# Patient Record
Sex: Male | Born: 1950 | Race: Black or African American | Hispanic: No | Marital: Married | Smoking: Former smoker
Health system: Southern US, Community
[De-identification: ages and names within clinical notes are randomized; demographics above are authoritative.]

## PROBLEM LIST (undated history)

## (undated) DIAGNOSIS — L910 Hypertrophic scar: Secondary | ICD-10-CM

## (undated) DIAGNOSIS — I10 Essential (primary) hypertension: Secondary | ICD-10-CM

## (undated) DIAGNOSIS — I739 Peripheral vascular disease, unspecified: Secondary | ICD-10-CM

## (undated) DIAGNOSIS — D489 Neoplasm of uncertain behavior, unspecified: Secondary | ICD-10-CM

## (undated) DIAGNOSIS — I509 Heart failure, unspecified: Secondary | ICD-10-CM

## (undated) DIAGNOSIS — N189 Chronic kidney disease, unspecified: Secondary | ICD-10-CM

## (undated) DIAGNOSIS — Z992 Dependence on renal dialysis: Secondary | ICD-10-CM

## (undated) DIAGNOSIS — I219 Acute myocardial infarction, unspecified: Secondary | ICD-10-CM

## (undated) DIAGNOSIS — R809 Proteinuria, unspecified: Secondary | ICD-10-CM

## (undated) DIAGNOSIS — E119 Type 2 diabetes mellitus without complications: Secondary | ICD-10-CM

## (undated) DIAGNOSIS — I129 Hypertensive chronic kidney disease with stage 1 through stage 4 chronic kidney disease, or unspecified chronic kidney disease: Secondary | ICD-10-CM

## (undated) DIAGNOSIS — A0472 Enterocolitis due to Clostridium difficile, not specified as recurrent: Secondary | ICD-10-CM

## (undated) DIAGNOSIS — I639 Cerebral infarction, unspecified: Secondary | ICD-10-CM

## (undated) DIAGNOSIS — E559 Vitamin D deficiency, unspecified: Secondary | ICD-10-CM

## (undated) DIAGNOSIS — I2510 Atherosclerotic heart disease of native coronary artery without angina pectoris: Secondary | ICD-10-CM

## (undated) DIAGNOSIS — N529 Male erectile dysfunction, unspecified: Secondary | ICD-10-CM

## (undated) DIAGNOSIS — N289 Disorder of kidney and ureter, unspecified: Secondary | ICD-10-CM

## (undated) DIAGNOSIS — E785 Hyperlipidemia, unspecified: Secondary | ICD-10-CM

## (undated) HISTORY — PX: CORONARY ANGIOPLASTY: SHX604

## (undated) HISTORY — DX: Hypertrophic scar: L91.0

## (undated) HISTORY — DX: Atherosclerotic heart disease of native coronary artery without angina pectoris: I25.10

## (undated) HISTORY — DX: Chronic kidney disease, unspecified: N18.9

## (undated) HISTORY — DX: Neoplasm of uncertain behavior, unspecified: D48.9

## (undated) HISTORY — DX: Hyperlipidemia, unspecified: E78.5

## (undated) HISTORY — DX: Type 2 diabetes mellitus without complications: E11.9

## (undated) HISTORY — DX: Peripheral vascular disease, unspecified: I73.9

## (undated) HISTORY — DX: Male erectile dysfunction, unspecified: N52.9

## (undated) HISTORY — DX: Hypertensive chronic kidney disease with stage 1 through stage 4 chronic kidney disease, or unspecified chronic kidney disease: I12.9

## (undated) HISTORY — DX: Proteinuria, unspecified: R80.9

## (undated) HISTORY — DX: Vitamin D deficiency, unspecified: E55.9

## (undated) HISTORY — PX: OTHER SURGICAL HISTORY: SHX169

## (undated) HISTORY — DX: Essential (primary) hypertension: I10

---

## 2004-06-11 DIAGNOSIS — I219 Acute myocardial infarction, unspecified: Secondary | ICD-10-CM

## 2004-06-11 HISTORY — DX: Acute myocardial infarction, unspecified: I21.9

## 2004-06-11 HISTORY — PX: CORONARY ARTERY BYPASS GRAFT: SHX141

## 2004-12-09 HISTORY — PX: CARDIAC CATHETERIZATION: SHX172

## 2004-12-26 ENCOUNTER — Inpatient Hospital Stay: Payer: Self-pay | Admitting: Internal Medicine

## 2004-12-27 ENCOUNTER — Inpatient Hospital Stay (HOSPITAL_COMMUNITY)
Admission: AD | Admit: 2004-12-27 | Discharge: 2005-01-08 | Payer: Self-pay | Admitting: Thoracic Surgery (Cardiothoracic Vascular Surgery)

## 2005-01-15 ENCOUNTER — Encounter
Admission: RE | Admit: 2005-01-15 | Discharge: 2005-01-15 | Payer: Self-pay | Admitting: Thoracic Surgery (Cardiothoracic Vascular Surgery)

## 2005-01-30 ENCOUNTER — Ambulatory Visit: Payer: Self-pay

## 2005-01-30 ENCOUNTER — Encounter: Payer: Self-pay | Admitting: Internal Medicine

## 2005-02-09 ENCOUNTER — Encounter: Payer: Self-pay | Admitting: Internal Medicine

## 2005-02-09 ENCOUNTER — Ambulatory Visit: Payer: Self-pay

## 2005-03-11 ENCOUNTER — Ambulatory Visit: Payer: Self-pay

## 2005-03-11 ENCOUNTER — Encounter: Payer: Self-pay | Admitting: Internal Medicine

## 2005-04-11 ENCOUNTER — Encounter: Payer: Self-pay | Admitting: Internal Medicine

## 2005-05-11 ENCOUNTER — Encounter: Payer: Self-pay | Admitting: Internal Medicine

## 2005-10-30 ENCOUNTER — Ambulatory Visit: Payer: Self-pay

## 2008-09-09 HISTORY — PX: CARDIAC CATHETERIZATION: SHX172

## 2008-10-05 ENCOUNTER — Ambulatory Visit: Payer: Self-pay | Admitting: Internal Medicine

## 2008-10-07 ENCOUNTER — Ambulatory Visit: Payer: Self-pay | Admitting: Vascular Surgery

## 2009-07-13 ENCOUNTER — Ambulatory Visit: Payer: Self-pay | Admitting: Internal Medicine

## 2010-06-19 DIAGNOSIS — R809 Proteinuria, unspecified: Secondary | ICD-10-CM | POA: Insufficient documentation

## 2010-06-19 DIAGNOSIS — E119 Type 2 diabetes mellitus without complications: Secondary | ICD-10-CM | POA: Insufficient documentation

## 2010-07-02 ENCOUNTER — Encounter: Payer: Self-pay | Admitting: Thoracic Surgery (Cardiothoracic Vascular Surgery)

## 2012-04-03 ENCOUNTER — Encounter: Payer: Self-pay | Admitting: *Deleted

## 2012-04-10 ENCOUNTER — Ambulatory Visit (INDEPENDENT_AMBULATORY_CARE_PROVIDER_SITE_OTHER): Payer: Medicare Other | Admitting: Cardiovascular Disease

## 2012-04-10 ENCOUNTER — Encounter: Payer: Self-pay | Admitting: Cardiovascular Disease

## 2012-04-10 VITALS — BP 132/84 | HR 63 | Ht 72.0 in | Wt 264.8 lb

## 2012-04-10 DIAGNOSIS — E785 Hyperlipidemia, unspecified: Secondary | ICD-10-CM

## 2012-04-10 DIAGNOSIS — I10 Essential (primary) hypertension: Secondary | ICD-10-CM

## 2012-04-10 DIAGNOSIS — I2510 Atherosclerotic heart disease of native coronary artery without angina pectoris: Secondary | ICD-10-CM | POA: Insufficient documentation

## 2012-04-10 DIAGNOSIS — R079 Chest pain, unspecified: Secondary | ICD-10-CM

## 2012-04-10 DIAGNOSIS — R0602 Shortness of breath: Secondary | ICD-10-CM

## 2012-04-10 DIAGNOSIS — I739 Peripheral vascular disease, unspecified: Secondary | ICD-10-CM

## 2012-04-10 NOTE — Assessment & Plan Note (Signed)
I recommend a target LDL of less than 70. He is currently on Crestor 20 mg daily.

## 2012-04-10 NOTE — Progress Notes (Signed)
HPI  This is a 61 year old African American male who is here today to establish cardiovascular care. He use to followup with Dr. Gwen Pounds that he is requesting to transfer care. He has known history of coronary artery disease status post non-ST elevation myocardial infarction in 2006. Cardiac catheterization at that time showed significant three-vessel coronary artery disease. He underwent coronary artery bypass graft surgery. Most recent cardiac catheterization in 2010 showed patent grafts except SVG to diagonal which was occluded. Ejection fraction was normal. The patient has multiple other medical conditions that include hypertension, stage IV chronic kidney disease, diabetes, hyperlipidemia and obesity. His been having occasional chest pain described as sharp discomfort and occasional tightness mostly at rest and not with physical activities. The patient does not exercise on a regular basis. He takes his medications regularly. He has not had any ischemic cardiac evaluation since 2010.  No Known Allergies   Current Outpatient Prescriptions on File Prior to Visit  Medication Sig Dispense Refill  . amLODipine (NORVASC) 10 MG tablet Take 10 mg by mouth daily.      Marland Kitchen aspirin 81 MG tablet Take 81 mg by mouth daily.      . chlorthalidone (HYGROTON) 25 MG tablet Take 25 mg by mouth daily.      . Cholecalciferol (VITAMIN D3) 50000 UNITS CAPS Take by mouth every 30 (thirty) days.      . insulin glargine (LANTUS) 100 UNIT/ML injection Inject into the skin. Takes 80 units at bedtimed;Titrate 4 units every 4 days unitl FBG are in the 130s.      . Insulin Pen Needle (PRODIGY MINI PEN NEEDLES) 31G X 5 MM MISC by Does not apply route as directed.      Marland Kitchen lisinopril (PRINIVIL,ZESTRIL) 40 MG tablet Take 40 mg by mouth daily.      . metoprolol (LOPRESSOR) 100 MG tablet Take 100 mg by mouth 2 (two) times daily.      . rosuvastatin (CRESTOR) 20 MG tablet Take 20 mg by mouth daily.      . sildenafil (VIAGRA) 50  MG tablet Takes 1-2 tablets as needed.         Past Medical History  Diagnosis Date  . Diabetes mellitus without complication     type I  . Unspecified vitamin D deficiency   . Neoplasm of uncertain behavior, site unspecified   . Peripheral vascular disease, unspecified   . Impotence of organic origin   . Keloid scar   . Proteinuria   . Coronary atherosclerosis of unspecified type of vessel, native or graft     Myocardial infarction in 2006. Cardiac catheterization showed significant three-vessel coronary artery disease. He underwent CABG at [facility]. Most recent cardiac catheterization in 2010 showed normal LV systolic function, patent grafts to OM1, RCA and LAD. Occluded SVG to diagonal.  . Hyperlipidemia   . Hypertension   . Hypertension, renal disease   . CKD (chronic kidney disease)      Past Surgical History  Procedure Date  . Keloid removal     right neck  . Cardiac catheterization 12/2004    [facility]  . Cardiac catheterization 09/2008    [facility]  . Coronary artery bypass graft 2006    [facility]     Family History  Problem Relation Age of Onset  . Hypertension Mother   . Hyperlipidemia Mother   . Heart disease Mother   . Heart disease Father   . Hyperlipidemia Father   . Hypertension Father  History   Social History  . Marital Status: Married    Spouse Name: N/A    Number of Children: N/A  . Years of Education: N/A   Occupational History  . Not on file.   Social History Main Topics  . Smoking status: Former Smoker -- 0.5 packs/day for 0 years    Types: Cigarettes  . Smokeless tobacco: Not on file  . Alcohol Use: Yes     socially  . Drug Use: No  . Sexually Active:    Other Topics Concern  . Not on file   Social History Narrative  . No narrative on file     ROS Constitutional: Negative for fever, chills, diaphoresis, activity change, appetite change and fatigue.  HENT: Negative for hearing loss, nosebleeds, congestion, sore  throat, facial swelling, drooling, trouble swallowing, neck pain, voice change, sinus pressure and tinnitus.  Eyes: Negative for photophobia, pain, discharge and visual disturbance.  Respiratory: Negative for apnea, cough and wheezing.  Cardiovascular: Negative for palpitations and leg swelling.  Gastrointestinal: Negative for nausea, vomiting, abdominal pain, diarrhea, constipation, blood in stool and abdominal distention.  Genitourinary: Negative for dysuria, urgency, frequency, hematuria and decreased urine volume.  Musculoskeletal: Negative for myalgias, back pain, joint swelling, arthralgias and gait problem.  Skin: Negative for color change, pallor, rash and wound.  Neurological: Negative for dizziness, tremors, seizures, syncope, speech difficulty, weakness, light-headedness, numbness and headaches.  Psychiatric/Behavioral: Negative for suicidal ideas, hallucinations, behavioral problems and agitation. The patient is not nervous/anxious.     PHYSICAL EXAM   BP 132/84  Pulse 63  Ht 6' (1.829 m)  Wt 264 lb 12 oz (120.09 kg)  BMI 35.91 kg/m2 Constitutional: He is oriented to person, place, and time. He appears well-developed and well-nourished. No distress.  HENT: No nasal discharge.  Head: Normocephalic and atraumatic.  Eyes: Pupils are equal and round. Right eye exhibits no discharge. Left eye exhibits no discharge.  Neck: Normal range of motion. Neck supple. No JVD present. No thyromegaly present.  Cardiovascular: Normal rate, regular rhythm, normal heart sounds and. Exam reveals no gallop and no friction rub. No murmur heard. Significant keloid tissue at the surgical scar. Pulmonary/Chest: Effort normal and breath sounds normal. No stridor. No respiratory distress. He has no wheezes. He has no rales. He exhibits no tenderness.  Abdominal: Soft. Bowel sounds are normal. He exhibits no distension. There is no tenderness. There is no rebound and no guarding.  Musculoskeletal: Normal  range of motion. He exhibits +1 edema and no tenderness.  Neurological: He is alert and oriented to person, place, and time. Coordination normal.  Skin: Skin is warm and dry. No rash noted. He is not diaphoretic. No erythema. No pallor.  Psychiatric: He has a normal mood and affect. His behavior is normal. Judgment and thought content normal.       [id number]:[id number]  Rhythm  -Nonspecific QRS widening and anterior fascicular block.   -Nonspecific ST depression   +   Nonspecific T-abnormality  -Nondiagnostic.   ABNORMAL    ASSESSMENT AND PLAN

## 2012-04-10 NOTE — Patient Instructions (Addendum)
Your physician has requested that you have a lexiscan myoview. For further information please visit [email or web address] Please follow instruction sheet, as given.  Follow up after stress test

## 2012-04-10 NOTE — Assessment & Plan Note (Addendum)
The patient has no history of coronary artery disease status post CABG. Currently he complains of atypical chest pain. He also has multiple chronic medical conditions. I recommend further evaluation with a pharmacologic nuclear stress test. The patient is not able to exercise on a treadmill due to his peripheral arterial disease and leg discomfort. Continue aggressive medical therapy. Given his advanced chronic kidney disease, cardiac catheterization would be reserved only for highly abnormal stress test.

## 2012-04-10 NOTE — Assessment & Plan Note (Signed)
He reports previous stenting in the right lower extremity without improvement in symptoms. He is being followed by Dr. Wyn Quaker.

## 2012-04-10 NOTE — Assessment & Plan Note (Signed)
His blood pressure is well controlled. Continue current medications. 

## 2012-04-14 ENCOUNTER — Telehealth: Payer: Self-pay | Admitting: Cardiovascular Disease

## 2012-04-14 NOTE — Telephone Encounter (Signed)
Pt wants to cancel stress test for 11/7

## 2012-04-14 NOTE — Telephone Encounter (Signed)
Pt wishes to cancel lexi Says he is seeing Dr. Gwen Pounds as well and is going to continue seeing him versus Dr. Kirke Corin Will cx stress test

## 2012-04-21 ENCOUNTER — Ambulatory Visit: Payer: Medicare Other | Admitting: Cardiovascular Disease

## 2012-08-28 DIAGNOSIS — E669 Obesity, unspecified: Secondary | ICD-10-CM | POA: Insufficient documentation

## 2013-03-30 DIAGNOSIS — D649 Anemia, unspecified: Secondary | ICD-10-CM | POA: Insufficient documentation

## 2013-03-30 DIAGNOSIS — E559 Vitamin D deficiency, unspecified: Secondary | ICD-10-CM | POA: Insufficient documentation

## 2014-07-24 DIAGNOSIS — I340 Nonrheumatic mitral (valve) insufficiency: Secondary | ICD-10-CM | POA: Insufficient documentation

## 2014-07-24 DIAGNOSIS — I6529 Occlusion and stenosis of unspecified carotid artery: Secondary | ICD-10-CM | POA: Insufficient documentation

## 2014-07-24 DIAGNOSIS — I119 Hypertensive heart disease without heart failure: Secondary | ICD-10-CM | POA: Insufficient documentation

## 2014-07-24 DIAGNOSIS — I6523 Occlusion and stenosis of bilateral carotid arteries: Secondary | ICD-10-CM | POA: Insufficient documentation

## 2014-12-14 ENCOUNTER — Encounter: Payer: Self-pay | Admitting: Emergency Medicine

## 2014-12-14 ENCOUNTER — Emergency Department
Admission: EM | Admit: 2014-12-14 | Discharge: 2014-12-14 | Disposition: A | Discharge status: Home / Self Care | Payer: Medicare HMO | Attending: Emergency Medicine | Admitting: Emergency Medicine

## 2014-12-14 DIAGNOSIS — I129 Hypertensive chronic kidney disease with stage 1 through stage 4 chronic kidney disease, or unspecified chronic kidney disease: Secondary | ICD-10-CM | POA: Insufficient documentation

## 2014-12-14 DIAGNOSIS — Z79899 Other long term (current) drug therapy: Secondary | ICD-10-CM | POA: Diagnosis not present

## 2014-12-14 DIAGNOSIS — Z7982 Long term (current) use of aspirin: Secondary | ICD-10-CM | POA: Insufficient documentation

## 2014-12-14 DIAGNOSIS — Z794 Long term (current) use of insulin: Secondary | ICD-10-CM | POA: Diagnosis not present

## 2014-12-14 DIAGNOSIS — E119 Type 2 diabetes mellitus without complications: Secondary | ICD-10-CM | POA: Diagnosis not present

## 2014-12-14 DIAGNOSIS — N189 Chronic kidney disease, unspecified: Secondary | ICD-10-CM | POA: Diagnosis not present

## 2014-12-14 DIAGNOSIS — M10361 Gout due to renal impairment, right knee: Secondary | ICD-10-CM | POA: Diagnosis not present

## 2014-12-14 DIAGNOSIS — Z87891 Personal history of nicotine dependence: Secondary | ICD-10-CM | POA: Insufficient documentation

## 2014-12-14 DIAGNOSIS — M25561 Pain in right knee: Secondary | ICD-10-CM | POA: Diagnosis present

## 2014-12-14 LAB — BASIC METABOLIC PANEL
Anion gap: 10 (ref 5–15)
BUN: 41 mg/dL — AB (ref 6–20)
CALCIUM: 9.2 mg/dL (ref 8.9–10.3)
CO2: 23 mmol/L (ref 22–32)
Chloride: 106 mmol/L (ref 101–111)
Creatinine, Ser: 5.01 mg/dL — ABNORMAL HIGH (ref 0.61–1.24)
GFR, EST AFRICAN AMERICAN: 13 mL/min — AB (ref >60)
GFR, EST NON AFRICAN AMERICAN: 11 mL/min — AB (ref >60)
Glucose, Bld: 257 mg/dL — ABNORMAL HIGH (ref 65–99)
Potassium: 3.9 mmol/L (ref 3.5–5.1)
Sodium: 139 mmol/L (ref 135–145)

## 2014-12-14 LAB — CBC WITH DIFFERENTIAL/PLATELET
BASOS ABS: 0.1 10*3/uL (ref 0–0.1)
BASOS PCT: 1 %
EOS PCT: 2 %
Eosinophils Absolute: 0.2 10*3/uL (ref 0–0.7)
HEMATOCRIT: 36.2 % — AB (ref 40.0–52.0)
Hemoglobin: 11.8 g/dL — ABNORMAL LOW (ref 13.0–18.0)
Lymphocytes Relative: 15 %
Lymphs Abs: 1.3 10*3/uL (ref 1.0–3.6)
MCH: 27.1 pg (ref 26.0–34.0)
MCHC: 32.7 g/dL (ref 32.0–36.0)
MCV: 82.7 fL (ref 80.0–100.0)
MONO ABS: 0.5 10*3/uL (ref 0.2–1.0)
MONOS PCT: 6 %
Neutro Abs: 6.4 10*3/uL (ref 1.4–6.5)
Neutrophils Relative %: 76 %
Platelets: 198 10*3/uL (ref 150–440)
RBC: 4.37 MIL/uL — ABNORMAL LOW (ref 4.40–5.90)
RDW: 14.3 % (ref 11.5–14.5)
WBC: 8.4 10*3/uL (ref 3.8–10.6)

## 2014-12-14 LAB — URIC ACID: URIC ACID, SERUM: 7.1 mg/dL (ref 4.4–7.6)

## 2014-12-14 MED ORDER — HYDROCODONE-ACETAMINOPHEN 5-325 MG PO TABS: 1.0000 | ORAL_TABLET | Freq: Four times a day (QID) | ORAL | Status: DC | PRN

## 2014-12-14 NOTE — ED Provider Notes (Signed)
[facility] Emergency Department Provider Note ____________________________________________  Time seen: 1524  I have reviewed the triage vital signs and the nursing notes.  HISTORY  Chief Complaint  Knee Pain  HPI Terry Tyler is a 64 y.o. male visits to the ED for evaluation and management of sudden onset of right knee pain. He notes the onset was on Saturday, denies any trauma including fall, near fall, or previous history of knee pain. He rates his pain at a 10 out of 10 currently, and has not taken any medications for relief since onset. His past medical history significant for hypertension, MI, and chronic kidney disease. He denies a history of gout, rheumatoid, or OA.  Past Medical History  Diagnosis Date  . Diabetes mellitus without complication     type I  . Unspecified vitamin D deficiency   . Neoplasm of uncertain behavior, site unspecified   . Peripheral vascular disease, unspecified   . Impotence of organic origin   . Keloid scar   . Proteinuria   . Coronary atherosclerosis of unspecified type of vessel, native or graft     Myocardial infarction in 2006. Cardiac catheterization showed significant three-vessel coronary artery disease. He underwent CABG at [facility]. Most recent cardiac catheterization in 2010 showed normal LV systolic function, patent grafts to OM1, RCA and LAD. Occluded SVG to diagonal.  . Hyperlipidemia   . Hypertension   . Hypertension, renal disease   . CKD (chronic kidney disease)     Patient Active Problem List   Diagnosis Date Noted  . PAD (peripheral artery disease) 04/10/2012  . Coronary atherosclerosis of unspecified type of vessel, native or graft   . Hyperlipidemia   . Hypertension     Past Surgical History  Procedure Laterality Date  . Keloid removal      right neck  . Cardiac catheterization  12/2004    [facility]  . Cardiac catheterization  09/2008    [facility]  . Coronary artery bypass  graft  2006    [facility]    Current Outpatient Rx  Name  Route  Sig  Dispense  Refill  . amLODipine (NORVASC) 10 MG tablet   Oral   Take 10 mg by mouth daily.         Marland Kitchen aspirin 81 MG tablet   Oral   Take 81 mg by mouth daily.         . chlorthalidone (HYGROTON) 25 MG tablet   Oral   Take 25 mg by mouth daily.         . Cholecalciferol (VITAMIN D3) 50000 UNITS CAPS   Oral   Take by mouth every 30 (thirty) days.         Marland Kitchen HYDROcodone-acetaminophen (NORCO) 5-325 MG per tablet   Oral   Take 1-2 tablets by mouth every 6 (six) hours as needed for moderate pain.   15 tablet   0   . insulin glargine (LANTUS) 100 UNIT/ML injection   Subcutaneous   Inject into the skin. Takes 80 units at bedtimed;Titrate 4 units every 4 days unitl FBG are in the 130s.         . Insulin Pen Needle (PRODIGY MINI PEN NEEDLES) 31G X 5 MM MISC   Does not apply   by Does not apply route as directed.         Marland Kitchen lisinopril (PRINIVIL,ZESTRIL) 40 MG tablet   Oral   Take 40 mg by mouth daily.         Marland Kitchen  metoprolol (LOPRESSOR) 100 MG tablet   Oral   Take 100 mg by mouth 2 (two) times daily.         . rosuvastatin (CRESTOR) 20 MG tablet   Oral   Take 20 mg by mouth daily.         . sildenafil (VIAGRA) 50 MG tablet      Takes 1-2 tablets as needed.          Allergies Review of patient's allergies indicates no known allergies.  Family History  Problem Relation Age of Onset  . Hypertension Mother   . Hyperlipidemia Mother   . Heart disease Mother   . Heart disease Father   . Hyperlipidemia Father   . Hypertension Father     Social History History  Substance Use Topics  . Smoking status: Former Smoker -- 0.50 packs/day for 0 years    Types: Cigarettes  . Smokeless tobacco: Not on file  . Alcohol Use: No     Comment: socially   Review of Systems  Constitutional: Negative for fever. Eyes: Negative for visual changes. ENT: Negative for sore throat. Cardiovascular:  Negative for chest pain. Respiratory: Negative for shortness of breath. Gastrointestinal: Negative for abdominal pain, vomiting and diarrhea. Genitourinary: Negative for dysuria. Musculoskeletal: Negative for back pain. Right knee pain as above. Skin: Negative for rash. Neurological: Negative for headaches, focal weakness or numbness. ____________________________________________  PHYSICAL EXAM:  VITAL SIGNS: ED Triage Vitals  Enc Vitals Group     BP 12/14/14 1453 178/103 mmHg     Pulse Rate 12/14/14 1453 87     Resp 12/14/14 1453 18     Temp 12/14/14 1453 98.3 F (36.8 C)     Temp Source 12/14/14 1453 Oral     SpO2 12/14/14 1453 100 %     Weight 12/14/14 1453 260 lb (117.935 kg)     Height 12/14/14 1453 6' (1.829 m)     Head Cir --      Peak Flow --      Pain Score 12/14/14 1455 10     Pain Loc --      Pain Edu? --      Excl. in [place]? --    Constitutional: Alert and oriented. Well appearing and in no distress. HEENT: Normocephalic and atraumatic. Conjunctivae are normal. PERRL. Normal extraocular movements. Mucous membranes are moist. Cardiovascular: Normal rate, regular rhythm. Normal distal pulses. Respiratory: Normal respiratory effort. No wheezes/rales/rhonchi. Gastrointestinal: Soft and nontender. No distention. Musculoskeletal: Right knee without deformity, but noticeable erythema, warmth, and decreased AROM. No calf or achilles tenderness. No effusion or patella laxity. Neurologic:  Normal gait without ataxia. Normal speech and language. No gross focal neurologic deficits are appreciated. Skin:  Skin is warm, dry and intact. No rash noted. Psychiatric: Mood and affect are normal. Patient exhibits appropriate insight and judgment. ____________________________________________    LABS (pertinent positives/negatives) Labs Reviewed  CBC WITH DIFFERENTIAL/PLATELET - Abnormal; Notable for the following:    RBC 4.37 (*)    Hemoglobin 11.8 (*)    HCT 36.2 (*)    All other  components within normal limits  BASIC METABOLIC PANEL - Abnormal; Notable for the following:    Glucose, Bld 257 (*)    BUN 41 (*)    Creatinine, Ser 5.01 (*)    GFR calc non Af Amer 11 (*)    GFR calc Af Amer 13 (*)    All other components within normal limits  URIC ACID  ____________________________________________  INITIAL IMPRESSION /  ASSESSMENT AND PLAN / ED COURSE  Lab results to patient, high normal uric acid noted. Suggest treatment with Norco #15 and ice. Follow-up with primary provider for clinical suspicion of acute gout.  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

## 2014-12-14 NOTE — Discharge Instructions (Signed)

## 2014-12-14 NOTE — ED Notes (Addendum)
Patient reports pain to right knee since Saturday. Denies any injury. Patient has h/o HTN, just took medication PTA.

## 2015-05-31 DIAGNOSIS — N184 Chronic kidney disease, stage 4 (severe): Secondary | ICD-10-CM | POA: Insufficient documentation

## 2015-06-09 ENCOUNTER — Inpatient Hospital Stay: Admission: RE | Admit: 2015-06-09 | Payer: Medicare Other | Source: Ambulatory Visit

## 2015-06-10 ENCOUNTER — Other Ambulatory Visit: Payer: Medicare Other

## 2015-06-16 ENCOUNTER — Other Ambulatory Visit: Payer: Medicare Other

## 2015-06-17 ENCOUNTER — Ambulatory Visit: Admission: RE | Admit: 2015-06-17 | Payer: Medicare HMO | Source: Ambulatory Visit | Admitting: Vascular Surgery

## 2015-06-17 ENCOUNTER — Encounter: Admission: RE | Payer: Self-pay | Source: Ambulatory Visit

## 2015-06-17 SURGERY — LAPAROSCOPIC INSERTION CONTINUOUS AMBULATORY PERITONEAL DIALYSIS  (CAPD) CATHETER
Anesthesia: General

## 2015-08-09 DIAGNOSIS — N2581 Secondary hyperparathyroidism of renal origin: Secondary | ICD-10-CM | POA: Insufficient documentation

## 2016-02-14 ENCOUNTER — Ambulatory Visit: Admit: 2016-02-14 | Payer: Medicare Other | Admitting: Gastroenterology

## 2016-02-14 SURGERY — COLONOSCOPY WITH PROPOFOL
Anesthesia: General

## 2016-04-09 ENCOUNTER — Emergency Department: Payer: Medicare Other

## 2016-04-09 ENCOUNTER — Encounter: Payer: Self-pay | Admitting: Emergency Medicine

## 2016-04-09 ENCOUNTER — Inpatient Hospital Stay
Admission: EM | Admit: 2016-04-09 | Discharge: 2016-04-11 | DRG: 065 | Disposition: A | Discharge status: Home / Self Care | Payer: Medicare Other | Attending: Internal Medicine | Admitting: Internal Medicine

## 2016-04-09 DIAGNOSIS — I639 Cerebral infarction, unspecified: Secondary | ICD-10-CM | POA: Diagnosis not present

## 2016-04-09 DIAGNOSIS — R2681 Unsteadiness on feet: Secondary | ICD-10-CM

## 2016-04-09 DIAGNOSIS — Z794 Long term (current) use of insulin: Secondary | ICD-10-CM | POA: Diagnosis not present

## 2016-04-09 DIAGNOSIS — Z8249 Family history of ischemic heart disease and other diseases of the circulatory system: Secondary | ICD-10-CM | POA: Diagnosis not present

## 2016-04-09 DIAGNOSIS — Z951 Presence of aortocoronary bypass graft: Secondary | ICD-10-CM | POA: Diagnosis not present

## 2016-04-09 DIAGNOSIS — Z7982 Long term (current) use of aspirin: Secondary | ICD-10-CM | POA: Diagnosis not present

## 2016-04-09 DIAGNOSIS — E1151 Type 2 diabetes mellitus with diabetic peripheral angiopathy without gangrene: Secondary | ICD-10-CM | POA: Diagnosis not present

## 2016-04-09 DIAGNOSIS — G629 Polyneuropathy, unspecified: Secondary | ICD-10-CM | POA: Diagnosis present

## 2016-04-09 DIAGNOSIS — M25552 Pain in left hip: Secondary | ICD-10-CM

## 2016-04-09 DIAGNOSIS — R262 Difficulty in walking, not elsewhere classified: Secondary | ICD-10-CM | POA: Diagnosis present

## 2016-04-09 DIAGNOSIS — M6281 Muscle weakness (generalized): Secondary | ICD-10-CM

## 2016-04-09 DIAGNOSIS — R29700 NIHSS score 0: Secondary | ICD-10-CM | POA: Diagnosis not present

## 2016-04-09 DIAGNOSIS — I120 Hypertensive chronic kidney disease with stage 5 chronic kidney disease or end stage renal disease: Secondary | ICD-10-CM | POA: Diagnosis present

## 2016-04-09 DIAGNOSIS — Z9114 Patient's other noncompliance with medication regimen: Secondary | ICD-10-CM

## 2016-04-09 DIAGNOSIS — N185 Chronic kidney disease, stage 5: Secondary | ICD-10-CM | POA: Diagnosis not present

## 2016-04-09 DIAGNOSIS — E785 Hyperlipidemia, unspecified: Secondary | ICD-10-CM | POA: Diagnosis present

## 2016-04-09 DIAGNOSIS — Z87891 Personal history of nicotine dependence: Secondary | ICD-10-CM

## 2016-04-09 DIAGNOSIS — R29702 NIHSS score 2: Secondary | ICD-10-CM | POA: Diagnosis not present

## 2016-04-09 DIAGNOSIS — I2510 Atherosclerotic heart disease of native coronary artery without angina pectoris: Secondary | ICD-10-CM | POA: Diagnosis present

## 2016-04-09 DIAGNOSIS — E1122 Type 2 diabetes mellitus with diabetic chronic kidney disease: Secondary | ICD-10-CM | POA: Diagnosis not present

## 2016-04-09 DIAGNOSIS — N2581 Secondary hyperparathyroidism of renal origin: Secondary | ICD-10-CM | POA: Diagnosis not present

## 2016-04-09 DIAGNOSIS — I252 Old myocardial infarction: Secondary | ICD-10-CM | POA: Diagnosis not present

## 2016-04-09 DIAGNOSIS — R531 Weakness: Secondary | ICD-10-CM

## 2016-04-09 LAB — CBC
HEMATOCRIT: 30.6 % — AB (ref 40.0–52.0)
Hemoglobin: 10.1 g/dL — ABNORMAL LOW (ref 13.0–18.0)
MCH: 26.9 pg (ref 26.0–34.0)
MCHC: 32.9 g/dL (ref 32.0–36.0)
MCV: 81.8 fL (ref 80.0–100.0)
Platelets: 169 10*3/uL (ref 150–440)
RBC: 3.74 MIL/uL — ABNORMAL LOW (ref 4.40–5.90)
RDW: 16.1 % — ABNORMAL HIGH (ref 11.5–14.5)
WBC: 7.6 10*3/uL (ref 3.8–10.6)

## 2016-04-09 LAB — URINALYSIS COMPLETE WITH MICROSCOPIC (ARMC ONLY)
BACTERIA UA: NONE SEEN
Bilirubin Urine: NEGATIVE
Glucose, UA: >500 mg/dL — AB
Ketones, ur: NEGATIVE mg/dL
LEUKOCYTES UA: NEGATIVE
NITRITE: NEGATIVE
PH: 6 (ref 5.0–8.0)
Protein, ur: >500 mg/dL — AB
SPECIFIC GRAVITY, URINE: 1.007 (ref 1.005–1.030)
SQUAMOUS EPITHELIAL / LPF: NONE SEEN

## 2016-04-09 LAB — BASIC METABOLIC PANEL
Anion gap: 10 (ref 5–15)
BUN: 54 mg/dL — ABNORMAL HIGH (ref 6–20)
CO2: 20 mmol/L — ABNORMAL LOW (ref 22–32)
Calcium: 10 mg/dL (ref 8.9–10.3)
Chloride: 105 mmol/L (ref 101–111)
Creatinine, Ser: 7.93 mg/dL — ABNORMAL HIGH (ref 0.61–1.24)
GFR calc Af Amer: 7 mL/min — ABNORMAL LOW (ref >60)
GFR, EST NON AFRICAN AMERICAN: 6 mL/min — AB (ref >60)
Glucose, Bld: 208 mg/dL — ABNORMAL HIGH (ref 65–99)
Potassium: 3.8 mmol/L (ref 3.5–5.1)
SODIUM: 135 mmol/L (ref 135–145)

## 2016-04-09 LAB — PROTIME-INR
INR: 1.02
PROTHROMBIN TIME: 13.4 s (ref 11.4–15.2)

## 2016-04-09 LAB — GLUCOSE, CAPILLARY: Glucose-Capillary: 173 mg/dL — ABNORMAL HIGH (ref 65–99)

## 2016-04-09 MED ORDER — ASPIRIN 81 MG PO TABS: 325.0000 mg | ORAL_TABLET | Freq: Every day | ORAL | Status: DC

## 2016-04-09 MED ORDER — HYDRALAZINE HCL 50 MG PO TABS
50.0000 mg | ORAL_TABLET | Freq: Two times a day (BID) | ORAL | Status: DC
  Administered 2016-04-10: 50 mg via ORAL
  Filled 2016-04-09: qty 1

## 2016-04-09 MED ORDER — HYDRALAZINE HCL 20 MG/ML IJ SOLN
INTRAMUSCULAR | Status: AC
  Administered 2016-04-09: 10 mg via INTRAVENOUS
  Filled 2016-04-09: qty 1

## 2016-04-09 MED ORDER — ASPIRIN 325 MG PO TABS
325.0000 mg | ORAL_TABLET | Freq: Every day | ORAL | Status: DC
  Administered 2016-04-10: 09:00:00 325 mg via ORAL
  Filled 2016-04-09: qty 1

## 2016-04-09 MED ORDER — HYDRALAZINE HCL 20 MG/ML IJ SOLN
10.0000 mg | Freq: Once | INTRAMUSCULAR | Status: AC
  Administered 2016-04-09: 10 mg via INTRAVENOUS

## 2016-04-09 NOTE — ED Notes (Signed)
Pt had no difficulty with stroke swallow screen.

## 2016-04-09 NOTE — ED Notes (Signed)
Hospitalist paged in regard to elevated BP. Pt reports he has taken one dose of hydralazine and daily dose of Lisinopril.

## 2016-04-09 NOTE — ED Notes (Signed)
MD aware of pt's BP.  

## 2016-04-09 NOTE — ED Triage Notes (Signed)
Pt was sent by PCP. Pt fell last night, denies hitting head or LOC. Pt feeling weak since Saturday

## 2016-04-09 NOTE — ED Provider Notes (Signed)
[facility] Emergency Department Provider Note  ____________________________________________   I have reviewed the triage vital signs and the nursing notes.   HISTORY  Chief Complaint Fall    HPI Terry Tyler is a 65 y.o. male patient with history of early end-stage renal disease he still makes urine but has a creatinine between 6 and 7 usually according to his daughter. Patient states that he has been walking with a cane since Saturday. He woke up on Saturday and had right lower extremity weakness and as well as a difficulty of using his right hand. He is right-hand dominant. He could not write. He is having scribbling. His right hand feels like is nearly back to baseline but he still, when he walks, is having right lower extremity weakness which makes him EDU Z cane. He did fall today. He did not hit his head. He denies any difficulty speaking or headache. Denies any dysuria or urinary frequency. He has chronic left lower leg sciatic kind of pain but this is a painless weakness in his right leg.      Past Medical History:  Diagnosis Date  . CKD (chronic kidney disease)   . Coronary atherosclerosis of unspecified type of vessel, native or graft    Myocardial infarction in 2006. Cardiac catheterization showed significant three-vessel coronary artery disease. He underwent CABG at [facility]. Most recent cardiac catheterization in 2010 showed normal LV systolic function, patent grafts to OM1, RCA and LAD. Occluded SVG to diagonal.  . 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 Hyperlipidemia   . Hypertension   . Hypertension, renal disease   . Impotence of organic origin   . Keloid scar   . Neoplasm of uncertain behavior, site unspecified   . Peripheral vascular disease, unspecified   . Proteinuria   . Unspecified vitamin D deficiency     Patient Active Problem List   Diagnosis Date Noted  . PAD (peripheral artery disease) ([place]) 04/10/2012   . Coronary atherosclerosis of unspecified type of vessel, native or graft   . Hyperlipidemia   . Hypertension     Past Surgical History:  Procedure Laterality Date  . CARDIAC CATHETERIZATION  12/2004   [facility]  . CARDIAC CATHETERIZATION  09/2008   [facility]  . CORONARY ARTERY BYPASS GRAFT  2006   [facility]  . keloid removal     right neck    Prior to Admission medications   Medication Sig Start Date End Date Taking? Authorizing Provider  amLODipine (NORVASC) 10 MG tablet Take 10 mg by mouth daily.    Historical Provider, MD  aspirin 81 MG tablet Take 81 mg by mouth daily.    Historical Provider, MD  chlorthalidone (HYGROTON) 25 MG tablet Take 25 mg by mouth daily.    Historical Provider, MD  Cholecalciferol (VITAMIN D3) 50000 UNITS CAPS Take by mouth every 30 (thirty) days.    Historical Provider, MD  HYDROcodone-acetaminophen (NORCO) 5-325 MG per tablet Take 1-2 tablets by mouth every 6 (six) hours as needed for moderate pain. 12/14/14   Jenise V Bacon Menshew, PA-C  insulin glargine (LANTUS) 100 UNIT/ML injection Inject into the skin. Takes 80 units at bedtimed;Titrate 4 units every 4 days unitl FBG are in the 130s.    Historical Provider, MD  Insulin Pen Needle (PRODIGY MINI PEN NEEDLES) 31G X 5 MM MISC by Does not apply route as directed.    Historical Provider, MD  lisinopril (PRINIVIL,ZESTRIL) 40 MG tablet Take 40  mg by mouth daily.    Historical Provider, MD  metoprolol (LOPRESSOR) 100 MG tablet Take 100 mg by mouth 2 (two) times daily.    Historical Provider, MD  rosuvastatin (CRESTOR) 20 MG tablet Take 20 mg by mouth daily.    Historical Provider, MD  sildenafil (VIAGRA) 50 MG tablet Takes 1-2 tablets as needed.    Historical Provider, MD    Allergies Lipitor [atorvastatin]  Family History  Problem Relation Age of Onset  . Hypertension Mother   . Hyperlipidemia Mother   . Heart disease Mother   . Heart disease Father   . Hyperlipidemia Father   . Hypertension  Father     Social History Social History  Substance Use Topics  . Smoking status: Former Smoker    Packs/day: 0.50    Years: 0.00    Types: Cigarettes  . Smokeless tobacco: Never Used  . Alcohol use No     Comment: socially    Review of Systems Constitutional: No fever/chills Eyes: No visual changes. ENT: No sore throat. No stiff neck no neck pain Cardiovascular: Denies chest pain. Respiratory: Denies shortness of breath. Gastrointestinal:   no vomiting.  No diarrhea.  No constipation. Genitourinary: Negative for dysuria. Musculoskeletal: Negative lower extremity swelling Skin: Negative for rash. Neurological: Negative for severe headaches, focal weakness or numbness. 10-point ROS otherwise negative.  ____________________________________________   PHYSICAL EXAM:  VITAL SIGNS: ED Triage Vitals  Enc Vitals Group     BP 04/09/16 1546 (!) 195/79     Pulse Rate 04/09/16 1546 62     Resp 04/09/16 1546 16     Temp 04/09/16 1546 98.3 F (36.8 C)     Temp Source 04/09/16 1546 Oral     SpO2 04/09/16 1546 100 %     Weight 04/09/16 1543 253 lb (114.8 kg)     Height 04/09/16 1543 6' (1.829 m)     Head Circumference --      Peak Flow --      Pain Score 04/09/16 1543 0     Pain Loc --      Pain Edu? --      Excl. in [place]? --     Constitutional: Alert and oriented. Well appearing and in no acute distress. Eyes: Conjunctivae are normal. PERRL. EOMI. Head: Atraumatic. Nose: No congestion/rhinnorhea. Mouth/Throat: Mucous membranes are moist.  Oropharynx non-erythematous. Neck: No stridor.   Nontender with no meningismus Cardiovascular: Normal rate, regular rhythm. Grossly normal heart sounds.  Good peripheral circulation. Respiratory: Normal respiratory effort.  No retractions. Lungs CTAB. Abdominal: Soft and nontender. No distention. No guarding no rebound Back:  There is no focal tenderness or step off.  there is no midline tenderness there are no lesions noted. there is no  CVA tenderness Musculoskeletal: No lower extremity tenderness, no upper extremity tenderness. No joint effusions, no DVT signs strong distal pulses no edema Neurologic:  Patient with no obvious drift, cranial nerves II through XII are grossly intact, sensation seems to be equal in all extremities, he has very slight right lower showed a weakness versus left but he is still 4+ to 5 area he is somewhat limited in his ability to lift up his left leg because of his chronic left leg pain so to somewhat difficult to tell there is a significant difference. Speech is normal. Skin:  Skin is warm, dry and intact. No rash noted. Psychiatric: Mood and affect are normal. Speech and behavior are normal.  ____________________________________________   LABS (all labs  ordered are listed, but only abnormal results are displayed)  Labs Reviewed  BASIC METABOLIC PANEL - Abnormal; Notable for the following:       Result Value   CO2 20 (*)    Glucose, Bld 208 (*)    BUN 54 (*)    Creatinine, Ser 7.93 (*)    GFR calc non Af Amer 6 (*)    GFR calc Af Amer 7 (*)    All other components within normal limits  CBC - Abnormal; Notable for the following:    RBC 3.74 (*)    Hemoglobin 10.1 (*)    HCT 30.6 (*)    RDW 16.1 (*)    All other components within normal limits  URINALYSIS COMPLETEWITH MICROSCOPIC ([facility] ONLY)  CBG MONITORING, ED   ____________________________________________  EKG  I personally interpreted any EKGs ordered by me or triage Normal sinus rhythm rate 65 bpm no acute ST elevation or depression nonspecific ST changes, ____________________________________________  RADIOLOGY  I reviewed any imaging ordered by me or triage that were performed during my shift and, if possible, patient and/or family made aware of any abnormal findings. ____________________________________________   PROCEDURES  Procedure(s) performed: None  Procedures  Critical Care performed:  None  ____________________________________________   INITIAL IMPRESSION / ASSESSMENT AND PLAN / ED COURSE  Pertinent labs & imaging results that were available during my care of the patient were reviewed by me and considered in my medical decision making (see chart for details).  Patient with symptoms concerning for a CVA over the weekend. Not a candidate for TPA given that it has been 2 days. Patient had a non-syncopal fall in no obvious injury today. CT is negative. The patient likely will require admission for further evaluation  Clinical Course   ____________________________________________   FINAL CLINICAL IMPRESSION(S) / ED DIAGNOSES  Final diagnoses:  None      This chart was dictated using voice recognition software.  Despite best efforts to proofread,  errors can occur which can change meaning.      Schuyler Amor, MD 04/09/16 [phone]

## 2016-04-10 ENCOUNTER — Inpatient Hospital Stay
Admit: 2016-04-10 | Discharge: 2016-04-10 | Disposition: A | Discharge status: Home / Self Care | Payer: Medicare Other | Attending: Family Medicine | Admitting: Family Medicine

## 2016-04-10 ENCOUNTER — Inpatient Hospital Stay: Payer: Medicare Other

## 2016-04-10 DIAGNOSIS — R531 Weakness: Secondary | ICD-10-CM | POA: Diagnosis not present

## 2016-04-10 DIAGNOSIS — I639 Cerebral infarction, unspecified: Principal | ICD-10-CM

## 2016-04-10 LAB — GLUCOSE, CAPILLARY
GLUCOSE-CAPILLARY: 121 mg/dL — AB (ref 65–99)
GLUCOSE-CAPILLARY: 159 mg/dL — AB (ref 65–99)
GLUCOSE-CAPILLARY: 176 mg/dL — AB (ref 65–99)
GLUCOSE-CAPILLARY: 183 mg/dL — AB (ref 65–99)
GLUCOSE-CAPILLARY: 65 mg/dL (ref 65–99)
Glucose-Capillary: 93 mg/dL (ref 65–99)

## 2016-04-10 LAB — URINE DRUG SCREEN, QUALITATIVE (ARMC ONLY)
AMPHETAMINES, UR SCREEN: NOT DETECTED
BENZODIAZEPINE, UR SCRN: NOT DETECTED
Barbiturates, Ur Screen: NOT DETECTED
COCAINE METABOLITE, UR ~~LOC~~: NOT DETECTED
Cannabinoid 50 Ng, Ur ~~LOC~~: NOT DETECTED
MDMA (Ecstasy)Ur Screen: NOT DETECTED
METHADONE SCREEN, URINE: NOT DETECTED
OPIATE, UR SCREEN: NOT DETECTED
PHENCYCLIDINE (PCP) UR S: NOT DETECTED
Tricyclic, Ur Screen: NOT DETECTED

## 2016-04-10 LAB — LIPID PANEL
Cholesterol: 96 mg/dL (ref 0–200)
HDL: 37 mg/dL — ABNORMAL LOW (ref >40)
LDL CALC: 47 mg/dL (ref 0–99)
TRIGLYCERIDES: 62 mg/dL (ref <150)
Total CHOL/HDL Ratio: 2.6 RATIO
VLDL: 12 mg/dL (ref 0–40)

## 2016-04-10 LAB — BASIC METABOLIC PANEL
Anion gap: 6 (ref 5–15)
BUN: 55 mg/dL — AB (ref 6–20)
CALCIUM: 9.8 mg/dL (ref 8.9–10.3)
CHLORIDE: 113 mmol/L — AB (ref 101–111)
CO2: 21 mmol/L — AB (ref 22–32)
CREATININE: 7.65 mg/dL — AB (ref 0.61–1.24)
GFR calc non Af Amer: 7 mL/min — ABNORMAL LOW (ref >60)
GFR, EST AFRICAN AMERICAN: 8 mL/min — AB (ref >60)
GLUCOSE: 63 mg/dL — AB (ref 65–99)
Potassium: 3.6 mmol/L (ref 3.5–5.1)
Sodium: 140 mmol/L (ref 135–145)

## 2016-04-10 LAB — CBC
HCT: 28.9 % — ABNORMAL LOW (ref 40.0–52.0)
Hemoglobin: 9.4 g/dL — ABNORMAL LOW (ref 13.0–18.0)
MCH: 26.7 pg (ref 26.0–34.0)
MCHC: 32.7 g/dL (ref 32.0–36.0)
MCV: 81.7 fL (ref 80.0–100.0)
PLATELETS: 179 10*3/uL (ref 150–440)
RBC: 3.54 MIL/uL — AB (ref 4.40–5.90)
RDW: 16.2 % — ABNORMAL HIGH (ref 11.5–14.5)
WBC: 7.5 10*3/uL (ref 3.8–10.6)

## 2016-04-10 LAB — PHOSPHORUS: PHOSPHORUS: 4.7 mg/dL — AB (ref 2.5–4.6)

## 2016-04-10 LAB — MAGNESIUM: Magnesium: 1.9 mg/dL (ref 1.7–2.4)

## 2016-04-10 LAB — TROPONIN I
TROPONIN I: 0.05 ng/mL — AB (ref <0.03)
TROPONIN I: 0.06 ng/mL — AB (ref <0.03)
TROPONIN I: 0.05 ng/mL — AB (ref <0.03)
Troponin I: 0.05 ng/mL (ref <0.03)

## 2016-04-10 LAB — TSH: TSH: 1.497 u[IU]/mL (ref 0.350–4.500)

## 2016-04-10 MED ORDER — SODIUM CHLORIDE 0.9 % IV SOLN: INTRAVENOUS | Status: DC

## 2016-04-10 MED ORDER — MAGNESIUM CITRATE PO SOLN
1.0000 | Freq: Once | ORAL | Status: DC | PRN
  Filled 2016-04-10: qty 296

## 2016-04-10 MED ORDER — INSULIN ASPART 100 UNIT/ML ~~LOC~~ SOLN
0.0000 [IU] | SUBCUTANEOUS | Status: DC
  Administered 2016-04-10: 01:00:00 4 [IU] via SUBCUTANEOUS
  Administered 2016-04-10: 04:00:00 3 [IU] via SUBCUTANEOUS
  Administered 2016-04-10: 13:00:00 4 [IU] via SUBCUTANEOUS
  Filled 2016-04-10: qty 4
  Filled 2016-04-10: qty 3
  Filled 2016-04-10: qty 4

## 2016-04-10 MED ORDER — INSULIN ASPART 100 UNIT/ML ~~LOC~~ SOLN: 0.0000 [IU] | Freq: Every day | SUBCUTANEOUS | Status: DC

## 2016-04-10 MED ORDER — ROSUVASTATIN CALCIUM 5 MG PO TABS
20.0000 mg | ORAL_TABLET | Freq: Every day | ORAL | Status: DC
  Administered 2016-04-10: 20 mg via ORAL
  Filled 2016-04-10: qty 4

## 2016-04-10 MED ORDER — ACETAMINOPHEN 325 MG PO TABS: 650.0000 mg | ORAL_TABLET | Freq: Four times a day (QID) | ORAL | Status: DC | PRN

## 2016-04-10 MED ORDER — LISINOPRIL 20 MG PO TABS
40.0000 mg | ORAL_TABLET | Freq: Every day | ORAL | Status: DC
  Administered 2016-04-10 – 2016-04-11 (×2): 40 mg via ORAL
  Filled 2016-04-10 (×2): qty 2

## 2016-04-10 MED ORDER — ONDANSETRON HCL 4 MG PO TABS: 4.0000 mg | ORAL_TABLET | Freq: Four times a day (QID) | ORAL | Status: DC | PRN

## 2016-04-10 MED ORDER — ZOLPIDEM TARTRATE 5 MG PO TABS: 5.0000 mg | ORAL_TABLET | Freq: Every evening | ORAL | Status: DC | PRN

## 2016-04-10 MED ORDER — BISACODYL 5 MG PO TBEC: 5.0000 mg | DELAYED_RELEASE_TABLET | Freq: Every day | ORAL | Status: DC | PRN

## 2016-04-10 MED ORDER — HEPARIN SODIUM (PORCINE) 5000 UNIT/ML IJ SOLN
5000.0000 [IU] | Freq: Three times a day (TID) | INTRAMUSCULAR | Status: DC
  Administered 2016-04-10 – 2016-04-11 (×4): 5000 [IU] via SUBCUTANEOUS
  Filled 2016-04-10 (×4): qty 1

## 2016-04-10 MED ORDER — SENNOSIDES-DOCUSATE SODIUM 8.6-50 MG PO TABS
1.0000 | ORAL_TABLET | Freq: Every evening | ORAL | Status: DC | PRN
  Administered 2016-04-10: 09:00:00 1 via ORAL
  Filled 2016-04-10: qty 1

## 2016-04-10 MED ORDER — INSULIN ASPART 100 UNIT/ML ~~LOC~~ SOLN
0.0000 [IU] | Freq: Three times a day (TID) | SUBCUTANEOUS | Status: DC
  Administered 2016-04-10: 18:00:00 4 [IU] via SUBCUTANEOUS
  Administered 2016-04-11: 10:00:00 3 [IU] via SUBCUTANEOUS
  Filled 2016-04-10: qty 3
  Filled 2016-04-10: qty 4

## 2016-04-10 MED ORDER — CLOPIDOGREL BISULFATE 75 MG PO TABS
75.0000 mg | ORAL_TABLET | Freq: Every day | ORAL | Status: DC
  Administered 2016-04-10 – 2016-04-11 (×2): 75 mg via ORAL
  Filled 2016-04-10 (×2): qty 1

## 2016-04-10 MED ORDER — INSULIN GLARGINE 100 UNIT/ML ~~LOC~~ SOLN
50.0000 [IU] | Freq: Two times a day (BID) | SUBCUTANEOUS | Status: DC
  Filled 2016-04-10 (×4): qty 0.5

## 2016-04-10 MED ORDER — HYDROCODONE-ACETAMINOPHEN 5-325 MG PO TABS: 1.0000 | ORAL_TABLET | ORAL | Status: DC | PRN

## 2016-04-10 MED ORDER — ACETAMINOPHEN 650 MG RE SUPP
650.0000 mg | Freq: Four times a day (QID) | RECTAL | Status: DC | PRN
Start: 2016-04-10 — End: 2016-04-11

## 2016-04-10 MED ORDER — ONDANSETRON HCL 4 MG/2ML IJ SOLN: 4.0000 mg | Freq: Four times a day (QID) | INTRAMUSCULAR | Status: DC | PRN

## 2016-04-10 MED ORDER — VITAMIN D3 1.25 MG (50000 UT) PO CAPS: 1.0000 | ORAL_CAPSULE | ORAL | Status: DC

## 2016-04-10 MED ORDER — STROKE: EARLY STAGES OF RECOVERY BOOK
Freq: Once | Status: AC
Start: 2016-04-10 — End: 2016-04-10
  Administered 2016-04-10: 01:00:00

## 2016-04-10 MED ORDER — VITAMIN D (ERGOCALCIFEROL) 1.25 MG (50000 UNIT) PO CAPS
50000.0000 [IU] | ORAL_CAPSULE | ORAL | Status: DC
  Administered 2016-04-10: 50000 [IU] via ORAL
  Filled 2016-04-10: qty 1

## 2016-04-10 MED ORDER — HYDRALAZINE HCL 50 MG PO TABS
50.0000 mg | ORAL_TABLET | Freq: Four times a day (QID) | ORAL | Status: DC
  Administered 2016-04-10 – 2016-04-11 (×3): 50 mg via ORAL
  Filled 2016-04-10 (×3): qty 1

## 2016-04-10 MED ORDER — FUROSEMIDE 40 MG PO TABS
40.0000 mg | ORAL_TABLET | Freq: Two times a day (BID) | ORAL | Status: DC
  Administered 2016-04-10 – 2016-04-11 (×3): 40 mg via ORAL
  Filled 2016-04-10 (×3): qty 1

## 2016-04-10 MED ORDER — ASPIRIN EC 81 MG PO TBEC
81.0000 mg | DELAYED_RELEASE_TABLET | Freq: Every day | ORAL | Status: DC
  Administered 2016-04-11: 81 mg via ORAL
  Filled 2016-04-10: qty 1

## 2016-04-10 MED ORDER — METOPROLOL SUCCINATE ER 25 MG PO TB24
25.0000 mg | ORAL_TABLET | Freq: Every day | ORAL | Status: DC
  Administered 2016-04-10 – 2016-04-11 (×2): 25 mg via ORAL
  Filled 2016-04-10 (×2): qty 1

## 2016-04-10 MED ORDER — SODIUM CHLORIDE 0.9% FLUSH
3.0000 mL | Freq: Two times a day (BID) | INTRAVENOUS | Status: DC
  Administered 2016-04-10 (×3): 3 mL via INTRAVENOUS

## 2016-04-10 MED ORDER — GABAPENTIN 300 MG PO CAPS
300.0000 mg | ORAL_CAPSULE | Freq: Three times a day (TID) | ORAL | Status: DC
  Administered 2016-04-10 – 2016-04-11 (×4): 300 mg via ORAL
  Filled 2016-04-10 (×4): qty 1

## 2016-04-10 MED ORDER — CALCITRIOL 0.25 MCG PO CAPS
0.2500 ug | ORAL_CAPSULE | ORAL | Status: DC
  Administered 2016-04-10: 09:00:00 0.25 ug via ORAL
  Filled 2016-04-10: qty 1

## 2016-04-10 NOTE — Progress Notes (Signed)
Inpatient Diabetes Program Recommendations  AACE/ADA: New Consensus Statement on Inpatient Glycemic Control (2015)  Target Ranges:  Prepandial:   less than 140 mg/dL      Peak postprandial:   less than 180 mg/dL (1-2 hours)      Critically ill patients:  140 - 180 mg/dL   Lab Results  Component Value Date   GLUCAP 183 (H) 04/10/2016    Review of Glycemic Control  Results for Sliger, EMITT BURGET (MRN [id number]) as of 04/10/2016 14:43  Ref. Range 04/10/2016 00:46 04/10/2016 04:18 04/10/2016 07:51 04/10/2016 08:20 04/10/2016 12:27  Glucose-Capillary Latest Ref Range: 65 - 99 mg/dL 176 (H) 121 (H) 65 93 183 (H)    Diabetes history: Type 2 Outpatient Diabetes medications: confirmed with patient; No Lantus x 2 wks, Novolog 10-15 units tid with meals at 11am and 5pm   Current orders for Inpatient glycemic control: Lantus 50 units qhs, Novolog 0-20 units tid, Novolog 0-5 units qhs  Inpatient Diabetes Program Recommendations:  Please consider changing Lantus to 11 units qhs (0.1 unit/kg), Novolog 5 units tid and Novolog sensitive correction tid.  Continue Novolog 0-5 units qhs  Spoke to patient at the bedside- he has confirmed medications as above.  Patient of Dr. Lavera Guise.  Has been in the donut hole since July so he's trying to spread his medications out.  MD does not know he stopped Lantus- patient said he stopped taking Lantus because he kept going too low.  He has 2 Lantus pens and 1 Novolog pen left. I have asked him to call MD to get another insulin pen and to reach out to [facility] to see if they can assist.    Gentry Fitz, RN, BA, MHA, CDE Diabetes Coordinator Inpatient Diabetes Program  [phone] (Team Pager) [phone] ([place]) 04/10/2016 2:48 PM

## 2016-04-10 NOTE — Progress Notes (Signed)
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 MD acknowleged, no new orders given.

## 2016-04-10 NOTE — Evaluation (Signed)
Physical Therapy Evaluation Patient Details Name: Terry Tyler MRN: [id number] DOB: 1950/08/10 Today's Date: 04/10/2016   History of Present Illness  Pt is a 65 y.o. male with a history of Coronary artery disease status post MI and CABG in 2006, chronic kidney disease-V, hypertension, hyperlipidemia came in with right-sided weakness and found to have an acute/subacute L pons CVA.  Clinical Impression  Pt presents with deficits in strength, transfers, mobility, gait, balance, and activity tolerance.  Pt with noted RLE weakness and deficits with RAMs compared to LLE with no gross deficits to light touch or proprioception. Pt required SBA with extra time and effort as well as use of bed rail with all bed mobility tasks and was CGA with transfers.  Pt able to amb 50-75' with RW and CGA with min instability and decreased stance time on RLE but no LOB.  Pt will benefit from PT services to address above deficits for decreased caregiver assistance and fall risk upon discharge.      Follow Up Recommendations Home health PT    Equipment Recommendations  Rolling walker with 5" wheels    Recommendations for Other Services       Precautions / Restrictions Precautions Precautions: Fall Restrictions Weight Bearing Restrictions: No      Mobility  Bed Mobility Overal bed mobility: Needs Assistance Bed Mobility: Supine to Sit;Sit to Supine     Supine to sit: Supervision     General bed mobility comments: SBA with bed mobility with use of bed rails  Transfers Overall transfer level: Needs assistance Equipment used: Rolling walker (2 wheeled) Transfers: Sit to/from Stand Sit to Stand: Min guard         General transfer comment: Min-mod verbal cues for hand placement  Ambulation/Gait Ambulation/Gait assistance: Min guard   Assistive device: Rolling walker (2 wheeled) Gait Pattern/deviations: Decreased step length - left;Trunk flexed   Gait velocity interpretation: Below normal  speed for age/gender General Gait Details: Mod verbal cues to amb closer to [employer] Stairs:  (deferred)          Wheelchair Mobility    Modified Rankin (Stroke Patients Only)       Balance Overall balance assessment: Needs assistance   Sitting balance-Leahy Scale: Good     Standing balance support: Bilateral upper extremity supported Standing balance-Leahy Scale: Fair Standing balance comment: Posterior LOB during balance training without UE support                             Pertinent Vitals/Pain Pain Assessment: No/denies pain    Home Living Family/patient expects to be discharged to:: Private residence Living Arrangements: Spouse/significant other;Children Available Help at Discharge: Family Type of Home: Mobile home Home Access: Stairs to enter Entrance Stairs-Rails: Right;Left;Can reach both Entrance Stairs-Number of Steps: 4 Home Layout: One level Home Equipment: None      Prior Function Level of Independence: Independent               Hand Dominance   Dominant Hand: Right    Extremity/Trunk Assessment   Upper Extremity Assessment: Defer to OT evaluation           Lower Extremity Assessment: Generalized weakness (RLE general weakness)         Communication   Communication: No difficulties  Cognition Arousal/Alertness: Awake/alert Behavior During Therapy: WFL for tasks assessed/performed Overall Cognitive Status: Within Functional Limits for tasks assessed  General Comments      Exercises Total Joint Exercises Ankle Circles/Pumps: AROM;Both;10 reps Quad Sets: AROM;Both;10 reps;15 reps Gluteal Sets: AROM;Both;10 reps Towel Squeeze: AROM;Both;10 reps Long Arc Quad: Strengthening;Both;10 reps Knee Flexion: Strengthening;Both;10 reps Marching in Standing: AROM;Both;10 reps Other Exercises Other Exercises: static standing balance training with feet apart and together with combinations  of eyes open/closed and head still/head turns  HEP education/review with pt/spouse for BLE APs, QS, and GS x 10 reps each every hour or so.   Assessment/Plan    PT Assessment Patient needs continued PT services  PT Problem List Decreased strength;Decreased activity tolerance;Decreased balance;Decreased mobility          PT Treatment Interventions Gait training;Stair training;Functional mobility training;Therapeutic activities;DME instruction;Therapeutic exercise;Balance training;Neuromuscular re-education;Patient/family education    PT Goals (Current goals can be found in the Care Plan section)  Acute Rehab PT Goals Patient Stated Goal: Improved strength PT Goal Formulation: With patient Time For Goal Achievement: 04/24/16 Potential to Achieve Goals: Good    Frequency 7X/week   Barriers to discharge        Co-evaluation               End of Session Equipment Utilized During Treatment: Gait belt Activity Tolerance: Patient tolerated treatment well Patient left: in chair;with chair alarm set;with call bell/phone within reach;with family/visitor present           Time: [id number] PT Time Calculation (min) (ACUTE ONLY): 42 min   Charges:   PT Evaluation $PT Eval Low Complexity: 1 Procedure PT Treatments $Therapeutic Exercise: 23-37 mins   PT G Codes:        DRoyetta Asal PT, DPT 04/10/16, 3:43 PM

## 2016-04-10 NOTE — Progress Notes (Signed)
SLP Cancellation Note  Patient Details Name: Terry Tyler MRN: [id number] DOB: 12/06/1950   Cancelled treatment:       Reason Eval/Treat Not Completed: SLP screened, no needs identified, will sign off (Reviewed chart notes; consulted NSG and pt/wife). Pt and wife denied any swallowing deficits or speech-language deficits. Pt is communicating at conversational level w/ SLP and wife/staff; eating a full breakfast meal while in room w/out deficits noted. Pt denied any need for skilled ST services at this time. ST services are available if any change in status while admitted. NSG updated.   [name], MS, CCC-SLP  Terry Tyler 04/10/2016, 9:49 AM

## 2016-04-10 NOTE — Progress Notes (Signed)
*  PRELIMINARY RESULTS* Echocardiogram 2D Echocardiogram has been performed.  Sherrie Sport 04/10/2016, 2:34 PM

## 2016-04-10 NOTE — Progress Notes (Signed)
OT Cancellation Note  Patient Details Name: Terry Tyler MRN: [id number] DOB: 04/13/1951   Cancelled Treatment:    Reason Eval/Treat Not Completed: Medical issues which prohibited therapy (Troponin level is trending upwards. Will continue to monitor, and eval as appropriate.)  Harrel Carina, MS, OTR/L 04/10/2016, 9:38 AM

## 2016-04-10 NOTE — Care Management Important Message (Signed)
Important Message  Patient Details  Name: Terry Tyler MRN: [id number] Date of Birth: 04/30/1951   Medicare Important Message Given:  Yes    Shelbie Ammons, RN 04/10/2016, 8:25 AM

## 2016-04-10 NOTE — Progress Notes (Signed)
Critical troponin level 0.05.  MD notified.  Repeat troponin levels to be followed

## 2016-04-10 NOTE — H&P (Signed)
[place] @ [facility] Admission History and Physical Terry Tyler, D.O.  ---------------------------------------------------------------------------------------------------------------------   PATIENT NAME: Terry Tyler MR#: [id number] DATE OF BIRTH: 07/08/1950 DATE OF ADMISSION: 04/09/2016 PRIMARY CARE PHYSICIAN: Marguerita Merles, MD  REQUESTING/REFERRING PHYSICIAN: ED Dr. Burlene Arnt  CHIEF COMPLAINT: Chief Complaint  Patient presents with  . Fall    HISTORY OF PRESENT ILLNESS: Terry Tyler is a 65 y.o. male with a known history of Coronary artery disease status post MI and CABG in 2006, chronic kidney disease, hypertension, hyperlipidemia was in a usual state of health until 2 days ago when he developed right upper and lower extremity weakness. Patient states that he was having difficulty walking secondary to right lower kidney weakness but also could not make his hand right words that he wanted to write. He normally does use a cane with today's symptoms were so severe that he fell prompting an emergency Department visit. Patient states his right leg symptoms persist but his right hand symptoms are almost back to baseline.   Otherwise there has been no change in status. Patient has been taking medication as prescribed and there has been no recent change in medication or diet.  There has been no recent illness, travel or sick contacts.    Patient denies fevers/chills, weakness, dizziness, chest pain, shortness of breath, N/V/C/D, abdominal pain, dysuria/frequency, changes in mental status.   PAST MEDICAL HISTORY: Past Medical History:  Diagnosis Date  . CKD (chronic kidney disease)   . Coronary atherosclerosis of unspecified type of vessel, native or graft    Myocardial infarction in 2006. Cardiac catheterization showed significant three-vessel coronary artery disease. He underwent CABG at [facility]. Most recent cardiac catheterization in 2010 showed normal LV systolic  function, patent grafts to OM1, RCA and LAD. Occluded SVG to diagonal.  . 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 Hyperlipidemia   . Hypertension   . Hypertension, renal disease   . Impotence of organic origin   . Keloid scar   . Neoplasm of uncertain behavior, site unspecified   . Peripheral vascular disease, unspecified   . Proteinuria   . Unspecified vitamin D deficiency       PAST SURGICAL HISTORY: Past Surgical History:  Procedure Laterality Date  . CARDIAC CATHETERIZATION  12/2004   [facility];[place]  . CARDIAC CATHETERIZATION  09/2008   [facility];[place]  . CORONARY ARTERY BYPASS GRAFT  2006   [facility]  . keloid removal     right neck      SOCIAL HISTORY: Social History  Substance Use Topics  . Smoking status: Former Smoker    Packs/day: 0.50    Years: 0.00    Types: Cigarettes  . Smokeless tobacco: Never Used  . Alcohol use No     Comment: socially      FAMILY HISTORY: Family History  Problem Relation Age of Onset  . Hypertension Mother   . Hyperlipidemia Mother   . Heart disease Mother   . Heart disease Father   . Hyperlipidemia Father   . Hypertension Father      MEDICATIONS AT HOME: Prior to Admission medications   Medication Sig Start Date End Date Taking? Authorizing Provider  aspirin 81 MG tablet Take 81 mg by mouth daily.   Yes Historical Provider, MD  calcitRIOL (ROCALTROL) 0.25 MCG capsule Take 0.25 mcg by mouth every other day.   Yes Historical Provider, MD  Cholecalciferol (VITAMIN D3) 50000 UNITS CAPS Take 1 capsule by mouth once a week.  Yes Historical Provider, MD  furosemide (LASIX) 40 MG tablet Take 40 mg by mouth 2 (two) times daily.   Yes Historical Provider, MD  gabapentin (NEURONTIN) 300 MG capsule Take 300 mg by mouth 3 (three) times daily.   Yes Historical Provider, MD  hydrALAZINE (APRESOLINE) 50 MG tablet Take 50 mg by mouth 2 (two) times daily.   Yes Historical Provider, MD  insulin aspart (NOVOLOG) 100 UNIT/ML  injection Inject into the skin 3 (three) times daily before meals. Per sliding scale   Yes Historical Provider, MD  insulin glargine (LANTUS) 100 UNIT/ML injection Inject 50 Units into the skin 2 (two) times daily. Takes 80 units at bedtimed;Titrate 4 units every 4 days unitl FBG are in the 130s.    Yes Historical Provider, MD  lisinopril (PRINIVIL,ZESTRIL) 40 MG tablet Take 40 mg by mouth daily.   Yes Historical Provider, MD  metoprolol succinate (TOPROL-XL) 25 MG 24 hr tablet Take 25 mg by mouth daily.   Yes Historical Provider, MD  rosuvastatin (CRESTOR) 20 MG tablet Take 20 mg by mouth at bedtime.    Yes Historical Provider, MD  Insulin Pen Needle (PRODIGY MINI PEN NEEDLES) 31G X 5 MM MISC by Does not apply route as directed.    Historical Provider, MD  sildenafil (VIAGRA) 50 MG tablet Takes 1-2 tablets as needed.    Historical Provider, MD      DRUG ALLERGIES: Allergies  Allergen Reactions  . Lipitor [Atorvastatin] Other (See Comments)    Muscle cramping     REVIEW OF SYSTEMS: CONSTITUTIONAL: No fever/chills, fatigue, Positive weakness, negative weight gain/loss, headache EYES: No blurry or double vision. ENT: No tinnitus, postnasal drip, redness or soreness of the oropharynx. RESPIRATORY: No cough, wheeze, hemoptysis, dyspnea. CARDIOVASCULAR: No chest pain, orthopnea, palpitations, syncope. GASTROINTESTINAL: No nausea, vomiting, constipation, diarrhea, abdominal pain, hematemesis, melena or hematochezia. GENITOURINARY: No dysuria or hematuria. ENDOCRINE: No polyuria or nocturia. No heat or cold intolerance. HEMATOLOGY: No anemia, bruising, bleeding. INTEGUMENTARY: No rashes, ulcers, lesions. MUSCULOSKELETAL: No arthritis, swelling, gout. NEUROLOGIC: No numbness, tingling, positive weakness and ataxia. No seizure-type activity. PSYCHIATRIC: No anxiety, depression, insomnia.  PHYSICAL EXAMINATION: VITAL SIGNS: Blood pressure (!) 187/89, pulse 66, temperature 98.3 F (36.8 C),  temperature source Oral, resp. rate 15, height 6' (1.829 m), weight 111 kg (244 lb 11.2 oz), SpO2 100 %.  GENERAL: 65 y.o.-year-old black male patient, well-developed, well-nourished lying in the bed in no acute distress.  Pleasant and cooperative.   HEENT: Head atraumatic, normocephalic. Pupils equal, round, reactive to light and accommodation. No scleral icterus. Extraocular muscles intact.  Mucus membranes moist. NECK: Supple, full range of motion. No JVD, no bruit heard. No thyroid enlargement, no tenderness, no cervical lymphadenopathy. CHEST: Normal breath sounds bilaterally. No wheezing, rales, rhonchi or crackles. No use of accessory muscles of respiration.  No reproducible chest wall tenderness.  CARDIOVASCULAR: S1, S2 normal. No murmurs, rubs, or gallops. Cap refill <2 seconds. ABDOMEN: Soft, nontender, nondistended. No rebound, guarding, rigidity. Normoactive bowel sounds present in all four quadrants. No organomegaly or mass. EXTREMITIES: Full range of motion. No pedal edema, cyanosis, or clubbing. NEUROLOGIC: Cranial nerves II through XII are grossly intact with no focal sensorimotor deficit. Muscle strength 5/5 in all extremities. Sensation intact. Gait not checked. PSYCHIATRIC: The patient is alert and oriented x 3. Normal affect, mood, thought content. SKIN: Warm, dry, and intact without obvious rash, lesion, or ulcer.  LABORATORY PANEL:  CBC  Recent Labs Lab 04/09/16 1552  WBC 7.6  HGB  10.1*  HCT 30.6*  PLT 169   ----------------------------------------------------------------------------------------------------------------- Chemistries  Recent Labs Lab 04/09/16 1552  NA 135  K 3.8  CL 105  CO2 20*  GLUCOSE 208*  BUN 54*  CREATININE 7.93*  CALCIUM 10.0   ------------------------------------------------------------------------------------------------------------------ Cardiac Enzymes No results for input(s): TROPONINI in the last 168  hours. ------------------------------------------------------------------------------------------------------------------  RADIOLOGY: Dg Chest 2 View  Result Date: 04/09/2016 CLINICAL DATA:  Right-sided weakness. EXAM: CHEST  2 VIEW COMPARISON:  Chest radiograph 12/26/2004 FINDINGS: Median sternotomy wires, mediastinal surgical clips clips and CABG markers are noted. The cardiomediastinal silhouette is markedly enlarged. There is no pneumothorax or sizable pleural effusion. No pulmonary edema or focal airspace consolidation. There is bibasilar atelectasis. IMPRESSION: 1. Markedly enlarged cardiomediastinal silhouette, possibly pericardial effusion versus cardiomegaly alone. 2. No focal airspace disease. Electronically Signed   By: Ulyses Jarred M.D.   On: 04/09/2016 18:56   Ct Head Wo Contrast  Result Date: 04/09/2016 CLINICAL DATA:  Status post fall. No reported loss of consciousness. EXAM: CT HEAD WITHOUT CONTRAST TECHNIQUE: Contiguous axial images were obtained from the base of the skull through the vertex without intravenous contrast. COMPARISON:  None. FINDINGS: Brain: No mass lesion, intraparenchymal hemorrhage or extra-axial collection. No evidence of acute cortical infarct. Brain parenchyma and CSF-containing spaces are normal for age. Vascular: No hyperdense vessel or unexpected calcification. Skull: Normal visualized skull base, calvarium and extracranial soft tissues. Sinuses/Orbits: No sinus fluid levels or advanced mucosal thickening. No mastoid effusion. Normal orbits. IMPRESSION: Normal head CT for age. Electronically Signed   By: Ulyses Jarred M.D.   On: 04/09/2016 18:58    EKG: Normal sinus rhythm at 65 bpm with normal axis and nonspecific ST and T wave changes IMPRESSION AND PLAN:  This is a 65 y.o. male with a history of Coronary artery disease status post MI and CABG in 2006, chronic kidney disease, hypertension, hyperlipidemia  now being admitted with:  1. CVA, persistent  symptoms starting two days ago.  - Admit telemetry observation for neuro workup including: - Studies: MRA/MRI, Echo, Carotids - Labs: CBC, BMP, Lipids, TFTs, A1C - Nursing: Neurochecks, O2, dysphagia screen, permissive hypertension.  - Consults: Neurology, PT/OT, S/S consults.  - Meds: Daily aspirin 325mg .   - Fluids: IVNS@75cc /hr.   - Routine DVT Px: with Lovenox, SCDs, early ambulation 2. History of chronic kidney disease with creatinine at about baseline-we'll consult nephrology for comanagement during hospitalization. 3. History of diabetes-continue Lantus, Accu-Cheks with insulin findings, coverage every 4 hours 4. History of peripheral neuropathy-continue Neurontin 5. History of secondary hyperparathyroidism-continue Calcitrol 6. History of hypertension-continue hydralazine, lisinopril and metoprolol with permissive hypertension 7. History of hyperlipidemia-continue Crestor  Code Status: Full  All the records are reviewed and case discussed with ED provider. Management plans discussed with the patient and/or family who express understanding and agree with plan of care.   TOTAL TIME TAKING CARE OF THIS PATIENT: 60 minutes.   Nashon Erbes D.O. on 04/10/2016 at 12:42 AM Between 7am to 6pm - Pager - [phone] After 6pm go to [email or web address] - [employer]reader [facility] [place] [facility] Office [phone] CC: Primary care physician; Marguerita Merles, MD     Note: This dictation was prepared with Dragon dictation along with smaller phrase technology. Any transcriptional errors that result from this process are unintentional.

## 2016-04-10 NOTE — Consult Note (Signed)
Referring Physician: Posey Pronto    Chief Complaint: Right sided weakness  HPI: Terry Tyler is an 65 y.o. male with a history of multiple medical problems who reports that he awakened on 10/28 with right sided weakness.  Patient states that he was having difficulty walking and difficulty writing due to his weakness.  On Sunday he fell due to his weakness.  With no improvement in his symptoms patient presented for evaluation.  Initial NIHSS of 0.    Date last known well: Date: 04/06/2016 Time last known well: Time: 21:00 tPA Given: No: Outside time window  Past Medical History:  Diagnosis Date  . CKD (chronic kidney disease)   . Coronary atherosclerosis of unspecified type of vessel, native or graft    Myocardial infarction in 2006. Cardiac catheterization showed significant three-vessel coronary artery disease. He underwent CABG at [facility]. Most recent cardiac catheterization in 2010 showed normal LV systolic function, patent grafts to OM1, RCA and LAD. Occluded SVG to diagonal.  . 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 Hyperlipidemia   . Hypertension   . Hypertension, renal disease   . Impotence of organic origin   . Keloid scar   . Neoplasm of uncertain behavior, site unspecified   . Peripheral vascular disease, unspecified   . Proteinuria   . Unspecified vitamin D deficiency     Past Surgical History:  Procedure Laterality Date  . CARDIAC CATHETERIZATION  12/2004   [facility];[place]  . CARDIAC CATHETERIZATION  09/2008   [facility];[place]  . CORONARY ARTERY BYPASS GRAFT  2006   [facility]  . keloid removal     right neck    Family History  Problem Relation Age of Onset  . Hypertension Mother   . Hyperlipidemia Mother   . Heart disease Mother   . Heart disease Father   . Hyperlipidemia Father   . Hypertension Father    Social History:  reports that he has quit smoking. His smoking use included Cigarettes. He smoked 0.50 packs per day for 0.00 years. He has never  used smokeless tobacco. He reports that he does not drink alcohol or use drugs.  Allergies:  Allergies  Allergen Reactions  . Lipitor [Atorvastatin] Other (See Comments)    Muscle cramping    Medications:  I have reviewed the patient's current medications. Prior to Admission:  Prescriptions Prior to Admission  Medication Sig Dispense Refill Last Dose  . aspirin 81 MG tablet Take 81 mg by mouth daily.   04/09/2016 at 1240  . calcitRIOL (ROCALTROL) 0.25 MCG capsule Take 0.25 mcg by mouth every other day.   Past Week at Unknown time  . Cholecalciferol (VITAMIN D3) 50000 UNITS CAPS Take 1 capsule by mouth once a week.    Past Week at Unknown time  . furosemide (LASIX) 40 MG tablet Take 40 mg by mouth 2 (two) times daily.   04/09/2016 at 1240  . gabapentin (NEURONTIN) 300 MG capsule Take 300 mg by mouth 3 (three) times daily.   04/09/2016 at 1240  . hydrALAZINE (APRESOLINE) 50 MG tablet Take 50 mg by mouth 2 (two) times daily.   04/09/2016 at 1240  . insulin aspart (NOVOLOG) 100 UNIT/ML injection Inject into the skin 3 (three) times daily before meals. Per sliding scale   04/08/2016 at Unknown time  . insulin glargine (LANTUS) 100 UNIT/ML injection Inject 50 Units into the skin 2 (two) times daily. Takes 80 units at bedtimed;Titrate 4 units every 4 days unitl FBG are  in the 130s.    Past Week at Unknown time  . lisinopril (PRINIVIL,ZESTRIL) 40 MG tablet Take 40 mg by mouth daily.   04/09/2016 at 1240  . metoprolol succinate (TOPROL-XL) 25 MG 24 hr tablet Take 25 mg by mouth daily.   04/09/2016 at 1240  . rosuvastatin (CRESTOR) 20 MG tablet Take 20 mg by mouth at bedtime.    04/08/2016 at Unknown time  . Insulin Pen Needle (PRODIGY MINI PEN NEEDLES) 31G X 5 MM MISC by Does not apply route as directed.   Taking  . sildenafil (VIAGRA) 50 MG tablet Takes 1-2 tablets as needed.   prn at prn   Scheduled: . aspirin  325 mg Oral Daily  . calcitRIOL  0.25 mcg Oral QODAY  . furosemide  40 mg Oral BID   . gabapentin  300 mg Oral TID  . heparin  5,000 Units Subcutaneous Q8H  . hydrALAZINE  50 mg Oral BID  . insulin aspart  0-20 Units Subcutaneous Q4H  . insulin glargine  50 Units Subcutaneous BID  . lisinopril  40 mg Oral Daily  . metoprolol succinate  25 mg Oral Daily  . rosuvastatin  20 mg Oral QHS  . sodium chloride flush  3 mL Intravenous Q12H  . Vitamin D (Ergocalciferol)  50,000 Units Oral Q7 days    ROS: History obtained from the patient  General ROS: negative for - chills, fatigue, fever, night sweats, weight gain or weight loss Psychological ROS: negative for - behavioral disorder, hallucinations, memory difficulties, mood swings or suicidal ideation Ophthalmic ROS: negative for - blurry vision, double vision, eye pain or loss of vision ENT ROS: negative for - epistaxis, nasal discharge, oral lesions, sore throat, tinnitus or vertigo Allergy and Immunology ROS: negative for - hives or itchy/watery eyes Hematological and Lymphatic ROS: negative for - bleeding problems, bruising or swollen lymph nodes Endocrine ROS: negative for - galactorrhea, hair pattern changes, polydipsia/polyuria or temperature intolerance Respiratory ROS: negative for - cough, hemoptysis, shortness of breath or wheezing Cardiovascular ROS: negative for - chest pain, dyspnea on exertion, edema or irregular heartbeat Gastrointestinal ROS: negative for - abdominal pain, diarrhea, hematemesis, nausea/vomiting or stool incontinence Genito-Urinary ROS: negative for - dysuria, hematuria, incontinence or urinary frequency/urgency Musculoskeletal ROS: right hip pain Neurological ROS: as noted in HPI Dermatological ROS: negative for rash and skin lesion changes  Physical Examination: Blood pressure (!) 187/79, pulse 64, temperature 98.7 F (37.1 C), temperature source Oral, resp. rate 18, height 6' (1.829 m), weight 111 kg (244 lb 11.2 oz), SpO2 97 %.  HEENT-  Normocephalic, no lesions, without obvious  abnormality.  Normal external eye and conjunctiva.  Normal TM's bilaterally.  Normal auditory canals and external ears. Normal external nose, mucus membranes and septum.  Normal pharynx. Cardiovascular- S1, S2 normal, pulses palpable throughout   Lungs- chest clear, no wheezing, rales, normal symmetric air entry Abdomen- soft, non-tender; bowel sounds normal; no masses,  no organomegaly Extremities- no edema Lymph-no adenopathy palpable Musculoskeletal-no joint tenderness, deformity or swelling Skin-warm and dry, no hyperpigmentation, vitiligo, or suspicious lesions  Neurological Examination Mental Status: Alert, oriented, thought content appropriate.  Speech fluent without evidence of aphasia.  Able to follow 3 step commands without difficulty. Cranial Nerves: II: Discs flat bilaterally; Visual fields grossly normal, pupils equal, round, reactive to light and accommodation III,IV, VI: ptosis not present, extra-ocular motions intact bilaterally V,VII: decrease in right NLF, facial light touch sensation normal bilaterally VIII: hearing normal bilaterally IX,X: gag reflex present XI:  bilateral shoulder shrug XII: midline tongue extension Motor: Right : Upper extremity   5/5    Left:     Upper extremity   5/5  Lower extremity   4/5     Lower extremity   5/5 Tone and bulk:normal tone throughout; no atrophy noted Sensory: Pinprick and light touch intact throughout, bilaterally Deep Tendon Reflexes: 2+ and symmetric with absent AJ's bilaterally Plantars: Right: upgoing   Left: downgoing Cerebellar: Normal finger-to-nose and normal heel-to-shin testing bilaterally Gait: not tested due to safety concerns      Laboratory Studies:  Basic Metabolic Panel:  Recent Labs Lab 04/09/16 1552 04/10/16 0153 04/10/16 0721  NA 135  --  140  K 3.8  --  3.6  CL 105  --  113*  CO2 20*  --  21*  GLUCOSE 208*  --  63*  BUN 54*  --  55*  CREATININE 7.93*  --  7.65*  CALCIUM 10.0  --  9.8  MG   --  1.9  --   PHOS  --  4.7*  --     Liver Function Tests: No results for input(s): AST, ALT, ALKPHOS, BILITOT, PROT, ALBUMIN in the last 168 hours. No results for input(s): LIPASE, AMYLASE in the last 168 hours. No results for input(s): AMMONIA in the last 168 hours.  CBC:  Recent Labs Lab 04/09/16 1552 04/10/16 0721  WBC 7.6 7.5  HGB 10.1* 9.4*  HCT 30.6* 28.9*  MCV 81.8 81.7  PLT 169 179    Cardiac Enzymes:  Recent Labs Lab 04/10/16 0153 04/10/16 0721  TROPONINI 0.05* 0.06*    BNP: Invalid input(s): POCBNP  CBG:  Recent Labs Lab 04/09/16 2116 04/10/16 0046 04/10/16 0418 04/10/16 0751 04/10/16 0820  GLUCAP 173* 176* 121* 65 93    Microbiology: No results found for this or any previous visit.  Coagulation Studies:  Recent Labs  04/09/16 2051  LABPROT 13.4  INR 1.02    Urinalysis:  Recent Labs Lab 04/09/16 2122  COLORURINE STRAW*  LABSPEC 1.007  PHURINE 6.0  GLUCOSEU >500*  HGBUR 1+*  BILIRUBINUR NEGATIVE  KETONESUR NEGATIVE  PROTEINUR >500*  NITRITE NEGATIVE  LEUKOCYTESUR NEGATIVE    Lipid Panel:    Component Value Date/Time   CHOL 96 04/10/2016 0721   TRIG 62 04/10/2016 0721   HDL 37 (L) 04/10/2016 0721   CHOLHDL 2.6 04/10/2016 0721   VLDL 12 04/10/2016 0721   LDLCALC 47 04/10/2016 0721    HgbA1C: No results found for: HGBA1C  Urine Drug Screen:     Component Value Date/Time   LABOPIA NONE DETECTED 04/09/2016 2122   COCAINSCRNUR NONE DETECTED 04/09/2016 2122   LABBENZ NONE DETECTED 04/09/2016 2122   AMPHETMU NONE DETECTED 04/09/2016 2122   THCU NONE DETECTED 04/09/2016 2122   LABBARB NONE DETECTED 04/09/2016 2122    Alcohol Level: No results for input(s): ETH in the last 168 hours.  Other results: EKG: normal sinus rhythm at 65 bpm.  Imaging: Dg Chest 2 View  Result Date: 04/09/2016 CLINICAL DATA:  Right-sided weakness. EXAM: CHEST  2 VIEW COMPARISON:  Chest radiograph 12/26/2004 FINDINGS: Median sternotomy  wires, mediastinal surgical clips clips and CABG markers are noted. The cardiomediastinal silhouette is markedly enlarged. There is no pneumothorax or sizable pleural effusion. No pulmonary edema or focal airspace consolidation. There is bibasilar atelectasis. IMPRESSION: 1. Markedly enlarged cardiomediastinal silhouette, possibly pericardial effusion versus cardiomegaly alone. 2. No focal airspace disease. Electronically Signed   By: Cletus Gash.D.  On: 04/09/2016 18:56   Ct Head Wo Contrast  Result Date: 04/09/2016 CLINICAL DATA:  Status post fall. No reported loss of consciousness. EXAM: CT HEAD WITHOUT CONTRAST TECHNIQUE: Contiguous axial images were obtained from the base of the skull through the vertex without intravenous contrast. COMPARISON:  None. FINDINGS: Brain: No mass lesion, intraparenchymal hemorrhage or extra-axial collection. No evidence of acute cortical infarct. Brain parenchyma and CSF-containing spaces are normal for age. Vascular: No hyperdense vessel or unexpected calcification. Skull: Normal visualized skull base, calvarium and extracranial soft tissues. Sinuses/Orbits: No sinus fluid levels or advanced mucosal thickening. No mastoid effusion. Normal orbits. IMPRESSION: Normal head CT for age. Electronically Signed   By: Ulyses Jarred M.D.   On: 04/09/2016 18:58    Assessment: 65 y.o. male presenting with right sided weakness.  MRI of the brain personally reviewed and shows an acute left pontine infarct.  Etiology likely small vessel disease.  Patient with multiple vascular risk factors.  Carotid dopplers and echocardiogram pending.  A1c pending, LDL 47.  On ASA at home.    Stroke Risk Factors - diabetes mellitus, hyperlipidemia and hypertension  Plan: 1. Prophylactic therapy-Continue ASA and start Plavix 75mg  daily 2. NPO until RN stroke swallow screen 3. Telemetry monitoring 4. Frequent neuro checks 5. Continue statin therapy   Alexis Goodell,  MD Neurology [phone] 04/10/2016, 11:24 AM

## 2016-04-10 NOTE — ED Notes (Signed)
Spoke with Dr. Ara Kussmaul regarding patient's blood pressure.  Dr states that pt's bp okay for floor at this time.  Dr to make changes to orders for floor.

## 2016-04-10 NOTE — Progress Notes (Signed)
Terry Tyler at [facility] NAME: Terry Tyler    MR#:  [id number]  DATE OF BIRTH:  Feb 06, 1951  SUBJECTIVE:  Patient came in with right upper and lower extremity weakness. His much better today. Able to swallow eating regular diet. Wife in the room  REVIEW OF SYSTEMS:   Review of Systems  Constitutional: Negative for chills, fever and weight loss.  HENT: Negative for ear discharge, ear pain and nosebleeds.   Eyes: Negative for blurred vision, pain and discharge.  Respiratory: Negative for sputum production, shortness of breath, wheezing and stridor.   Cardiovascular: Negative for chest pain, palpitations, orthopnea and PND.  Gastrointestinal: Negative for abdominal pain, diarrhea, nausea and vomiting.  Genitourinary: Negative for frequency and urgency.  Musculoskeletal: Negative for back pain and joint pain.  Neurological: Positive for sensory change, focal weakness and weakness. Negative for speech change.  Psychiatric/Behavioral: Negative for depression and hallucinations. The patient is not nervous/anxious.    Tolerating Diet:yes Tolerating PT: pending  DRUG ALLERGIES:   Allergies  Allergen Reactions  . Lipitor [Atorvastatin] Other (See Comments)    Muscle cramping    VITALS:  Blood pressure (!) 184/76, pulse 64, temperature 98.7 F (37.1 C), temperature source Oral, resp. rate 16, height 6' (1.829 m), weight 111 kg (244 lb 11.2 oz), SpO2 98 %.  PHYSICAL EXAMINATION:   Physical Exam  GENERAL:  65 y.o.-year-old patient lying in the bed with no acute distress.  EYES: Pupils equal, round, reactive to light and accommodation. No scleral icterus. Extraocular muscles intact.  HEENT: Head atraumatic, normocephalic. Oropharynx and nasopharynx clear.  NECK:  Supple, no jugular venous distention. No thyroid enlargement, no tenderness.  LUNGS: Normal breath sounds bilaterally, no wheezing, rales, rhonchi. No use of accessory muscles of  respiration.  CARDIOVASCULAR: S1, S2 normal. No murmurs, rubs, or gallops.  ABDOMEN: Soft, nontender, nondistended. Bowel sounds present. No organomegaly or mass.  EXTREMITIES: No cyanosis, clubbing or edema b/l.    NEUROLOGIC: Cranial nerves II through XII are intact. No focal Motor or sensory deficits b/l.   PSYCHIATRIC:  patient is alert and oriented x 3.  SKIN: No obvious rash, lesion, or ulcer.   LABORATORY PANEL:  CBC  Recent Labs Lab 04/10/16 0721  WBC 7.5  HGB 9.4*  HCT 28.9*  PLT 179    Chemistries   Recent Labs Lab 04/10/16 0153 04/10/16 0721  NA  --  140  K  --  3.6  CL  --  113*  CO2  --  21*  GLUCOSE  --  63*  BUN  --  55*  CREATININE  --  7.65*  CALCIUM  --  9.8  MG 1.9  --    Cardiac Enzymes  Recent Labs Lab 04/10/16 1232  TROPONINI 0.05*   RADIOLOGY:  Dg Chest 2 View  Result Date: 04/09/2016 CLINICAL DATA:  Right-sided weakness. EXAM: CHEST  2 VIEW COMPARISON:  Chest radiograph 12/26/2004 FINDINGS: Median sternotomy wires, mediastinal surgical clips clips and CABG markers are noted. The cardiomediastinal silhouette is markedly enlarged. There is no pneumothorax or sizable pleural effusion. No pulmonary edema or focal airspace consolidation. There is bibasilar atelectasis. IMPRESSION: 1. Markedly enlarged cardiomediastinal silhouette, possibly pericardial effusion versus cardiomegaly alone. 2. No focal airspace disease. Electronically Signed   By: Ulyses Jarred M.D.   On: 04/09/2016 18:56   Ct Head Wo Contrast  Result Date: 04/09/2016 CLINICAL DATA:  Status post fall. No reported loss of consciousness. EXAM: CT HEAD  WITHOUT CONTRAST TECHNIQUE: Contiguous axial images were obtained from the base of the skull through the vertex without intravenous contrast. COMPARISON:  None. FINDINGS: Brain: No mass lesion, intraparenchymal hemorrhage or extra-axial collection. No evidence of acute cortical infarct. Brain parenchyma and CSF-containing spaces are  normal for age. Vascular: No hyperdense vessel or unexpected calcification. Skull: Normal visualized skull base, calvarium and extracranial soft tissues. Sinuses/Orbits: No sinus fluid levels or advanced mucosal thickening. No mastoid effusion. Normal orbits. IMPRESSION: Normal head CT for age. Electronically Signed   By: Ulyses Jarred M.D.   On: 04/09/2016 18:58   Mr Brain Wo Contrast  Result Date: 04/10/2016 CLINICAL DATA:  [place] yesterday. Right-sided weakness. Negative head CT. EXAM: MRI HEAD WITHOUT CONTRAST MRA HEAD WITHOUT CONTRAST TECHNIQUE: Multiplanar, multiecho pulse sequences of the brain and surrounding structures were obtained without intravenous contrast. Angiographic images of the head were obtained using MRA technique without contrast. COMPARISON:  Head CT 04/09/2016 FINDINGS: MRI HEAD FINDINGS Brain: There is a 1 cm acute/subacute infarction in the left side of the pons. No other acute infarction. No cerebellar insult. Cerebral hemispheres show mild chronic small-vessel ischemic changes of the white matter. There are prominent perivascular spaces diffusely. No cortical or large vessel territory infarction. No mass lesion, hemorrhage, hydrocephalus or extra-axial collection. Vascular: Major vessels at the base of the brain show flow. Skull and upper cervical spine: Negative Sinuses/Orbits: Clear/normal Other: None significant MRA HEAD FINDINGS Both internal carotid arteries are patent into the brain. There are atherosclerotic changes in the carotid siphon bilaterally. The anterior and middle cerebral vessels are patent without proximal stenosis, aneurysm or vascular malformation. Both vertebral arteries are widely patent to the basilar. No basilar stenosis. Posterior circulation branch vessels appear normal. IMPRESSION: 1 cm acute/ subacute infarction in the left side of the pons. Mild chronic small-vessel ischemic changes of the cerebral hemispheric white matter. Normal intracranial MR  angiography of the large and medium size vessels with the exception of some atherosclerotic narrowing in the carotid siphon regions bilaterally. Electronically Signed   By: Nelson Chimes M.D.   On: 04/10/2016 11:22   US Carotid Bilateral (at [facility] Only)  Result Date: 04/10/2016 CLINICAL DATA:  Cerebrovascular accident. EXAM: BILATERAL CAROTID DUPLEX ULTRASOUND TECHNIQUE: Pearline Cables scale imaging, color Doppler and duplex ultrasound were performed of bilateral carotid and vertebral arteries in the neck. COMPARISON:  Ultrasound of Oct 30, 2005. FINDINGS: Criteria: Quantification of carotid stenosis is based on velocity parameters that correlate the residual internal carotid diameter with NASCET-based stenosis levels, using the diameter of the distal internal carotid lumen as the denominator for stenosis measurement. The following velocity measurements were obtained: RIGHT ICA:  83/18 cm/sec CCA:  [id number] cm/sec SYSTOLIC ICA/CCA RATIO:  0.7 DIASTOLIC ICA/CCA RATIO:  1.7 ECA:  67 cm/sec LEFT ICA:  77/29 cm/sec CCA:  0000000 cm/sec SYSTOLIC ICA/CCA RATIO:  0.7 DIASTOLIC ICA/CCA RATIO:  1.6 ECA:  74 cm/sec RIGHT CAROTID ARTERY: No significant plaque or stenosis is noted in the right cervical carotid arteries. RIGHT VERTEBRAL ARTERY:  Antegrade flow is noted. LEFT CAROTID ARTERY: Mild eccentric plaque formation is noted in the distal left common carotid artery and left carotid bulb. No significant plaque is noted in the left internal carotid artery. LEFT VERTEBRAL ARTERY:  Antegrade flow is noted. Incidental note is made of soft tissue mass or nodule measuring 2.7 x 1.3 x 1.1 cm in the left parathyroid region. Potentially this may represent parathyroid adenoma or nodule. IMPRESSION: No significant plaque or stenosis is  noted in the right cervical carotid arteries. Mild eccentric plaque formation is noted in the distal left common carotid artery and left carotid bulb consistent with less than 50% diameter stenosis based  on ultrasound and Doppler criteria. Incidental note is made of 2.7 x 1.3 x 1.1 cm soft tissue mass or nodule in left parathyroid region. This may represent parathyroid adenoma or nodule, and clinical correlation is recommended evaluate for possible hypercalcemia. Parathyroid nuclear medicine scan may be performed further evaluation. If there is no clinical indication for parathyroid adenoma, then CT scan of the neck with contrast is recommended for further evaluation. These results will be called to the ordering clinician or representative by the Radiologist Assistant, and communication documented in the PACS or zVision Dashboard. Electronically Signed   By: Marijo Conception, M.D.   On: 04/10/2016 12:07   Mr Jodene Nam Head/brain [id number] Cm  Result Date: 04/10/2016 CLINICAL DATA:  [place] yesterday. Right-sided weakness. Negative head CT. EXAM: MRI HEAD WITHOUT CONTRAST MRA HEAD WITHOUT CONTRAST TECHNIQUE: Multiplanar, multiecho pulse sequences of the brain and surrounding structures were obtained without intravenous contrast. Angiographic images of the head were obtained using MRA technique without contrast. COMPARISON:  Head CT 04/09/2016 FINDINGS: MRI HEAD FINDINGS Brain: There is a 1 cm acute/subacute infarction in the left side of the pons. No other acute infarction. No cerebellar insult. Cerebral hemispheres show mild chronic small-vessel ischemic changes of the white matter. There are prominent perivascular spaces diffusely. No cortical or large vessel territory infarction. No mass lesion, hemorrhage, hydrocephalus or extra-axial collection. Vascular: Major vessels at the base of the brain show flow. Skull and upper cervical spine: Negative Sinuses/Orbits: Clear/normal Other: None significant MRA HEAD FINDINGS Both internal carotid arteries are patent into the brain. There are atherosclerotic changes in the carotid siphon bilaterally. The anterior and middle cerebral vessels are patent without proximal stenosis,  aneurysm or vascular malformation. Both vertebral arteries are widely patent to the basilar. No basilar stenosis. Posterior circulation branch vessels appear normal. IMPRESSION: 1 cm acute/ subacute infarction in the left side of the pons. Mild chronic small-vessel ischemic changes of the cerebral hemispheric white matter. Normal intracranial MR angiography of the large and medium size vessels with the exception of some atherosclerotic narrowing in the carotid siphon regions bilaterally. Electronically Signed   By: Nelson Chimes M.D.   On: 04/10/2016 11:22   Dg Hip Unilat With Pelvis 2-3 Views Left  Result Date: 04/10/2016 CLINICAL DATA:  Left hip pain.  Recent fall.  Initial encounter. EXAM: DG HIP (WITH OR WITHOUT PELVIS) 2-3V LEFT COMPARISON:  None. FINDINGS: There is no evidence of acute fracture or dislocation. Hip joint space widths are preserved. No suspicious osseous lesion is seen. Atherosclerotic vascular calcification is noted. IMPRESSION: No evidence of acute osseous abnormality. Electronically Signed   By: Logan Bores M.D.   On: 04/10/2016 12:18   ASSESSMENT AND PLAN:  65 y.o. male with a history of Coronary artery disease status post MI and CABG in 2006, chronic kidney disease-V, hypertension, hyperlipidemia  came in with right-sided weakness and found to have  1. Acute/subacute left pons CVA, persistent symptoms starting two days ago.  -On aspirin added Plavix by [id number] -MRI brain positive for subacute infarct left pons -Echo pending -Carotid Doppler shows mild atherosclerosis with no stenosis -PT pending  2. History of chronic kidney disease with creatinine at about baseline -patient's baseline creatinine is 8 -we'll consult nephrology for comanagement during hospitalization. -He follows  [facility] neurology and is in  the process of getting access for dialysis -Patient with a follow Dr. Candiss Norse  3. History of diabetes-continue Lantus, Accu-Cheks with insulin findings, coverage  every 4 hours -Patient has been noncompliant with his insulin. We'll follow sliding scale insulin  4. History of peripheral neuropathy-continue Neurontin  5. History of secondary hyperparathyroidism-continue Calcitrol  6. History of hypertension-continue hydralazine, lisinopril and metoprolol with permissive hypertension  7. History of hyperlipidemia-continue Crestor  8. Incidental finding of left parathyroid nodule Calcium ok. Will have w/u has outpt thru PCP  Case discussed with Care Management/Social Worker. Management plans discussed with the patient, family and they are in agreement.  CODE STATUS: full  DVT Prophylaxis:heparin TOTAL TIME TAKING CARE OF THIS PATIENT: 30 minutes.  >50% time spent on counselling and coordination of care  POSSIBLE D/C IN 1 DAYS, DEPENDING ON CLINICAL CONDITION.  Note: This dictation was prepared with Dragon dictation along with smaller phrase technology. Any transcriptional errors that result from this process are unintentional.  Meleah Demeyer M.D on 04/10/2016 at 2:55 PM  Between 7am to 6pm - Pager - [phone]  After 6pm go to [email or web address] - password [email or web address] [facility]  Office  [phone]  CC: Primary care physician; Marguerita Merles, MD

## 2016-04-10 NOTE — Progress Notes (Signed)
This RN went to patient's room due to bed alarm going off. Family at bedside assisting patient with urinal. Patient's family at bedside refuses bed alarm stating they will call if needed and notify staff if they leave so we can turn bed alarm back on.

## 2016-04-11 DIAGNOSIS — Z951 Presence of aortocoronary bypass graft: Secondary | ICD-10-CM | POA: Diagnosis not present

## 2016-04-11 DIAGNOSIS — G629 Polyneuropathy, unspecified: Secondary | ICD-10-CM | POA: Diagnosis not present

## 2016-04-11 DIAGNOSIS — N185 Chronic kidney disease, stage 5: Secondary | ICD-10-CM | POA: Diagnosis not present

## 2016-04-11 DIAGNOSIS — I252 Old myocardial infarction: Secondary | ICD-10-CM | POA: Diagnosis not present

## 2016-04-11 DIAGNOSIS — I639 Cerebral infarction, unspecified: Secondary | ICD-10-CM

## 2016-04-11 DIAGNOSIS — R262 Difficulty in walking, not elsewhere classified: Secondary | ICD-10-CM | POA: Diagnosis not present

## 2016-04-11 DIAGNOSIS — R29700 NIHSS score 0: Secondary | ICD-10-CM | POA: Diagnosis not present

## 2016-04-11 DIAGNOSIS — E785 Hyperlipidemia, unspecified: Secondary | ICD-10-CM | POA: Diagnosis not present

## 2016-04-11 DIAGNOSIS — Z794 Long term (current) use of insulin: Secondary | ICD-10-CM | POA: Diagnosis not present

## 2016-04-11 DIAGNOSIS — N2581 Secondary hyperparathyroidism of renal origin: Secondary | ICD-10-CM | POA: Diagnosis not present

## 2016-04-11 DIAGNOSIS — I2510 Atherosclerotic heart disease of native coronary artery without angina pectoris: Secondary | ICD-10-CM | POA: Diagnosis not present

## 2016-04-11 DIAGNOSIS — R531 Weakness: Secondary | ICD-10-CM | POA: Diagnosis present

## 2016-04-11 DIAGNOSIS — I120 Hypertensive chronic kidney disease with stage 5 chronic kidney disease or end stage renal disease: Secondary | ICD-10-CM | POA: Diagnosis not present

## 2016-04-11 DIAGNOSIS — Z9114 Patient's other noncompliance with medication regimen: Secondary | ICD-10-CM | POA: Diagnosis not present

## 2016-04-11 DIAGNOSIS — Z8249 Family history of ischemic heart disease and other diseases of the circulatory system: Secondary | ICD-10-CM | POA: Diagnosis not present

## 2016-04-11 DIAGNOSIS — Z7982 Long term (current) use of aspirin: Secondary | ICD-10-CM | POA: Diagnosis not present

## 2016-04-11 DIAGNOSIS — E1122 Type 2 diabetes mellitus with diabetic chronic kidney disease: Secondary | ICD-10-CM | POA: Diagnosis not present

## 2016-04-11 DIAGNOSIS — E1151 Type 2 diabetes mellitus with diabetic peripheral angiopathy without gangrene: Secondary | ICD-10-CM | POA: Diagnosis not present

## 2016-04-11 DIAGNOSIS — Z87891 Personal history of nicotine dependence: Secondary | ICD-10-CM | POA: Diagnosis not present

## 2016-04-11 HISTORY — DX: Cerebral infarction, unspecified: I63.9

## 2016-04-11 LAB — GLUCOSE, CAPILLARY: GLUCOSE-CAPILLARY: 144 mg/dL — AB (ref 65–99)

## 2016-04-11 LAB — TROPONIN I
Troponin I: 0.05 ng/mL (ref <0.03)
Troponin I: 0.06 ng/mL (ref <0.03)

## 2016-04-11 LAB — HEMOGLOBIN A1C
Hgb A1c MFr Bld: 6.8 % — ABNORMAL HIGH (ref 4.8–5.6)
Mean Plasma Glucose: 148 mg/dL

## 2016-04-11 LAB — ECHOCARDIOGRAM COMPLETE
Height: 72 in
Weight: 3915.2 oz

## 2016-04-11 MED ORDER — INSULIN GLARGINE 100 UNIT/ML ~~LOC~~ SOLN
25.0000 [IU] | Freq: Every day | SUBCUTANEOUS | Status: DC
  Filled 2016-04-11: qty 0.25

## 2016-04-11 MED ORDER — CLOPIDOGREL BISULFATE 75 MG PO TABS: 75.0000 mg | ORAL_TABLET | Freq: Every day | ORAL | 1 refills | Status: DC

## 2016-04-11 MED ORDER — INSULIN GLARGINE 100 UNIT/ML ~~LOC~~ SOLN: 25.0000 [IU] | Freq: Two times a day (BID) | SUBCUTANEOUS | Status: DC

## 2016-04-11 MED ORDER — INSULIN GLARGINE 100 UNIT/ML ~~LOC~~ SOLN
30.0000 [IU] | Freq: Two times a day (BID) | SUBCUTANEOUS | Status: DC
  Filled 2016-04-11: qty 0.3

## 2016-04-11 MED ORDER — INSULIN GLARGINE 100 UNIT/ML ~~LOC~~ SOLN: 30.0000 [IU] | Freq: Every day | SUBCUTANEOUS | Status: DC

## 2016-04-11 MED ORDER — INSULIN GLARGINE 100 UNIT/ML ~~LOC~~ SOLN: 25.0000 [IU] | Freq: Every day | SUBCUTANEOUS | 11 refills | Status: DC

## 2016-04-11 NOTE — Consult Note (Signed)
Date: 04/11/2016                  Patient Name:  Terry Tyler  MRN: [id number]  DOB: 03/06/1951  Age / Sex: 65 y.o., male         PCP: Marguerita Merles, MD                 Service Requesting Consult: Internal medicine                 Reason for Consult: chronic kidney disease stage V            History of Present Illness: Patient is a 65 y.o. male with medical problems of long-standing diabetes, coronary artery disease MI, CABG in 2006, advanced chronic kidney disease, hypertension, hyperlipidemia, who was admitted to [facility] on 04/09/2016 for evaluation of right leg weakness, fall.  He is routinely followed by [facility] nephrology/Dr. Johnney Ou. He has been monitored closely outpatient for advanced chronic kidney disease and is scheduled for AV access placement later in November.  He is also scheduled for a stress test arranged by his cardiologist.  Patient states that he has lost few pounds over the last several months.  He states food does not taste normal to him.  He also states that he is trying to lose weight to keep his blood sugars in check. Nephrology consult has been requested to evaluate for dialysis  Medications: Outpatient medications: Prescriptions Prior to Admission  Medication Sig Dispense Refill Last Dose  . aspirin 81 MG tablet Take 81 mg by mouth daily.   04/09/2016 at 1240  . calcitRIOL (ROCALTROL) 0.25 MCG capsule Take 0.25 mcg by mouth every other day.   Past Week at Unknown time  . Cholecalciferol (VITAMIN D3) 50000 UNITS CAPS Take 1 capsule by mouth once a week.    Past Week at Unknown time  . furosemide (LASIX) 40 MG tablet Take 40 mg by mouth 2 (two) times daily.   04/09/2016 at 1240  . gabapentin (NEURONTIN) 300 MG capsule Take 300 mg by mouth 3 (three) times daily.   04/09/2016 at 1240  . hydrALAZINE (APRESOLINE) 50 MG tablet Take 50 mg by mouth 2 (two) times daily.   04/09/2016 at 1240  . insulin aspart (NOVOLOG) 100 UNIT/ML injection Inject into the skin 3 (three)  times daily before meals. Per sliding scale   04/08/2016 at Unknown time  . lisinopril (PRINIVIL,ZESTRIL) 40 MG tablet Take 40 mg by mouth daily.   04/09/2016 at 1240  . metoprolol succinate (TOPROL-XL) 25 MG 24 hr tablet Take 25 mg by mouth daily.   04/09/2016 at 1240  . rosuvastatin (CRESTOR) 20 MG tablet Take 20 mg by mouth at bedtime.    04/08/2016 at Unknown time  . [DISCONTINUED] insulin glargine (LANTUS) 100 UNIT/ML injection Inject 50 Units into the skin 2 (two) times daily. Takes 80 units at bedtimed;Titrate 4 units every 4 days unitl FBG are in the 130s.    Past Week at Unknown time  . Insulin Pen Needle (PRODIGY MINI PEN NEEDLES) 31G X 5 MM MISC by Does not apply route as directed.   Taking  . sildenafil (VIAGRA) 50 MG tablet Takes 1-2 tablets as needed.   prn at prn    Current medications: Current Facility-Administered Medications  Medication Dose Route Frequency Provider Last Rate Last Dose  . acetaminophen (TYLENOL) tablet 650 mg  650 mg Oral Q6H PRN Alexis Hugelmeyer, DO       Or  .  acetaminophen (TYLENOL) suppository 650 mg  650 mg Rectal Q6H PRN Alexis Hugelmeyer, DO      . aspirin EC tablet 81 mg  81 mg Oral Daily Alexis Goodell, MD      . bisacodyl (DULCOLAX) EC tablet 5 mg  5 mg Oral Daily PRN Alexis Hugelmeyer, DO      . calcitRIOL (ROCALTROL) capsule 0.25 mcg  0.25 mcg Oral QODAY Alexis Hugelmeyer, DO   0.25 mcg at 04/10/16 0916  . clopidogrel (PLAVIX) tablet 75 mg  75 mg Oral Daily Alexis Goodell, MD   75 mg at 04/10/16 1235  . furosemide (LASIX) tablet 40 mg  40 mg Oral BID Alexis Hugelmeyer, DO   40 mg at 04/10/16 1741  . gabapentin (NEURONTIN) capsule 300 mg  300 mg Oral TID Alexis Hugelmeyer, DO   300 mg at 04/10/16 2333  . heparin injection 5,000 Units  5,000 Units Subcutaneous Q8H Alexis Hugelmeyer, DO   5,000 Units at 04/11/16 0605  . hydrALAZINE (APRESOLINE) tablet 50 mg  50 mg Oral Q6H Fritzi Mandes, MD   50 mg at 04/11/16 0614  . HYDROcodone-acetaminophen  (NORCO/VICODIN) 5-325 MG per tablet 1-2 tablet  1-2 tablet Oral Q4H PRN Alexis Hugelmeyer, DO      . insulin aspart (novoLOG) injection 0-20 Units  0-20 Units Subcutaneous TID WC Fritzi Mandes, MD   4 Units at 04/10/16 1741  . insulin aspart (novoLOG) injection 0-5 Units  0-5 Units Subcutaneous QHS Fritzi Mandes, MD      . insulin glargine (LANTUS) injection 25 Units  25 Units Subcutaneous QHS Fritzi Mandes, MD      . lisinopril (PRINIVIL,ZESTRIL) tablet 40 mg  40 mg Oral Daily Alexis Hugelmeyer, DO   40 mg at 04/10/16 0916  . magnesium citrate solution 1 Bottle  1 Bottle Oral Once PRN Alexis Hugelmeyer, DO      . metoprolol succinate (TOPROL-XL) 24 hr tablet 25 mg  25 mg Oral Daily Alexis Hugelmeyer, DO   25 mg at 04/10/16 0916  . ondansetron (ZOFRAN) tablet 4 mg  4 mg Oral Q6H PRN Alexis Hugelmeyer, DO       Or  . ondansetron (ZOFRAN) injection 4 mg  4 mg Intravenous Q6H PRN Alexis Hugelmeyer, DO      . rosuvastatin (CRESTOR) tablet 20 mg  20 mg Oral QHS Alexis Hugelmeyer, DO   20 mg at 04/10/16 2333  . senna-docusate (Senokot-S) tablet 1 tablet  1 tablet Oral QHS PRN Alexis Hugelmeyer, DO   1 tablet at 04/10/16 0916  . sodium chloride flush (NS) 0.9 % injection 3 mL  3 mL Intravenous Q12H Alexis Hugelmeyer, DO   3 mL at 04/10/16 2334  . Vitamin D (Ergocalciferol) (DRISDOL) capsule 50,000 Units  50,000 Units Oral Q7 days Alexis Hugelmeyer, DO   50,000 Units at 04/10/16 0916  . zolpidem (AMBIEN) tablet 5 mg  5 mg Oral QHS PRN,MR X 1 Alexis Hugelmeyer, DO          Allergies: Allergies  Allergen Reactions  . Lipitor [Atorvastatin] Other (See Comments)    Muscle cramping      Past Medical History: Past Medical History:  Diagnosis Date  . CKD (chronic kidney disease)   . Coronary atherosclerosis of unspecified type of vessel, native or graft    Myocardial infarction in 2006. Cardiac catheterization showed significant three-vessel coronary artery disease. He underwent CABG at [facility]. Most  recent cardiac catheterization in 2010 showed normal LV systolic function, patent grafts to OM1, RCA and LAD.  Occluded SVG to diagonal.  . 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 Hyperlipidemia   . Hypertension   . Hypertension, renal disease   . Impotence of organic origin   . Keloid scar   . Neoplasm of uncertain behavior, site unspecified   . Peripheral vascular disease, unspecified   . Proteinuria   . Unspecified vitamin D deficiency      Past Surgical History: Past Surgical History:  Procedure Laterality Date  . CARDIAC CATHETERIZATION  12/2004   [facility];[place]  . CARDIAC CATHETERIZATION  09/2008   [facility];[place]  . CORONARY ARTERY BYPASS GRAFT  2006   [facility]  . keloid removal     right neck     Family History: Family History  Problem Relation Age of Onset  . Hypertension Mother   . Hyperlipidemia Mother   . Heart disease Mother   . Heart disease Father   . Hyperlipidemia Father   . Hypertension Father      Social History: Social History   Social History  . Marital status: Married    Spouse name: N/A  . Number of children: N/A  . Years of education: N/A   Occupational History  . Not on file.   Social History Main Topics  . Smoking status: Former Smoker    Packs/day: 0.50    Years: 0.00    Types: Cigarettes  . Smokeless tobacco: Never Used  . Alcohol use No     Comment: socially  . Drug use: No  . Sexual activity: Not Currently   Other Topics Concern  . Not on file   Social History Narrative  . No narrative on file     Review of Systems: Gen: no ears or chills,induration or weight loss HEENT: no vision or hearing problems reported CV: no chest pain or shortness of breath.  He scheduled for stress test in near future Resp: no cough or sputum GI: appetite is fair.  He is trying to lose weight by eating less GU : no problems reported but voiding.  No blood in the urine MS: right leg weakness.  Back to baseline Derm:  no  complaints Psych:no complaints Heme: no complaints Neuro: no complaints Endocrine.  Diabetes.  Hemoglobin A1c improving  Vital Signs: Blood pressure (!) 156/73, pulse 65, temperature 98.2 F (36.8 C), temperature source Oral, resp. rate 18, height 6' (1.829 m), weight 111 kg (244 lb 11.2 oz), SpO2 96 %.   Intake/Output Summary (Last 24 hours) at 04/11/16 0926 Last data filed at 04/10/16 1300  Gross per 24 hour  Intake              480 ml  Output                1 ml  Net              479 ml    Weight trends: Filed Weights   04/09/16 1543 04/10/16 0033  Weight: 114.8 kg (253 lb) 111 kg (244 lb 11.2 oz)    Physical Exam: General:  no acute distress, sitting up in bed  HEENT Anicteric, moist oral mucous membranes  Neck:  supple, no masses  Lungs: Normal breathing effort, clear to auscultation  Heart::  regular, no rub or gallop  Abdomen: Soft, nontender, nondistended  Extremities:  trace edema  Neurologic: Alert, oriented  Skin: No acute rashes             Lab results: Basic Metabolic Panel:  Recent Labs Lab 04/09/16 1552 04/10/16 0153 04/10/16 0721  NA 135  --  140  K 3.8  --  3.6  CL 105  --  113*  CO2 20*  --  21*  GLUCOSE 208*  --  63*  BUN 54*  --  55*  CREATININE 7.93*  --  7.65*  CALCIUM 10.0  --  9.8  MG  --  1.9  --   PHOS  --  4.7*  --     Liver Function Tests: No results for input(s): AST, ALT, ALKPHOS, BILITOT, PROT, ALBUMIN in the last 168 hours. No results for input(s): LIPASE, AMYLASE in the last 168 hours. No results for input(s): AMMONIA in the last 168 hours.  CBC:  Recent Labs Lab 04/09/16 1552 04/10/16 0721  WBC 7.6 7.5  HGB 10.1* 9.4*  HCT 30.6* 28.9*  MCV 81.8 81.7  PLT 169 179    Cardiac Enzymes:  Recent Labs Lab 04/11/16 0620  TROPONINI 0.06*    BNP: Invalid input(s): POCBNP  CBG:  Recent Labs Lab 04/10/16 0751 04/10/16 0820 04/10/16 1227 04/10/16 1721 04/11/16 0805  GLUCAP 65 93 183* 159* 144*     Microbiology: No results found for this or any previous visit (from the past 720 hour(s)).   Coagulation Studies:  Recent Labs  04/09/16 2051  LABPROT 13.4  INR 1.02    Urinalysis:  Recent Labs  04/09/16 2122  COLORURINE STRAW*  LABSPEC 1.007  PHURINE 6.0  GLUCOSEU >500*  HGBUR 1+*  BILIRUBINUR NEGATIVE  KETONESUR NEGATIVE  PROTEINUR >500*  NITRITE NEGATIVE  LEUKOCYTESUR NEGATIVE        Imaging: Dg Chest 2 View  Result Date: 04/09/2016 CLINICAL DATA:  Right-sided weakness. EXAM: CHEST  2 VIEW COMPARISON:  Chest radiograph 12/26/2004 FINDINGS: Median sternotomy wires, mediastinal surgical clips clips and CABG markers are noted. The cardiomediastinal silhouette is markedly enlarged. There is no pneumothorax or sizable pleural effusion. No pulmonary edema or focal airspace consolidation. There is bibasilar atelectasis. IMPRESSION: 1. Markedly enlarged cardiomediastinal silhouette, possibly pericardial effusion versus cardiomegaly alone. 2. No focal airspace disease. Electronically Signed   By: Ulyses Jarred M.D.   On: 04/09/2016 18:56   Ct Head Wo Contrast  Result Date: 04/09/2016 CLINICAL DATA:  Status post fall. No reported loss of consciousness. EXAM: CT HEAD WITHOUT CONTRAST TECHNIQUE: Contiguous axial images were obtained from the base of the skull through the vertex without intravenous contrast. COMPARISON:  None. FINDINGS: Brain: No mass lesion, intraparenchymal hemorrhage or extra-axial collection. No evidence of acute cortical infarct. Brain parenchyma and CSF-containing spaces are normal for age. Vascular: No hyperdense vessel or unexpected calcification. Skull: Normal visualized skull base, calvarium and extracranial soft tissues. Sinuses/Orbits: No sinus fluid levels or advanced mucosal thickening. No mastoid effusion. Normal orbits. IMPRESSION: Normal head CT for age. Electronically Signed   By: Ulyses Jarred M.D.   On: 04/09/2016 18:58   Mr Brain Wo  Contrast  Result Date: 04/10/2016 CLINICAL DATA:  [place] yesterday. Right-sided weakness. Negative head CT. EXAM: MRI HEAD WITHOUT CONTRAST MRA HEAD WITHOUT CONTRAST TECHNIQUE: Multiplanar, multiecho pulse sequences of the brain and surrounding structures were obtained without intravenous contrast. Angiographic images of the head were obtained using MRA technique without contrast. COMPARISON:  Head CT 04/09/2016 FINDINGS: MRI HEAD FINDINGS Brain: There is a 1 cm acute/subacute infarction in the left side of the pons. No other acute infarction. No cerebellar insult. Cerebral hemispheres show mild chronic small-vessel ischemic changes of the white matter. There are  prominent perivascular spaces diffusely. No cortical or large vessel territory infarction. No mass lesion, hemorrhage, hydrocephalus or extra-axial collection. Vascular: Major vessels at the base of the brain show flow. Skull and upper cervical spine: Negative Sinuses/Orbits: Clear/normal Other: None significant MRA HEAD FINDINGS Both internal carotid arteries are patent into the brain. There are atherosclerotic changes in the carotid siphon bilaterally. The anterior and middle cerebral vessels are patent without proximal stenosis, aneurysm or vascular malformation. Both vertebral arteries are widely patent to the basilar. No basilar stenosis. Posterior circulation branch vessels appear normal. IMPRESSION: 1 cm acute/ subacute infarction in the left side of the pons. Mild chronic small-vessel ischemic changes of the cerebral hemispheric white matter. Normal intracranial MR angiography of the large and medium size vessels with the exception of some atherosclerotic narrowing in the carotid siphon regions bilaterally. Electronically Signed   By: Nelson Chimes M.D.   On: 04/10/2016 11:22   US Carotid Bilateral (at [facility] And [facility] Only)  Result Date: 04/10/2016 CLINICAL DATA:  Cerebrovascular accident. EXAM: BILATERAL CAROTID DUPLEX ULTRASOUND TECHNIQUE: Pearline Cables  scale imaging, color Doppler and duplex ultrasound were performed of bilateral carotid and vertebral arteries in the neck. COMPARISON:  Ultrasound of Oct 30, 2005. FINDINGS: Criteria: Quantification of carotid stenosis is based on velocity parameters that correlate the residual internal carotid diameter with NASCET-based stenosis levels, using the diameter of the distal internal carotid lumen as the denominator for stenosis measurement. The following velocity measurements were obtained: RIGHT ICA:  83/18 cm/sec CCA:  [id number] cm/sec SYSTOLIC ICA/CCA RATIO:  0.7 DIASTOLIC ICA/CCA RATIO:  1.7 ECA:  67 cm/sec LEFT ICA:  77/29 cm/sec CCA:  0000000 cm/sec SYSTOLIC ICA/CCA RATIO:  0.7 DIASTOLIC ICA/CCA RATIO:  1.6 ECA:  74 cm/sec RIGHT CAROTID ARTERY: No significant plaque or stenosis is noted in the right cervical carotid arteries. RIGHT VERTEBRAL ARTERY:  Antegrade flow is noted. LEFT CAROTID ARTERY: Mild eccentric plaque formation is noted in the distal left common carotid artery and left carotid bulb. No significant plaque is noted in the left internal carotid artery. LEFT VERTEBRAL ARTERY:  Antegrade flow is noted. Incidental note is made of soft tissue mass or nodule measuring 2.7 x 1.3 x 1.1 cm in the left parathyroid region. Potentially this may represent parathyroid adenoma or nodule. IMPRESSION: No significant plaque or stenosis is noted in the right cervical carotid arteries. Mild eccentric plaque formation is noted in the distal left common carotid artery and left carotid bulb consistent with less than 50% diameter stenosis based on ultrasound and Doppler criteria. Incidental note is made of 2.7 x 1.3 x 1.1 cm soft tissue mass or nodule in left parathyroid region. This may represent parathyroid adenoma or nodule, and clinical correlation is recommended evaluate for possible hypercalcemia. Parathyroid nuclear medicine scan may be performed further evaluation. If there is no clinical indication for parathyroid  adenoma, then CT scan of the neck with contrast is recommended for further evaluation. These results will be called to the ordering clinician or representative by the Radiologist Assistant, and communication documented in the PACS or zVision Dashboard. Electronically Signed   By: Marijo Conception, M.D.   On: 04/10/2016 12:07   Mr Jodene Nam Head/brain [id number] Cm  Result Date: 04/10/2016 CLINICAL DATA:  [place] yesterday. Right-sided weakness. Negative head CT. EXAM: MRI HEAD WITHOUT CONTRAST MRA HEAD WITHOUT CONTRAST TECHNIQUE: Multiplanar, multiecho pulse sequences of the brain and surrounding structures were obtained without intravenous contrast. Angiographic images of the head were obtained using MRA technique without contrast.  COMPARISON:  Head CT 04/09/2016 FINDINGS: MRI HEAD FINDINGS Brain: There is a 1 cm acute/subacute infarction in the left side of the pons. No other acute infarction. No cerebellar insult. Cerebral hemispheres show mild chronic small-vessel ischemic changes of the white matter. There are prominent perivascular spaces diffusely. No cortical or large vessel territory infarction. No mass lesion, hemorrhage, hydrocephalus or extra-axial collection. Vascular: Major vessels at the base of the brain show flow. Skull and upper cervical spine: Negative Sinuses/Orbits: Clear/normal Other: None significant MRA HEAD FINDINGS Both internal carotid arteries are patent into the brain. There are atherosclerotic changes in the carotid siphon bilaterally. The anterior and middle cerebral vessels are patent without proximal stenosis, aneurysm or vascular malformation. Both vertebral arteries are widely patent to the basilar. No basilar stenosis. Posterior circulation branch vessels appear normal. IMPRESSION: 1 cm acute/ subacute infarction in the left side of the pons. Mild chronic small-vessel ischemic changes of the cerebral hemispheric white matter. Normal intracranial MR angiography of the large and medium size  vessels with the exception of some atherosclerotic narrowing in the carotid siphon regions bilaterally. Electronically Signed   By: Nelson Chimes M.D.   On: 04/10/2016 11:22   Dg Hip Unilat With Pelvis 2-3 Views Left  Result Date: 04/10/2016 CLINICAL DATA:  Left hip pain.  Recent fall.  Initial encounter. EXAM: DG HIP (WITH OR WITHOUT PELVIS) 2-3V LEFT COMPARISON:  None. FINDINGS: There is no evidence of acute fracture or dislocation. Hip joint space widths are preserved. No suspicious osseous lesion is seen. Atherosclerotic vascular calcification is noted. IMPRESSION: No evidence of acute osseous abnormality. Electronically Signed   By: Logan Bores M.D.   On: 04/10/2016 12:18      Assessment & Plan: Pt is a 65 y.o. African American male with long-standing diabetes, hypertension, coronary disease, CABG in 2006, advanced chronic kidney disease, hyperlipidemia was admitted on 04/09/2016 with symptoms of right leg weakness , difficulty walking.    1.  Chronic kidney disease stage V Patient appears to have mild uremic symptoms such as dysguesia and weight loss.  He is also intentionally trying to eat less and lose weight for better control of his diabetes.   Electrolytes and volume status are acceptable.  No acute indication for dialysis at present.  He is scheduled for AV access placement later this month.  He will continue to followup with [facility] nephrology Dr. Johnney Ou.  2. DM-2 with CKD - Patient is trying hard to control his diabetes.  He states his hemoglobin A1c has decreased down from 12 in the past to 6.8 this admission.  3. Hypertension Suboptimal control Blood pressure in the [id number] range systolic Goal blood pressure 0000000 to [id number] systolic Current home regimen includes furosemide, hydralazine, lisinopril, metoprolol Can consider adding calcium channel blocker to his regimen.  We'll defer that to his outpatient team.

## 2016-04-11 NOTE — Discharge Instructions (Signed)
Keep log of sugars and BP at home

## 2016-04-11 NOTE — Care Management (Signed)
Admitted to [facility] with the diagnosis of CVA. Lives with wife, Festus Holts [phone]). Last seen Dr. Delight Stare 3 months ago. Home health with [facility] in the past, doesn't remember name of company. No skilled facility. No home oxygen. Uses no aids for ambulation. Prescriptions are filled through Mail orders. Takes care of all basic and instrumental activities of daily living himself, drives. No falls. Good appetite. Wife will transport. Physical therapy evaluation completed. Recommending home with home health, physical therapy, and rolling walker. Discussed agencies. [facility], Will update Floydene Flock., [facility] representative. Discharge to home today per Dr. Reginia Forts RN MSN CCM Care Management [phone]

## 2016-04-11 NOTE — Progress Notes (Signed)
Patient d/ced home.  Will be getting home care PT.  NIHSS = 2 only because patient had some BLE drift.  Patient met with Dr. Candiss Norse, Nephrology about possible.Patient going home with wife.

## 2016-04-11 NOTE — Discharge Summary (Signed)
[place] [facility] [place] NAME: Collen Jenerette    MR#:  [id number]  DATE OF BIRTH:  [birth year]  DATE OF ADMISSION:  04/09/2016 ADMITTING PHYSICIAN: Harvie Bridge, DO  DATE OF DISCHARGE: 04/11/16  PRIMARY CARE PHYSICIAN: Marguerita Merles, MD    ADMISSION DIAGNOSIS:  Weakness [R53.1]  DISCHARGE DIAGNOSIS:  Acute/subacute Left pons CVA Left parathyroid nodule (work up as out pt) CKD V -schedule oto get HD access at [facility] in Nov HTN DM-2  SECONDARY DIAGNOSIS:   Past Medical History:  Diagnosis Date  . CKD (chronic kidney disease)   . Coronary atherosclerosis of unspecified type of vessel, native or graft    Myocardial infarction in 2006. Cardiac catheterization showed significant three-vessel coronary artery disease. He underwent CABG at [facility]. Most recent cardiac catheterization in 2010 showed normal LV systolic function, patent grafts to OM1, RCA and LAD. Occluded SVG to diagonal.  . 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 Hyperlipidemia   . Hypertension   . Hypertension, renal disease   . Impotence of organic origin   . Keloid scar   . Neoplasm of uncertain behavior, site unspecified   . Peripheral vascular disease, unspecified   . Proteinuria   . Unspecified vitamin D deficiency     HOSPITAL COURSE:   65 y.o.malewith a history of Coronary artery disease status post MI and CABG in 2006, chronic kidney disease-V, hypertension, hyperlipidemiacame in with right-sided weakness and found to have  1. Acute/subacute left pons CVA, persistent symptoms starting two days ago.  -On aspirin added Plavix by neurology -MRI brain positive for subacute infarct left pons -Echo results pending -Carotid Doppler shows mild atherosclerosis with no stenosis -PT pending  2. History of chronic kidney disease with creatinine at about baseline -patient's baseline creatinine is 8 -we'll consult nephrology for  comanagement during hospitalization. -He follows  [facility] neurology and is in the process of getting access for dialysis -Patient with a follow Dr. Candiss Norse  3. History of diabetes-continue Lantus, Accu-Cheks with insulin findings, coverage every 4 hours -Patient has been noncompliant with his insulin. We'll follow sliding scale insulin  4. History of peripheral neuropathy-continue Neurontin  5. History of secondary hyperparathyroidism-continue Calcitrol  6. History of hypertension-continue hydralazine, lisinopril and metoprolol with permissive hypertension  7. History of hyperlipidemia-continue Crestor  8. Incidental finding of left parathyroid nodule Calcium ok. Will have w/u has outpt thru PCP  D/c home with HHPT CONSULTS OBTAINED:  Treatment Team:  Alexis Goodell, MD Murlean Iba, MD  DRUG ALLERGIES:   Allergies  Allergen Reactions  . Lipitor [Atorvastatin] Other (See Comments)    Muscle cramping    DISCHARGE MEDICATIONS:   Current Discharge Medication List    START taking these medications   Details  clopidogrel (PLAVIX) 75 MG tablet Take 1 tablet (75 mg total) by mouth daily. Qty: 30 tablet, Refills: 1      CONTINUE these medications which have CHANGED   Details  insulin glargine (LANTUS) 100 UNIT/ML injection Inject 0.25 mLs (25 Units total) into the skin at bedtime. Takes 80 units at bedtimed;Titrate 4 units every 4 days unitl FBG are in the 130s. Qty: 10 mL, Refills: 11      CONTINUE these medications which have NOT CHANGED   Details  aspirin 81 MG tablet Take 81 mg by mouth daily.    calcitRIOL (ROCALTROL) 0.25 MCG capsule Take 0.25 mcg by mouth every other day.    Cholecalciferol (VITAMIN  D3) 50000 UNITS CAPS Take 1 capsule by mouth once a week.     furosemide (LASIX) 40 MG tablet Take 40 mg by mouth 2 (two) times daily.    gabapentin (NEURONTIN) 300 MG capsule Take 300 mg by mouth 3 (three) times daily.    hydrALAZINE (APRESOLINE) 50 MG tablet  Take 50 mg by mouth 2 (two) times daily.    insulin aspart (NOVOLOG) 100 UNIT/ML injection Inject into the skin 3 (three) times daily before meals. Per sliding scale    lisinopril (PRINIVIL,ZESTRIL) 40 MG tablet Take 40 mg by mouth daily.    metoprolol succinate (TOPROL-XL) 25 MG 24 hr tablet Take 25 mg by mouth daily.    rosuvastatin (CRESTOR) 20 MG tablet Take 20 mg by mouth at bedtime.     Insulin Pen Needle (PRODIGY MINI PEN NEEDLES) 31G X 5 MM MISC by Does not apply route as directed.    sildenafil (VIAGRA) 50 MG tablet Takes 1-2 tablets as needed.        If you experience worsening of your admission symptoms, develop shortness of breath, life threatening emergency, suicidal or homicidal thoughts you must seek medical attention immediately by calling 911 or calling your MD immediately  if symptoms less severe.  You Must read complete instructions/literature along with all the possible adverse reactions/side effects for all the Medicines you take and that have been prescribed to you. Take any new Medicines after you have completely understood and accept all the possible adverse reactions/side effects.   Please note  You were cared for by a hospitalist during your hospital stay. If you have any questions about your discharge medications or the care you received while you were in the hospital after you are discharged, you can call the unit and asked to speak with the hospitalist on call if the hospitalist that took care of you is not available. Once you are discharged, your primary care physician will handle any further medical issues. Please note that NO REFILLS for any discharge medications will be authorized once you are discharged, as it is imperative that you return to your primary care physician (or establish a relationship with a primary care physician if you do not have one) for your aftercare needs so that they can reassess your need for medications and monitor your lab  values. Today   SUBJECTIVE   Doing well VITAL SIGNS:  Blood pressure (!) 148/63, pulse (!) 124, temperature 98.4 F (36.9 C), temperature source Oral, resp. rate 17, height 6' (1.829 m), weight 111 kg (244 lb 11.2 oz), SpO2 98 %.  I/O:   Intake/Output Summary (Last 24 hours) at 04/11/16 0736 Last data filed at 04/10/16 1300  Gross per 24 hour  Intake              480 ml  Output                1 ml  Net              479 ml    PHYSICAL EXAMINATION:  GENERAL:  65 y.o.-year-old patient lying in the bed with no acute distress.  EYES: Pupils equal, round, reactive to light and accommodation. No scleral icterus. Extraocular muscles intact.  HEENT: Head atraumatic, normocephalic. Oropharynx and nasopharynx clear.  NECK:  Supple, no jugular venous distention. No thyroid enlargement, no tenderness.  LUNGS: Normal breath sounds bilaterally, no wheezing, rales,rhonchi or crepitation. No use of accessory muscles of respiration.  CARDIOVASCULAR: S1, S2 normal. No murmurs,  rubs, or gallops.  ABDOMEN: Soft, non-tender, non-distended. Bowel sounds present. No organomegaly or mass.  EXTREMITIES: No pedal edema, cyanosis, or clubbing.  NEUROLOGIC: Cranial nerves II through XII are intact. Muscle strength 5/5 in all extremities. Sensation intact. Gait not checked.  PSYCHIATRIC: The patient is alert and oriented x 3.  SKIN: No obvious rash, lesion, or ulcer.   DATA REVIEW:   CBC   Recent Labs Lab 04/10/16 0721  WBC 7.5  HGB 9.4*  HCT 28.9*  PLT 179    Chemistries   Recent Labs Lab 04/10/16 0153 04/10/16 0721  NA  --  140  K  --  3.6  CL  --  113*  CO2  --  21*  GLUCOSE  --  63*  BUN  --  55*  CREATININE  --  7.65*  CALCIUM  --  9.8  MG 1.9  --     Microbiology Results   No results found for this or any previous visit (from the past 240 hour(s)).  RADIOLOGY:  Dg Chest 2 View  Result Date: 04/09/2016 CLINICAL DATA:  Right-sided weakness. EXAM: CHEST  2 VIEW COMPARISON:   Chest radiograph 12/26/2004 FINDINGS: Median sternotomy wires, mediastinal surgical clips clips and CABG markers are noted. The cardiomediastinal silhouette is markedly enlarged. There is no pneumothorax or sizable pleural effusion. No pulmonary edema or focal airspace consolidation. There is bibasilar atelectasis. IMPRESSION: 1. Markedly enlarged cardiomediastinal silhouette, possibly pericardial effusion versus cardiomegaly alone. 2. No focal airspace disease. Electronically Signed   By: Ulyses Jarred M.D.   On: 04/09/2016 18:56   Ct Head Wo Contrast  Result Date: 04/09/2016 CLINICAL DATA:  Status post fall. No reported loss of consciousness. EXAM: CT HEAD WITHOUT CONTRAST TECHNIQUE: Contiguous axial images were obtained from the base of the skull through the vertex without intravenous contrast. COMPARISON:  None. FINDINGS: Brain: No mass lesion, intraparenchymal hemorrhage or extra-axial collection. No evidence of acute cortical infarct. Brain parenchyma and CSF-containing spaces are normal for age. Vascular: No hyperdense vessel or unexpected calcification. Skull: Normal visualized skull base, calvarium and extracranial soft tissues. Sinuses/Orbits: No sinus fluid levels or advanced mucosal thickening. No mastoid effusion. Normal orbits. IMPRESSION: Normal head CT for age. Electronically Signed   By: Ulyses Jarred M.D.   On: 04/09/2016 18:58   Mr Brain Wo Contrast  Result Date: 04/10/2016 CLINICAL DATA:  [place] yesterday. Right-sided weakness. Negative head CT. EXAM: MRI HEAD WITHOUT CONTRAST MRA HEAD WITHOUT CONTRAST TECHNIQUE: Multiplanar, multiecho pulse sequences of the brain and surrounding structures were obtained without intravenous contrast. Angiographic images of the head were obtained using MRA technique without contrast. COMPARISON:  Head CT 04/09/2016 FINDINGS: MRI HEAD FINDINGS Brain: There is a 1 cm acute/subacute infarction in the left side of the pons. No other acute infarction. No  cerebellar insult. Cerebral hemispheres show mild chronic small-vessel ischemic changes of the white matter. There are prominent perivascular spaces diffusely. No cortical or large vessel territory infarction. No mass lesion, hemorrhage, hydrocephalus or extra-axial collection. Vascular: Major vessels at the base of the brain show flow. Skull and upper cervical spine: Negative Sinuses/Orbits: Clear/normal Other: None significant MRA HEAD FINDINGS Both internal carotid arteries are patent into the brain. There are atherosclerotic changes in the carotid siphon bilaterally. The anterior and middle cerebral vessels are patent without proximal stenosis, aneurysm or vascular malformation. Both vertebral arteries are widely patent to the basilar. No basilar stenosis. Posterior circulation branch vessels appear normal. IMPRESSION: 1 cm acute/ subacute infarction in the  left side of the pons. Mild chronic small-vessel ischemic changes of the cerebral hemispheric white matter. Normal intracranial MR angiography of the large and medium size vessels with the exception of some atherosclerotic narrowing in the carotid siphon regions bilaterally. Electronically Signed   By: Nelson Chimes M.D.   On: 04/10/2016 11:22   US Carotid Bilateral (at [facility] Only)  Result Date: 04/10/2016 CLINICAL DATA:  Cerebrovascular accident. EXAM: BILATERAL CAROTID DUPLEX ULTRASOUND TECHNIQUE: Pearline Cables scale imaging, color Doppler and duplex ultrasound were performed of bilateral carotid and vertebral arteries in the neck. COMPARISON:  Ultrasound of Oct 30, 2005. FINDINGS: Criteria: Quantification of carotid stenosis is based on velocity parameters that correlate the residual internal carotid diameter with NASCET-based stenosis levels, using the diameter of the distal internal carotid lumen as the denominator for stenosis measurement. The following velocity measurements were obtained: RIGHT ICA:  83/18 cm/sec CCA:  [id number] cm/sec SYSTOLIC ICA/CCA  RATIO:  0.7 DIASTOLIC ICA/CCA RATIO:  1.7 ECA:  67 cm/sec LEFT ICA:  77/29 cm/sec CCA:  0000000 cm/sec SYSTOLIC ICA/CCA RATIO:  0.7 DIASTOLIC ICA/CCA RATIO:  1.6 ECA:  74 cm/sec RIGHT CAROTID ARTERY: No significant plaque or stenosis is noted in the right cervical carotid arteries. RIGHT VERTEBRAL ARTERY:  Antegrade flow is noted. LEFT CAROTID ARTERY: Mild eccentric plaque formation is noted in the distal left common carotid artery and left carotid bulb. No significant plaque is noted in the left internal carotid artery. LEFT VERTEBRAL ARTERY:  Antegrade flow is noted. Incidental note is made of soft tissue mass or nodule measuring 2.7 x 1.3 x 1.1 cm in the left parathyroid region. Potentially this may represent parathyroid adenoma or nodule. IMPRESSION: No significant plaque or stenosis is noted in the right cervical carotid arteries. Mild eccentric plaque formation is noted in the distal left common carotid artery and left carotid bulb consistent with less than 50% diameter stenosis based on ultrasound and Doppler criteria. Incidental note is made of 2.7 x 1.3 x 1.1 cm soft tissue mass or nodule in left parathyroid region. This may represent parathyroid adenoma or nodule, and clinical correlation is recommended evaluate for possible hypercalcemia. Parathyroid nuclear medicine scan may be performed further evaluation. If there is no clinical indication for parathyroid adenoma, then CT scan of the neck with contrast is recommended for further evaluation. These results will be called to the ordering clinician or representative by the Radiologist Assistant, and communication documented in the PACS or zVision Dashboard. Electronically Signed   By: Marijo Conception, M.D.   On: 04/10/2016 12:07   Mr Jodene Nam Head/brain [id number] Cm  Result Date: 04/10/2016 CLINICAL DATA:  [place] yesterday. Right-sided weakness. Negative head CT. EXAM: MRI HEAD WITHOUT CONTRAST MRA HEAD WITHOUT CONTRAST TECHNIQUE: Multiplanar, multiecho pulse  sequences of the brain and surrounding structures were obtained without intravenous contrast. Angiographic images of the head were obtained using MRA technique without contrast. COMPARISON:  Head CT 04/09/2016 FINDINGS: MRI HEAD FINDINGS Brain: There is a 1 cm acute/subacute infarction in the left side of the pons. No other acute infarction. No cerebellar insult. Cerebral hemispheres show mild chronic small-vessel ischemic changes of the white matter. There are prominent perivascular spaces diffusely. No cortical or large vessel territory infarction. No mass lesion, hemorrhage, hydrocephalus or extra-axial collection. Vascular: Major vessels at the base of the brain show flow. Skull and upper cervical spine: Negative Sinuses/Orbits: Clear/normal Other: None significant MRA HEAD FINDINGS Both internal carotid arteries are patent into the brain. There are atherosclerotic changes in  the carotid siphon bilaterally. The anterior and middle cerebral vessels are patent without proximal stenosis, aneurysm or vascular malformation. Both vertebral arteries are widely patent to the basilar. No basilar stenosis. Posterior circulation branch vessels appear normal. IMPRESSION: 1 cm acute/ subacute infarction in the left side of the pons. Mild chronic small-vessel ischemic changes of the cerebral hemispheric white matter. Normal intracranial MR angiography of the large and medium size vessels with the exception of some atherosclerotic narrowing in the carotid siphon regions bilaterally. Electronically Signed   By: Nelson Chimes M.D.   On: 04/10/2016 11:22   Dg Hip Unilat With Pelvis 2-3 Views Left  Result Date: 04/10/2016 CLINICAL DATA:  Left hip pain.  Recent fall.  Initial encounter. EXAM: DG HIP (WITH OR WITHOUT PELVIS) 2-3V LEFT COMPARISON:  None. FINDINGS: There is no evidence of acute fracture or dislocation. Hip joint space widths are preserved. No suspicious osseous lesion is seen. Atherosclerotic vascular  calcification is noted. IMPRESSION: No evidence of acute osseous abnormality. Electronically Signed   By: Logan Bores M.D.   On: 04/10/2016 12:18     Management plans discussed with the patient, family and they are in agreement.  CODE STATUS:     Code Status Orders        Start     Ordered   04/10/16 0036  Full code  Continuous     04/10/16 0035    Code Status History    Date Active Date Inactive Code Status Order ID Comments User Context   04/10/2016 12:35 AM  Full Code [id number]  Harvie Bridge, DO Inpatient      TOTAL TIME TAKING CARE OF THIS PATIENT: 40 minutes.    Sera Hitsman M.D on 04/11/2016 at 7:36 AM  Between 7am to 6pm - Pager - [phone] After 6pm go to [email or web address] - password [email or web address] [facility]  Office  [phone]  CC: Primary care physician; Marguerita Merles, MD

## 2016-04-11 NOTE — Progress Notes (Signed)
OT Cancellation Note  Patient Details Name: Terry Tyler MRN: [id number] DOB: 04/14/1951   Cancelled Treatment:    Reason Eval/Treat Not Completed: OT screened, no needs identified, will sign off. Order received and chart reviewed.  Met with patient who indicated he was at baseline for OT and declined full evaluation or any further OT. He will be receiving PT at home and rec he notify his doctor if his needs change and could benefit from OT at home. Hassan Rowan from SW updated and confirmed with PT that a rolling walker needs to be ordered for use at home.  Chrys Racer, OTR/L ascom [phone] 04/11/16, 9:03 AM

## 2016-08-14 ENCOUNTER — Encounter (INDEPENDENT_AMBULATORY_CARE_PROVIDER_SITE_OTHER): Payer: Self-pay | Admitting: Vascular Surgery

## 2016-08-14 ENCOUNTER — Encounter (INDEPENDENT_AMBULATORY_CARE_PROVIDER_SITE_OTHER): Payer: Self-pay

## 2016-09-08 ENCOUNTER — Emergency Department: Payer: Medicare HMO

## 2016-09-08 ENCOUNTER — Observation Stay
Admission: EM | Admit: 2016-09-08 | Discharge: 2016-09-11 | Disposition: A | Discharge status: Home / Self Care | Payer: Medicare HMO | Attending: Internal Medicine | Admitting: Internal Medicine

## 2016-09-08 DIAGNOSIS — N521 Erectile dysfunction due to diseases classified elsewhere: Secondary | ICD-10-CM | POA: Diagnosis not present

## 2016-09-08 DIAGNOSIS — Z794 Long term (current) use of insulin: Secondary | ICD-10-CM | POA: Diagnosis not present

## 2016-09-08 DIAGNOSIS — I132 Hypertensive heart and chronic kidney disease with heart failure and with stage 5 chronic kidney disease, or end stage renal disease: Secondary | ICD-10-CM | POA: Diagnosis not present

## 2016-09-08 DIAGNOSIS — E1122 Type 2 diabetes mellitus with diabetic chronic kidney disease: Secondary | ICD-10-CM | POA: Diagnosis not present

## 2016-09-08 DIAGNOSIS — N186 End stage renal disease: Secondary | ICD-10-CM | POA: Insufficient documentation

## 2016-09-08 DIAGNOSIS — I25810 Atherosclerosis of coronary artery bypass graft(s) without angina pectoris: Secondary | ICD-10-CM | POA: Insufficient documentation

## 2016-09-08 DIAGNOSIS — R197 Diarrhea, unspecified: Secondary | ICD-10-CM | POA: Diagnosis present

## 2016-09-08 DIAGNOSIS — A084 Viral intestinal infection, unspecified: Secondary | ICD-10-CM | POA: Insufficient documentation

## 2016-09-08 DIAGNOSIS — I214 Non-ST elevation (NSTEMI) myocardial infarction: Secondary | ICD-10-CM | POA: Diagnosis not present

## 2016-09-08 DIAGNOSIS — Z992 Dependence on renal dialysis: Secondary | ICD-10-CM | POA: Diagnosis not present

## 2016-09-08 DIAGNOSIS — I420 Dilated cardiomyopathy: Secondary | ICD-10-CM | POA: Insufficient documentation

## 2016-09-08 DIAGNOSIS — Z8249 Family history of ischemic heart disease and other diseases of the circulatory system: Secondary | ICD-10-CM | POA: Diagnosis not present

## 2016-09-08 DIAGNOSIS — Z87891 Personal history of nicotine dependence: Secondary | ICD-10-CM | POA: Insufficient documentation

## 2016-09-08 DIAGNOSIS — Z8673 Personal history of transient ischemic attack (TIA), and cerebral infarction without residual deficits: Secondary | ICD-10-CM | POA: Insufficient documentation

## 2016-09-08 DIAGNOSIS — E559 Vitamin D deficiency, unspecified: Secondary | ICD-10-CM | POA: Insufficient documentation

## 2016-09-08 DIAGNOSIS — E785 Hyperlipidemia, unspecified: Secondary | ICD-10-CM | POA: Insufficient documentation

## 2016-09-08 DIAGNOSIS — I255 Ischemic cardiomyopathy: Secondary | ICD-10-CM | POA: Insufficient documentation

## 2016-09-08 DIAGNOSIS — R778 Other specified abnormalities of plasma proteins: Secondary | ICD-10-CM

## 2016-09-08 DIAGNOSIS — Z7982 Long term (current) use of aspirin: Secondary | ICD-10-CM | POA: Diagnosis not present

## 2016-09-08 DIAGNOSIS — E876 Hypokalemia: Secondary | ICD-10-CM | POA: Insufficient documentation

## 2016-09-08 DIAGNOSIS — R1110 Vomiting, unspecified: Secondary | ICD-10-CM

## 2016-09-08 DIAGNOSIS — I5022 Chronic systolic (congestive) heart failure: Secondary | ICD-10-CM | POA: Diagnosis not present

## 2016-09-08 DIAGNOSIS — R7989 Other specified abnormal findings of blood chemistry: Secondary | ICD-10-CM

## 2016-09-08 DIAGNOSIS — R531 Weakness: Secondary | ICD-10-CM

## 2016-09-08 DIAGNOSIS — E1151 Type 2 diabetes mellitus with diabetic peripheral angiopathy without gangrene: Secondary | ICD-10-CM | POA: Insufficient documentation

## 2016-09-08 DIAGNOSIS — Z7902 Long term (current) use of antithrombotics/antiplatelets: Secondary | ICD-10-CM | POA: Diagnosis not present

## 2016-09-08 LAB — TROPONIN I
TROPONIN I: 1.41 ng/mL — AB (ref <0.03)
TROPONIN I: 1.22 ng/mL — AB (ref <0.03)
Troponin I: 1.51 ng/mL (ref <0.03)

## 2016-09-08 LAB — HEPATIC FUNCTION PANEL
ALBUMIN: 3.6 g/dL (ref 3.5–5.0)
ALK PHOS: 94 U/L (ref 38–126)
ALT: 12 U/L — AB (ref 17–63)
AST: 21 U/L (ref 15–41)
BILIRUBIN DIRECT: 0.2 mg/dL (ref 0.1–0.5)
BILIRUBIN TOTAL: 0.9 mg/dL (ref 0.3–1.2)
Indirect Bilirubin: 0.7 mg/dL (ref 0.3–0.9)
Total Protein: 8.6 g/dL — ABNORMAL HIGH (ref 6.5–8.1)

## 2016-09-08 LAB — MAGNESIUM: Magnesium: 1.7 mg/dL (ref 1.7–2.4)

## 2016-09-08 LAB — CBC
HEMATOCRIT: 32.6 % — AB (ref 40.0–52.0)
HEMOGLOBIN: 11.1 g/dL — AB (ref 13.0–18.0)
MCH: 28.3 pg (ref 26.0–34.0)
MCHC: 33.9 g/dL (ref 32.0–36.0)
MCV: 83.5 fL (ref 80.0–100.0)
Platelets: 189 10*3/uL (ref 150–440)
RBC: 3.9 MIL/uL — AB (ref 4.40–5.90)
RDW: 15.7 % — ABNORMAL HIGH (ref 11.5–14.5)
WBC: 11.3 10*3/uL — AB (ref 3.8–10.6)

## 2016-09-08 LAB — BASIC METABOLIC PANEL
Anion gap: 14 (ref 5–15)
BUN: 29 mg/dL — ABNORMAL HIGH (ref 6–20)
CO2: 27 mmol/L (ref 22–32)
CREATININE: 6.17 mg/dL — AB (ref 0.61–1.24)
Calcium: 9.2 mg/dL (ref 8.9–10.3)
Chloride: 93 mmol/L — ABNORMAL LOW (ref 101–111)
GFR calc non Af Amer: 9 mL/min — ABNORMAL LOW (ref >60)
GFR, EST AFRICAN AMERICAN: 10 mL/min — AB (ref >60)
Glucose, Bld: 199 mg/dL — ABNORMAL HIGH (ref 65–99)
Potassium: 3.4 mmol/L — ABNORMAL LOW (ref 3.5–5.1)
Sodium: 134 mmol/L — ABNORMAL LOW (ref 135–145)

## 2016-09-08 LAB — GLUCOSE, CAPILLARY
GLUCOSE-CAPILLARY: 212 mg/dL — AB (ref 65–99)
Glucose-Capillary: 208 mg/dL — ABNORMAL HIGH (ref 65–99)

## 2016-09-08 LAB — LIPID PANEL
Cholesterol: 202 mg/dL — ABNORMAL HIGH (ref 0–200)
HDL: 29 mg/dL — ABNORMAL LOW (ref >40)
LDL CALC: 127 mg/dL — AB (ref 0–99)
Total CHOL/HDL Ratio: 7 RATIO
Triglycerides: 228 mg/dL — ABNORMAL HIGH (ref <150)
VLDL: 46 mg/dL — AB (ref 0–40)

## 2016-09-08 LAB — INFLUENZA PANEL BY PCR (TYPE A & B)
INFLBPCR: NEGATIVE
Influenza A By PCR: NEGATIVE

## 2016-09-08 MED ORDER — CLOPIDOGREL BISULFATE 75 MG PO TABS
75.0000 mg | ORAL_TABLET | Freq: Every evening | ORAL | Status: DC
  Administered 2016-09-09: 75 mg via ORAL
  Filled 2016-09-08: qty 1

## 2016-09-08 MED ORDER — CALCITRIOL 0.25 MCG PO CAPS
0.2500 ug | ORAL_CAPSULE | ORAL | Status: DC
  Administered 2016-09-09 – 2016-09-11 (×2): 0.25 ug via ORAL
  Filled 2016-09-08 (×2): qty 1

## 2016-09-08 MED ORDER — HYDRALAZINE HCL 50 MG PO TABS
50.0000 mg | ORAL_TABLET | Freq: Two times a day (BID) | ORAL | Status: DC
  Administered 2016-09-08 – 2016-09-11 (×5): 50 mg via ORAL
  Filled 2016-09-08 (×5): qty 1

## 2016-09-08 MED ORDER — METOPROLOL SUCCINATE ER 25 MG PO TB24
25.0000 mg | ORAL_TABLET | Freq: Every evening | ORAL | Status: DC
  Administered 2016-09-10: 25 mg via ORAL
  Filled 2016-09-08 (×2): qty 1

## 2016-09-08 MED ORDER — VITAMIN D3 1.25 MG (50000 UT) PO CAPS: 1.0000 | ORAL_CAPSULE | ORAL | Status: DC

## 2016-09-08 MED ORDER — GABAPENTIN 300 MG PO CAPS
300.0000 mg | ORAL_CAPSULE | Freq: Three times a day (TID) | ORAL | Status: DC
  Administered 2016-09-08 – 2016-09-11 (×8): 300 mg via ORAL
  Filled 2016-09-08 (×8): qty 1

## 2016-09-08 MED ORDER — INSULIN GLARGINE 100 UNIT/ML ~~LOC~~ SOLN
20.0000 [IU] | Freq: Every day | SUBCUTANEOUS | Status: DC
  Administered 2016-09-08 – 2016-09-09 (×2): 20 [IU] via SUBCUTANEOUS
  Filled 2016-09-08 (×4): qty 0.2

## 2016-09-08 MED ORDER — SODIUM CHLORIDE 0.9 % IV BOLUS (SEPSIS)
500.0000 mL | Freq: Once | INTRAVENOUS | Status: AC
  Administered 2016-09-08: 500 mL via INTRAVENOUS

## 2016-09-08 MED ORDER — ASPIRIN 81 MG PO CHEW
324.0000 mg | CHEWABLE_TABLET | Freq: Once | ORAL | Status: AC
  Administered 2016-09-08: 324 mg via ORAL
  Filled 2016-09-08: qty 4

## 2016-09-08 MED ORDER — HEPARIN SODIUM (PORCINE) 5000 UNIT/ML IJ SOLN
5000.0000 [IU] | Freq: Three times a day (TID) | INTRAMUSCULAR | Status: DC
  Administered 2016-09-08 – 2016-09-11 (×5): 5000 [IU] via SUBCUTANEOUS
  Filled 2016-09-08 (×7): qty 1

## 2016-09-08 MED ORDER — ONDANSETRON HCL 4 MG/2ML IJ SOLN: 4.0000 mg | Freq: Four times a day (QID) | INTRAMUSCULAR | Status: DC | PRN

## 2016-09-08 MED ORDER — ROSUVASTATIN CALCIUM 20 MG PO TABS
20.0000 mg | ORAL_TABLET | Freq: Every day | ORAL | Status: DC
  Administered 2016-09-08 – 2016-09-10 (×3): 20 mg via ORAL
  Filled 2016-09-08 (×3): qty 1

## 2016-09-08 MED ORDER — INSULIN ASPART 100 UNIT/ML ~~LOC~~ SOLN
0.0000 [IU] | Freq: Three times a day (TID) | SUBCUTANEOUS | Status: DC
  Administered 2016-09-08: 3 [IU] via SUBCUTANEOUS
  Administered 2016-09-09: 2 [IU] via SUBCUTANEOUS
  Administered 2016-09-09 (×2): 3 [IU] via SUBCUTANEOUS
  Administered 2016-09-10: 1 [IU] via SUBCUTANEOUS
  Administered 2016-09-10 – 2016-09-11 (×2): 2 [IU] via SUBCUTANEOUS
  Filled 2016-09-08: qty 2
  Filled 2016-09-08 (×3): qty 3
  Filled 2016-09-08: qty 1
  Filled 2016-09-08: qty 2

## 2016-09-08 MED ORDER — INSULIN ASPART 100 UNIT/ML ~~LOC~~ SOLN
10.0000 [IU] | Freq: Three times a day (TID) | SUBCUTANEOUS | Status: DC
  Administered 2016-09-09 – 2016-09-11 (×5): 10 [IU] via SUBCUTANEOUS
  Filled 2016-09-08 (×4): qty 10

## 2016-09-08 MED ORDER — ONDANSETRON HCL 4 MG/2ML IJ SOLN
4.0000 mg | Freq: Once | INTRAMUSCULAR | Status: AC
  Administered 2016-09-08: 4 mg via INTRAVENOUS
  Filled 2016-09-08: qty 2

## 2016-09-08 MED ORDER — ASPIRIN EC 81 MG PO TBEC: 81.0000 mg | DELAYED_RELEASE_TABLET | Freq: Every evening | ORAL | Status: DC

## 2016-09-08 MED ORDER — LISINOPRIL 20 MG PO TABS
40.0000 mg | ORAL_TABLET | Freq: Every day | ORAL | Status: DC
  Administered 2016-09-09 – 2016-09-11 (×2): 40 mg via ORAL
  Filled 2016-09-08 (×2): qty 2

## 2016-09-08 MED ORDER — SODIUM CHLORIDE 0.9% FLUSH
3.0000 mL | Freq: Two times a day (BID) | INTRAVENOUS | Status: DC
  Administered 2016-09-08 – 2016-09-10 (×4): 3 mL via INTRAVENOUS

## 2016-09-08 MED ORDER — DOCUSATE SODIUM 100 MG PO CAPS: 100.0000 mg | ORAL_CAPSULE | Freq: Two times a day (BID) | ORAL | Status: DC | PRN

## 2016-09-08 NOTE — ED Notes (Signed)
Pt given ginger ale.

## 2016-09-08 NOTE — ED Triage Notes (Signed)
Pt came to Ed via EMS from home c/o weakness, lack of energy, and loss of appetite since Monday. Pt is a type 2 diabetic, history of stroke last year and cabg in 06. Denies pain. VS stable.

## 2016-09-08 NOTE — H&P (Signed)
[facility] [place] NAME: Terry Tyler    MR#:  [id number]  DATE OF BIRTH:  1950/07/26  DATE OF ADMISSION:  09/08/2016  PRIMARY CARE PHYSICIAN: Marguerita Merles, MD   REQUESTING/REFERRING PHYSICIAN: Alfred Levins  CHIEF COMPLAINT:   Chief Complaint  Patient presents with  . Weakness    HISTORY OF PRESENT ILLNESS: Terry Tyler  is a 66 y.o. 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 For last 10 days he has on and off episodes of diarrhea and watery stool, also had few episodes of vomiting but he denies any associated fever, chills, abdominal pain. He denies any loss of blood during these episodes. He continued having his regular hemodialysis during this and last dialysis was as per schedule yesterday. For last 1 day he did not had any bowel movement but for almost a week since this thing started he is feeling overall generalized weakness and having some shortness of breath while moving around so he decided to come to emergency room today. He denies any chest pain. In ER his troponin was found 1.51 but after speaking cardiologist he thought mostly days demand ischemia and suggested to just monitor him so ER physician gave [place] for admitting this patient.  PAST MEDICAL HISTORY:   Past Medical History:  Diagnosis Date  . CKD (chronic kidney disease)   . Coronary atherosclerosis of unspecified type of vessel, native or graft    Myocardial infarction in 2006. Cardiac catheterization showed significant three-vessel coronary artery disease. He underwent CABG at [facility]. Most recent cardiac catheterization in 2010 showed normal LV systolic function, patent grafts to OM1, RCA and LAD. Occluded SVG to diagonal.  . Diabetes mellitus  without complication (HCC)    type I  . Hyperlipidemia   . Hypertension   . Hypertension, renal disease   . Impotence of organic origin   . Keloid scar   . Neoplasm of uncertain behavior, site unspecified   . Peripheral vascular disease, unspecified   . Proteinuria   . Unspecified vitamin D deficiency     PAST SURGICAL HISTORY: Past Surgical History:  Procedure Laterality Date  . CARDIAC CATHETERIZATION  12/2004   [facility]  . CARDIAC CATHETERIZATION  09/2008   [facility]  . CORONARY ARTERY BYPASS GRAFT  2006   [facility]  . keloid removal     right neck    SOCIAL HISTORY:  Social History  Substance Use Topics  . Smoking status: Former Smoker    Packs/day: 0.50    Years: 0.00    Types: Cigarettes  . Smokeless tobacco: Never Used  . Alcohol use No     Comment: socially    FAMILY HISTORY:  Family History  Problem Relation Age of Onset  . Hypertension Mother   . Hyperlipidemia Mother   . Heart disease Mother   . Heart disease Father   . Hyperlipidemia Father   . Hypertension Father     DRUG ALLERGIES:  Allergies  Allergen Reactions  . Lipitor [Atorvastatin] Other (See Comments)    Muscle cramping    REVIEW OF SYSTEMS:   CONSTITUTIONAL: No fever,Positive for fatigue or weakness.  EYES: No blurred or double vision.  EARS, NOSE, AND THROAT: No tinnitus or ear pain.  RESPIRATORY: No cough, shortness of breath, wheezing or hemoptysis.  CARDIOVASCULAR: No chest  pain, orthopnea, edema.  GASTROINTESTINAL: Positive for nausea, vomiting, diarrhea , no abdominal pain.  GENITOURINARY: No dysuria, hematuria.  ENDOCRINE: No polyuria, nocturia,  HEMATOLOGY: No anemia, easy bruising or bleeding SKIN: No rash or lesion. MUSCULOSKELETAL: No joint pain or arthritis.   NEUROLOGIC: No tingling, numbness, weakness.  PSYCHIATRY: No anxiety or depression.   MEDICATIONS AT HOME:  Prior to Admission medications   Medication Sig Start Date End Date Taking? Authorizing  Provider  aspirin 81 MG tablet Take 81 mg by mouth daily.   Yes Historical Provider, MD  clopidogrel (PLAVIX) 75 MG tablet Take 1 tablet (75 mg total) by mouth daily. 04/11/16  Yes Fritzi Mandes, MD  gabapentin (NEURONTIN) 300 MG capsule Take 300 mg by mouth 3 (three) times daily.   Yes Historical Provider, MD  hydrALAZINE (APRESOLINE) 50 MG tablet Take 50 mg by mouth 2 (two) times daily.   Yes Historical Provider, MD  insulin aspart (NOVOLOG) 100 UNIT/ML injection Inject 10-15 Units into the skin 3 (three) times daily before meals. Per sliding scale    Yes Historical Provider, MD  insulin glargine (LANTUS) 100 UNIT/ML injection Inject 0.25 mLs (25 Units total) into the skin at bedtime. Takes 80 units at bedtimed;Titrate 4 units every 4 days unitl FBG are in the 130s. Patient taking differently: Inject 25 Units into the skin at bedtime.  04/11/16  Yes Fritzi Mandes, MD  metoprolol succinate (TOPROL-XL) 25 MG 24 hr tablet Take 25 mg by mouth daily.   Yes Historical Provider, MD  rosuvastatin (CRESTOR) 20 MG tablet Take 20 mg by mouth at bedtime.    Yes Historical Provider, MD  sildenafil (VIAGRA) 50 MG tablet Takes 1-2 tablets as needed.   Yes Historical Provider, MD  calcitRIOL (ROCALTROL) 0.25 MCG capsule Take 0.25 mcg by mouth every other day.    Historical Provider, MD  Cholecalciferol (VITAMIN D3) 50000 UNITS CAPS Take 1 capsule by mouth once a week.     Historical Provider, MD  furosemide (LASIX) 40 MG tablet Take 40 mg by mouth 2 (two) times daily.    Historical Provider, MD  Insulin Pen Needle (PRODIGY MINI PEN NEEDLES) 31G X 5 MM MISC by Does not apply route as directed.    Historical Provider, MD  lisinopril (PRINIVIL,ZESTRIL) 40 MG tablet Take 40 mg by mouth daily.    Historical Provider, MD      PHYSICAL EXAMINATION:   VITAL SIGNS: Blood pressure (!) 145/76, pulse 72, temperature 99.1 F (37.3 C), temperature source Oral, resp. rate 14, height 6' (1.829 m), weight 110.2 kg (243 lb), SpO2 95  %.  GENERAL:  66 y.o.-year-old patient lying in the bed with no acute distress.  EYES: Pupils equal, round, reactive to light and accommodation. No scleral icterus. Extraocular muscles intact.  HEENT: Head atraumatic, normocephalic. Oropharynx and nasopharynx clear.  NECK:  Supple, no jugular venous distention. No thyroid enlargement, no tenderness.  LUNGS: Normal breath sounds bilaterally, no wheezing, rales,rhonchi or crepitation. No use of accessory muscles of respiration. Right upper chest dialysis catheter present. CARDIOVASCULAR: S1, S2 normal. No murmurs, rubs, or gallops.  ABDOMEN: Soft, nontender, nondistended. Bowel sounds present. No organomegaly or mass.  EXTREMITIES: No pedal edema, cyanosis, or clubbing.  NEUROLOGIC: Cranial nerves II through XII are intact. Muscle strength 5/5 in all extremities. Sensation intact. Gait not checked.  PSYCHIATRIC: The patient is alert and oriented x 3.  SKIN: No obvious rash, lesion, or ulcer.   LABORATORY PANEL:   CBC  Recent Labs Lab 09/08/16  1301  WBC 11.3*  HGB 11.1*  HCT 32.6*  PLT 189  MCV 83.5  MCH 28.3  MCHC 33.9  RDW 15.7*   ------------------------------------------------------------------------------------------------------------------  Chemistries   Recent Labs Lab 09/08/16 1301  NA 134*  K 3.4*  CL 93*  CO2 27  GLUCOSE 199*  BUN 29*  CREATININE 6.17*  CALCIUM 9.2  MG 1.7  AST 21  ALT 12*  ALKPHOS 94  BILITOT 0.9   ------------------------------------------------------------------------------------------------------------------ estimated creatinine clearance is 15.7 mL/min (A) (by C-G formula based on SCr of 6.17 mg/dL (H)). ------------------------------------------------------------------------------------------------------------------ No results for input(s): TSH, T4TOTAL, T3FREE, THYROIDAB in the last 72 hours.  Invalid input(s): FREET3   Coagulation profile No results for input(s): INR, PROTIME  in the last 168 hours. ------------------------------------------------------------------------------------------------------------------- No results for input(s): DDIMER in the last 72 hours. -------------------------------------------------------------------------------------------------------------------  Cardiac Enzymes  Recent Labs Lab 09/08/16 1301  TROPONINI 1.51*   ------------------------------------------------------------------------------------------------------------------ Invalid input(s): POCBNP  ---------------------------------------------------------------------------------------------------------------  Urinalysis    Component Value Date/Time   COLORURINE STRAW (A) 04/09/2016 2122   APPEARANCEUR CLEAR (A) 04/09/2016 2122   LABSPEC 1.007 04/09/2016 2122   PHURINE 6.0 04/09/2016 2122   GLUCOSEU >500 (A) 04/09/2016 2122   HGBUR 1+ (A) 04/09/2016 2122   BILIRUBINUR NEGATIVE 04/09/2016 2122   KETONESUR NEGATIVE 04/09/2016 2122   PROTEINUR >500 (A) 04/09/2016 2122   NITRITE NEGATIVE 04/09/2016 2122   LEUKOCYTESUR NEGATIVE 04/09/2016 2122     RADIOLOGY: Dg Chest 2 View  Result Date: 09/08/2016 CLINICAL DATA:  Generalized weakness.  On dialysis. EXAM: CHEST  2 VIEW COMPARISON:  04/09/2016 FINDINGS: Right jugular dialysis catheter terminates over the right atrium. The cardiac silhouette is mildly enlarged, less prominent than on the prior study. 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 There is mild peribronchial cuffing. No confluent airspace opacity, overt alveolar edema, pleural effusion, or pneumothorax is identified. No acute osseous abnormality is seen. IMPRESSION: Mild cardiomegaly and mild peribronchial cuffing which may reflect minimal edema. Electronically Signed   By: Logan Bores M.D.   On: 09/08/2016 14:22    EKG: Orders placed or performed during the hospital encounter of 09/08/16  . ED EKG  . ED EKG  . EKG 12-Lead  . EKG 12-Lead     IMPRESSION AND PLAN:  * Elevated troponin   Patient denies any chest pain and no EKG changes found.   We'll monitor on telemetry and follow serial troponin, I discussed with cardiologist about the case and he also feels as patient has end-stage renal disease and he was having possible gastroenteritis he might have demand ischemia.  * Viral gastroenteritis   Patient denies use of any oral antibiotics in last 1-2 months, his diarrhea seems to be stopped now as last episode was Thursday which was 2 days ago.   We will still try to get the stool sample if he has bowel movement and if it looks watery and will check for C. difficile when GI panel.   Supportive care with Zofran to help with nausea and vomiting.  * Generalized weakness   Likely secondary to his gastroenteritis, we will get physical therapy evaluation.  * End-stage renal disease on hemodialysis   Nephrology consult to help dialysis here.  * Diabetes   We'll continue his insulins as is taking at home and keep on sliding scale coverage.  All the records are reviewed and case discussed with ED provider. Management plans discussed with the patient, family and they are in agreement.  CODE STATUS: Full code. Code Status  History    Date Active Date Inactive Code Status Order ID Comments User Context   04/10/2016 12:35 AM 04/10/2016 12:35 AM Full Code [id number]  Harvie Bridge, DO Inpatient   04/10/2016 12:35 AM 04/11/2016  3:25 PM Full Code [id number]  Harvie Bridge, DO Inpatient     Discussed with patient's wife and daughter in the room and with cardiologist on the phone.  TOTAL TIME TAKING CARE OF THIS PATIENT: 45 minutes.    Vaughan Basta M.D on 09/08/2016   Between 7am to 6pm - Pager - [phone]  After 6pm go to [email or web address] - password [email or web address] [facility]  Office  [phone]  CC: Primary care physician; Marguerita Merles, MD   Note: This dictation was prepared with Dragon  dictation along with smaller phrase technology. Any transcriptional errors that result from this process are unintentional.

## 2016-09-08 NOTE — ED Provider Notes (Signed)
[facility] Emergency Department Provider Note  ____________________________________________  Time seen: Approximately 1:36 PM  I have reviewed the triage vital signs and the nursing notes.   HISTORY  Chief Complaint Weakness   HPI Terry Tyler is a 66 y.o. male with h/o ESRD on HD (MWF), DM, CAD s/p CABG, CVA, HTN, HLD, PAD who presents for evaluation of generalized weakness. Patient has had vomiting and diarrhea for 10 days multiple episodes daily. No hematemesis, no melena, no hematochezia, no coffee ground emesis. He has had chills but no fever. Last dialysis was yesterday. Patient reports no appetite and no energy for the last 10 days. No known sick contacts. No prior history of C. difficile or recent antibiotics use. He denies abdominal pain, chest pain, shortness of breath, headache, cough or congestion, rash, unilateral weakness or numbness, slurred speech.  Past Medical History:  Diagnosis Date  . CKD (chronic kidney disease)   . Coronary atherosclerosis of unspecified type of vessel, native or graft    Myocardial infarction in 2006. Cardiac catheterization showed significant three-vessel coronary artery disease. He underwent CABG at [facility]. Most recent cardiac catheterization in 2010 showed normal LV systolic function, patent grafts to OM1, RCA and LAD. Occluded SVG to diagonal.  . 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 Hyperlipidemia   . Hypertension   . Hypertension, renal disease   . Impotence of organic origin   . Keloid scar   . Neoplasm of uncertain behavior, site unspecified   . Peripheral vascular disease, unspecified   . Proteinuria   . Unspecified vitamin D deficiency     Patient Active Problem List   Diagnosis Date Noted  . Elevated troponin 09/08/2016  . CVA (cerebral vascular accident) ([place]) 04/09/2016  . PAD (peripheral artery disease) ([place]) 04/10/2012  . Coronary atherosclerosis of unspecified type  of vessel, native or graft   . Hyperlipidemia   . Hypertension     Past Surgical History:  Procedure Laterality Date  . CARDIAC CATHETERIZATION  12/2004   [facility]  . CARDIAC CATHETERIZATION  09/2008   [facility]  . CORONARY ARTERY BYPASS GRAFT  2006   [facility]  . keloid removal     right neck    Prior to Admission medications   Medication Sig Start Date End Date Taking? Authorizing Provider  aspirin 81 MG tablet Take 81 mg by mouth daily.   Yes Historical Provider, MD  clopidogrel (PLAVIX) 75 MG tablet Take 1 tablet (75 mg total) by mouth daily. 04/11/16  Yes Fritzi Mandes, MD  gabapentin (NEURONTIN) 300 MG capsule Take 300 mg by mouth 3 (three) times daily.   Yes Historical Provider, MD  hydrALAZINE (APRESOLINE) 50 MG tablet Take 50 mg by mouth 2 (two) times daily.   Yes Historical Provider, MD  insulin aspart (NOVOLOG) 100 UNIT/ML injection Inject 10-15 Units into the skin 3 (three) times daily before meals. Per sliding scale    Yes Historical Provider, MD  insulin glargine (LANTUS) 100 UNIT/ML injection Inject 0.25 mLs (25 Units total) into the skin at bedtime. Takes 80 units at bedtimed;Titrate 4 units every 4 days unitl FBG are in the 130s. Patient taking differently: Inject 25 Units into the skin at bedtime.  04/11/16  Yes Fritzi Mandes, MD  metoprolol succinate (TOPROL-XL) 25 MG 24 hr tablet Take 25 mg by mouth daily.   Yes Historical Provider, MD  rosuvastatin (CRESTOR) 20 MG tablet Take 20 mg by mouth at bedtime.  Yes Historical Provider, MD  sildenafil (VIAGRA) 50 MG tablet Takes 1-2 tablets as needed.   Yes Historical Provider, MD  calcitRIOL (ROCALTROL) 0.25 MCG capsule Take 0.25 mcg by mouth every other day.    Historical Provider, MD  Cholecalciferol (VITAMIN D3) 50000 UNITS CAPS Take 1 capsule by mouth once a week.     Historical Provider, MD  furosemide (LASIX) 40 MG tablet Take 40 mg by mouth 2 (two) times daily.    Historical Provider, MD  Insulin Pen Needle (PRODIGY  MINI PEN NEEDLES) 31G X 5 MM MISC by Does not apply route as directed.    Historical Provider, MD  lisinopril (PRINIVIL,ZESTRIL) 40 MG tablet Take 40 mg by mouth daily.    Historical Provider, MD    Allergies Lipitor [atorvastatin]  Family History  Problem Relation Age of Onset  . Hypertension Mother   . Hyperlipidemia Mother   . Heart disease Mother   . Heart disease Father   . Hyperlipidemia Father   . Hypertension Father     Social History Social History  Substance Use Topics  . Smoking status: Former Smoker    Packs/day: 0.50    Years: 0.00    Types: Cigarettes  . Smokeless tobacco: Never Used  . Alcohol use No     Comment: socially    Review of Systems  Constitutional: Negative for fever. + chills, generalized weakness Eyes: Negative for visual changes. ENT: Negative for sore throat. Neck: No neck pain  Cardiovascular: Negative for chest pain. Respiratory: Negative for shortness of breath. Gastrointestinal: Negative for abdominal pain. + vomiting, diarrhea, and anorexia Genitourinary: Negative for dysuria. Musculoskeletal: Negative for back pain. Skin: Negative for rash. Neurological: Negative for headaches, weakness or numbness. Psych: No SI or HI  ____________________________________________   PHYSICAL EXAM:  VITAL SIGNS: ED Triage Vitals  Enc Vitals Group     BP 09/08/16 1258 (!) 150/110     Pulse Rate 09/08/16 1256 80     Resp 09/08/16 1256 18     Temp 09/08/16 1256 99.1 F (37.3 C)     Temp Source 09/08/16 1256 Oral     SpO2 09/08/16 1258 98 %     Weight 09/08/16 1256 243 lb (110.2 kg)     Height 09/08/16 1256 6' (1.829 m)     Head Circumference --      Peak Flow --      Pain Score --      Pain Loc --      Pain Edu? --      Excl. in [place]? --     Constitutional: Alert and oriented. Well appearing and in no apparent distress. HEENT:      Head: Normocephalic and atraumatic.         Eyes: Conjunctivae are normal. Sclera is non-icteric. EOMI.  PERRL      Mouth/Throat: Mucous membranes are moist.       Neck: Supple with no signs of meningismus. Cardiovascular: Regular rate and rhythm. No murmurs, gallops, or rubs. 2+ symmetrical distal pulses are present in all extremities. No JVD. Respiratory: Normal respiratory effort. Lungs are clear to auscultation bilaterally. No wheezes, crackles, or rhonchi.  Gastrointestinal: Soft, non tender, and non distended with positive bowel sounds. No rebound or guarding. Genitourinary: No CVA tenderness. Musculoskeletal: Nontender with normal range of motion in all extremities. No edema, cyanosis, or erythema of extremities. Neurologic: Normal speech and language. Face is symmetric. Moving all extremities. No gross focal neurologic deficits are appreciated. Skin: Skin  is warm, dry and intact. No rash noted. Psychiatric: Mood and affect are normal. Speech and behavior are normal.  ____________________________________________   LABS (all labs ordered are listed, but only abnormal results are displayed)  Labs Reviewed  BASIC METABOLIC PANEL - Abnormal; Notable for the following:       Result Value   Sodium 134 (*)    Potassium 3.4 (*)    Chloride 93 (*)    Glucose, Bld 199 (*)    BUN 29 (*)    Creatinine, Ser 6.17 (*)    GFR calc non Af Amer 9 (*)    GFR calc Af Amer 10 (*)    All other components within normal limits  CBC - Abnormal; Notable for the following:    WBC 11.3 (*)    RBC 3.90 (*)    Hemoglobin 11.1 (*)    HCT 32.6 (*)    RDW 15.7 (*)    All other components within normal limits  TROPONIN I - Abnormal; Notable for the following:    Troponin I 1.51 (*)    All other components within normal limits  HEPATIC FUNCTION PANEL - Abnormal; Notable for the following:    Total Protein 8.6 (*)    ALT 12 (*)    All other components within normal limits  LIPID PANEL - Abnormal; Notable for the following:    Cholesterol 202 (*)    Triglycerides 228 (*)    HDL 29 (*)    VLDL 46 (*)      LDL Cholesterol 127 (*)    All other components within normal limits  TROPONIN I - Abnormal; Notable for the following:    Troponin I 1.41 (*)    All other components within normal limits  GLUCOSE, CAPILLARY - Abnormal; Notable for the following:    Glucose-Capillary 208 (*)    All other components within normal limits  C DIFFICILE QUICK SCREEN W PCR REFLEX  INFLUENZA PANEL BY PCR (TYPE A & B)  MAGNESIUM  URINALYSIS, COMPLETE (UACMP) WITH MICROSCOPIC  HEMOGLOBIN A1C  TROPONIN I  TROPONIN I  HIV ANTIBODY (ROUTINE TESTING)  BASIC METABOLIC PANEL  CBC  CBG MONITORING, ED   ____________________________________________  EKG  ED ECG REPORT I, Rudene Re, the attending physician, personally viewed and interpreted this ECG.  Normal sinus rhythm, rate of 83, normal QRS and PR intervals, prolonged QTC, left axis deviation, no ST elevations or depressions. T-wave inversions and 1 and aVL which are unchanged from prior. ____________________________________________  RADIOLOGY  CXR:  Mild cardiomegaly and mild peribronchial cuffing which may reflect minimal edema. ____________________________________________   PROCEDURES  Procedure(s) performed: None Procedures Critical Care performed: yes  CRITICAL CARE Performed by: Rudene Re  ?  Total critical care time: 35 min  Critical care time was exclusive of separately billable procedures and treating other patients.  Critical care was necessary to treat or prevent imminent or life-threatening deterioration.  Critical care was time spent personally by me on the following activities: development of treatment plan with patient and/or surrogate as well as nursing, discussions with consultants, evaluation of patient's response to treatment, examination of patient, obtaining history from patient or surrogate, ordering and performing treatments and interventions, ordering and review of laboratory studies, ordering and  review of radiographic studies, pulse oximetry and re-evaluation of patient's condition.  ____________________________________________   INITIAL IMPRESSION / ASSESSMENT AND PLAN / ED COURSE  66 y.o. male with h/o ESRD on HD (MWF), DM, CAD s/p CABG, CVA, HTN, HLD,  PAD who presents for evaluation of generalized weakness in the setting of 10 days of multiple daily episodes of vomiting and diarrhea. Patient is well-appearing, in no distress, has normal vital signs, low-grade temp of 99.75F. will check for flu. Chest x-ray to rule out pneumonia, basic blood work to rule out electrolyte abnormalities, C. Diff screening. Will give gentle hydration and IV zofran.   Clinical Course as of Sep 08 1916  Sat Sep 08, 2016  1448 Troponin is elevated at 1.51 most likely secondary to demand ischemia in the setting of gastroenteritis. EKG is unchanged from prior with no evidence of STEMI. Patient remains without any chest pain or shortness of breath. 4 aspirin has been ordered. Patient will be admitted to the hospitalist.  [CV]    Clinical Course User Index [CV] Rudene Re, MD    Pertinent labs & imaging results that were available during my care of the patient were reviewed by me and considered in my medical decision making (see chart for details).    ____________________________________________   FINAL CLINICAL IMPRESSION(S) / ED DIAGNOSES  Final diagnoses:  NSTEMI (non-ST elevated myocardial infarction) ([place])  Vomiting and diarrhea  Generalized weakness      NEW MEDICATIONS STARTED DURING THIS VISIT:  Current Discharge Medication List       Note:  This document was prepared using Dragon voice recognition software and may include unintentional dictation errors.    Rudene Re, MD 09/08/16 [phone]

## 2016-09-08 NOTE — ED Notes (Signed)
Fluids stopped per MD.

## 2016-09-08 NOTE — ED Notes (Signed)
Lab result of trop of 1.51, results given to felicia rn

## 2016-09-09 LAB — BASIC METABOLIC PANEL
Anion gap: 11 (ref 5–15)
BUN: 37 mg/dL — ABNORMAL HIGH (ref 6–20)
CO2: 29 mmol/L (ref 22–32)
Calcium: 8.5 mg/dL — ABNORMAL LOW (ref 8.9–10.3)
Chloride: 92 mmol/L — ABNORMAL LOW (ref 101–111)
Creatinine, Ser: 7.11 mg/dL — ABNORMAL HIGH (ref 0.61–1.24)
GFR, EST AFRICAN AMERICAN: 8 mL/min — AB (ref >60)
GFR, EST NON AFRICAN AMERICAN: 7 mL/min — AB (ref >60)
GLUCOSE: 217 mg/dL — AB (ref 65–99)
POTASSIUM: 3.1 mmol/L — AB (ref 3.5–5.1)
SODIUM: 132 mmol/L — AB (ref 135–145)

## 2016-09-09 LAB — CBC
HEMATOCRIT: 28.9 % — AB (ref 40.0–52.0)
HEMOGLOBIN: 9.8 g/dL — AB (ref 13.0–18.0)
MCH: 28.4 pg (ref 26.0–34.0)
MCHC: 34 g/dL (ref 32.0–36.0)
MCV: 83.3 fL (ref 80.0–100.0)
Platelets: 184 10*3/uL (ref 150–440)
RBC: 3.47 MIL/uL — AB (ref 4.40–5.90)
RDW: 15.9 % — ABNORMAL HIGH (ref 11.5–14.5)
WBC: 8.9 10*3/uL (ref 3.8–10.6)

## 2016-09-09 LAB — URINALYSIS, COMPLETE (UACMP) WITH MICROSCOPIC
BILIRUBIN URINE: NEGATIVE
Bacteria, UA: NONE SEEN
Glucose, UA: >=500 mg/dL — AB
HGB URINE DIPSTICK: NEGATIVE
Ketones, ur: NEGATIVE mg/dL
LEUKOCYTES UA: NEGATIVE
NITRITE: NEGATIVE
PH: 7 (ref 5.0–8.0)
Protein, ur: 100 mg/dL — AB
SPECIFIC GRAVITY, URINE: 1.008 (ref 1.005–1.030)

## 2016-09-09 LAB — C DIFFICILE QUICK SCREEN W PCR REFLEX
C Diff antigen: NEGATIVE
C Diff interpretation: NOT DETECTED
C Diff toxin: NEGATIVE

## 2016-09-09 LAB — GLUCOSE, CAPILLARY
GLUCOSE-CAPILLARY: 214 mg/dL — AB (ref 65–99)
Glucose-Capillary: 215 mg/dL — ABNORMAL HIGH (ref 65–99)
Glucose-Capillary: 126 mg/dL — ABNORMAL HIGH (ref 65–99)
Glucose-Capillary: 210 mg/dL — ABNORMAL HIGH (ref 65–99)
Glucose-Capillary: 188 mg/dL — ABNORMAL HIGH (ref 65–99)

## 2016-09-09 LAB — TROPONIN I: Troponin I: 0.98 ng/mL (ref <0.03)

## 2016-09-09 LAB — HEMOGLOBIN A1C
Hgb A1c MFr Bld: 7.9 % — ABNORMAL HIGH (ref 4.8–5.6)
Mean Plasma Glucose: 180 mg/dL

## 2016-09-09 MED ORDER — SODIUM CHLORIDE 0.9 % WEIGHT BASED INFUSION
1.0000 mL/kg/h | INTRAVENOUS | Status: DC
  Administered 2016-09-10: 1 mL/kg/h via INTRAVENOUS

## 2016-09-09 MED ORDER — SODIUM CHLORIDE 0.9 % WEIGHT BASED INFUSION
3.0000 mL/kg/h | INTRAVENOUS | Status: AC
  Administered 2016-09-10: 3 mL/kg/h via INTRAVENOUS

## 2016-09-09 MED ORDER — SODIUM CHLORIDE 0.9 % IV SOLN: 250.0000 mL | INTRAVENOUS | Status: DC | PRN

## 2016-09-09 MED ORDER — SODIUM CHLORIDE 0.9% FLUSH: 3.0000 mL | INTRAVENOUS | Status: DC | PRN

## 2016-09-09 MED ORDER — SODIUM CHLORIDE 0.9% FLUSH
3.0000 mL | Freq: Two times a day (BID) | INTRAVENOUS | Status: DC
  Administered 2016-09-10: 3 mL via INTRAVENOUS

## 2016-09-09 MED ORDER — ASPIRIN 81 MG PO CHEW
81.0000 mg | CHEWABLE_TABLET | ORAL | Status: AC
  Administered 2016-09-10: 81 mg via ORAL
  Filled 2016-09-09: qty 1

## 2016-09-09 NOTE — Evaluation (Signed)
Physical Therapy Evaluation Patient Details Name: Terry Tyler MRN: [id number] DOB: 1951/03/18 Today's Date: 09/09/2016   History of Present Illness  Terry Tyler is a 66yo black person who identifies as male who comes to [facility] on 3/31 after 10 days of intermittent diarrhea and vomitting. Since arrival Tr-I noted as 1.41,1.22, 0.98, cardiology attributing to ischemic demand. PMH: CABG 2006 c LVEF 33%, Lt Pons CVA (2017), DM, HTN, PVD, Systolic Heart Failure, HLD, and ESRD on HD MWF. At baseline the patient AMB mostly household distances, occasional limited community distances for HD weekly. He uses a SPC or RW for stability, however no falls history in past 6 months. He is independent with basic ADL, but does have assistance for IADL from wife. He lives in a [place] home with 5 steps to enter and two railings.   Clinical Impression  Pt admitted with above diagnosis. Pt currently with functional limitations due to the deficits listed below (see "PT Problem List"). Upon entry, the patient is received semirecumbent in bed, no family/caregiver present. The pt is awake and agreeable to participate. No acute distress noted at this time, pt only reporting some mild, fatigue. VSS during eval. The pt is alert and oriented x3, pleasant, conversational, and following simple and multi-step commands consistently. Functional mobility assessment demonstrates mild weakness, near baseline, the pt now performing 5xSTS: 16 seconds, whereas age-matched norm is closer to 10 seconds. Pt performs all functional mobility independently and safely without AD. The patient reports to feel mildly weaker than his usual state, and is encouraged to remain active while admitted to avoid further decline. He is also encouraged to increase his baseline activity level at home at DC. No additional skilled PT services needed at this time. PT signing off.   Pt will benefit from skilled PT intervention to increase independence and safety with  basic mobility in preparation for discharge to the venue listed below.       Follow Up Recommendations No PT follow up    Equipment Recommendations  None recommended by PT    Recommendations for Other Services       Precautions / Restrictions Precautions Precautions: None      Mobility  Bed Mobility Overal bed mobility: Independent                Transfers Overall transfer level: Independent               General transfer comment: 5xSTS: 16s, hands free  Ambulation/Gait Ambulation/Gait assistance: Modified independent (Device/Increase time) Ambulation Distance (Feet): 100 Feet (in room, includes bilat turns and retroAMB. ) Assistive device: None       General Gait Details: distance limited by PT; pt reports he feels slightly weaker than compared to 1MA.   Stairs            Wheelchair Mobility    Modified Rankin (Stroke Patients Only)       Balance Overall balance assessment: Independent;No apparent balance deficits (not formally assessed)                                           Pertinent Vitals/Pain Pain Assessment: No/denies pain    Home Living                        Prior Function  Hand Dominance        Extremity/Trunk Assessment   Upper Extremity Assessment Upper Extremity Assessment: Generalized weakness    Lower Extremity Assessment Lower Extremity Assessment: Generalized weakness (5xSTS: 16 seconds hands free. )       Communication      Cognition Arousal/Alertness: Awake/alert Behavior During Therapy: WFL for tasks assessed/performed Overall Cognitive Status: Within Functional Limits for tasks assessed                                        General Comments      Exercises     Assessment/Plan    PT Assessment Patent does not need any further PT services  PT Problem List         PT Treatment Interventions      PT Goals (Current goals  can be found in the Care Plan section)  Acute Rehab PT Goals PT Goal Formulation: All assessment and education complete, DC therapy    Frequency     Barriers to discharge        Co-evaluation               End of Session Equipment Utilized During Treatment: Gait belt Activity Tolerance: Patient tolerated treatment well;Patient limited by fatigue Patient left: in bed;with nursing/sitter in room;with family/visitor present;with call bell/phone within reach Nurse Communication: Mobility status PT Visit Diagnosis: Muscle weakness (generalized) (M62.81);Difficulty in walking, not elsewhere classified (R26.2)    Time: 0277-4128 PT Time Calculation (min) (ACUTE ONLY): 11 min   Charges:   PT Evaluation $PT Eval Moderate Complexity: 1 Procedure     PT G Codes:   PT G-Codes **NOT FOR INPATIENT CLASS** Functional Assessment Tool Used: AM-PAC 6 Clicks Basic Mobility Functional Limitation: Mobility: Walking and moving around Mobility: Walking and Moving Around Current Status (N8676): At least 20 percent but less than 40 percent impaired, limited or restricted Mobility: Walking and Moving Around Goal Status [id number]): At least 20 percent but less than 40 percent impaired, limited or restricted Mobility: Walking and Moving Around Discharge Status [phone]): At least 20 percent but less than 40 percent impaired, limited or restricted    10:41 AM, 09/09/16 Etta Grandchild, PT, DPT Physical Therapist - [facility])  [phone] (mobile)    Paulanthony Gleaves C 09/09/2016, 10:38 AM

## 2016-09-09 NOTE — Progress Notes (Signed)
Subjective:   Patient known to [place] from prior admissions He has now started dialysis at [facility] He reports cramping during dialysis as outpatient. He came in for feeling poorly for the past week which has been progressively getting worse. He started to have shortness of breath that started on Friday evening. Evaluation in the hospital shows elevated troponins. He is scheduled for cardiac catheterization tomorrow morning.    Objective:  Vital signs in last 24 hours:  Temp:  [98.5 F (36.9 C)-99.1 F (37.3 C)] 98.9 F (37.2 C) (04/01 0800) Pulse Rate:  [72-105] 105 (04/01 1033) Resp:  [12-18] 16 (04/01 0800) BP: (115-156)/(63-110) 115/87 (04/01 0800) SpO2:  [92 %-98 %] 94 % (04/01 0800) Weight:  [110.2 kg (243 lb)] 110.2 kg (243 lb) (03/31 1256)  Weight change:  Filed Weights   09/08/16 1256  Weight: 110.2 kg (243 lb)    Intake/Output:    Intake/Output Summary (Last 24 hours) at 09/09/16 1036 Last data filed at 09/09/16 0300  Gross per 24 hour  Intake                0 ml  Output                0 ml  Net                0 ml     Physical Exam: General: No acute distress, laying in the bed  HEENT Anicteric, moist oral mucous membranes  Neck Supple  Pulm/lungs Normal breathing effort, clear to auscultation, decreased breath sounds at bases  CVS/Heart No rub or gallop  Abdomen:  Soft, obese, nontender, nondistended  Extremities: No peripheral edema  Neurologic: Alert, oriented  Skin: No acute rashes  Access: Right IJ PermCath       Basic Metabolic Panel:   Recent Labs Lab 09/08/16 1301 09/09/16 0027  NA 134* 132*  K 3.4* 3.1*  CL 93* 92*  CO2 27 29  GLUCOSE 199* 217*  BUN 29* 37*  CREATININE 6.17* 7.11*  CALCIUM 9.2 8.5*  MG 1.7  --      CBC:  Recent Labs Lab 09/08/16 1301 09/09/16 0027  WBC 11.3* 8.9  HGB 11.1* 9.8*  HCT 32.6* 28.9*  MCV 83.5 83.3  PLT 189 184      Microbiology:  No results found for this or any previous  visit (from the past 720 hour(s)).  Coagulation Studies: No results for input(s): LABPROT, INR in the last 72 hours.  Urinalysis: No results for input(s): COLORURINE, LABSPEC, PHURINE, GLUCOSEU, HGBUR, BILIRUBINUR, KETONESUR, PROTEINUR, UROBILINOGEN, NITRITE, LEUKOCYTESUR in the last 72 hours.  Invalid input(s): APPERANCEUR    Imaging: Dg Chest 2 View  Result Date: 09/08/2016 CLINICAL DATA:  Generalized weakness.  On dialysis. EXAM: CHEST  2 VIEW COMPARISON:  04/09/2016 FINDINGS: Right jugular dialysis catheter terminates over the right atrium. The cardiac silhouette is mildly enlarged, less prominent than on the prior study. 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 There is mild peribronchial cuffing. No confluent airspace opacity, overt alveolar edema, pleural effusion, or pneumothorax is identified. No acute osseous abnormality is seen. IMPRESSION: Mild cardiomegaly and mild peribronchial cuffing which may reflect minimal edema. Electronically Signed   By: Logan Bores M.D.   On: 09/08/2016 14:22     Medications:    . aspirin EC  81 mg Oral QPM  . calcitRIOL  0.25 mcg Oral QODAY  . clopidogrel  75 mg Oral QPM  . gabapentin  300 mg Oral  TID  . heparin  5,000 Units Subcutaneous Q8H  . hydrALAZINE  50 mg Oral BID  . insulin aspart  0-9 Units Subcutaneous TID WC  . insulin aspart  10 Units Subcutaneous TID AC  . insulin glargine  20 Units Subcutaneous QHS  . lisinopril  40 mg Oral Daily  . metoprolol succinate  25 mg Oral QPM  . rosuvastatin  20 mg Oral QHS  . sodium chloride flush  3 mL Intravenous Q12H   docusate sodium, ondansetron (ZOFRAN) IV  Assessment/ Plan:  66 y.o.African-American  male with long-standing diabetes, hypertension, coronary disease, CABG in 2006, ESRD    1. ESRD, [place] hemodialysis, MWF, [facility] nephrology We will arrange for hemodialysis while patient is in the hospital  2. Shortness of breath with positive troponins/NSTEMI - Scheduled for  cardiac catheterization in the morning We will plan for hemodialysis after his heart catheterization  3. DM-2 with CKD -  continue good blood sugar control  3. SHPTH - monitor phos    LOS: 0 Faithanne Verret 4/1/201810:36 AM

## 2016-09-09 NOTE — Progress Notes (Signed)
Consent signed and placed on chart, cath pamphlet given to pt.

## 2016-09-09 NOTE — Consult Note (Signed)
[facility]  CARDIOLOGY CONSULT NOTE  Patient ID: Terry Tyler MRN: [id number] DOB/AGE: 1950/09/11 66 y.o.  Admit date: 09/08/2016 Referring Physician Bridgett Larsson Primary Physician [facility] Primary Cardiologist Nehemiah Massed Reason for Consultation Elevated troponin  HPI: 66 year old gentleman referred for evaluation of elevated troponin. The patient has known coronary artery disease, status post CABG at [facility] 2006. Lexiscan Myoview 04/20/2016 revealed LVEF 33% with inferolateral wall scar. Patient has a one to two-week history of well, intermittent diarrhea, vomiting, with shortness of breath. Patient denies chest pain. Labs were notable for elevated troponin of 1.51. Follow-up troponin was 1.22. ECG did not reveal any ischemic ischemic ST-T wave changes. The patient has known end-stage renal disease on chronic hemodialysis.  Review of systems complete and found to be negative unless listed above     Past Medical History:  Diagnosis Date  . CKD (chronic kidney disease)   . Coronary atherosclerosis of unspecified type of vessel, native or graft    Myocardial infarction in 2006. Cardiac catheterization showed significant three-vessel coronary artery disease. He underwent CABG at [facility]. Most recent cardiac catheterization in 2010 showed normal LV systolic function, patent grafts to OM1, RCA and LAD. Occluded SVG to diagonal.  . 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 Hyperlipidemia   . Hypertension   . Hypertension, renal disease   . Impotence of organic origin   . Keloid scar   . Neoplasm of uncertain behavior, site unspecified   . Peripheral vascular disease, unspecified   . Proteinuria   . Unspecified vitamin D deficiency     Past Surgical History:  Procedure Laterality Date  . CARDIAC CATHETERIZATION  12/2004   [facility];[place]  . CARDIAC CATHETERIZATION  09/2008   [facility];[place]  . CORONARY ARTERY BYPASS GRAFT  2006   [facility]  . keloid removal     right  neck    Prescriptions Prior to Admission  Medication Sig Dispense Refill Last Dose  . aspirin 81 MG tablet Take 81 mg by mouth daily.   09/06/2016 at 2100  . clopidogrel (PLAVIX) 75 MG tablet Take 1 tablet (75 mg total) by mouth daily. 30 tablet 1 09/06/2016 at 2100  . gabapentin (NEURONTIN) 300 MG capsule Take 300 mg by mouth 3 (three) times daily.   09/06/2016 at 2100  . hydrALAZINE (APRESOLINE) 50 MG tablet Take 50 mg by mouth 2 (two) times daily.   09/06/2016 at 2100  . insulin aspart (NOVOLOG) 100 UNIT/ML injection Inject 10-15 Units into the skin 3 (three) times daily before meals. Per sliding scale    09/07/2016  . insulin glargine (LANTUS) 100 UNIT/ML injection Inject 0.25 mLs (25 Units total) into the skin at bedtime. Takes 80 units at bedtimed;Titrate 4 units every 4 days unitl FBG are in the 130s. (Patient taking differently: Inject 25 Units into the skin at bedtime. ) 10 mL 11 09/07/2016 at Unknown time  . metoprolol succinate (TOPROL-XL) 25 MG 24 hr tablet Take 25 mg by mouth daily.   09/06/2016 at 2100  . rosuvastatin (CRESTOR) 20 MG tablet Take 20 mg by mouth at bedtime.    09/06/2016 at 2100  . sildenafil (VIAGRA) 50 MG tablet Takes 1-2 tablets as needed.   prn at prn  . calcitRIOL (ROCALTROL) 0.25 MCG capsule Take 0.25 mcg by mouth every other day.   Not Taking at Unknown time  . Cholecalciferol (VITAMIN D3) 50000 UNITS CAPS Take 1 capsule by mouth once a week.    Not Taking  at Unknown time  . furosemide (LASIX) 40 MG tablet Take 40 mg by mouth 2 (two) times daily.   Not Taking at Unknown time  . Insulin Pen Needle (PRODIGY MINI PEN NEEDLES) 31G X 5 MM MISC by Does not apply route as directed.   Taking  . lisinopril (PRINIVIL,ZESTRIL) 40 MG tablet Take 40 mg by mouth daily.   Not Taking at Unknown time   Social History   Social History  . Marital status: Married    Spouse name: N/A  . Number of children: N/A  . Years of education: N/A   Occupational History  . Not on file.    Social History Main Topics  . Smoking status: Former Smoker    Packs/day: 0.50    Years: 0.00    Types: Cigarettes  . Smokeless tobacco: Never Used  . Alcohol use No     Comment: socially  . Drug use: No  . Sexual activity: Not Currently   Other Topics Concern  . Not on file   Social History Narrative  . No narrative on file    Family History  Problem Relation Age of Onset  . Hypertension Mother   . Hyperlipidemia Mother   . Heart disease Mother   . Heart disease Father   . Hyperlipidemia Father   . Hypertension Father       Review of systems complete and found to be negative unless listed above      PHYSICAL EXAM  General: Well developed, well nourished, in no acute distress HEENT:  Normocephalic and atramatic Neck:  No JVD.  Lungs: Clear bilaterally to auscultation and percussion. Heart: HRRR . Normal S1 and S2 without gallops or murmurs.  Abdomen: Bowel sounds are positive, abdomen soft and non-tender  Msk:  Back normal, normal gait. Normal strength and tone for age. Extremities: No clubbing, cyanosis or edema.   Neuro: Alert and oriented X 3. Psych:  Good affect, responds appropriately  Labs:   Lab Results  Component Value Date   WBC 8.9 09/09/2016   HGB 9.8 (L) 09/09/2016   HCT 28.9 (L) 09/09/2016   MCV 83.3 09/09/2016   PLT 184 09/09/2016    Recent Labs Lab 09/08/16 1301 09/09/16 0027  NA 134* 132*  K 3.4* 3.1*  CL 93* 92*  CO2 27 29  BUN 29* 37*  CREATININE 6.17* 7.11*  CALCIUM 9.2 8.5*  PROT 8.6*  --   BILITOT 0.9  --   ALKPHOS 94  --   ALT 12*  --   AST 21  --   GLUCOSE 199* 217*   Lab Results  Component Value Date   TROPONINI 0.98 (HH) 09/09/2016    Lab Results  Component Value Date   CHOL 202 (H) 09/08/2016   CHOL 96 04/10/2016   Lab Results  Component Value Date   HDL 29 (L) 09/08/2016   HDL 37 (L) 04/10/2016   Lab Results  Component Value Date   LDLCALC 127 (H) 09/08/2016   LDLCALC 47 04/10/2016   Lab  Results  Component Value Date   TRIG 228 (H) 09/08/2016   TRIG 62 04/10/2016   Lab Results  Component Value Date   CHOLHDL 7.0 09/08/2016   CHOLHDL 2.6 04/10/2016   No results found for: LDLDIRECT    Radiology: Dg Chest 2 View  Result Date: 09/08/2016 CLINICAL DATA:  Generalized weakness.  On dialysis. EXAM: CHEST  2 VIEW COMPARISON:  04/09/2016 FINDINGS: Right jugular dialysis catheter terminates over the right atrium.  The cardiac silhouette is mildly enlarged, less prominent than on the prior study. 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 There is mild peribronchial cuffing. No confluent airspace opacity, overt alveolar edema, pleural effusion, or pneumothorax is identified. No acute osseous abnormality is seen. IMPRESSION: Mild cardiomegaly and mild peribronchial cuffing which may reflect minimal edema. Electronically Signed   By: Logan Bores M.D.   On: 09/08/2016 14:22    EKG: Sinus rhythm  ASSESSMENT AND PLAN:   1. Elevated troponin, possible unstable angina versus NSTEMI,  known history of coronary artery disease status post CABG, with ischemic cardiomyopathy 2. End-stage renal disease, on chronic hemodialysis  Recommendations  1. Agree with current therapy 2. Defer heparin drip for now 3. Cardiac catheterization with selective coronary arteriography scheduled for 09/10/2016. The risks, benefits alternatives to cardiac catheterization were explained to the patient and informed written consent was obtained.  Signed: Isaias Cowman MD,PhD, [facility] 09/09/2016, 9:37 AM

## 2016-09-09 NOTE — Progress Notes (Signed)
[place] [facility] [place] NAME: Terry Tyler    MR#:  [id number]  DATE OF BIRTH:  August 17, 1950  SUBJECTIVE:  CHIEF COMPLAINT:   Chief Complaint  Patient presents with  . Weakness   No complaints. REVIEW OF SYSTEMS:  Review of Systems  Constitutional: Negative for chills, fever and malaise/fatigue.  HENT: Negative for congestion.   Eyes: Negative for blurred vision and double vision.  Respiratory: Negative for cough, shortness of breath, wheezing and stridor.   Cardiovascular: Negative for chest pain, palpitations and leg swelling.  Gastrointestinal: Negative for abdominal pain, blood in stool, diarrhea, melena, nausea and vomiting.  Genitourinary: Negative for dysuria, hematuria and urgency.  Musculoskeletal: Negative for back pain.  Skin: Negative for itching and rash.  Neurological: Negative for dizziness, focal weakness, loss of consciousness, weakness and headaches.  Psychiatric/Behavioral: Negative for depression. The patient is not nervous/anxious.     DRUG ALLERGIES:   Allergies  Allergen Reactions  . Lipitor [Atorvastatin] Other (See Comments)    Muscle cramping   VITALS:  Blood pressure 115/87, pulse (!) 105, temperature 98.9 F (37.2 C), temperature source Oral, resp. rate 16, height 6' (1.829 m), weight 243 lb (110.2 kg), SpO2 94 %. PHYSICAL EXAMINATION:  Physical Exam  Constitutional: He is oriented to person, place, and time and well-developed, well-nourished, and in no distress.  HENT:  Head: Normocephalic.  Mouth/Throat: Oropharynx is clear and moist.  Eyes: Conjunctivae and EOM are normal.  Neck: Normal range of motion. Neck supple. No JVD present. No tracheal deviation present.  Cardiovascular: Normal rate, regular rhythm and normal heart sounds.  Exam reveals no gallop.   No murmur heard. Pulmonary/Chest: Effort normal and breath sounds normal. No respiratory distress. He has no wheezes. He has no rales.    Abdominal: Soft. Bowel sounds are normal. He exhibits no distension. There is no tenderness.  Musculoskeletal: Normal range of motion. He exhibits no edema or tenderness.  Neurological: He is alert and oriented to person, place, and time. No cranial nerve deficit.  Skin: No rash noted. No erythema.  Psychiatric: Affect and judgment normal.   LABORATORY PANEL:  Male CBC  Recent Labs Lab 09/09/16 0027  WBC 8.9  HGB 9.8*  HCT 28.9*  PLT 184   ------------------------------------------------------------------------------------------------------------------ Chemistries   Recent Labs Lab 09/08/16 1301 09/09/16 0027  NA 134* 132*  K 3.4* 3.1*  CL 93* 92*  CO2 27 29  GLUCOSE 199* 217*  BUN 29* 37*  CREATININE 6.17* 7.11*  CALCIUM 9.2 8.5*  MG 1.7  --   AST 21  --   ALT 12*  --   ALKPHOS 94  --   BILITOT 0.9  --    RADIOLOGY:  Dg Chest 2 View  Result Date: 09/08/2016 CLINICAL DATA:  Generalized weakness.  On dialysis. EXAM: CHEST  2 VIEW COMPARISON:  04/09/2016 FINDINGS: Right jugular dialysis catheter terminates over the right atrium. The cardiac silhouette is mildly enlarged, less prominent than on the prior study. 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 There is mild peribronchial cuffing. No confluent airspace opacity, overt alveolar edema, pleural effusion, or pneumothorax is identified. No acute osseous abnormality is seen. IMPRESSION: Mild cardiomegaly and mild peribronchial cuffing which may reflect minimal edema. Electronically Signed   By: Logan Bores M.D.   On: 09/08/2016 14:22   ASSESSMENT AND PLAN:   * Elevated troponin, possible unstable angina versus NSTEMI,  known history of coronary artery disease status post CABG, with  ischemic cardiomyopathy. Defer heparin drip for now, Cardiac catheterization with selective coronary arteriography scheduled for 09/10/2016 per Dr. Saralyn Pilar. Continue aspirin, Plavix and Crestor.  * Viral gastroenteritis   Patient  denies use of any oral antibiotics in last 1-2 months, his diarrhea seems to be stopped now as last episode was Thursday which was 2 days ago.    improved.  * Generalized weakness   Likely secondary to his gastroenteritis. No PT follow-up per PT evaluation.  * End-stage renal disease on hemodialysis   hemodialysis while patient is in the hospital per Dr. Candiss Norse.  * Diabetes on sliding scale coverage.  Hypokalemia. Give potassium supplement. I discussed with Dr. Saralyn Pilar and Dr. Candiss Norse.  All the records are reviewed and case discussed with Care Management/Social Worker. Management plans discussed with the patient, his wife and they are in agreement.  CODE STATUS: Full Code  TOTAL TIME TAKING CARE OF THIS PATIENT: 33 minutes.   More than 50% of the time was spent in counseling/coordination of care: YES  POSSIBLE D/C IN 1-2 DAYS, DEPENDING ON CLINICAL CONDITION.   Demetrios Loll M.D on 09/09/2016 at 1:12 PM  Between 7am to 6pm - Pager - [phone]  After 6pm go to [email or web address] - Proofreader  [facility]  Office  [phone]  CC: Primary care physician; Marguerita Merles, MD  Note: This dictation was prepared with Dragon dictation along with smaller phrase technology. Any transcriptional errors that result from this process are unintentional.

## 2016-09-09 NOTE — Progress Notes (Signed)
Dr. Jannifer Franklin paged for K+ 3.1, no new orders

## 2016-09-09 NOTE — Progress Notes (Signed)
Bilateral groin prep completed

## 2016-09-10 ENCOUNTER — Ambulatory Visit
Admission: EM | Disposition: A | Discharge status: Home / Self Care | Payer: Self-pay | Source: Home / Self Care | Attending: Emergency Medicine

## 2016-09-10 HISTORY — PX: LEFT HEART CATH AND CORONARY ANGIOGRAPHY: CATH118249

## 2016-09-10 HISTORY — PX: CORONARY STENT INTERVENTION: CATH118234

## 2016-09-10 LAB — GLUCOSE, CAPILLARY
GLUCOSE-CAPILLARY: 154 mg/dL — AB (ref 65–99)
GLUCOSE-CAPILLARY: 140 mg/dL — AB (ref 65–99)
Glucose-Capillary: 118 mg/dL — ABNORMAL HIGH (ref 65–99)

## 2016-09-10 LAB — HIV ANTIBODY (ROUTINE TESTING W REFLEX): HIV SCREEN 4TH GENERATION: NONREACTIVE

## 2016-09-10 LAB — POCT ACTIVATED CLOTTING TIME: ACTIVATED CLOTTING TIME: 290 s

## 2016-09-10 SURGERY — LEFT HEART CATH AND CORONARY ANGIOGRAPHY
Anesthesia: Moderate Sedation

## 2016-09-10 MED ORDER — FENTANYL CITRATE (PF) 100 MCG/2ML IJ SOLN
INTRAMUSCULAR | Status: DC | PRN
  Administered 2016-09-10: 50 ug via INTRAVENOUS

## 2016-09-10 MED ORDER — LABETALOL HCL 5 MG/ML IV SOLN: 10.0000 mg | INTRAVENOUS | Status: AC | PRN

## 2016-09-10 MED ORDER — CLOPIDOGREL BISULFATE 75 MG PO TABS
75.0000 mg | ORAL_TABLET | Freq: Every day | ORAL | Status: DC
  Administered 2016-09-11: 75 mg via ORAL
  Filled 2016-09-10: qty 1

## 2016-09-10 MED ORDER — TIROFIBAN HCL IN NACL 5-0.9 MG/100ML-% IV SOLN
0.0750 ug/kg/min | INTRAVENOUS | Status: DC
  Administered 2016-09-10 (×2): 0.075 ug/kg/min via INTRAVENOUS
  Filled 2016-09-10 (×3): qty 100

## 2016-09-10 MED ORDER — SODIUM CHLORIDE 0.9% FLUSH
3.0000 mL | Freq: Two times a day (BID) | INTRAVENOUS | Status: DC
  Administered 2016-09-11: 3 mL via INTRAVENOUS

## 2016-09-10 MED ORDER — TIROFIBAN HCL IN NACL 5-0.9 MG/100ML-% IV SOLN
INTRAVENOUS | Status: DC | PRN
  Administered 2016-09-10: 0.075 ug/kg/min via INTRAVENOUS

## 2016-09-10 MED ORDER — FENTANYL CITRATE (PF) 100 MCG/2ML IJ SOLN
INTRAMUSCULAR | Status: AC
  Filled 2016-09-10: qty 2

## 2016-09-10 MED ORDER — LIDOCAINE HCL (PF) 1 % IJ SOLN
INTRAMUSCULAR | Status: AC
  Filled 2016-09-10: qty 30

## 2016-09-10 MED ORDER — ONDANSETRON HCL 4 MG/2ML IJ SOLN: 4.0000 mg | Freq: Four times a day (QID) | INTRAMUSCULAR | Status: DC | PRN

## 2016-09-10 MED ORDER — ASPIRIN 81 MG PO CHEW
81.0000 mg | CHEWABLE_TABLET | Freq: Every day | ORAL | Status: DC
  Administered 2016-09-11: 81 mg via ORAL
  Filled 2016-09-10: qty 1

## 2016-09-10 MED ORDER — TIROFIBAN HCL IN NACL 5-0.9 MG/100ML-% IV SOLN
INTRAVENOUS | Status: AC
  Filled 2016-09-10: qty 100

## 2016-09-10 MED ORDER — ASPIRIN 81 MG PO CHEW
CHEWABLE_TABLET | ORAL | Status: DC | PRN
  Administered 2016-09-10: 243 mg via ORAL

## 2016-09-10 MED ORDER — IOPAMIDOL (ISOVUE-300) INJECTION 61%
INTRAVENOUS | Status: DC | PRN
  Administered 2016-09-10: 280 mL via INTRA_ARTERIAL

## 2016-09-10 MED ORDER — CLOPIDOGREL BISULFATE 300 MG PO TABS
ORAL_TABLET | ORAL | Status: AC
Start: 2016-09-10 — End: 2016-09-10
  Filled 2016-09-10: qty 2

## 2016-09-10 MED ORDER — HEPARIN (PORCINE) IN NACL 2-0.9 UNIT/ML-% IJ SOLN
INTRAMUSCULAR | Status: AC
  Filled 2016-09-10: qty 1000

## 2016-09-10 MED ORDER — NITROGLYCERIN 1 MG/10 ML FOR IR/CATH LAB
INTRA_ARTERIAL | Status: DC | PRN
  Administered 2016-09-10 (×2): 200 ug via INTRACORONARY

## 2016-09-10 MED ORDER — CLOPIDOGREL BISULFATE 75 MG PO TABS
ORAL_TABLET | ORAL | Status: DC | PRN
  Administered 2016-09-10: 600 mg via ORAL

## 2016-09-10 MED ORDER — MIDAZOLAM HCL 2 MG/2ML IJ SOLN
INTRAMUSCULAR | Status: AC
  Filled 2016-09-10: qty 2

## 2016-09-10 MED ORDER — NITROGLYCERIN 5 MG/ML IV SOLN
INTRAVENOUS | Status: AC
  Filled 2016-09-10: qty 10

## 2016-09-10 MED ORDER — SODIUM CHLORIDE 0.9 % IV SOLN
INTRAVENOUS | Status: DC | PRN
  Administered 2016-09-10: 1.75 mg/kg/h via INTRAVENOUS

## 2016-09-10 MED ORDER — HYDRALAZINE HCL 20 MG/ML IJ SOLN: 5.0000 mg | INTRAMUSCULAR | Status: AC | PRN

## 2016-09-10 MED ORDER — BIVALIRUDIN BOLUS VIA INFUSION - CUPID
INTRAVENOUS | Status: DC | PRN
  Administered 2016-09-10: 82.35 mg via INTRAVENOUS

## 2016-09-10 MED ORDER — SODIUM CHLORIDE 0.9 % IV SOLN: 250.0000 mL | INTRAVENOUS | Status: DC | PRN

## 2016-09-10 MED ORDER — BIVALIRUDIN 250 MG IV SOLR
INTRAVENOUS | Status: AC
  Filled 2016-09-10: qty 250

## 2016-09-10 MED ORDER — SODIUM CHLORIDE 0.9% FLUSH: 3.0000 mL | INTRAVENOUS | Status: DC | PRN

## 2016-09-10 MED ORDER — SODIUM CHLORIDE 0.9 % WEIGHT BASED INFUSION
1.0000 mL/kg/h | INTRAVENOUS | Status: AC
  Administered 2016-09-10: 1 mL/kg/h via INTRAVENOUS

## 2016-09-10 MED ORDER — MIDAZOLAM HCL 2 MG/2ML IJ SOLN
INTRAMUSCULAR | Status: DC | PRN
  Administered 2016-09-10: 0.5 mg via INTRAVENOUS

## 2016-09-10 MED ORDER — TIROFIBAN (AGGRASTAT) BOLUS VIA INFUSION
INTRAVENOUS | Status: DC | PRN
  Administered 2016-09-10: 2745 ug via INTRAVENOUS

## 2016-09-10 MED ORDER — ASPIRIN 81 MG PO CHEW
CHEWABLE_TABLET | ORAL | Status: AC
  Filled 2016-09-10: qty 4

## 2016-09-10 MED ORDER — ACETAMINOPHEN 325 MG PO TABS: 650.0000 mg | ORAL_TABLET | ORAL | Status: DC | PRN

## 2016-09-10 SURGICAL SUPPLY — 17 items
BALLN TREK RX 2.5X15 (BALLOONS) ×3
BALLOON TREK RX 2.5X15 (BALLOONS) ×1 IMPLANT
CATH 5FR JL4 DIAGNOSTIC (CATHETERS) ×3 IMPLANT
CATH INFINITI 5 FR IM (CATHETERS) ×3 IMPLANT
CATH INFINITI 5FR ANG PIGTAIL (CATHETERS) ×3 IMPLANT
CATH INFINITI JR4 5F (CATHETERS) ×3 IMPLANT
CATH VISTA GUIDE 6FR JR4 SH (CATHETERS) ×3 IMPLANT
DEVICE INFLAT 30 PLUS (MISCELLANEOUS) ×3 IMPLANT
KIT MANI 3VAL PERCEP (MISCELLANEOUS) ×3 IMPLANT
NEEDLE PERC 18GX7CM (NEEDLE) ×3 IMPLANT
PACK CARDIAC CATH (CUSTOM PROCEDURE TRAY) ×3 IMPLANT
SHEATH AVANTI 5FR X 11CM (SHEATH) ×3 IMPLANT
SHEATH AVANTI 6FR X 11CM (SHEATH) ×3 IMPLANT
STENT XIENCE ALPINE RX 3.5X23 (Permanent Stent) ×3 IMPLANT
WIRE ASAHI PROWATER 180CM (WIRE) ×3 IMPLANT
WIRE EMERALD 3MM-J .035X150CM (WIRE) ×3 IMPLANT
WIRE EMERALD 3MM-J .035X260CM (WIRE) ×3 IMPLANT

## 2016-09-10 NOTE — Progress Notes (Signed)
  End of hd 

## 2016-09-10 NOTE — Progress Notes (Signed)
Patient to HD 

## 2016-09-10 NOTE — Progress Notes (Signed)
Post hd assessment 

## 2016-09-10 NOTE — Progress Notes (Addendum)
[facility] [place] NAME: Terry Tyler    MR#:  [id number]  DATE OF BIRTH:  1950-08-20  SUBJECTIVE:  CHIEF COMPLAINT:   Chief Complaint  Patient presents with  . Weakness   No complaints.Status post cardiac catheter and PCI today. REVIEW OF SYSTEMS:  Review of Systems  Constitutional: Negative for chills, fever and malaise/fatigue.  HENT: Negative for congestion.   Eyes: Negative for blurred vision and double vision.  Respiratory: Negative for cough, shortness of breath, wheezing and stridor.   Cardiovascular: Negative for chest pain, palpitations and leg swelling.  Gastrointestinal: Negative for abdominal pain, blood in stool, diarrhea, melena, nausea and vomiting.  Genitourinary: Negative for dysuria, hematuria and urgency.  Musculoskeletal: Negative for back pain.  Skin: Negative for itching and rash.  Neurological: Negative for dizziness, focal weakness, loss of consciousness, weakness and headaches.  Psychiatric/Behavioral: Negative for depression. The patient is not nervous/anxious.     DRUG ALLERGIES:   Allergies  Allergen Reactions  . Lipitor [Atorvastatin] Other (See Comments)    Muscle cramping   VITALS:  Blood pressure (!) 155/72, pulse 61, temperature 97.7 F (36.5 C), temperature source Oral, resp. rate 16, height 6' (1.829 m), weight 241 lb 10 oz (109.6 kg), SpO2 100 %. PHYSICAL EXAMINATION:  Physical Exam  Constitutional: He is oriented to person, place, and time and well-developed, well-nourished, and in no distress.  HENT:  Head: Normocephalic.  Mouth/Throat: Oropharynx is clear and moist.  Eyes: Conjunctivae and EOM are normal.  Neck: Normal range of motion. Neck supple. No JVD present. No tracheal deviation present.  Cardiovascular: Normal rate, regular rhythm and normal heart sounds.  Exam reveals no gallop.   No murmur heard. Pulmonary/Chest: Effort normal and breath sounds normal. No respiratory  distress. He has no wheezes. He has no rales.  Abdominal: Soft. Bowel sounds are normal. He exhibits no distension. There is no tenderness.  Musculoskeletal: Normal range of motion. He exhibits no edema or tenderness.  Neurological: He is alert and oriented to person, place, and time. No cranial nerve deficit.  Skin: No rash noted. No erythema.  Psychiatric: Affect and judgment normal.   LABORATORY PANEL:  Male CBC  Recent Labs Lab 09/09/16 0027  WBC 8.9  HGB 9.8*  HCT 28.9*  PLT 184   ------------------------------------------------------------------------------------------------------------------ Chemistries   Recent Labs Lab 09/08/16 1301 09/09/16 0027  NA 134* 132*  K 3.4* 3.1*  CL 93* 92*  CO2 27 29  GLUCOSE 199* 217*  BUN 29* 37*  CREATININE 6.17* 7.11*  CALCIUM 9.2 8.5*  MG 1.7  --   AST 21  --   ALT 12*  --   ALKPHOS 94  --   BILITOT 0.9  --    RADIOLOGY:  No results found. ASSESSMENT AND PLAN:   * NSTEMI, status post DES ostium of the SVG to PDA, Severe three-vessel coronary artery disease, known history of coronary artery disease status post CABG, with ischemic cardiomyopathy.   Cardiac catheterization showed three-vessel disease, PCI, with DES ostial stenosis SVG to PDA per Dr. Saralyn Pilar. Continue aspirin, Plavix, Lopressor, lisinopril and Crestor.  * Viral gastroenteritis   Patient denies use of any oral antibiotics in last 1-2 months, his diarrhea seems to be stopped now as last episode was Thursday which was 2 days ago.    improved.  * Generalized weakness   Likely secondary to his gastroenteritis. No PT follow-up per PT evaluation.  * End-stage renal disease on hemodialysis  hemodialysis while patient is in the hospital per Dr. Candiss Norse.  * Diabetes on sliding scale coverage and lantus.  Hypokalemia. Give potassium supplement. I discussed with Dr. Holley Raring.  All the records are reviewed and case discussed with Care Management/Social  Worker. Management plans discussed with the patient, his wife and they are in agreement.  CODE STATUS: Full Code  TOTAL TIME TAKING CARE OF THIS PATIENT: 33 minutes.   More than 50% of the time was spent in counseling/coordination of care: YES  POSSIBLE D/C IN 1-2 DAYS, DEPENDING ON CLINICAL CONDITION.   Demetrios Loll M.D on 09/10/2016 at 2:39 PM  Between 7am to 6pm - Pager - [phone]  After 6pm go to [email or web address] - Proofreader  [facility]  Office  [phone]  CC: Primary care physician; Marguerita Merles, MD  Note: This dictation was prepared with Dragon dictation along with smaller phrase technology. Any transcriptional errors that result from this process are unintentional.

## 2016-09-10 NOTE — Progress Notes (Signed)
Pre hd assessment  

## 2016-09-10 NOTE — Progress Notes (Signed)
Subjective:  Patient had cardiac catheterization earlier today. Thereafter he had hemodialysis. He tolerated dialysis and cardiac catheterization quite well.   Objective:  Vital signs in last 24 hours:  Temp:  [97.7 F (36.5 C)-98.7 F (37.1 C)] 97.8 F (36.6 C) (04/02 1450) Pulse Rate:  [58-88] 65 (04/02 1454) Resp:  [0-20] 12 (04/02 1454) BP: (110-155)/(51-99) 151/80 (04/02 1454) SpO2:  [93 %-100 %] 100 % (04/02 1454) Weight:  [109.6 kg (241 lb 10 oz)-110.2 kg (242 lb 15.2 oz)] 109.6 kg (241 lb 10 oz) (04/02 1450)  Weight change: -0.024 kg (-0.9 oz) Filed Weights   09/10/16 0711 09/10/16 1153 09/10/16 1450  Weight: 109.8 kg (242 lb) 109.6 kg (241 lb 10 oz) 109.6 kg (241 lb 10 oz)    Intake/Output:    Intake/Output Summary (Last 24 hours) at 09/10/16 1526 Last data filed at 09/10/16 1450  Gross per 24 hour  Intake            645.9 ml  Output              -96 ml  Net            741.9 ml     Physical Exam: General: No acute distress  HEENT Anicteric, moist oral mucous membranes  Neck Supple  Pulm/lungs Normal breathing effort, clear to auscultation  CVS/Heart No rub or gallop, S1S2  Abdomen:  Soft, obese, nontender, nondistended  Extremities: No peripheral edema  Neurologic: Alert, oriented x 3, follows commands  Skin: No acute rashes  Access: Right IJ PermCath       Basic Metabolic Panel:   Recent Labs Lab 09/08/16 1301 09/09/16 0027  NA 134* 132*  K 3.4* 3.1*  CL 93* 92*  CO2 27 29  GLUCOSE 199* 217*  BUN 29* 37*  CREATININE 6.17* 7.11*  CALCIUM 9.2 8.5*  MG 1.7  --      CBC:  Recent Labs Lab 09/08/16 1301 09/09/16 0027  WBC 11.3* 8.9  HGB 11.1* 9.8*  HCT 32.6* 28.9*  MCV 83.5 83.3  PLT 189 184      Microbiology:  Recent Results (from the past 720 hour(s))  C difficile quick scan w PCR reflex     Status: None   Collection Time: 09/09/16 12:02 PM  Result Value Ref Range Status   C Diff antigen NEGATIVE NEGATIVE Final   C  Diff toxin NEGATIVE NEGATIVE Final   C Diff interpretation No C. difficile detected.  Final    Coagulation Studies: No results for input(s): LABPROT, INR in the last 72 hours.  Urinalysis:  Recent Labs  09/09/16 1202  COLORURINE YELLOW*  LABSPEC 1.008  PHURINE 7.0  GLUCOSEU >=500*  HGBUR NEGATIVE  BILIRUBINUR NEGATIVE  KETONESUR NEGATIVE  PROTEINUR 100*  NITRITE NEGATIVE  LEUKOCYTESUR NEGATIVE      Imaging: No results found.   Medications:   . sodium chloride 1 mL/kg/hr (09/10/16 1050)  . tirofiban     . aspirin  81 mg Oral Daily  . calcitRIOL  0.25 mcg Oral QODAY  . [START ON 09/11/2016] clopidogrel  75 mg Oral Q breakfast  . gabapentin  300 mg Oral TID  . heparin  5,000 Units Subcutaneous Q8H  . hydrALAZINE  50 mg Oral BID  . insulin aspart  0-9 Units Subcutaneous TID WC  . insulin aspart  10 Units Subcutaneous TID AC  . insulin glargine  20 Units Subcutaneous QHS  . lisinopril  40 mg Oral Daily  . metoprolol succinate  25 mg Oral QPM  . rosuvastatin  20 mg Oral QHS  . sodium chloride flush  3 mL Intravenous Q12H  . sodium chloride flush  3 mL Intravenous Q12H   sodium chloride, acetaminophen, docusate sodium, hydrALAZINE, labetalol, ondansetron (ZOFRAN) IV, ondansetron (ZOFRAN) IV, sodium chloride flush  Assessment/ Plan:  66 y.o.African-American  male with long-standing diabetes, hypertension, coronary disease, CABG in 2006, ESRD    1. ESRD, [place] hemodialysis, MWF, [facility] nephrology Pt completed HD today, tolerated well, next HD on Wednesday.   2. Shortness of breath with positive troponins/NSTEMI - s/p cardiac catherization with stent placement.  Further management per   3. DM-2 with CKD - Management per hospitalist.   3. SHPTH - pt not currently on binders, check phos intermittently while here.    LOS: 0 Naesha Buckalew 4/2/20183:26 PM

## 2016-09-10 NOTE — Progress Notes (Signed)
Pre hd info 

## 2016-09-10 NOTE — Progress Notes (Signed)
Start of hd 

## 2016-09-10 NOTE — Progress Notes (Signed)
Patients head elevated at 1110.  Cath site shows no signs of bleeding.  Surrounding skin remains soft.  PAD in place with 35 ml of air.

## 2016-09-10 NOTE — Care Management (Signed)
Terry Tyler with [employer] notified CM patient is known chronic dialysis patient.

## 2016-09-10 NOTE — Progress Notes (Signed)
Post hd vitals 

## 2016-09-10 NOTE — Care Management Obs Status (Addendum)
[facility] NOTIFICATION   Patient Details  Name: Terry Tyler MRN: [id number] Date of Birth: 11/24/1950   Medicare Observation Status Notification Given:  Yes Noticed that there is no documentation of an observation notice being given on the day patient was placed in observation.  Patient has not been available for this CM to give.  Left notice in patient's room  with CM contact information to explain   Was actually able to get this notice signed 4.3.2018 9:30A  Katrina Stack, RN 09/10/2016, 12:43 PM

## 2016-09-10 NOTE — Progress Notes (Signed)
Multiple attempts to get 2nd IV site unsuccessful.

## 2016-09-10 NOTE — Plan of Care (Signed)
Problem: Pain Managment: Goal: General experience of comfort will improve Outcome: Progressing No discomfort at cath site. Site is dry with PAD in place.

## 2016-09-10 NOTE — Progress Notes (Signed)
Report given to specials (cath lab).

## 2016-09-11 ENCOUNTER — Encounter: Payer: Self-pay | Admitting: Cardiology

## 2016-09-11 LAB — BASIC METABOLIC PANEL
Anion gap: 9 (ref 5–15)
BUN: 30 mg/dL — AB (ref 6–20)
CALCIUM: 8.8 mg/dL — AB (ref 8.9–10.3)
CO2: 26 mmol/L (ref 22–32)
CREATININE: 6.11 mg/dL — AB (ref 0.61–1.24)
Chloride: 100 mmol/L — ABNORMAL LOW (ref 101–111)
GFR calc Af Amer: 10 mL/min — ABNORMAL LOW (ref >60)
GFR, EST NON AFRICAN AMERICAN: 9 mL/min — AB (ref >60)
GLUCOSE: 173 mg/dL — AB (ref 65–99)
Potassium: 3.9 mmol/L (ref 3.5–5.1)
Sodium: 135 mmol/L (ref 135–145)

## 2016-09-11 LAB — GLUCOSE, CAPILLARY: GLUCOSE-CAPILLARY: 152 mg/dL — AB (ref 65–99)

## 2016-09-11 LAB — CBC
HCT: 28.2 % — ABNORMAL LOW (ref 40.0–52.0)
Hemoglobin: 9.6 g/dL — ABNORMAL LOW (ref 13.0–18.0)
MCH: 28.6 pg (ref 26.0–34.0)
MCHC: 34 g/dL (ref 32.0–36.0)
MCV: 84 fL (ref 80.0–100.0)
PLATELETS: 206 10*3/uL (ref 150–440)
RBC: 3.36 MIL/uL — ABNORMAL LOW (ref 4.40–5.90)
RDW: 15.6 % — AB (ref 11.5–14.5)
WBC: 8.3 10*3/uL (ref 3.8–10.6)

## 2016-09-11 LAB — PHOSPHORUS: PHOSPHORUS: 4.2 mg/dL (ref 2.5–4.6)

## 2016-09-11 NOTE — Progress Notes (Signed)
[facility] Cardiology  SUBJECTIVE: I don't have chest pain   Vitals:   09/10/16 1454 09/10/16 1601 09/10/16 2006 09/11/16 0420  BP: (!) 151/80 (!) 155/79 (!) 158/71 139/61  Pulse: 65 70 76 87  Resp: 12 14 18 18   Temp:   98.8 F (37.1 C) (!) 100.4 F (38 C)  TempSrc:   Oral Oral  SpO2: 100% 99% 96% 95%  Weight:      Height:         Intake/Output Summary (Last 24 hours) at 09/11/16 0804 Last data filed at 09/11/16 6546  Gross per 24 hour  Intake          1259.69 ml  Output              204 ml  Net          1055.69 ml      PHYSICAL EXAM  General: Well developed, well nourished, in no acute distress HEENT:  Normocephalic and atramatic Neck:  No JVD.  Lungs: Clear bilaterally to auscultation and percussion. Heart: HRRR . Normal S1 and S2 without gallops or murmurs.  Abdomen: Bowel sounds are positive, abdomen soft and non-tender  Msk:  Back normal, normal gait. Normal strength and tone for age. Extremities: No clubbing, cyanosis or edema.   Neuro: Alert and oriented X 3. Psych:  Good affect, responds appropriately   LABS: Basic Metabolic Panel:  Recent Labs  09/08/16 1301 09/09/16 0027 09/10/16 2236 09/11/16 0524  NA 134* 132*  --  135  K 3.4* 3.1*  --  3.9  CL 93* 92*  --  100*  CO2 27 29  --  26  GLUCOSE 199* 217*  --  173*  BUN 29* 37*  --  30*  CREATININE 6.17* 7.11*  --  6.11*  CALCIUM 9.2 8.5*  --  8.8*  MG 1.7  --   --   --   PHOS  --   --  4.2  --    Liver Function Tests:  Recent Labs  09/08/16 1301  AST 21  ALT 12*  ALKPHOS 94  BILITOT 0.9  PROT 8.6*  ALBUMIN 3.6   No results for input(s): LIPASE, AMYLASE in the last 72 hours. CBC:  Recent Labs  09/09/16 0027 09/11/16 0524  WBC 8.9 8.3  HGB 9.8* 9.6*  HCT 28.9* 28.2*  MCV 83.3 84.0  PLT 184 206   Cardiac Enzymes:  Recent Labs  09/08/16 1628 09/08/16 2029 09/09/16 0027  TROPONINI 1.41* 1.22* 0.98*   BNP: Invalid input(s): POCBNP D-Dimer: No results for input(s): DDIMER in  the last 72 hours. Hemoglobin A1C:  Recent Labs  09/08/16 1628  HGBA1C 7.9*   Fasting Lipid Panel:  Recent Labs  09/08/16 1628  CHOL 202*  HDL 29*  LDLCALC 127*  TRIG 228*  CHOLHDL 7.0   Thyroid Function Tests: No results for input(s): TSH, T4TOTAL, T3FREE, THYROIDAB in the last 72 hours.  Invalid input(s): FREET3 Anemia Panel: No results for input(s): VITAMINB12, FOLATE, FERRITIN, TIBC, IRON, RETICCTPCT in the last 72 hours.  No results found.   Echo LV EF 40-45%  TELEMETRY: Normal sinus rhythm:  ASSESSMENT AND PLAN:  Principal Problem:   Elevated troponin    1. NSTEMI, status post DES ostium of the SVG to PDA 2. Mild-to-moderate cardiomyopathy 3. ESRD  Recommendations  1. Continue current therapy 2. Continue dual antiplatelet therapy uninterrupted for 1 year 3. May discharge home, follow-up with Dr. Nehemiah Massed as outpatient   Terry Cowman, MD,  PhD, [facility] 09/11/2016 8:04 AM

## 2016-09-11 NOTE — Discharge Summary (Signed)
Terry Tyler at [place] NAME: Terry Tyler    MR#:  [id number]  DATE OF BIRTH:  1951/04/06  DATE OF ADMISSION:  09/08/2016   ADMITTING PHYSICIAN: Vaughan Basta, MD  DATE OF DISCHARGE: 09/11/2016 11:25 AM  PRIMARY CARE PHYSICIAN: Marguerita Merles, MD   ADMISSION DIAGNOSIS:  NSTEMI (non-ST elevated myocardial infarction) ([place]) [I21.4] Vomiting and diarrhea [R11.10, R19.7] Generalized weakness [R53.1] DISCHARGE DIAGNOSIS:  Principal Problem:   Elevated troponin NSTEMI, status post DES ostium of the SVG to PDA, Severe three-vessel coronary artery disease SECONDARY DIAGNOSIS:   Past Medical History:  Diagnosis Date  . CKD (chronic kidney disease)   . Coronary atherosclerosis of unspecified type of vessel, native or graft    Myocardial infarction in 2006. Cardiac catheterization showed significant three-vessel coronary artery disease. He underwent CABG at [facility]. Most recent cardiac catheterization in 2010 showed normal LV systolic function, patent grafts to OM1, RCA and LAD. Occluded SVG to diagonal.  . 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 Hyperlipidemia   . Hypertension   . Hypertension, renal disease   . Impotence of organic origin   . Keloid scar   . Neoplasm of uncertain behavior, site unspecified   . Peripheral vascular disease, unspecified   . Proteinuria   . Unspecified vitamin D deficiency    HOSPITAL COURSE:   * NSTEMI, status post DES ostium of the SVG to PDA, Severe three-vessel coronary artery disease,known history of coronary artery disease status post CABG, with ischemic cardiomyopathy.   Cardiac catheterization showed three-vessel disease, PCI, with DES ostial stenosis SVG to PDA per Dr. Saralyn Pilar. Continue aspirin, Plavix, Lopressor, lisinopril and Crestor.  * Viral gastroenteritis Patient denies use of any oral antibiotics in last 1-2 months, his diarrhea seems to be stopped now  as last episode was Thursday which was 2 days ago.  improved.  * Generalized weakness Likely secondary to his gastroenteritis. No PT follow-up per PT evaluation.  * End-stage renal disease on hemodialysis Gothemodialysis while patient is in the hospital.  * Diabetes on sliding scale coverage and lantus.  Hypokalemia. Give potassium supplement. Improved.  DISCHARGE CONDITIONS:  Stable, discharge to home today. CONSULTS OBTAINED:  Treatment Team:  Isaias Cowman, MD Murlean Iba, MD DRUG ALLERGIES:   Allergies  Allergen Reactions  . Lipitor [Atorvastatin] Other (See Comments)    Muscle cramping   DISCHARGE MEDICATIONS:   Allergies as of 09/11/2016      Reactions   Lipitor [atorvastatin] Other (See Comments)   Muscle cramping      Medication List    STOP taking these medications   sildenafil 50 MG tablet Commonly known as:  VIAGRA     TAKE these medications   aspirin 81 MG tablet Take 81 mg by mouth daily.   calcitRIOL 0.25 MCG capsule Commonly known as:  ROCALTROL Take 0.25 mcg by mouth every other day.   clopidogrel 75 MG tablet Commonly known as:  PLAVIX Take 1 tablet (75 mg total) by mouth daily.   furosemide 40 MG tablet Commonly known as:  LASIX Take 40 mg by mouth 2 (two) times daily.   gabapentin 300 MG capsule Commonly known as:  NEURONTIN Take 300 mg by mouth 3 (three) times daily.   hydrALAZINE 50 MG tablet Commonly known as:  APRESOLINE Take 50 mg by mouth 2 (two) times daily.   insulin aspart 100 UNIT/ML injection Commonly known as:  novoLOG Inject 10-15 Units into the  skin 3 (three) times daily before meals. Per sliding scale   insulin glargine 100 UNIT/ML injection Commonly known as:  LANTUS Inject 0.25 mLs (25 Units total) into the skin at bedtime. Takes 80 units at bedtimed;Titrate 4 units every 4 days unitl FBG are in the 130s. What changed:  additional instructions   lisinopril 40 MG tablet Commonly known as:   PRINIVIL,ZESTRIL Take 40 mg by mouth daily.   metoprolol succinate 25 MG 24 hr tablet Commonly known as:  TOPROL-XL Take 25 mg by mouth daily.   PRODIGY MINI PEN NEEDLES 31G X 5 MM Misc Generic drug:  Insulin Pen Needle by Does not apply route as directed.   rosuvastatin 20 MG tablet Commonly known as:  CRESTOR Take 20 mg by mouth at bedtime.   Vitamin D3 50000 units Caps Take 1 capsule by mouth once a week.        DISCHARGE INSTRUCTIONS:  See AVS.  If you experience worsening of your admission symptoms, develop shortness of breath, life threatening emergency, suicidal or homicidal thoughts you must seek medical attention immediately by calling 911 or calling your MD immediately  if symptoms less severe.  You Must read complete instructions/literature along with all the possible adverse reactions/side effects for all the Medicines you take and that have been prescribed to you. Take any new Medicines after you have completely understood and accpet all the possible adverse reactions/side effects.   Please note  You were cared for by a hospitalist during your hospital stay. If you have any questions about your discharge medications or the care you received while you were in the hospital after you are discharged, you can call the unit and asked to speak with the hospitalist on call if the hospitalist that took care of you is not available. Once you are discharged, your primary care physician will handle any further medical issues. Please note that NO REFILLS for any discharge medications will be authorized once you are discharged, as it is imperative that you return to your primary care physician (or establish a relationship with a primary care physician if you do not have one) for your aftercare needs so that they can reassess your need for medications and monitor your lab values.    On the day of Discharge:  VITAL SIGNS:  Blood pressure 139/61, pulse 87, temperature 99.6 F (37.6  C), resp. rate 18, height 6' (1.829 m), weight 241 lb 10 oz (109.6 kg), SpO2 95 %. PHYSICAL EXAMINATION:  GENERAL:  66 y.o.-year-old patient lying in the bed with no acute distress.  EYES: Pupils equal, round, reactive to light and accommodation. No scleral icterus. Extraocular muscles intact.  HEENT: Head atraumatic, normocephalic. Oropharynx and nasopharynx clear.  NECK:  Supple, no jugular venous distention. No thyroid enlargement, no tenderness.  LUNGS: Normal breath sounds bilaterally, no wheezing, rales,rhonchi or crepitation. No use of accessory muscles of respiration.  CARDIOVASCULAR: S1, S2 normal. No murmurs, rubs, or gallops.  ABDOMEN: Soft, non-tender, non-distended. Bowel sounds present. No organomegaly or mass.  EXTREMITIES: No pedal edema, cyanosis, or clubbing.  NEUROLOGIC: Cranial nerves II through XII are intact. Muscle strength 5/5 in all extremities. Sensation intact. Gait not checked.  PSYCHIATRIC: The patient is alert and oriented x 3.  SKIN: No obvious rash, lesion, or ulcer.  DATA REVIEW:   CBC  Recent Labs Lab 09/11/16 0524  WBC 8.3  HGB 9.6*  HCT 28.2*  PLT 206    Chemistries   Recent Labs Lab 09/08/16 1301  09/11/16 0524  NA 134*  < > 135  K 3.4*  < > 3.9  CL 93*  < > 100*  CO2 27  < > 26  GLUCOSE 199*  < > 173*  BUN 29*  < > 30*  CREATININE 6.17*  < > 6.11*  CALCIUM 9.2  < > 8.8*  MG 1.7  --   --   AST 21  --   --   ALT 12*  --   --   ALKPHOS 94  --   --   BILITOT 0.9  --   --   < > = values in this interval not displayed.   Microbiology Results  Results for orders placed or performed during the hospital encounter of 09/08/16  C difficile quick scan w PCR reflex     Status: None   Collection Time: 09/09/16 12:02 PM  Result Value Ref Range Status   C Diff antigen NEGATIVE NEGATIVE Final   C Diff toxin NEGATIVE NEGATIVE Final   C Diff interpretation No C. difficile detected.  Final    RADIOLOGY:  No results found.   Management  plans discussed with the patient, family and they are in agreement.  CODE STATUS: Full Code   TOTAL TIME TAKING CARE OF THIS PATIENT: 32 minutes.    Demetrios Loll M.D on 09/11/2016 at 11:25 AM  Between 7am to 6pm - Pager - [phone]  After 6pm go to [email or web address] - Proofreader  [facility]  Office  [phone]  CC: Primary care physician; Marguerita Merles, MD   Note: This dictation was prepared with Dragon dictation along with smaller phrase technology. Any transcriptional errors that result from this process are unintentional.

## 2016-09-11 NOTE — Progress Notes (Signed)
Removed PAD, groin site without hematoma.

## 2016-09-11 NOTE — Plan of Care (Signed)
Problem: Cardiovascular: Goal: Vascular access site(s) Level 0-1 will be maintained Outcome: Progressing No voiced complaints of pain this shift.  Right groin site without bleed or hematoma.

## 2016-09-11 NOTE — Progress Notes (Signed)
Patient ambulated to nursing station, no SOB, no chest pain.  No hematoma at groin site.  VSS.  No new medications.  Vascular discharge instructions given.  Wife at bedside.  To be escorted out of hospital via wheelchair by volunteers.

## 2016-09-11 NOTE — Discharge Instructions (Signed)
Heart healthy and ADA, renal diet.

## 2016-09-18 DIAGNOSIS — I2119 ST elevation (STEMI) myocardial infarction involving other coronary artery of inferior wall: Secondary | ICD-10-CM | POA: Insufficient documentation

## 2016-10-02 DIAGNOSIS — R112 Nausea with vomiting, unspecified: Secondary | ICD-10-CM | POA: Insufficient documentation

## 2016-10-02 DIAGNOSIS — D72829 Elevated white blood cell count, unspecified: Secondary | ICD-10-CM | POA: Insufficient documentation

## 2016-10-02 DIAGNOSIS — R5383 Other fatigue: Secondary | ICD-10-CM | POA: Insufficient documentation

## 2016-10-16 ENCOUNTER — Other Ambulatory Visit (INDEPENDENT_AMBULATORY_CARE_PROVIDER_SITE_OTHER): Payer: Medicare HMO

## 2016-10-16 ENCOUNTER — Encounter (INDEPENDENT_AMBULATORY_CARE_PROVIDER_SITE_OTHER): Payer: Self-pay

## 2016-10-16 ENCOUNTER — Encounter (INDEPENDENT_AMBULATORY_CARE_PROVIDER_SITE_OTHER): Payer: Self-pay | Admitting: Vascular Surgery

## 2016-10-16 ENCOUNTER — Ambulatory Visit (INDEPENDENT_AMBULATORY_CARE_PROVIDER_SITE_OTHER): Payer: Medicare HMO | Admitting: Vascular Surgery

## 2016-10-16 ENCOUNTER — Other Ambulatory Visit (INDEPENDENT_AMBULATORY_CARE_PROVIDER_SITE_OTHER): Payer: Self-pay | Admitting: Nephrology

## 2016-10-16 VITALS — BP 136/77 | HR 80 | Resp 16 | Ht 72.0 in | Wt 242.0 lb

## 2016-10-16 DIAGNOSIS — E785 Hyperlipidemia, unspecified: Secondary | ICD-10-CM | POA: Diagnosis not present

## 2016-10-16 DIAGNOSIS — I10 Essential (primary) hypertension: Secondary | ICD-10-CM | POA: Diagnosis not present

## 2016-10-16 DIAGNOSIS — N189 Chronic kidney disease, unspecified: Secondary | ICD-10-CM

## 2016-10-16 DIAGNOSIS — N186 End stage renal disease: Secondary | ICD-10-CM

## 2016-10-16 DIAGNOSIS — Z992 Dependence on renal dialysis: Secondary | ICD-10-CM

## 2016-10-16 NOTE — Patient Instructions (Signed)
AV Fistula Placement, Care After Refer to this sheet in the next few weeks. These instructions provide you with information about caring for yourself after your procedure. Your health care provider may also give you more specific instructions. Your treatment has been planned according to current medical practices, but problems sometimes occur. Call your health care provider if you have any problems or questions after your procedure. What can I expect after the procedure? After your procedure, it is common to:  Feel sore.  Have numbness.  Feel cold.  Feel a vibration (thrill) over the fistula. Follow these instructions at home:   Incision care   Do not take baths or showers, swim, or use a hot tub until your health care provider approves.  Keep the area around your cut from surgery (incision) clean and dry.  Follow instructions from your health care provider about how to take care of your incision. Make sure you:  Wash your hands with soap and water before you change your bandage (dressing). If soap and water are not available, use hand sanitizer.  Change your dressing as told by your health care provider.  Leave stitches (sutures) and clips in place. They may need to stay in place for 2 weeks or longer. Fistula Care   Check your fistula site every day to make sure the thrill feels the same.  Check your fistula site every day for signs of infection. Watch for:  Redness.  Swelling.  Discharge.  Tenderness.  Enlargement.  Keep your arm raised (elevated) while you rest.  Do not lift anything that is heavier than a gallon of milk with the arm that has the fistula.  Do not lie down on your fistula arm.  Do not let anyone draw blood or take a blood pressure reading on your fistula arm.  Do not wear tight jewelry or clothing over your fistula arm. General instructions   Rest at home for a day or two.  Gradually start doing your usual activities again. Ask your surgeon  when you can return to work or school.  Take over-the-counter and prescription medicines only as told by your health care provider.  Keep all follow-up visits as told by your health care provider. This is important. Contact a health care provider if:  You have chills or a fever.  You have pain at your fistula site that is not going away.  You have numbness or coldness at your fistula site that is not going away.  You feel a decrease or a change in the thrill.  You have swelling in your arm or hand.  You have redness, swelling, discharge, tenderness, or enlargement at your fistula site. Get help right away if:  You are bleeding from your fistula site.  You have chest pain.  You have trouble breathing. This information is not intended to replace advice given to you by your health care provider. Make sure you discuss any questions you have with your health care provider. Document Released: 05/28/2005 Document Revised: 10/05/2015 Document Reviewed: 08/18/2014 Elsevier Interactive Patient Education  2017 Elsevier Inc.  

## 2016-10-16 NOTE — Assessment & Plan Note (Signed)
blood pressure control important in reducing the progression of atherosclerotic disease. On appropriate oral medications.  

## 2016-10-16 NOTE — Assessment & Plan Note (Signed)
lipid control important in reducing the progression of atherosclerotic disease. Continue statin therapy  

## 2016-10-16 NOTE — Assessment & Plan Note (Signed)
He has vein mapping today which appears to show adequate cephalic vein throughout both upper extremities and both the forearm and the antecubital fossa. His arterial waveforms are triphasic bilaterally down to the radial and ulnar arteries at the wrist. We had a long discussion today about dialysis access options. Given these findings, I would recommend a left radiocephalic AV fistula creation. I have discussed the risks and benefits the procedure. I discussed other options including graft placement, continued catheter use, and fistulas in other locations. Agreeable to proceed with left radiocephalic AV fistula creation and this will be done at his convenience in the near future.

## 2016-10-16 NOTE — Progress Notes (Signed)
Patient ID: Terry Tyler, male   DOB: [birth year], 66 y.o.   MRN: [id number]  Chief Complaint  Patient presents with  . New Evaluation    Vein Mapping    HPI Terry Tyler is a 66 y.o. male.  I am asked to see the patient by Dr. Rockwell Germany for evaluation of access placement for ESRD.  The patient reports Getting dialysis on Mondays, Wednesdays, and Fridays through his right jugular PermCath. Although this is working reasonably well, he understands that he needs to switch over to a permanent access option in his arm. He is right-hand dominant. He reports no previous fistula or graft placements in either arm. He has vein mapping today which appears to show adequate cephalic vein throughout both upper extremities and both the forearm and the antecubital fossa. His arterial waveforms are triphasic bilaterally down to the radial and ulnar arteries at the wrist.  Past Medical History:  Diagnosis Date  . CKD (chronic kidney disease)   . Coronary atherosclerosis of unspecified type of vessel, native or graft    Myocardial infarction in 2006. Cardiac catheterization showed significant three-vessel coronary artery disease. He underwent CABG at [facility]. Most recent cardiac catheterization in 2010 showed normal LV systolic function, patent grafts to OM1, RCA and LAD. Occluded SVG to diagonal.  . 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 Hyperlipidemia   . Hypertension   . Hypertension, renal disease   . Impotence of organic origin   . Keloid scar   . Neoplasm of uncertain behavior, site unspecified   . Peripheral vascular disease, unspecified ([place])   . Proteinuria   . Unspecified vitamin D deficiency     Past Surgical History:  Procedure Laterality Date  . CARDIAC CATHETERIZATION  12/2004   [facility];[place]  . CARDIAC CATHETERIZATION  09/2008   [facility];[place]  . CORONARY ARTERY BYPASS GRAFT  2006   [facility]  . CORONARY STENT INTERVENTION N/A 09/10/2016   Procedure: Coronary Stent  Intervention;  Surgeon: Isaias Cowman, MD;  Location: [place] [facility];  Service: Cardiovascular;  Laterality: N/A;  . keloid removal     right neck  . LEFT HEART CATH AND CORONARY ANGIOGRAPHY N/A 09/10/2016   Procedure: Left Heart Cath and Coronary Angiography;  Surgeon: Isaias Cowman, MD;  Location: [place] [facility];  Service: Cardiovascular;  Laterality: N/A;    Family History  Problem Relation Age of Onset  . Hypertension Mother   . Hyperlipidemia Mother   . Heart disease Mother   . Heart disease Father   . Hyperlipidemia Father   . Hypertension Father     Social History Social History  Substance Use Topics  . Smoking status: Former Smoker    Packs/day: 0.50    Years: 0.00    Types: Cigarettes  . Smokeless tobacco: Never Used  . Alcohol use No     Comment: socially    Allergies  Allergen Reactions  . Ezetimibe     Other reaction(s): Other (See Comments)  . Lipitor [Atorvastatin] Other (See Comments)    Muscle cramping    Current Outpatient Prescriptions  Medication Sig Dispense Refill  . aspirin 81 MG tablet Take 81 mg by mouth daily.    . calcitRIOL (ROCALTROL) 0.25 MCG capsule Take 0.25 mcg by mouth every other day.    . cinacalcet (SENSIPAR) 30 MG tablet Take by mouth.    . clopidogrel (PLAVIX) 75 MG tablet Take 1 tablet (75 mg total) by mouth  daily. 30 tablet 1  . furosemide (LASIX) 40 MG tablet Take 40 mg by mouth 2 (two) times daily.    Marland Kitchen gabapentin (NEURONTIN) 300 MG capsule Take 300 mg by mouth 3 (three) times daily.    . hydrALAZINE (APRESOLINE) 50 MG tablet Take 50 mg by mouth 2 (two) times daily.    . insulin aspart (NOVOLOG) 100 UNIT/ML injection Inject 10-15 Units into the skin 3 (three) times daily before meals. Per sliding scale     . insulin glargine (LANTUS) 100 UNIT/ML injection Inject 0.25 mLs (25 Units total) into the skin at bedtime. Takes 80 units at bedtimed;Titrate 4 units every 4 days unitl FBG are in the 130s. (Patient  taking differently: Inject 25 Units into the skin at bedtime. ) 10 mL 11  . Insulin Pen Needle (PRODIGY MINI PEN NEEDLES) 31G X 5 MM MISC by Does not apply route as directed.    . metoprolol succinate (TOPROL-XL) 25 MG 24 hr tablet Take 25 mg by mouth daily.    . rosuvastatin (CRESTOR) 20 MG tablet Take 20 mg by mouth at bedtime.      No current facility-administered medications for this visit.       REVIEW OF SYSTEMS (Negative unless checked)  Constitutional: _0 Weight loss  _1 Fever  _2 Chills Cardiac: _3 Chest pain   _4 Chest pressure   _5 Palpitations   _6 Shortness of breath when laying flat   _7 Shortness of breath at rest   _8 Shortness of breath with exertion. Vascular:  _9 Pain in legs with walking   _10 Pain in legs at rest   _11 Pain in legs when laying flat   _12 Claudication   _13 Pain in feet when walking  _14 Pain in feet at rest  _15 Pain in feet when laying flat   _16 History of DVT   _17 Phlebitis   _18 Swelling in legs   _19 Varicose veins   _20 Non-healing ulcers Pulmonary:   _21 Uses home oxygen   _22 Productive cough   _23 Hemoptysis   _24 Wheeze  _25 COPD   _26 Asthma Neurologic:  _27 Dizziness  _28 Blackouts   _29 Seizures   _30 History of stroke   _31 History of TIA  _32 Aphasia   _33 Temporary blindness   _34 Dysphagia   _35 Weakness or numbness in arms   _36 Weakness or numbness in legs Musculoskeletal:  _37 Arthritis   _38 Joint swelling   _39 Joint pain   _40 Low back pain Hematologic:  _41 Easy bruising  _42 Easy bleeding   _43 Hypercoagulable state   _44 Anemic  _45 Hepatitis Gastrointestinal:  _46 Blood in stool   _47 Vomiting blood  _48 Gastroesophageal reflux/heartburn   _49 Abdominal pain Genitourinary:  _50 Chronic kidney disease   _51 Difficult urination  _52 Frequent urination  _53 Burning with urination   _54 Hematuria Skin:  _55 Rashes   _56 Ulcers   _57 Wounds Psychological:  _58 History of anxiety   _59  History of major depression.    Physical Exam BP 136/77 (BP Location: Right Arm)   Pulse 80   Resp 16   Ht 6' (1.829 m)   Wt 242 lb (109.8 kg)    BMI 32.82 kg/m  Gen:  WD/WN, NAD Head: [place]/AT, No temporalis wasting. Prominent temp pulse not noted. Ear/Nose/Throat: Hearing grossly intact, nares w/o erythema or drainage, oropharynx w/o Erythema/Exudate Eyes: Conjunctiva clear, sclera non-icteric  Neck: trachea midline.  No JVD.  Pulmonary:  Good air movement, no use of accessory muscles, respirations not labored  Cardiac: Irregular Vascular: PermCath present in right subclavicular location Vessel Right Left  Radial Palpable Palpable  Gastrointestinal: soft, non-tender/non-distended. Musculoskeletal: M/S 5/5 throughout.  Extremities without ischemic changes.  No deformity or atrophy. Allen's test normal bilaterally Neurologic: Sensation grossly intact in extremities.  Symmetrical.  Speech is fluent. Motor exam as listed above. Psychiatric: Judgment intact, Mood & affect appropriate for pt's clinical situation. Dermatologic: No rashes or ulcers noted.  No cellulitis or open wounds.    Radiology No results found.  Labs Recent Results (from the past 2160 hour(s))  Basic metabolic panel     Status: Abnormal   Collection Time: 09/08/16  1:01 PM  Result Value Ref Range   Sodium 134 (L) 135 - 145 mmol/L   Potassium 3.4 (L) 3.5 - 5.1 mmol/L   Chloride 93 (L) 101 - 111 mmol/L   CO2 27 22 - 32 mmol/L   Glucose, Bld 199 (H) 65 - 99 mg/dL   BUN 29 (H) 6 - 20 mg/dL   Creatinine, Ser 6.17 (H) 0.61 - 1.24 mg/dL   Calcium 9.2 8.9 - 10.3 mg/dL   GFR calc non Af Amer 9 (L) >60 mL/min   GFR calc Af Amer 10 (L) >60 mL/min    Comment: (NOTE) The eGFR has been calculated using the CKD EPI equation. This calculation has not been validated in all clinical situations. eGFR's persistently <60 mL/min signify possible Chronic Kidney Disease.    Anion gap 14 5 - 15  CBC     Status: Abnormal   Collection Time: 09/08/16  1:01 PM  Result Value Ref Range   WBC 11.3 (H) 3.8 - 10.6 K/uL   RBC 3.90 (L) 4.40 -  5.90 MIL/uL   Hemoglobin 11.1 (L) 13.0 - 18.0 g/dL   HCT 32.6 (L) 40.0 - 52.0 %   MCV 83.5 80.0 - 100.0 fL   MCH 28.3 26.0 - 34.0 pg   MCHC 33.9 32.0 - 36.0 g/dL   RDW 15.7 (H) 11.5 - 14.5 %   Platelets 189 150 - 440 K/uL  Troponin I     Status: Abnormal   Collection Time: 09/08/16  1:01 PM  Result Value Ref Range   Troponin I 1.51 (HH) <0.03 ng/mL    Comment: CRITICAL RESULT CALLED TO, READ BACK BY AND VERIFIED WITH BRANDY DAVIS ON 09/08/16 AT 1424 QSD   Hepatic function panel     Status: Abnormal   Collection Time: 09/08/16  1:01 PM  Result Value Ref Range   Total Protein 8.6 (H) 6.5 - 8.1 g/dL   Albumin 3.6 3.5 - 5.0 g/dL   AST 21 15 - 41 U/L   ALT 12 (L) 17 - 63 U/L   Alkaline Phosphatase 94 38 - 126 U/L   Total Bilirubin 0.9 0.3 - 1.2 mg/dL   Bilirubin, Direct 0.2 0.1 - 0.5 mg/dL   Indirect Bilirubin 0.7 0.3 - 0.9 mg/dL  Magnesium     Status: None   Collection Time: 09/08/16  1:01 PM  Result Value Ref Range   Magnesium 1.7 1.7 - 2.4 mg/dL  Influenza panel by PCR (type A & B)     Status: None   Collection Time: 09/08/16  1:38 PM  Result Value Ref Range   Influenza A By PCR NEGATIVE NEGATIVE   Influenza B By PCR NEGATIVE NEGATIVE    Comment: (NOTE) The Xpert Xpress Flu assay is intended as an aid in the diagnosis of  influenza and should not be used as a sole basis for treatment.  This  assay is FDA approved for nasopharyngeal swab specimens only. Nasal  washings and aspirates are unacceptable for Xpert Xpress Flu testing.   Lipid panel     Status: Abnormal   Collection Time: 09/08/16  4:28 PM  Result Value Ref Range   Cholesterol 202 (H) 0 - 200 mg/dL   Triglycerides 228 (H) <150 mg/dL   HDL 29 (L) >40 mg/dL   Total CHOL/HDL Ratio 7.0 RATIO   VLDL 46 (H) 0 - 40 mg/dL   LDL Cholesterol 127 (H) 0 - 99 mg/dL    Comment:        Total Cholesterol/HDL:CHD Risk Coronary Heart Disease Risk Table                     Men   Women  1/2 Average Risk   3.4   3.3  Average  Risk       5.0   4.4  2 X Average Risk   9.6   7.1  3 X Average Risk  23.4   11.0        Use the calculated Patient Ratio above and the CHD Risk Table to determine the patient's CHD Risk.        ATP III CLASSIFICATION (LDL):  <100     mg/dL   Optimal  100-129  mg/dL   Near or Above                    Optimal  130-159  mg/dL   Borderline  160-189  mg/dL   High  >190     mg/dL   Very High   Hemoglobin A1c     Status: Abnormal   Collection Time: 09/08/16  4:28 PM  Result Value Ref Range   Hgb A1c MFr Bld 7.9 (H) 4.8 - 5.6 %    Comment: (NOTE)         Pre-diabetes: 5.7 - 6.4         Diabetes: >6.4         Glycemic control for adults with diabetes: <7.0    Mean Plasma Glucose 180 mg/dL    Comment: (NOTE) Performed At: [facility] [place], [place] [id number] Lindon Romp MD NO:[id number]   Troponin I     Status: Abnormal   Collection Time: 09/08/16  4:28 PM  Result Value Ref Range   Troponin I 1.41 (HH) <0.03 ng/mL    Comment: CRITICAL VALUE NOTED. VALUE IS CONSISTENT WITH PREVIOUSLY REPORTED/CALLED VALUE TLB   Glucose, capillary     Status: Abnormal   Collection Time: 09/08/16  5:32 PM  Result Value Ref Range   Glucose-Capillary 208 (H) 65 - 99 mg/dL  Troponin I     Status: Abnormal   Collection Time: 09/08/16  8:29 PM  Result Value Ref Range   Troponin I 1.22 (HH) <0.03 ng/mL    Comment: CRITICAL VALUE NOTED. VALUE IS CONSISTENT WITH PREVIOUSLY REPORTED/CALLED VALUE.PMH  HIV antibody (Routine Testing)     Status: None   Collection Time: 09/08/16  8:29 PM  Result Value Ref Range   HIV Screen 4th Generation wRfx Non Reactive Non Reactive    Comment: (NOTE) Performed At: [facility] [place], [place] [id number] Lindon Romp MD UT:[id number]   Glucose, capillary     Status: Abnormal   Collection Time: 09/08/16 10:49 PM  Result Value Ref Range   Glucose-Capillary 212 (H) 65 - 99 mg/dL   Comment 1 Notify RN     Comment 2 Document in Chart  Troponin I     Status: Abnormal   Collection Time: 09/09/16 12:27 AM  Result Value Ref Range   Troponin I 0.98 (HH) <0.03 ng/mL    Comment: CRITICAL VALUE NOTED. VALUE IS CONSISTENT WITH PREVIOUSLY REPORTED/CALLED VALUE.PMH  Basic metabolic panel     Status: Abnormal   Collection Time: 09/09/16 12:27 AM  Result Value Ref Range   Sodium 132 (L) 135 - 145 mmol/L   Potassium 3.1 (L) 3.5 - 5.1 mmol/L   Chloride 92 (L) 101 - 111 mmol/L   CO2 29 22 - 32 mmol/L   Glucose, Bld 217 (H) 65 - 99 mg/dL   BUN 37 (H) 6 - 20 mg/dL   Creatinine, Ser 7.11 (H) 0.61 - 1.24 mg/dL   Calcium 8.5 (L) 8.9 - 10.3 mg/dL   GFR calc non Af Amer 7 (L) >60 mL/min   GFR calc Af Amer 8 (L) >60 mL/min    Comment: (NOTE) The eGFR has been calculated using the CKD EPI equation. This calculation has not been validated in all clinical situations. eGFR's persistently <60 mL/min signify possible Chronic Kidney Disease.    Anion gap 11 5 - 15  CBC     Status: Abnormal   Collection Time: 09/09/16 12:27 AM  Result Value Ref Range   WBC 8.9 3.8 - 10.6 K/uL   RBC 3.47 (L) 4.40 - 5.90 MIL/uL   Hemoglobin 9.8 (L) 13.0 - 18.0 g/dL   HCT 28.9 (L) 40.0 - 52.0 %   MCV 83.3 80.0 - 100.0 fL   MCH 28.4 26.0 - 34.0 pg   MCHC 34.0 32.0 - 36.0 g/dL   RDW 15.9 (H) 11.5 - 14.5 %   Platelets 184 150 - 440 K/uL  Glucose, capillary     Status: Abnormal   Collection Time: 09/09/16  7:52 AM  Result Value Ref Range   Glucose-Capillary 215 (H) 65 - 99 mg/dL  Glucose, capillary     Status: Abnormal   Collection Time: 09/09/16 12:01 PM  Result Value Ref Range   Glucose-Capillary 126 (H) 65 - 99 mg/dL  Urinalysis, Complete w Microscopic     Status: Abnormal   Collection Time: 09/09/16 12:02 PM  Result Value Ref Range   Color, Urine YELLOW (A) YELLOW   APPearance CLEAR (A) CLEAR   Specific Gravity, Urine 1.008 1.005 - 1.030   pH 7.0 5.0 - 8.0   Glucose, UA >=500 (A) NEGATIVE mg/dL   Hgb urine dipstick  NEGATIVE NEGATIVE   Bilirubin Urine NEGATIVE NEGATIVE   Ketones, ur NEGATIVE NEGATIVE mg/dL   Protein, ur 100 (A) NEGATIVE mg/dL   Nitrite NEGATIVE NEGATIVE   Leukocytes, UA NEGATIVE NEGATIVE   RBC / HPF 0-5 0 - 5 RBC/hpf   WBC, UA 0-5 0 - 5 WBC/hpf   Bacteria, UA NONE SEEN NONE SEEN   Squamous Epithelial / LPF 0-5 (A) NONE SEEN  C difficile quick scan w PCR reflex     Status: None   Collection Time: 09/09/16 12:02 PM  Result Value Ref Range   C Diff antigen NEGATIVE NEGATIVE   C Diff toxin NEGATIVE NEGATIVE   C Diff interpretation No C. difficile detected.   Glucose, capillary     Status: Abnormal   Collection Time: 09/09/16  5:09 PM  Result Value Ref Range   Glucose-Capillary 210 (H) 65 - 99 mg/dL  Glucose, capillary     Status: Abnormal   Collection Time: 09/09/16  7:35 PM  Result Value Ref Range  Glucose-Capillary 214 (H) 65 - 99 mg/dL   Comment 1 Notify RN   Glucose, capillary     Status: Abnormal   Collection Time: 09/09/16 10:10 PM  Result Value Ref Range   Glucose-Capillary 188 (H) 65 - 99 mg/dL   Comment 1 Notify RN   POCT Activated clotting time     Status: None   Collection Time: 09/10/16  8:35 AM  Result Value Ref Range   Activated Clotting Time 290 seconds  Glucose, capillary     Status: Abnormal   Collection Time: 09/10/16 10:53 AM  Result Value Ref Range   Glucose-Capillary 154 (H) 65 - 99 mg/dL  Glucose, capillary     Status: Abnormal   Collection Time: 09/10/16  5:08 PM  Result Value Ref Range   Glucose-Capillary 140 (H) 65 - 99 mg/dL  Glucose, capillary     Status: Abnormal   Collection Time: 09/10/16  8:28 PM  Result Value Ref Range   Glucose-Capillary 118 (H) 65 - 99 mg/dL  Phosphorus     Status: None   Collection Time: 09/10/16 10:36 PM  Result Value Ref Range   Phosphorus 4.2 2.5 - 4.6 mg/dL  Basic metabolic panel     Status: Abnormal   Collection Time: 09/11/16  5:24 AM  Result Value Ref Range   Sodium 135 135 - 145 mmol/L   Potassium 3.9  3.5 - 5.1 mmol/L   Chloride 100 (L) 101 - 111 mmol/L   CO2 26 22 - 32 mmol/L   Glucose, Bld 173 (H) 65 - 99 mg/dL   BUN 30 (H) 6 - 20 mg/dL   Creatinine, Ser 6.11 (H) 0.61 - 1.24 mg/dL   Calcium 8.8 (L) 8.9 - 10.3 mg/dL   GFR calc non Af Amer 9 (L) >60 mL/min   GFR calc Af Amer 10 (L) >60 mL/min    Comment: (NOTE) The eGFR has been calculated using the CKD EPI equation. This calculation has not been validated in all clinical situations. eGFR's persistently <60 mL/min signify possible Chronic Kidney Disease.    Anion gap 9 5 - 15  CBC     Status: Abnormal   Collection Time: 09/11/16  5:24 AM  Result Value Ref Range   WBC 8.3 3.8 - 10.6 K/uL   RBC 3.36 (L) 4.40 - 5.90 MIL/uL   Hemoglobin 9.6 (L) 13.0 - 18.0 g/dL   HCT 28.2 (L) 40.0 - 52.0 %   MCV 84.0 80.0 - 100.0 fL   MCH 28.6 26.0 - 34.0 pg   MCHC 34.0 32.0 - 36.0 g/dL   RDW 15.6 (H) 11.5 - 14.5 %   Platelets 206 150 - 440 K/uL  Glucose, capillary     Status: Abnormal   Collection Time: 09/11/16  7:47 AM  Result Value Ref Range   Glucose-Capillary 152 (H) 65 - 99 mg/dL    Assessment/Plan:  Hypertension blood pressure control important in reducing the progression of atherosclerotic disease. On appropriate oral medications.   Hyperlipidemia lipid control important in reducing the progression of atherosclerotic disease. Continue statin therapy   ESRD on dialysis [facility]) He has vein mapping today which appears to show adequate cephalic vein throughout both upper extremities and both the forearm and the antecubital fossa. His arterial waveforms are triphasic bilaterally down to the radial and ulnar arteries at the wrist. We had a long discussion today about dialysis access options. Given these findings, I would recommend a left radiocephalic AV fistula creation. I have discussed the risks and  benefits the procedure. I discussed other options including graft placement, continued catheter use, and fistulas in other locations.  Agreeable to proceed with left radiocephalic AV fistula creation and this will be done at his convenience in the near future.      [name] 10/16/2016, 10:36 AM   This note was created with Dragon medical transcription system.  Any errors from dictation are unintentional.

## 2016-10-18 ENCOUNTER — Other Ambulatory Visit (INDEPENDENT_AMBULATORY_CARE_PROVIDER_SITE_OTHER): Payer: Self-pay | Admitting: Vascular Surgery

## 2016-10-18 ENCOUNTER — Encounter
Admission: RE | Admit: 2016-10-18 | Discharge: 2016-10-18 | Disposition: A | Discharge status: Home / Self Care | Payer: Medicare HMO | Source: Ambulatory Visit | Attending: Vascular Surgery | Admitting: Vascular Surgery

## 2016-10-18 DIAGNOSIS — E785 Hyperlipidemia, unspecified: Secondary | ICD-10-CM | POA: Diagnosis not present

## 2016-10-18 DIAGNOSIS — Z87891 Personal history of nicotine dependence: Secondary | ICD-10-CM | POA: Insufficient documentation

## 2016-10-18 DIAGNOSIS — Z794 Long term (current) use of insulin: Secondary | ICD-10-CM | POA: Diagnosis not present

## 2016-10-18 DIAGNOSIS — E1122 Type 2 diabetes mellitus with diabetic chronic kidney disease: Secondary | ICD-10-CM | POA: Diagnosis not present

## 2016-10-18 DIAGNOSIS — I129 Hypertensive chronic kidney disease with stage 1 through stage 4 chronic kidney disease, or unspecified chronic kidney disease: Secondary | ICD-10-CM | POA: Insufficient documentation

## 2016-10-18 DIAGNOSIS — Z9889 Other specified postprocedural states: Secondary | ICD-10-CM | POA: Diagnosis not present

## 2016-10-18 DIAGNOSIS — Z8249 Family history of ischemic heart disease and other diseases of the circulatory system: Secondary | ICD-10-CM | POA: Insufficient documentation

## 2016-10-18 DIAGNOSIS — Z7982 Long term (current) use of aspirin: Secondary | ICD-10-CM | POA: Diagnosis not present

## 2016-10-18 DIAGNOSIS — Z888 Allergy status to other drugs, medicaments and biological substances status: Secondary | ICD-10-CM | POA: Diagnosis not present

## 2016-10-18 DIAGNOSIS — Z01812 Encounter for preprocedural laboratory examination: Secondary | ICD-10-CM | POA: Diagnosis not present

## 2016-10-18 DIAGNOSIS — N186 End stage renal disease: Secondary | ICD-10-CM | POA: Diagnosis not present

## 2016-10-18 DIAGNOSIS — Z79899 Other long term (current) drug therapy: Secondary | ICD-10-CM | POA: Insufficient documentation

## 2016-10-18 HISTORY — DX: Cerebral infarction, unspecified: I63.9

## 2016-10-18 HISTORY — DX: Acute myocardial infarction, unspecified: I21.9

## 2016-10-18 HISTORY — DX: Dependence on renal dialysis: Z99.2

## 2016-10-18 LAB — CBC WITH DIFFERENTIAL/PLATELET
BASOS ABS: 0.1 10*3/uL (ref 0–0.1)
BASOS PCT: 1 %
EOS ABS: 0.1 10*3/uL (ref 0–0.7)
Eosinophils Relative: 1 %
HCT: 30.2 % — ABNORMAL LOW (ref 40.0–52.0)
Hemoglobin: 9.9 g/dL — ABNORMAL LOW (ref 13.0–18.0)
Lymphocytes Relative: 5 %
Lymphs Abs: 0.6 10*3/uL — ABNORMAL LOW (ref 1.0–3.6)
MCH: 27.3 pg (ref 26.0–34.0)
MCHC: 32.7 g/dL (ref 32.0–36.0)
MCV: 83.6 fL (ref 80.0–100.0)
MONOS PCT: 5 %
Monocytes Absolute: 0.7 10*3/uL (ref 0.2–1.0)
NEUTROS PCT: 88 %
Neutro Abs: 11 10*3/uL — ABNORMAL HIGH (ref 1.4–6.5)
Platelets: 258 10*3/uL (ref 150–440)
RBC: 3.61 MIL/uL — ABNORMAL LOW (ref 4.40–5.90)
RDW: 16.6 % — ABNORMAL HIGH (ref 11.5–14.5)
WBC: 12.5 10*3/uL — AB (ref 3.8–10.6)

## 2016-10-18 LAB — SURGICAL PCR SCREEN
MRSA, PCR: NEGATIVE
Staphylococcus aureus: NEGATIVE

## 2016-10-18 LAB — PROTIME-INR
INR: 1.1
PROTHROMBIN TIME: 14.2 s (ref 11.4–15.2)

## 2016-10-18 LAB — BASIC METABOLIC PANEL
ANION GAP: 13 (ref 5–15)
BUN: 26 mg/dL — ABNORMAL HIGH (ref 6–20)
CALCIUM: 9 mg/dL (ref 8.9–10.3)
CO2: 30 mmol/L (ref 22–32)
CREATININE: 5.46 mg/dL — AB (ref 0.61–1.24)
Chloride: 91 mmol/L — ABNORMAL LOW (ref 101–111)
GFR calc Af Amer: 12 mL/min — ABNORMAL LOW (ref >60)
GFR calc non Af Amer: 10 mL/min — ABNORMAL LOW (ref >60)
Glucose, Bld: 161 mg/dL — ABNORMAL HIGH (ref 65–99)
Potassium: 3.5 mmol/L (ref 3.5–5.1)
SODIUM: 134 mmol/L — AB (ref 135–145)

## 2016-10-18 LAB — TYPE AND SCREEN
ABO/RH(D): A POS
Antibody Screen: NEGATIVE

## 2016-10-18 LAB — APTT: APTT: 35 s (ref 24–36)

## 2016-10-18 NOTE — Pre-Procedure Instructions (Signed)
AS INSTRUCTED BY DR J ADAMS, FAXED TO DR [place] AND TO DETERMINE IF OR WHEN CAN STOP ANTICOAGULANTS FOR SURGERY. SPOKE WITH BETSY AND EBONY AT DR Lucky Cowboy. ALSO SPOKE WITH LAURA

## 2016-10-18 NOTE — Patient Instructions (Addendum)
Your procedure is scheduled on: Oct 25, 2016 (Thursday) Report to Same Day Surgery 2nd floor medical mall [facility] Entrance-take elevator on left to 2nd floor.  Check in with surgery information desk.) To find out your arrival time please call [phone] between 1PM - 3PM on Oct 24, 2016 (Wednesday)    Remember: Instructions that are not followed completely may result in serious medical risk, up to and including death, or upon the discretion of your surgeon and anesthesiologist your surgery may need to be rescheduled.    _x___ 1. Do not eat food or drink liquids after midnight. No gum chewing or  hard candies                                __x__ 2. No Alcohol for 24 hours before or after surgery.   __x__3. No Smoking for 24 prior to surgery.   ____  4. Bring all medications with you on the day of surgery if instructed.    __x__ 5. Notify your doctor if there is any change in your medical condition     (cold, fever, infections).     Do not wear jewelry, make-up, hairpins, clips or nail polish.  Do not wear lotions, powders, or perfumes. You may wear deodorant.  Do not shave 48 hours prior to surgery. Men may shave face and neck.  Do not bring valuables to the hospital.    [facility] is not responsible for any belongings or valuables.               Contacts, dentures or bridgework may not be worn into surgery.  Leave your suitcase in the car. After surgery it may be brought to your room.  For patients admitted to the hospital, discharge time is determined by your treatment team                         Patients discharged the day of surgery will not be allowed to drive home.  You will need someone to drive you home and stay with you the night of your procedure.    Please read over the following fact sheets that you were given:   [facility] Preparing for Surgery and or MRSA Information   _x___ Take anti-hypertensive (unless it includes a diuretic), cardiac, seizure, asthma,      anti-reflux and psychiatric medicines. These include:  1. GABAPENTIN   2. HydrALAZINE   ____Fleets enema or Magnesium Citrate as directed.   _x___ Use CHG Soap or sage wipes as directed on instruction sheet   ____ Use inhalers on the day of surgery and bring to hospital day of surgery  ____ Stop Metformin and Janumet 2 days prior to surgery.    _x___ Take 1/2 of usual insulin dose the night before surgery and none on the morning surgery (TAKE ONE -HALF OF LANTUS  INSULIN ON WEDNESDAY  NIGHT AND NO INSULIN THE MORNING OF SURGERY )    _x___ Follow recommendations from Cardiologist, Pulmonologist or PCP regarding          stopping Aspirin, Coumadin, Pllavix ,Eliquis, Effient, or Pradaxa, and Pletal. ( DR  DEW OFFICE TO CALL YOU ABOUT CONTINUING OR STOPPING PLAVIX )  X____Stop Anti-inflammatories such as Advil, Aleve, Ibuprofen, Motrin, Naproxen, Naprosyn, Goodies powders or aspirin products. OK to take Tylenol     _x___ Stop supplements until after surgery.  But may  continue Vitamin D, Vitamin B,  and multivitamin       ____ Bring C-Pap to the hospital.

## 2016-10-24 MED ORDER — CEFAZOLIN SODIUM-DEXTROSE 2-4 GM/100ML-% IV SOLN
2.0000 g | INTRAVENOUS | Status: AC
  Administered 2016-10-25: 2 g via INTRAVENOUS

## 2016-10-24 NOTE — Pre-Procedure Instructions (Signed)
This RN spoke with Mickel Baas at Dr. Bunnie Domino office regarding cardiac clearance for pt for surgery tomorrow.  [facility] clinic cardiology has been called 3 times today requesting an update on the clearance for this pt, each time the response has been Dr. Nehemiah Massed is in with a pt and when he comes out he will be informed that the cardiac clearance is needed for surgery tomorrow and it will be faxed to PAT and SDS.  At 1655 the clearance has yet to be received.

## 2016-10-25 ENCOUNTER — Ambulatory Visit: Payer: Medicare HMO | Admitting: Anesthesiology

## 2016-10-25 ENCOUNTER — Encounter
Admission: RE | Disposition: A | Discharge status: Home / Self Care | Payer: Self-pay | Source: Ambulatory Visit | Attending: Vascular Surgery

## 2016-10-25 ENCOUNTER — Ambulatory Visit
Admission: RE | Admit: 2016-10-25 | Discharge: 2016-10-25 | Disposition: A | Discharge status: Home / Self Care | Payer: Medicare HMO | Source: Ambulatory Visit | Attending: Vascular Surgery | Admitting: Vascular Surgery

## 2016-10-25 ENCOUNTER — Encounter: Payer: Self-pay | Admitting: *Deleted

## 2016-10-25 DIAGNOSIS — Z8673 Personal history of transient ischemic attack (TIA), and cerebral infarction without residual deficits: Secondary | ICD-10-CM | POA: Diagnosis not present

## 2016-10-25 DIAGNOSIS — E1122 Type 2 diabetes mellitus with diabetic chronic kidney disease: Secondary | ICD-10-CM | POA: Diagnosis not present

## 2016-10-25 DIAGNOSIS — N186 End stage renal disease: Secondary | ICD-10-CM | POA: Insufficient documentation

## 2016-10-25 DIAGNOSIS — I2510 Atherosclerotic heart disease of native coronary artery without angina pectoris: Secondary | ICD-10-CM | POA: Diagnosis not present

## 2016-10-25 DIAGNOSIS — Z992 Dependence on renal dialysis: Secondary | ICD-10-CM | POA: Diagnosis not present

## 2016-10-25 DIAGNOSIS — E785 Hyperlipidemia, unspecified: Secondary | ICD-10-CM | POA: Insufficient documentation

## 2016-10-25 DIAGNOSIS — I252 Old myocardial infarction: Secondary | ICD-10-CM | POA: Insufficient documentation

## 2016-10-25 DIAGNOSIS — Z87891 Personal history of nicotine dependence: Secondary | ICD-10-CM | POA: Insufficient documentation

## 2016-10-25 DIAGNOSIS — E1151 Type 2 diabetes mellitus with diabetic peripheral angiopathy without gangrene: Secondary | ICD-10-CM | POA: Insufficient documentation

## 2016-10-25 DIAGNOSIS — Z951 Presence of aortocoronary bypass graft: Secondary | ICD-10-CM | POA: Insufficient documentation

## 2016-10-25 DIAGNOSIS — I120 Hypertensive chronic kidney disease with stage 5 chronic kidney disease or end stage renal disease: Secondary | ICD-10-CM | POA: Diagnosis not present

## 2016-10-25 DIAGNOSIS — Z794 Long term (current) use of insulin: Secondary | ICD-10-CM | POA: Diagnosis not present

## 2016-10-25 HISTORY — PX: AV FISTULA PLACEMENT: SHX1204

## 2016-10-25 LAB — GLUCOSE, CAPILLARY
Glucose-Capillary: 109 mg/dL — ABNORMAL HIGH (ref 65–99)
Glucose-Capillary: 96 mg/dL (ref 65–99)

## 2016-10-25 LAB — POCT I-STAT 4, (NA,K, GLUC, HGB,HCT)
GLUCOSE: 115 mg/dL — AB (ref 65–99)
HEMATOCRIT: 31 % — AB (ref 39.0–52.0)
HEMOGLOBIN: 10.5 g/dL — AB (ref 13.0–17.0)
Potassium: 3 mmol/L — ABNORMAL LOW (ref 3.5–5.1)
Sodium: 139 mmol/L (ref 135–145)

## 2016-10-25 LAB — ABO/RH: ABO/RH(D): A POS

## 2016-10-25 SURGERY — ARTERIOVENOUS (AV) FISTULA CREATION
Anesthesia: General | Laterality: Left

## 2016-10-25 MED ORDER — FENTANYL CITRATE (PF) 100 MCG/2ML IJ SOLN
INTRAMUSCULAR | Status: AC
  Filled 2016-10-25: qty 2

## 2016-10-25 MED ORDER — MIDAZOLAM HCL 2 MG/2ML IJ SOLN
1.0000 mg | Freq: Once | INTRAMUSCULAR | Status: AC
Start: 2016-10-25 — End: 2016-10-25
  Administered 2016-10-25: 1 mg via INTRAVENOUS

## 2016-10-25 MED ORDER — LIDOCAINE HCL (PF) 1 % IJ SOLN
INTRAMUSCULAR | Status: DC | PRN
  Administered 2016-10-25: 3 mL

## 2016-10-25 MED ORDER — EVICEL 2 ML EX KIT
PACK | CUTANEOUS | Status: DC | PRN
  Administered 2016-10-25: 2 mL

## 2016-10-25 MED ORDER — PROPOFOL 500 MG/50ML IV EMUL
INTRAVENOUS | Status: AC
  Filled 2016-10-25: qty 50

## 2016-10-25 MED ORDER — MIDAZOLAM HCL 2 MG/2ML IJ SOLN
INTRAMUSCULAR | Status: AC
  Administered 2016-10-25: 1 mg via INTRAVENOUS
  Filled 2016-10-25: qty 2

## 2016-10-25 MED ORDER — METOPROLOL SUCCINATE ER 25 MG PO TB24
25.0000 mg | ORAL_TABLET | Freq: Once | ORAL | Status: DC
  Filled 2016-10-25: qty 1

## 2016-10-25 MED ORDER — LIDOCAINE HCL (PF) 1 % IJ SOLN
INTRAMUSCULAR | Status: AC
  Filled 2016-10-25: qty 5

## 2016-10-25 MED ORDER — CHLORHEXIDINE GLUCONATE CLOTH 2 % EX PADS: 6.0000 | MEDICATED_PAD | Freq: Once | CUTANEOUS | Status: DC

## 2016-10-25 MED ORDER — HYDROCODONE-ACETAMINOPHEN 5-325 MG PO TABS: 1.0000 | ORAL_TABLET | Freq: Four times a day (QID) | ORAL | 0 refills | Status: DC | PRN

## 2016-10-25 MED ORDER — PROPOFOL 10 MG/ML IV BOLUS
INTRAVENOUS | Status: DC | PRN
  Administered 2016-10-25: 30 mg via INTRAVENOUS

## 2016-10-25 MED ORDER — ONDANSETRON HCL 4 MG/2ML IJ SOLN
INTRAMUSCULAR | Status: DC | PRN
  Administered 2016-10-25: 4 mg via INTRAVENOUS

## 2016-10-25 MED ORDER — LIDOCAINE HCL (PF) 2 % IJ SOLN
INTRAMUSCULAR | Status: AC
  Filled 2016-10-25: qty 2

## 2016-10-25 MED ORDER — PAPAVERINE HCL 30 MG/ML IJ SOLN
INTRAMUSCULAR | Status: AC
  Filled 2016-10-25: qty 2

## 2016-10-25 MED ORDER — HEPARIN SODIUM (PORCINE) 1000 UNIT/ML IJ SOLN
INTRAMUSCULAR | Status: AC
  Filled 2016-10-25: qty 1

## 2016-10-25 MED ORDER — FAMOTIDINE 20 MG PO TABS
20.0000 mg | ORAL_TABLET | Freq: Once | ORAL | Status: AC
  Administered 2016-10-25: 20 mg via ORAL

## 2016-10-25 MED ORDER — PROPOFOL 500 MG/50ML IV EMUL
INTRAVENOUS | Status: DC | PRN
  Administered 2016-10-25: 75 ug/kg/min via INTRAVENOUS

## 2016-10-25 MED ORDER — FENTANYL CITRATE (PF) 100 MCG/2ML IJ SOLN: 25.0000 ug | INTRAMUSCULAR | Status: DC | PRN

## 2016-10-25 MED ORDER — SODIUM CHLORIDE 0.9 % IV SOLN
INTRAVENOUS | Status: DC
  Administered 2016-10-25: 12:00:00 via INTRAVENOUS
  Administered 2016-10-25: 25 mL/h via INTRAVENOUS

## 2016-10-25 MED ORDER — HEPARIN SODIUM (PORCINE) 1000 UNIT/ML IJ SOLN
INTRAMUSCULAR | Status: DC | PRN
  Administered 2016-10-25: 3000 [IU] via INTRAVENOUS

## 2016-10-25 MED ORDER — MEPERIDINE HCL 50 MG/ML IJ SOLN: 6.2500 mg | INTRAMUSCULAR | Status: DC | PRN

## 2016-10-25 MED ORDER — EVICEL 2 ML EX KIT
PACK | CUTANEOUS | Status: AC
  Filled 2016-10-25: qty 1

## 2016-10-25 MED ORDER — ROPIVACAINE HCL 5 MG/ML IJ SOLN
INTRAMUSCULAR | Status: DC | PRN
  Administered 2016-10-25: 30 mL via PERINEURAL

## 2016-10-25 MED ORDER — ROPIVACAINE HCL 5 MG/ML IJ SOLN
INTRAMUSCULAR | Status: AC
  Filled 2016-10-25: qty 30

## 2016-10-25 MED ORDER — CEFAZOLIN SODIUM-DEXTROSE 2-4 GM/100ML-% IV SOLN
INTRAVENOUS | Status: AC
  Filled 2016-10-25: qty 100

## 2016-10-25 MED ORDER — BUPIVACAINE-EPINEPHRINE (PF) 0.5% -1:200000 IJ SOLN
INTRAMUSCULAR | Status: AC
  Filled 2016-10-25: qty 30

## 2016-10-25 MED ORDER — PROMETHAZINE HCL 25 MG/ML IJ SOLN: 6.2500 mg | INTRAMUSCULAR | Status: DC | PRN

## 2016-10-25 MED ORDER — FAMOTIDINE 20 MG PO TABS
ORAL_TABLET | ORAL | Status: AC
  Administered 2016-10-25: 20 mg via ORAL
  Filled 2016-10-25: qty 1

## 2016-10-25 MED ORDER — LIDOCAINE HCL (CARDIAC) 20 MG/ML IV SOLN
INTRAVENOUS | Status: DC | PRN
  Administered 2016-10-25: 40 mg via INTRAVENOUS

## 2016-10-25 MED ORDER — MIDAZOLAM HCL 2 MG/2ML IJ SOLN
INTRAMUSCULAR | Status: AC
  Filled 2016-10-25: qty 2

## 2016-10-25 MED ORDER — HEPARIN SODIUM (PORCINE) 5000 UNIT/ML IJ SOLN
INTRAMUSCULAR | Status: AC
  Filled 2016-10-25: qty 1

## 2016-10-25 SURGICAL SUPPLY — 50 items
BAG DECANTER FOR FLEXI CONT (MISCELLANEOUS) ×3 IMPLANT
BLADE SURG SZ11 CARB STEEL (BLADE) ×3 IMPLANT
BOOT SUTURE AID YELLOW STND (SUTURE) ×3 IMPLANT
BRUSH SCRUB 4% CHG (MISCELLANEOUS) ×3 IMPLANT
CANISTER SUCT 1200ML W/VALVE (MISCELLANEOUS) ×3 IMPLANT
CHLORAPREP W/TINT 26ML (MISCELLANEOUS) ×3 IMPLANT
CLIP SPRNG 6MM S-JAW DBL (CLIP) ×3
DERMABOND ADVANCED (GAUZE/BANDAGES/DRESSINGS) ×2
DERMABOND ADVANCED .7 DNX12 (GAUZE/BANDAGES/DRESSINGS) ×1 IMPLANT
ELECT CAUTERY BLADE 6.4 (BLADE) ×3 IMPLANT
ELECT REM PT RETURN 9FT ADLT (ELECTROSURGICAL) ×3
ELECTRODE REM PT RTRN 9FT ADLT (ELECTROSURGICAL) ×1 IMPLANT
GEL ULTRASOUND 20GR AQUASONIC (MISCELLANEOUS) IMPLANT
GLOVE BIO SURGEON STRL SZ7 (GLOVE) ×6 IMPLANT
GLOVE INDICATOR 7.5 STRL GRN (GLOVE) ×3 IMPLANT
GOWN STRL REUS W/ TWL LRG LVL3 (GOWN DISPOSABLE) ×2 IMPLANT
GOWN STRL REUS W/ TWL XL LVL3 (GOWN DISPOSABLE) ×1 IMPLANT
GOWN STRL REUS W/TWL LRG LVL3 (GOWN DISPOSABLE) ×6
GOWN STRL REUS W/TWL XL LVL3 (GOWN DISPOSABLE) ×3
HEMOSTAT SURGICEL 2X3 (HEMOSTASIS) IMPLANT
IV NS 500ML (IV SOLUTION) ×3
IV NS 500ML BAXH (IV SOLUTION) ×1 IMPLANT
KIT RM TURNOVER STRD PROC AR (KITS) ×3 IMPLANT
LABEL OR SOLS (LABEL) ×3 IMPLANT
LOOP RED MAXI  1X406MM (MISCELLANEOUS) ×2
LOOP VESSEL MAXI 1X406 RED (MISCELLANEOUS) ×1 IMPLANT
LOOP VESSEL MINI 0.8X406 BLUE (MISCELLANEOUS) ×1 IMPLANT
LOOPS BLUE MINI 0.8X406MM (MISCELLANEOUS) ×2
NEEDLE FILTER BLUNT 18X 1/2SAF (NEEDLE) ×2
NEEDLE FILTER BLUNT 18X1 1/2 (NEEDLE) ×1 IMPLANT
NEEDLE HYPO 30X.5 LL (NEEDLE) IMPLANT
NS IRRIG 500ML POUR BTL (IV SOLUTION) ×3 IMPLANT
PACK EXTREMITY ARMC (MISCELLANEOUS) ×3 IMPLANT
PAD PREP 24X41 OB/GYN DISP (PERSONAL CARE ITEMS) ×3 IMPLANT
SOLUTION CELL SAVER (CLIP) ×1 IMPLANT
STOCKINETTE STRL 4IN 9604848 (GAUZE/BANDAGES/DRESSINGS) ×3 IMPLANT
SUT MNCRL AB 4-0 PS2 18 (SUTURE) IMPLANT
SUT PROLENE 6 0 BV (SUTURE) ×12 IMPLANT
SUT SILK 2 0 (SUTURE) ×3
SUT SILK 2-0 18XBRD TIE 12 (SUTURE) ×1 IMPLANT
SUT SILK 3 0 (SUTURE) ×2
SUT SILK 3-0 18XBRD TIE 12 (SUTURE) ×1 IMPLANT
SUT SILK 4 0 (SUTURE)
SUT SILK 4-0 18XBRD TIE 12 (SUTURE) IMPLANT
SUT VIC AB 3-0 SH 27 (SUTURE) ×4
SUT VIC AB 3-0 SH 27X BRD (SUTURE) ×2 IMPLANT
SYR 20CC LL (SYRINGE) ×3 IMPLANT
SYR 3ML LL SCALE MARK (SYRINGE) ×3 IMPLANT
SYR TB 1ML 27GX1/2 LL (SYRINGE) IMPLANT
TOWEL OR 17X26 4PK STRL BLUE (TOWEL DISPOSABLE) IMPLANT

## 2016-10-25 NOTE — Anesthesia Preprocedure Evaluation (Signed)
Anesthesia Evaluation  Patient identified by MRN, date of birth, ID band Patient awake    Reviewed: Allergy & Precautions, NPO status , Patient's Chart, lab work & pertinent test results  History of Anesthesia Complications Negative for: history of anesthetic complications  Airway Mallampati: II  TM Distance: >3 FB Neck ROM: Full    Dental  (+) Missing, Poor Dentition   Pulmonary neg sleep apnea, neg COPD, former smoker,    breath sounds clear to auscultation- rhonchi (-) wheezing      Cardiovascular hypertension, + CAD, + Past MI, + Cardiac Stents (last stent 1 mo ago, cleared by cardiology to proceed with AV fistula surgery) and + Peripheral Vascular Disease   Rhythm:Regular Rate:Normal - Systolic murmurs and - Diastolic murmurs    Neuro/Psych CVA, No Residual Symptoms negative psych ROS   GI/Hepatic negative GI ROS, Neg liver ROS,   Endo/Other  diabetes, Insulin Dependent  Renal/GU ESRF and DialysisRenal disease (last dialysis 10/24/16)     Musculoskeletal negative musculoskeletal ROS (+)   Abdominal (+) + obese,   Peds  Hematology negative hematology ROS (+)   Anesthesia Other Findings Past Medical History: No date: CKD (chronic kidney disease) No date: Coronary atherosclerosis of unspecified type o*     Comment: Myocardial infarction in 2006. Cardiac               catheterization showed significant three-vessel              coronary artery disease. He underwent CABG at               [facility]. Most recent cardiac catheterization              in 2010 showed normal LV systolic function,               patent grafts to OM1, RCA and LAD. Occluded SVG              to diagonal. No date: Diabetes mellitus without complication (HCC)     Comment: type I No date: Dialysis patient [facility])     Comment: Mon. Wed. Fri. for dialysis No date: Hyperlipidemia No date: Hypertension No date: Hypertension, renal disease No  date: Impotence of organic origin No date: Keloid scar 2006: Myocardial infarction ([place]) No date: Neoplasm of uncertain behavior, site unspecifi* No date: Peripheral vascular disease, unspecified ([place]) No date: Proteinuria 04/2016: Stroke ([place])     Comment: TIA No date: Unspecified vitamin D deficiency   Reproductive/Obstetrics                             Anesthesia Physical Anesthesia Plan  ASA: IV  Anesthesia Plan: General   Post-op Pain Management:  Regional for Post-op pain   Induction: Intravenous  Airway Management Planned: Natural Airway  Additional Equipment:   Intra-op Plan:   Post-operative Plan:   Informed Consent: I have reviewed the patients History and Physical, chart, labs and discussed the procedure including the risks, benefits and alternatives for the proposed anesthesia with the patient or authorized representative who has indicated his/her understanding and acceptance.   Dental advisory given  Plan Discussed with: CRNA and Anesthesiologist  Anesthesia Plan Comments:         Anesthesia Quick Evaluation

## 2016-10-25 NOTE — Discharge Instructions (Signed)

## 2016-10-25 NOTE — Op Note (Signed)
[facility]   OPERATIVE NOTE   PROCEDURE: Left radiocephaic arteriovenous fistula placement  PRE-OPERATIVE DIAGNOSIS: 1. ESRD  POST-OPERATIVE DIAGNOSIS: Same  SURGEON: [name], MD  ASSISTANT(S): Hezzie Bump, PA-C  ANESTHESIA: general  ESTIMATED BLOOD LOSS: 15 cc  FINDING(S): Adequate cephalic vein for AVF creation  SPECIMEN(S):  None  INDICATIONS:   Terry Tyler is a 66 y.o. male who presents with ESRD.  The patient is scheduled for left radiocephalic arteriovenous fistula placement when non-invasive studies suggested adequate anatomy for fistula creation at this location.  The patient is aware the risks include but are not limited to: bleeding, infection, steal syndrome, nerve damage, ischemic monomelic neuropathy, failure to mature, and need for additional procedures.  The patient is aware of the risks of the procedure and elects to proceed forward.  DESCRIPTION: After full informed written consent was obtained from the patient, the patient was brought back to the operating room and placed supine upon the operating table.  Prior to induction, the patient received IV antibiotics.   After obtaining adequate anesthesia, the patient was then prepped and draped in the standard fashion for a left arm access procedure.  I turned my attention first to identifying the patient's distal cephalic vein and radial artery.  I made an incision at the level of the distal forearm and wrist and dissected through the subcutaneous tissue and fascia to gain exposure of the radial artery.  This was noted to be adequate in size and useable for fistula creation.  This was dissected out proximally and distally and controlled with vessel loops.  I then dissected out the cephalic vein.  This was noted to be patent and adequate size for fistula creation. I then gave the patient 3000 units of intravenous heparin.  The distal segment of the vein was ligated with a  2-0 silk, and the  vein was transected. I then instilled the heparinized saline into the vein and clamped it.  At this point, I reset my exposure of the radial artery and placed the artery under tension proximally and distally.  I made an arteriotomy with a #11 blade, and then I extended the arteriotomy with a Potts scissor.  I injected heparinized saline proximal and distal to this arteriotomy.  The vein was then sewn to the artery in an end-to-side configuration with a running stitch of 6-0 Prolene.  Prior to completing this anastomosis, I allowed the vein and artery to backbleed.  There was no evidence of clot from any vessels.  I completed the anastomosis in the usual fashion and then released all vessel loops and clamps.  There was a palpable  thrill in the venous outflow, and there was a palpable radial pulse beyond the anastomosis.  At this point, I irrigated out the surgical wound. Surgicel was placed. There was no further active bleeding.  The subcutaneous tissue was reapproximated with a running stitch of 3-0 Vicryl.  The skin was then reapproximated with a running subcuticular stitch of 4-0 Monocryl.  The skin was then cleaned, dried, and reinforced with Dermabond.  The patient tolerated this procedure well and was taken to the recovery room in stable condition  COMPLICATIONS: None  CONDITION: Stable   [name], MD 10/25/2016 1:47 PM   This note was created with Dragon Medical transcription system. Any errors in dictation are purely unintentional.

## 2016-10-25 NOTE — Anesthesia Post-op Follow-up Note (Cosign Needed)
Anesthesia QCDR form completed.        

## 2016-10-25 NOTE — Transfer of Care (Signed)
Immediate Anesthesia Transfer of Care Note  Patient: Terry Tyler  Procedure(s) Performed: Procedure(s): ARTERIOVENOUS (AV) FISTULA CREATION ( RADIOCEPHALIC ) (Left)  Patient Location: PACU  Anesthesia Type:General  Level of Consciousness: awake, alert , oriented and patient cooperative  Airway & Oxygen Therapy: Patient Spontanous Breathing and Patient connected to nasal cannula oxygen  Post-op Assessment: Report given to RN and Post -op Vital signs reviewed and stable  Post vital signs: Reviewed and stable  Last Vitals:  Vitals:   10/25/16 1205 10/25/16 1220  BP: (!) 178/82   Pulse: 70 75  Resp: 18 17  Temp:      Last Pain:  Vitals:   10/25/16 1205  TempSrc:   PainSc: 0-No pain      Patients Stated Pain Goal: 0 (53/79/43 2761)  Complications: No apparent anesthesia complications

## 2016-10-25 NOTE — H&P (Signed)
[facility] History & Physical Update  The patient was interviewed and re-examined.  The patient's previous History and Physical has been reviewed and is unchanged.  There is no change in the plan of care. We plan to proceed with the scheduled procedure.  [name], MD  10/25/2016, 12:21 PM

## 2016-10-25 NOTE — OR Nursing (Signed)
Patient reports having diarrhea for past week.  Patient has had to bm since arriving to post-op.  Dr Lucky Cowboy notified and he recommended imodium.  Patient reports telling them at dialysis.  Also received an order to apply sling tO ARM.  Able to palpate thrill.  Left hand warm, color pink and cap refill brisk.  Instructed wife on importance and to monitor closely.

## 2016-10-25 NOTE — Anesthesia Postprocedure Evaluation (Signed)
Anesthesia Post Note  Patient: Terry Tyler  Procedure(s) Performed: Procedure(s) (LRB): ARTERIOVENOUS (AV) FISTULA CREATION ( RADIOCEPHALIC ) (Left)  Patient location during evaluation: PACU Anesthesia Type: General Level of consciousness: awake and alert Pain management: pain level controlled Vital Signs Assessment: post-procedure vital signs reviewed and stable Respiratory status: spontaneous breathing and respiratory function stable Cardiovascular status: stable Anesthetic complications: no     Last Vitals:  Vitals:   10/25/16 1429 10/25/16 1506  BP: (!) 154/74 (!) 162/89  Pulse: 71 71  Resp: 15 16  Temp: 36.8 C 36.4 C    Last Pain:  Vitals:   10/25/16 1506  TempSrc: Temporal  PainSc:                  KEPHART,WILLIAM K

## 2016-10-25 NOTE — Anesthesia Procedure Notes (Signed)
Anesthesia Regional Block: Supraclavicular block   Pre-Anesthetic Checklist: ,, timeout performed, Correct Patient, Correct Site, Correct Laterality, Correct Procedure, Correct Position, site marked, Risks and benefits discussed,  Surgical consent,  Pre-op evaluation,  At surgeon's request and post-op pain management  Laterality: Left  Prep: chloraprep       Needles:  Injection technique: Single-shot  Needle Type: Stimiplex     Needle Length: 9cm  Needle Gauge: 22     Additional Needles:   Procedures: ultrasound guided,,,,,,,,  Narrative:  Start time: 10/25/2016 11:30 AM End time: 10/25/2016 11:40 AM Injection made incrementally with aspirations every 5 mL.  Performed by: Personally  Anesthesiologist: Wetzel Meester  Additional Notes: Functioning IV was confirmed and monitors were applied.  A 61mm 22ga Stimuplex needle was used. Sterile prep and drape,hand hygiene and sterile gloves were used.  Negative aspiration and negative test dose prior to incremental administration of local anesthetic. The patient tolerated the procedure well.

## 2016-10-26 ENCOUNTER — Encounter: Payer: Self-pay | Admitting: Vascular Surgery

## 2016-10-31 ENCOUNTER — Other Ambulatory Visit (INDEPENDENT_AMBULATORY_CARE_PROVIDER_SITE_OTHER): Payer: Self-pay | Admitting: Vascular Surgery

## 2016-11-01 ENCOUNTER — Other Ambulatory Visit
Admission: RE | Admit: 2016-11-01 | Discharge: 2016-11-01 | Disposition: A | Discharge status: Home / Self Care | Payer: Medicare HMO | Source: Ambulatory Visit | Attending: Vascular Surgery | Admitting: Vascular Surgery

## 2016-11-01 ENCOUNTER — Ambulatory Visit
Admission: RE | Admit: 2016-11-01 | Discharge: 2016-11-01 | Disposition: A | Discharge status: Home / Self Care | Payer: Medicare HMO | Source: Ambulatory Visit | Attending: Vascular Surgery | Admitting: Vascular Surgery

## 2016-11-01 DIAGNOSIS — N186 End stage renal disease: Secondary | ICD-10-CM | POA: Insufficient documentation

## 2016-11-01 DIAGNOSIS — R197 Diarrhea, unspecified: Secondary | ICD-10-CM | POA: Insufficient documentation

## 2016-11-01 MED ORDER — HYDROMORPHONE HCL 1 MG/ML IJ SOLN: 1.0000 mg | Freq: Once | INTRAMUSCULAR | Status: DC | PRN

## 2016-11-01 MED ORDER — METHYLPREDNISOLONE SODIUM SUCC 125 MG IJ SOLR: 125.0000 mg | INTRAMUSCULAR | Status: DC | PRN

## 2016-11-01 MED ORDER — ONDANSETRON HCL 4 MG/2ML IJ SOLN: 4.0000 mg | Freq: Four times a day (QID) | INTRAMUSCULAR | Status: DC | PRN

## 2016-11-01 MED ORDER — SODIUM CHLORIDE 0.9 % IV SOLN: INTRAVENOUS | Status: DC

## 2016-11-01 MED ORDER — SODIUM CHLORIDE 0.9 % IV SOLN
5.0000 mg | Freq: Once | INTRAVENOUS | Status: AC
  Administered 2016-11-01: 5 mg via INTRAVENOUS
  Filled 2016-11-01: qty 5

## 2016-11-01 MED ORDER — CEFAZOLIN SODIUM-DEXTROSE 2-4 GM/100ML-% IV SOLN: 2.0000 g | Freq: Once | INTRAVENOUS | Status: DC

## 2016-11-01 MED ORDER — FAMOTIDINE 20 MG PO TABS: 40.0000 mg | ORAL_TABLET | ORAL | Status: DC | PRN

## 2016-11-01 MED ORDER — SODIUM CHLORIDE 0.9 % IR SOLN
Freq: Once | Status: DC
  Filled 2016-11-01: qty 2

## 2016-11-17 ENCOUNTER — Other Ambulatory Visit
Admission: RE | Admit: 2016-11-17 | Discharge: 2016-11-17 | Disposition: A | Discharge status: Home / Self Care | Payer: Medicare HMO | Source: Ambulatory Visit | Attending: Internal Medicine | Admitting: Internal Medicine

## 2016-11-17 DIAGNOSIS — R197 Diarrhea, unspecified: Secondary | ICD-10-CM | POA: Insufficient documentation

## 2016-11-17 LAB — GASTROINTESTINAL PANEL BY PCR, STOOL (REPLACES STOOL CULTURE)

## 2016-11-17 LAB — C DIFFICILE QUICK SCREEN W PCR REFLEX
C DIFFICILE (CDIFF) TOXIN: NEGATIVE
C DIFFICLE (CDIFF) ANTIGEN: POSITIVE — AB

## 2016-11-17 LAB — CLOSTRIDIUM DIFFICILE BY PCR: Toxigenic C. Difficile by PCR: POSITIVE — AB

## 2016-11-22 ENCOUNTER — Ambulatory Visit (INDEPENDENT_AMBULATORY_CARE_PROVIDER_SITE_OTHER): Payer: Medicare HMO | Admitting: Vascular Surgery

## 2016-11-22 ENCOUNTER — Encounter (INDEPENDENT_AMBULATORY_CARE_PROVIDER_SITE_OTHER): Payer: Self-pay | Admitting: Vascular Surgery

## 2016-11-22 VITALS — BP 158/76 | HR 72 | Resp 16 | Ht 72.0 in | Wt 240.0 lb

## 2016-11-22 DIAGNOSIS — N186 End stage renal disease: Secondary | ICD-10-CM

## 2016-11-22 DIAGNOSIS — I10 Essential (primary) hypertension: Secondary | ICD-10-CM

## 2016-11-22 DIAGNOSIS — E785 Hyperlipidemia, unspecified: Secondary | ICD-10-CM

## 2016-11-22 DIAGNOSIS — Z992 Dependence on renal dialysis: Secondary | ICD-10-CM

## 2016-11-22 NOTE — Progress Notes (Signed)
Subjective:    Patient ID: Terry Tyler, male    DOB: 1951-01-07, 66 y.o.   MRN: [id number] Chief Complaint  Patient presents with  . Re-evaluation    4 week [facility] no studies    Patient presents for his first postoperative follow-up. Patient is status post creation of a radiocephalic AV fistula in the left upper extremity on 10/25/2016. He presents today without complaint. His postoperative course has uneventful. Denies any upper extremity/hand pain. His right IJ catheter is functioning without issue. Patient denies any fever, nausea or vomiting.    Review of Systems  Constitutional: Negative.   HENT: Negative.   Eyes: Negative.   Respiratory: Negative.   Cardiovascular: Negative.   Gastrointestinal: Negative.   Endocrine: Negative.   Genitourinary: Negative.   Musculoskeletal: Negative.   Skin: Negative.   Allergic/Immunologic: Negative.   Neurological: Negative.   Hematological: Negative.   Psychiatric/Behavioral: Negative.        Objective:   Physical Exam  Constitutional: He is oriented to person, place, and time. He appears well-developed and well-nourished.  HENT:  Head: Normocephalic and atraumatic.  Eyes: Conjunctivae are normal. Pupils are equal, round, and reactive to light.  Neck: Normal range of motion.  Cardiovascular: Normal rate, regular rhythm, normal heart sounds and intact distal pulses.   Pulses:      Radial pulses are 2+ on the right side, and 2+ on the left side.       Dorsalis pedis pulses are 2+ on the right side, and 2+ on the left side.       Posterior tibial pulses are 2+ on the right side, and 2+ on the left side.  Left cephalic fistula: Healing well with good bruit and thrill noted. Left hand is warm  Pulmonary/Chest: Effort normal.  Right IJ catheter intact, no infection noted.  Neurological: He is alert and oriented to person, place, and time.  Skin: Skin is warm and dry.  Psychiatric: He has a normal mood and affect. His behavior is  normal. Judgment and thought content normal.    BP (!) 158/76 (BP Location: Right Arm)   Pulse 72   Resp 16   Ht 6' (1.829 m)   Wt 240 lb (108.9 kg)   BMI 32.55 kg/m   Past Medical History:  Diagnosis Date  . CKD (chronic kidney disease)   . Coronary atherosclerosis of unspecified type of vessel, native or graft    Myocardial infarction in 2006. Cardiac catheterization showed significant three-vessel coronary artery disease. He underwent CABG at [facility]. Most recent cardiac catheterization in 2010 showed normal LV systolic function, patent grafts to OM1, RCA and LAD. Occluded SVG to diagonal.  . 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 Dialysis patient [facility])    Mon. Wed. Fri. for dialysis  . Hyperlipidemia   . Hypertension   . Hypertension, renal disease   . Impotence of organic origin   . Keloid scar   . Myocardial infarction ([place]) 2006  . Neoplasm of uncertain behavior, site unspecified   . Peripheral vascular disease, unspecified ([place])   . Proteinuria   . Stroke [facility]) 04/2016   TIA  . Unspecified vitamin D deficiency     Social History   Social History  . Marital status: Married    Spouse name: N/A  . Number of children: N/A  . Years of education: N/A   Occupational History  . Not on file.   Social History Main Topics  .  Smoking status: Former Smoker    Packs/day: 0.50    Years: 0.00    Types: Cigarettes    Quit date: 06/11/1986  . Smokeless tobacco: Never Used  . Alcohol use No     Comment: socially  . Drug use: No  . Sexual activity: Not Currently   Other Topics Concern  . Not on file   Social History Narrative  . No narrative on file    Past Surgical History:  Procedure Laterality Date  . AV FISTULA PLACEMENT Left 10/25/2016   Procedure: ARTERIOVENOUS (AV) FISTULA CREATION ( RADIOCEPHALIC );  Surgeon: Algernon Huxley, MD;  Location: [facility] ORS;  Service: Vascular;  Laterality: Left;  . CARDIAC CATHETERIZATION  12/2004    [facility];[place]  . CARDIAC CATHETERIZATION  09/2008   [facility];[place]  . CORONARY ANGIOPLASTY    . CORONARY ARTERY BYPASS GRAFT  2006   [facility]  . CORONARY STENT INTERVENTION N/A 09/10/2016   Procedure: Coronary Stent Intervention;  Surgeon: Isaias Cowman, MD;  Location: [place] CV LAB;  Service: Cardiovascular;  Laterality: N/A;  . keloid removal     right neck  . LEFT HEART CATH AND CORONARY ANGIOGRAPHY N/A 09/10/2016   Procedure: Left Heart Cath and Coronary Angiography;  Surgeon: Isaias Cowman, MD;  Location: [place] CV LAB;  Service: Cardiovascular;  Laterality: N/A;    Family History  Problem Relation Age of Onset  . Hypertension Mother   . Hyperlipidemia Mother   . Heart disease Mother   . Heart disease Father   . Hyperlipidemia Father   . Hypertension Father     Allergies  Allergen Reactions  . Ezetimibe Other (See Comments)    Cannot remember what the allergy was.  . Lipitor [Atorvastatin] Other (See Comments)    Muscle cramping       Assessment & Plan:  Patient presents for his first postoperative follow-up. Patient is status post creation of a radiocephalic AV fistula in the left upper extremity on 10/25/2016. He presents today without complaint. His postoperative course has uneventful. Denies any upper extremity/hand pain. His right IJ catheter is functioning without issue. Patient denies any fever, nausea or vomiting.  1. ESRD on dialysis ([place]) - Stable Patient presents for his first postoperative follow-up. His postoperative course has been uneventful. He denies any left upper extremity or hand pain It is too early to access his fistula at this time. We'll bring patient back in about 3-4 weeks for an HDA and assessed the fistula. Continue to use the PermCath until fistula is matured.  - VAS US DUPLEX DIALYSIS ACCESS (AVF,AVG); Future  2. Hyperlipidemia, unspecified hyperlipidemia type - stable Encouraged good control as its slows the  progression of atherosclerotic disease  3. Essential hypertension - stable Encouraged good control as its slows the progression of atherosclerotic disease   Current Outpatient Prescriptions on File Prior to Visit  Medication Sig Dispense Refill  . aspirin EC 81 MG tablet Take 81 mg by mouth every evening.    . cinacalcet (SENSIPAR) 30 MG tablet Take 30 mg by mouth daily.     . clopidogrel (PLAVIX) 75 MG tablet Take 1 tablet (75 mg total) by mouth daily. (Patient taking differently: Take 75 mg by mouth every evening. ) 30 tablet 1  . fluticasone (FLONASE) 50 MCG/ACT nasal spray Place 1 spray into both nostrils daily as needed. For allergies.    . furosemide (LASIX) 40 MG tablet Take 40 mg by mouth 2 (two) times daily.    Marland Kitchen gabapentin (  NEURONTIN) 300 MG capsule Take 300 mg by mouth 3 (three) times daily.    . hydrALAZINE (APRESOLINE) 50 MG tablet Take 50 mg by mouth 2 (two) times daily.    Marland Kitchen HYDROcodone-acetaminophen (NORCO) 5-325 MG tablet Take 1 tablet by mouth every 6 (six) hours as needed for moderate pain. 30 tablet 0  . insulin glargine (LANTUS) 100 UNIT/ML injection Inject 0.25 mLs (25 Units total) into the skin at bedtime. Takes 80 units at bedtimed;Titrate 4 units every 4 days unitl FBG are in the 130s. (Patient taking differently: Inject 25 Units into the skin every evening. ) 10 mL 11  . insulin lispro (HUMALOG) 100 UNIT/ML injection Inject 2-15 Units into the skin 3 (three) times daily before meals. Sliding scale (typically ~10 units with each meal)    . Insulin Pen Needle (PRODIGY MINI PEN NEEDLES) 31G X 5 MM MISC by Does not apply route as directed.    Marland Kitchen lanthanum (FOSRENOL) 1000 MG chewable tablet Chew 1,000 mg by mouth 3 (three) times daily with meals.    . metoprolol succinate (TOPROL-XL) 25 MG 24 hr tablet Take 25 mg by mouth every evening.     . rosuvastatin (CRESTOR) 20 MG tablet Take 20 mg by mouth at bedtime.      No current facility-administered medications on file prior  to visit.     There are no Patient Instructions on file for this visit. No Follow-up on file.   Jannely Henthorn A [place] Krotz, PA-C

## 2016-12-25 ENCOUNTER — Other Ambulatory Visit
Admission: RE | Admit: 2016-12-25 | Discharge: 2016-12-25 | Disposition: A | Discharge status: Home / Self Care | Payer: Medicare HMO | Source: Ambulatory Visit | Attending: Gastroenterology | Admitting: Gastroenterology

## 2016-12-25 ENCOUNTER — Encounter (INDEPENDENT_AMBULATORY_CARE_PROVIDER_SITE_OTHER): Payer: Self-pay | Admitting: Vascular Surgery

## 2016-12-25 ENCOUNTER — Emergency Department
Admission: EM | Admit: 2016-12-25 | Discharge: 2016-12-25 | Disposition: A | Discharge status: Home / Self Care | Payer: Medicare HMO | Attending: Emergency Medicine | Admitting: Emergency Medicine

## 2016-12-25 ENCOUNTER — Emergency Department: Payer: Medicare HMO

## 2016-12-25 ENCOUNTER — Encounter (INDEPENDENT_AMBULATORY_CARE_PROVIDER_SITE_OTHER): Payer: Self-pay

## 2016-12-25 ENCOUNTER — Encounter: Payer: Self-pay | Admitting: Emergency Medicine

## 2016-12-25 ENCOUNTER — Ambulatory Visit (INDEPENDENT_AMBULATORY_CARE_PROVIDER_SITE_OTHER): Payer: Medicare HMO | Admitting: Vascular Surgery

## 2016-12-25 ENCOUNTER — Ambulatory Visit (INDEPENDENT_AMBULATORY_CARE_PROVIDER_SITE_OTHER): Payer: Medicare HMO

## 2016-12-25 VITALS — BP 141/72 | HR 84 | Resp 18 | Ht 72.0 in | Wt 228.0 lb

## 2016-12-25 DIAGNOSIS — R197 Diarrhea, unspecified: Secondary | ICD-10-CM

## 2016-12-25 DIAGNOSIS — I2510 Atherosclerotic heart disease of native coronary artery without angina pectoris: Secondary | ICD-10-CM | POA: Diagnosis not present

## 2016-12-25 DIAGNOSIS — N186 End stage renal disease: Secondary | ICD-10-CM

## 2016-12-25 DIAGNOSIS — N189 Chronic kidney disease, unspecified: Secondary | ICD-10-CM | POA: Insufficient documentation

## 2016-12-25 DIAGNOSIS — Z7902 Long term (current) use of antithrombotics/antiplatelets: Secondary | ICD-10-CM | POA: Insufficient documentation

## 2016-12-25 DIAGNOSIS — I252 Old myocardial infarction: Secondary | ICD-10-CM | POA: Diagnosis not present

## 2016-12-25 DIAGNOSIS — Z79899 Other long term (current) drug therapy: Secondary | ICD-10-CM | POA: Diagnosis not present

## 2016-12-25 DIAGNOSIS — R112 Nausea with vomiting, unspecified: Secondary | ICD-10-CM | POA: Insufficient documentation

## 2016-12-25 DIAGNOSIS — Z992 Dependence on renal dialysis: Secondary | ICD-10-CM

## 2016-12-25 DIAGNOSIS — R531 Weakness: Secondary | ICD-10-CM | POA: Diagnosis not present

## 2016-12-25 DIAGNOSIS — E785 Hyperlipidemia, unspecified: Secondary | ICD-10-CM

## 2016-12-25 DIAGNOSIS — Z87891 Personal history of nicotine dependence: Secondary | ICD-10-CM | POA: Insufficient documentation

## 2016-12-25 DIAGNOSIS — A0472 Enterocolitis due to Clostridium difficile, not specified as recurrent: Secondary | ICD-10-CM | POA: Insufficient documentation

## 2016-12-25 DIAGNOSIS — I129 Hypertensive chronic kidney disease with stage 1 through stage 4 chronic kidney disease, or unspecified chronic kidney disease: Secondary | ICD-10-CM | POA: Diagnosis not present

## 2016-12-25 DIAGNOSIS — I10 Essential (primary) hypertension: Secondary | ICD-10-CM

## 2016-12-25 LAB — CBC
HCT: 34.5 % — ABNORMAL LOW (ref 40.0–52.0)
Hemoglobin: 11.4 g/dL — ABNORMAL LOW (ref 13.0–18.0)
MCH: 25.6 pg — AB (ref 26.0–34.0)
MCHC: 32.9 g/dL (ref 32.0–36.0)
MCV: 77.8 fL — AB (ref 80.0–100.0)
PLATELETS: 271 10*3/uL (ref 150–440)
RBC: 4.43 MIL/uL (ref 4.40–5.90)
RDW: 17.8 % — AB (ref 11.5–14.5)
WBC: 12.7 10*3/uL — AB (ref 3.8–10.6)

## 2016-12-25 LAB — C DIFFICILE QUICK SCREEN W PCR REFLEX
C Diff antigen: POSITIVE — AB
C Diff toxin: NEGATIVE

## 2016-12-25 LAB — COMPREHENSIVE METABOLIC PANEL
ALK PHOS: 484 U/L — AB (ref 38–126)
ALT: 39 U/L (ref 17–63)
AST: 43 U/L — AB (ref 15–41)
Albumin: 3.2 g/dL — ABNORMAL LOW (ref 3.5–5.0)
Anion gap: 13 (ref 5–15)
BUN: 16 mg/dL (ref 6–20)
CALCIUM: 9.5 mg/dL (ref 8.9–10.3)
CO2: 27 mmol/L (ref 22–32)
CREATININE: 5.54 mg/dL — AB (ref 0.61–1.24)
Chloride: 97 mmol/L — ABNORMAL LOW (ref 101–111)
GFR calc Af Amer: 11 mL/min — ABNORMAL LOW (ref >60)
GFR, EST NON AFRICAN AMERICAN: 10 mL/min — AB (ref >60)
Glucose, Bld: 179 mg/dL — ABNORMAL HIGH (ref 65–99)
Potassium: 2.9 mmol/L — ABNORMAL LOW (ref 3.5–5.1)
Sodium: 137 mmol/L (ref 135–145)
Total Bilirubin: 3.2 mg/dL — ABNORMAL HIGH (ref 0.3–1.2)
Total Protein: 8.5 g/dL — ABNORMAL HIGH (ref 6.5–8.1)

## 2016-12-25 LAB — LIPASE, BLOOD: LIPASE: 21 U/L (ref 11–51)

## 2016-12-25 LAB — CLOSTRIDIUM DIFFICILE BY PCR: Toxigenic C. Difficile by PCR: POSITIVE — AB

## 2016-12-25 MED ORDER — ONDANSETRON 4 MG PO TBDP
4.0000 mg | ORAL_TABLET | Freq: Once | ORAL | Status: AC
  Administered 2016-12-25: 4 mg via ORAL
  Filled 2016-12-25: qty 1

## 2016-12-25 MED ORDER — VANCOMYCIN HCL 125 MG PO CAPS: 125.0000 mg | ORAL_CAPSULE | Freq: Four times a day (QID) | ORAL | 0 refills | Status: DC

## 2016-12-25 MED ORDER — ONDANSETRON 4 MG PO TBDP: 4.0000 mg | ORAL_TABLET | Freq: Three times a day (TID) | ORAL | 0 refills | Status: DC | PRN

## 2016-12-25 NOTE — ED Provider Notes (Signed)
[facility] Emergency Department Provider Note  Time seen: 11:25 AM  I have reviewed the triage vital signs and the nursing notes.   HISTORY  Chief Complaint Abdominal Pain and Diarrhea    HPI Terry Tyler is a 66 y.o. male with a past medical history of end-stage renal disease on hemodialysis Monday, Wednesday, Friday, diabetes, hypertension, hyperlipidemia, presents the emergency department for nausea and diarrhea and weakness. According to the patient for the past 2 months he has been nauseated with vomiting. He states typically he is coughing which triggers the vomiting. States a sore throat due to the constant coughing. He also states he is having 1-2 episodes of diarrhea each day for the past 2 months. Denies any black or blood in his stool or vomit. Denies any abdominal pain. Denies any fever. States cough but denies sputum production. States the cough has been ongoing for months as well. Patient started dialysis approximately 8 months ago.  Past Medical History:  Diagnosis Date  . CKD (chronic kidney disease)   . Coronary atherosclerosis of unspecified type of vessel, native or graft    Myocardial infarction in 2006. Cardiac catheterization showed significant three-vessel coronary artery disease. He underwent CABG at [facility]. Most recent cardiac catheterization in 2010 showed normal LV systolic function, patent grafts to OM1, RCA and LAD. Occluded SVG to diagonal.  . 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 Dialysis patient [facility])    Mon. Wed. Fri. for dialysis  . Hyperlipidemia   . Hypertension   . Hypertension, renal disease   . Impotence of organic origin   . Keloid scar   . Myocardial infarction ([place]) 2006  . Neoplasm of uncertain behavior, site unspecified   . Peripheral vascular disease, unspecified ([place])   . Proteinuria   . Stroke [facility]) 04/2016   TIA  . Unspecified vitamin D deficiency     Patient Active Problem List    Diagnosis Date Noted  . ESRD on dialysis ([place]) 10/16/2016  . Elevated troponin 09/08/2016  . CVA (cerebral vascular accident) ([place]) 04/09/2016  . PAD (peripheral artery disease) ([place]) 04/10/2012  . Coronary atherosclerosis of unspecified type of vessel, native or graft   . Hyperlipidemia   . Hypertension     Past Surgical History:  Procedure Laterality Date  . AV FISTULA PLACEMENT Left 10/25/2016   Procedure: ARTERIOVENOUS (AV) FISTULA CREATION ( RADIOCEPHALIC );  Surgeon: Algernon Huxley, MD;  Location: [facility] ORS;  Service: Vascular;  Laterality: Left;  . CARDIAC CATHETERIZATION  12/2004   [facility]  . CARDIAC CATHETERIZATION  09/2008   [facility]  . CORONARY ANGIOPLASTY    . CORONARY ARTERY BYPASS GRAFT  2006   [facility]  . CORONARY STENT INTERVENTION N/A 09/10/2016   Procedure: Coronary Stent Intervention;  Surgeon: Isaias Cowman, MD;  Location: [place] CV LAB;  Service: Cardiovascular;  Laterality: N/A;  . keloid removal     right neck  . LEFT HEART CATH AND CORONARY ANGIOGRAPHY N/A 09/10/2016   Procedure: Left Heart Cath and Coronary Angiography;  Surgeon: Isaias Cowman, MD;  Location: [place] CV LAB;  Service: Cardiovascular;  Laterality: N/A;    Prior to Admission medications   Medication Sig Start Date End Date Taking? Authorizing Provider  aspirin EC 81 MG tablet Take 81 mg by mouth every evening.    [provider]  cinacalcet (SENSIPAR) 30 MG tablet Take 30 mg by mouth daily.  08/24/16   [provider]  clopidogrel (PLAVIX) 75 MG tablet Take 1 tablet (75 mg total) by mouth daily. Patient taking differently: Take 75 mg by mouth every evening.  04/11/16   Fritzi Mandes, MD  fluticasone (FLONASE) 50 MCG/ACT nasal spray Place 1 spray into both nostrils daily as needed. For allergies. 10/11/16   [provider]  furosemide (LASIX) 40 MG tablet Take 40 mg by mouth 2 (two) times daily.    [provider]  gabapentin (NEURONTIN)  300 MG capsule Take 300 mg by mouth 3 (three) times daily.    [provider]  hydrALAZINE (APRESOLINE) 50 MG tablet Take 50 mg by mouth 2 (two) times daily.    [provider]  HYDROcodone-acetaminophen (NORCO) 5-325 MG tablet Take 1 tablet by mouth every 6 (six) hours as needed for moderate pain. 10/25/16   Algernon Huxley, MD  insulin glargine (LANTUS) 100 UNIT/ML injection Inject 0.25 mLs (25 Units total) into the skin at bedtime. Takes 80 units at bedtimed;Titrate 4 units every 4 days unitl FBG are in the 130s. Patient taking differently: Inject 25 Units into the skin every evening.  04/11/16   Fritzi Mandes, MD  insulin lispro (HUMALOG) 100 UNIT/ML injection Inject 2-15 Units into the skin 3 (three) times daily before meals. Sliding scale (typically ~10 units with each meal)    [provider]  Insulin Pen Needle (PRODIGY MINI PEN NEEDLES) 31G X 5 MM MISC by Does not apply route as directed.    [provider]  lanthanum (FOSRENOL) 1000 MG chewable tablet Chew 1,000 mg by mouth 3 (three) times daily with meals.    [provider]  metoprolol succinate (TOPROL-XL) 25 MG 24 hr tablet Take 25 mg by mouth every evening.     [provider]  ondansetron (ZOFRAN) 4 MG tablet take 1 tablet by mouth every 8 hours if needed for nausea 11/07/16   [provider]  promethazine (PHENERGAN) 12.5 MG tablet Take by mouth. 12/18/16   [provider]  rosuvastatin (CRESTOR) 20 MG tablet Take 20 mg by mouth at bedtime.     [provider]    Allergies  Allergen Reactions  . Ezetimibe Other (See Comments)    Cannot remember what the allergy was.  . Lipitor [Atorvastatin] Other (See Comments)    Muscle cramping    Family History  Problem Relation Age of Onset  . Hypertension Mother   . Hyperlipidemia Mother   . Heart disease Mother   . Heart disease Father   . Hyperlipidemia Father   . Hypertension Father     Social  History Social History  Substance Use Topics  . Smoking status: Former Smoker    Packs/day: 0.50    Years: 0.00    Types: Cigarettes    Quit date: 06/11/1986  . Smokeless tobacco: Never Used  . Alcohol use No     Comment: socially    Review of Systems Constitutional: Negative for fever. Cardiovascular: Negative for chest pain. Respiratory: Negative for shortness of breath.Positive for cough. Negative for sputum production Gastrointestinal: Negative for abdominal pain. Positive for nausea, vomiting, diarrhea Genitourinary: Negative for dysuria. Neurological: Negative for headache All other ROS negative  ____________________________________________   PHYSICAL EXAM:  VITAL SIGNS: ED Triage Vitals [12/25/16 1102]  Enc Vitals Group     BP (!) 159/77     Pulse Rate 78     Resp 18     Temp 98.8 F (37.1 C)     Temp Source Oral  SpO2 96 %     Weight      Height      Head Circumference      Peak Flow      Pain Score 0     Pain Loc      Pain Edu?      Excl. in [place]?     Constitutional: Alert and oriented. Well appearing and in no distress. Eyes: Normal exam ENT   Head: Normocephalic and atraumatic.   Mouth/Throat: Mucous membranes are moist. Cardiovascular: Normal rate, regular rhythm. No murmur Respiratory: Normal respiratory effort without tachypnea nor retractions. Breath sounds are clear  Gastrointestinal: Soft and nontender. No distention.   Musculoskeletal: Left upper extremity AV fistula which is not yet in use. Right subclavian dialysis line. Neurologic:  Normal speech and language. No gross focal neurologic deficits  Skin:  Skin is warm, dry and intact.  Psychiatric: Mood and affect are normal.   ____________________________________________   RADIOLOGY  IMPRESSION: 1. Probable small gallbladder polyps measuring up to 5 mm. Given size, these normal certainly benign, with no further follow-up imaging recommended. 2. Gallbladder wall thickening up  to 7 mm, which may in part be related to incomplete distension. No other cholelithiasis or other sonographic features to suggest acute cholecystitis. 3. No biliary dilatation. 4. Hepatic steatosis.  ____________________________________________   INITIAL IMPRESSION / ASSESSMENT AND PLAN / ED COURSE  Pertinent labs & imaging results that were available during my care of the patient were reviewed by me and considered in my medical decision making (see chart for details).  Patient presents the emergency department for months of cough, 2 months of daily nausea with intermittent vomiting which she states is usually following coughing spells. He also states 1-2 episodes of loose stool each day for the past 2 months. Patient states over the past several days he has felt increasingly weak. He was able to go to dialysis yesterday and had his full session performed but continues to feel weak so he came to the emergency department for evaluation. Patient does urinate every day. Denies any headache, focal weakness or numbness. Exam overall the patient appears very well, no distress. States mild nausea currently. We will obtain a chest x-ray, lab work, treat with Zofran and continue to monitor closely in the emergency department while awaiting workup. Reassuringly the patient has a completely nontender abdominal exam.   Ultrasound largely negative. C. difficile testing is positive. I discussed the patient with Dr. Ola Spurr who recommends starting oral vancomycin 125 mg 4 times a day for the next 14 days. Patient is currently seeing GI medicine already for this and will follow-up with him. Patient agreeable to this plan. I discussed discontinuing Flagyl and starting vancomycin.  ____________________________________________   FINAL CLINICAL IMPRESSION(S) / ED DIAGNOSES  Nausea vomiting diarrhea Weakness Clostridium difficile diarrhea   Harvest Dark, MD 12/25/16 1542

## 2016-12-25 NOTE — Progress Notes (Signed)
Patient ID: Terry Tyler, male   DOB: 1951/03/11, 66 y.o.   MRN: [id number]  Chief Complaint  Patient presents with  . Re-evaluation    3 weeks HDA u/s follow up    HPI Terry Tyler is a 66 y.o. male.  Patient returns in follow-up about 2 months status post left radiocephalic AV fistula creation. His skin is well-healed and he has no complaints today. He is not having pain. He does not have steal symptoms. His duplex today shows what appears to be occlusion of the cephalic vein itself in the mid forearm with fistula flow through a collateral. This is not large enough to be stuck for dialysis.   Past Medical History:  Diagnosis Date  . CKD (chronic kidney disease)   . Coronary atherosclerosis of unspecified type of vessel, native or graft    Myocardial infarction in 2006. Cardiac catheterization showed significant three-vessel coronary artery disease. He underwent CABG at [facility]. Most recent cardiac catheterization in 2010 showed normal LV systolic function, patent grafts to OM1, RCA and LAD. Occluded SVG to diagonal.  . 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 Dialysis patient [facility])    Mon. Wed. Fri. for dialysis  . Hyperlipidemia   . Hypertension   . Hypertension, renal disease   . Impotence of organic origin   . Keloid scar   . Myocardial infarction ([place]) 2006  . Neoplasm of uncertain behavior, site unspecified   . Peripheral vascular disease, unspecified ([place])   . Proteinuria   . Stroke [facility]) 04/2016   TIA  . Unspecified vitamin D deficiency     Past Surgical History:  Procedure Laterality Date  . AV FISTULA PLACEMENT Left 10/25/2016   Procedure: ARTERIOVENOUS (AV) FISTULA CREATION ( RADIOCEPHALIC );  Surgeon: Algernon Huxley, MD;  Location: [facility] ORS;  Service: Vascular;  Laterality: Left;  . CARDIAC CATHETERIZATION  12/2004   [facility]  . CARDIAC CATHETERIZATION  09/2008   [facility]  . CORONARY ANGIOPLASTY    . CORONARY ARTERY BYPASS  GRAFT  2006   [facility]  . CORONARY STENT INTERVENTION N/A 09/10/2016   Procedure: Coronary Stent Intervention;  Surgeon: Isaias Cowman, MD;  Location: [place] CV LAB;  Service: Cardiovascular;  Laterality: N/A;  . keloid removal     right neck  . LEFT HEART CATH AND CORONARY ANGIOGRAPHY N/A 09/10/2016   Procedure: Left Heart Cath and Coronary Angiography;  Surgeon: Isaias Cowman, MD;  Location: [place] CV LAB;  Service: Cardiovascular;  Laterality: N/A;      Allergies  Allergen Reactions  . Ezetimibe Other (See Comments)    Cannot remember what the allergy was.  . Lipitor [Atorvastatin] Other (See Comments)    Muscle cramping    Current Outpatient Prescriptions  Medication Sig Dispense Refill  . aspirin EC 81 MG tablet Take 81 mg by mouth every evening.    . cinacalcet (SENSIPAR) 30 MG tablet Take 30 mg by mouth daily.     . clopidogrel (PLAVIX) 75 MG tablet Take 1 tablet (75 mg total) by mouth daily. (Patient taking differently: Take 75 mg by mouth every evening. ) 30 tablet 1  . fluticasone (FLONASE) 50 MCG/ACT nasal spray Place 1 spray into both nostrils daily as needed. For allergies.    . furosemide (LASIX) 40 MG tablet Take 40 mg by mouth 2 (two) times daily.    Marland Kitchen gabapentin (NEURONTIN) 300 MG capsule Take 300 mg by mouth 3 (three) times  daily.    . hydrALAZINE (APRESOLINE) 50 MG tablet Take 50 mg by mouth 2 (two) times daily.    Marland Kitchen HYDROcodone-acetaminophen (NORCO) 5-325 MG tablet Take 1 tablet by mouth every 6 (six) hours as needed for moderate pain. 30 tablet 0  . insulin glargine (LANTUS) 100 UNIT/ML injection Inject 0.25 mLs (25 Units total) into the skin at bedtime. Takes 80 units at bedtimed;Titrate 4 units every 4 days unitl FBG are in the 130s. (Patient taking differently: Inject 25 Units into the skin every evening. ) 10 mL 11  . insulin lispro (HUMALOG) 100 UNIT/ML injection Inject 2-15 Units into the skin 3 (three) times daily before meals. Sliding  scale (typically ~10 units with each meal)    . Insulin Pen Needle (PRODIGY MINI PEN NEEDLES) 31G X 5 MM MISC by Does not apply route as directed.    Marland Kitchen lanthanum (FOSRENOL) 1000 MG chewable tablet Chew 1,000 mg by mouth 3 (three) times daily with meals.    . metoprolol succinate (TOPROL-XL) 25 MG 24 hr tablet Take 25 mg by mouth every evening.     . ondansetron (ZOFRAN) 4 MG tablet take 1 tablet by mouth every 8 hours if needed for nausea  0  . promethazine (PHENERGAN) 12.5 MG tablet Take by mouth.    . rosuvastatin (CRESTOR) 20 MG tablet Take 20 mg by mouth at bedtime.      No current facility-administered medications for this visit.         Physical Exam BP (!) 141/72 (BP Location: Right Arm)   Pulse 84   Resp 18   Ht 6' (1.829 m)   Wt 228 lb (103.4 kg)   BMI 30.92 kg/m  Gen:  WD/WN, NAD Skin: incision C/D/I. Good thrill is present in the fistula.     Assessment/Plan:  Hyperlipidemia lipid control important in reducing the progression of atherosclerotic disease. Continue statin therapy   Hypertension blood pressure control important in reducing the progression of atherosclerotic disease. On appropriate oral medications.   ESRD on dialysis [facility]) His duplex today shows what appears to be occlusion of the cephalic vein itself in the mid forearm with fistula flow through a collateral. This is not large enough to be stuck for dialysis. Given this, I think he will need a fistulogram with likely intervention to help get this fistula functional for dialysis. The forearm veins are somewhat redundant, and the occlusion of the cephalic vein itself is not that big of a deal as long as we can enlarge the draining vein to a size that can be used for dialysis. Risks and benefits the procedure were discussed and he is agreeable to proceed.      [name] 12/25/2016, 10:58 AM   This note was created with Dragon medical transcription system.  Any errors from dictation are  unintentional.

## 2016-12-25 NOTE — Assessment & Plan Note (Signed)
lipid control important in reducing the progression of atherosclerotic disease. Continue statin therapy  

## 2016-12-25 NOTE — Assessment & Plan Note (Signed)
His duplex today shows what appears to be occlusion of the cephalic vein itself in the mid forearm with fistula flow through a collateral. This is not large enough to be stuck for dialysis. Given this, I think he will need a fistulogram with likely intervention to help get this fistula functional for dialysis. The forearm veins are somewhat redundant, and the occlusion of the cephalic vein itself is not that big of a deal as long as we can enlarge the draining vein to a size that can be used for dialysis. Risks and benefits the procedure were discussed and he is agreeable to proceed.

## 2016-12-25 NOTE — ED Notes (Signed)
Pt states vomiting and diarrhea x 2 months. Denies pain. States he will cough and then vomit. States 2 diarrhea per day. Denies blood in stool or vomit. denies fever. Dialysis since November, MWF. Has dialysis yesterday. Fistula L arm and port R chest. Alert and oriented.

## 2016-12-25 NOTE — Assessment & Plan Note (Signed)
blood pressure control important in reducing the progression of atherosclerotic disease. On appropriate oral medications.  

## 2016-12-25 NOTE — ED Notes (Signed)
Pt in US

## 2016-12-25 NOTE — ED Notes (Signed)
Pt in x-ray, will given ordered medication when pt returns.

## 2016-12-25 NOTE — ED Triage Notes (Signed)
Pt to ed with c/o abd pain, diarrhea and vomiting x 1 week.  Reports weakness today.  Had dialysis yesterday without problems.

## 2016-12-25 NOTE — ED Notes (Signed)
Pt has stool sample from GI outpatient that he collected last night at 10pm. Called lab and they stated this RN could send it to lab to be processed.

## 2016-12-25 NOTE — ED Notes (Signed)
Patient was given a specimen container but did not have to use the bathroom at this time.

## 2016-12-27 ENCOUNTER — Other Ambulatory Visit (INDEPENDENT_AMBULATORY_CARE_PROVIDER_SITE_OTHER): Payer: Self-pay

## 2017-01-02 MED ORDER — CEFAZOLIN SODIUM-DEXTROSE 2-4 GM/100ML-% IV SOLN
2.0000 g | Freq: Once | INTRAVENOUS | Status: AC
  Administered 2017-01-03: 2 g via INTRAVENOUS

## 2017-01-03 ENCOUNTER — Ambulatory Visit
Admission: RE | Admit: 2017-01-03 | Discharge: 2017-01-03 | Disposition: A | Discharge status: Home / Self Care | Payer: Medicare HMO | Source: Ambulatory Visit | Attending: Vascular Surgery | Admitting: Vascular Surgery

## 2017-01-03 ENCOUNTER — Encounter
Admission: RE | Disposition: A | Discharge status: Home / Self Care | Payer: Self-pay | Source: Ambulatory Visit | Attending: Vascular Surgery

## 2017-01-03 DIAGNOSIS — I252 Old myocardial infarction: Secondary | ICD-10-CM | POA: Diagnosis not present

## 2017-01-03 DIAGNOSIS — E1122 Type 2 diabetes mellitus with diabetic chronic kidney disease: Secondary | ICD-10-CM | POA: Diagnosis not present

## 2017-01-03 DIAGNOSIS — I120 Hypertensive chronic kidney disease with stage 5 chronic kidney disease or end stage renal disease: Secondary | ICD-10-CM | POA: Diagnosis not present

## 2017-01-03 DIAGNOSIS — E559 Vitamin D deficiency, unspecified: Secondary | ICD-10-CM | POA: Insufficient documentation

## 2017-01-03 DIAGNOSIS — N529 Male erectile dysfunction, unspecified: Secondary | ICD-10-CM | POA: Insufficient documentation

## 2017-01-03 DIAGNOSIS — Z992 Dependence on renal dialysis: Secondary | ICD-10-CM | POA: Diagnosis not present

## 2017-01-03 DIAGNOSIS — Y832 Surgical operation with anastomosis, bypass or graft as the cause of abnormal reaction of the patient, or of later complication, without mention of misadventure at the time of the procedure: Secondary | ICD-10-CM | POA: Diagnosis not present

## 2017-01-03 DIAGNOSIS — Z888 Allergy status to other drugs, medicaments and biological substances status: Secondary | ICD-10-CM | POA: Diagnosis not present

## 2017-01-03 DIAGNOSIS — T82868A Thrombosis of vascular prosthetic devices, implants and grafts, initial encounter: Secondary | ICD-10-CM | POA: Diagnosis not present

## 2017-01-03 DIAGNOSIS — Z794 Long term (current) use of insulin: Secondary | ICD-10-CM | POA: Diagnosis not present

## 2017-01-03 DIAGNOSIS — Z7902 Long term (current) use of antithrombotics/antiplatelets: Secondary | ICD-10-CM | POA: Insufficient documentation

## 2017-01-03 DIAGNOSIS — Z7982 Long term (current) use of aspirin: Secondary | ICD-10-CM | POA: Insufficient documentation

## 2017-01-03 DIAGNOSIS — T82898A Other specified complication of vascular prosthetic devices, implants and grafts, initial encounter: Secondary | ICD-10-CM | POA: Diagnosis present

## 2017-01-03 DIAGNOSIS — Z951 Presence of aortocoronary bypass graft: Secondary | ICD-10-CM | POA: Diagnosis not present

## 2017-01-03 DIAGNOSIS — N186 End stage renal disease: Secondary | ICD-10-CM | POA: Diagnosis not present

## 2017-01-03 DIAGNOSIS — Z955 Presence of coronary angioplasty implant and graft: Secondary | ICD-10-CM | POA: Insufficient documentation

## 2017-01-03 DIAGNOSIS — Z9889 Other specified postprocedural states: Secondary | ICD-10-CM | POA: Diagnosis not present

## 2017-01-03 DIAGNOSIS — I2510 Atherosclerotic heart disease of native coronary artery without angina pectoris: Secondary | ICD-10-CM | POA: Insufficient documentation

## 2017-01-03 DIAGNOSIS — E785 Hyperlipidemia, unspecified: Secondary | ICD-10-CM | POA: Insufficient documentation

## 2017-01-03 HISTORY — PX: A/V SHUNT INTERVENTION: CATH118220

## 2017-01-03 HISTORY — PX: A/V FISTULAGRAM: CATH118298

## 2017-01-03 LAB — POTASSIUM (ARMC VASCULAR LAB ONLY): Potassium (ARMC vascular lab): 3 — ABNORMAL LOW (ref 3.5–5.1)

## 2017-01-03 LAB — GLUCOSE, CAPILLARY: Glucose-Capillary: 190 mg/dL — ABNORMAL HIGH (ref 65–99)

## 2017-01-03 SURGERY — A/V FISTULAGRAM
Anesthesia: Moderate Sedation

## 2017-01-03 MED ORDER — METOPROLOL TARTRATE 5 MG/5ML IV SOLN: 2.0000 mg | INTRAVENOUS | Status: DC | PRN

## 2017-01-03 MED ORDER — LIDOCAINE-EPINEPHRINE (PF) 2 %-1:200000 IJ SOLN
INTRAMUSCULAR | Status: AC
Start: 2017-01-03 — End: 2017-01-03
  Filled 2017-01-03: qty 20

## 2017-01-03 MED ORDER — MIDAZOLAM HCL 5 MG/5ML IJ SOLN
INTRAMUSCULAR | Status: AC
Start: 2017-01-03 — End: 2017-01-03
  Filled 2017-01-03: qty 5

## 2017-01-03 MED ORDER — MIDAZOLAM HCL 2 MG/2ML IJ SOLN
INTRAMUSCULAR | Status: DC | PRN
  Administered 2017-01-03: 2 mg via INTRAVENOUS

## 2017-01-03 MED ORDER — ONDANSETRON HCL 4 MG/2ML IJ SOLN
INTRAMUSCULAR | Status: AC
  Filled 2017-01-03: qty 2

## 2017-01-03 MED ORDER — HEPARIN SODIUM (PORCINE) 1000 UNIT/ML IJ SOLN
INTRAMUSCULAR | Status: AC
Start: 2017-01-03 — End: 2017-01-03
  Filled 2017-01-03: qty 1

## 2017-01-03 MED ORDER — ACETAMINOPHEN 325 MG PO TABS: 325.0000 mg | ORAL_TABLET | ORAL | Status: DC | PRN

## 2017-01-03 MED ORDER — MORPHINE SULFATE (PF) 4 MG/ML IV SOLN: 2.0000 mg | INTRAVENOUS | Status: DC | PRN

## 2017-01-03 MED ORDER — HEPARIN SODIUM (PORCINE) 1000 UNIT/ML IJ SOLN
INTRAMUSCULAR | Status: DC | PRN
  Administered 2017-01-03: 4000 [IU] via INTRAVENOUS

## 2017-01-03 MED ORDER — IOPAMIDOL (ISOVUE-300) INJECTION 61%
INTRAVENOUS | Status: DC | PRN
  Administered 2017-01-03: 25 mL via INTRA_ARTERIAL

## 2017-01-03 MED ORDER — FENTANYL CITRATE (PF) 100 MCG/2ML IJ SOLN
INTRAMUSCULAR | Status: AC
  Filled 2017-01-03: qty 2

## 2017-01-03 MED ORDER — SODIUM CHLORIDE 0.9 % IV SOLN: 500.0000 mL | Freq: Once | INTRAVENOUS | Status: DC | PRN

## 2017-01-03 MED ORDER — ALUM & MAG HYDROXIDE-SIMETH 200-200-20 MG/5ML PO SUSP: 15.0000 mL | ORAL | Status: DC | PRN

## 2017-01-03 MED ORDER — FENTANYL CITRATE (PF) 100 MCG/2ML IJ SOLN
INTRAMUSCULAR | Status: DC | PRN
  Administered 2017-01-03: 50 ug via INTRAVENOUS

## 2017-01-03 MED ORDER — LABETALOL HCL 5 MG/ML IV SOLN
10.0000 mg | INTRAVENOUS | Status: DC | PRN
Start: 2017-01-03 — End: 2017-01-03

## 2017-01-03 MED ORDER — HEPARIN (PORCINE) IN NACL 2-0.9 UNIT/ML-% IJ SOLN
INTRAMUSCULAR | Status: AC
  Filled 2017-01-03: qty 1000

## 2017-01-03 MED ORDER — SODIUM CHLORIDE 0.9 % IV SOLN
INTRAVENOUS | Status: DC
  Administered 2017-01-03: 08:00:00 via INTRAVENOUS

## 2017-01-03 MED ORDER — OXYCODONE-ACETAMINOPHEN 5-325 MG PO TABS: 1.0000 | ORAL_TABLET | ORAL | Status: DC | PRN

## 2017-01-03 MED ORDER — HEPARIN SODIUM (PORCINE) 1000 UNIT/ML IJ SOLN
INTRAMUSCULAR | Status: AC
  Filled 2017-01-03: qty 1

## 2017-01-03 MED ORDER — ACETAMINOPHEN 325 MG RE SUPP
325.0000 mg | RECTAL | Status: DC | PRN
  Filled 2017-01-03: qty 2

## 2017-01-03 MED ORDER — HYDRALAZINE HCL 20 MG/ML IJ SOLN: 5.0000 mg | INTRAMUSCULAR | Status: DC | PRN

## 2017-01-03 MED ORDER — PHENOL 1.4 % MT LIQD: 1.0000 | OROMUCOSAL | Status: DC | PRN

## 2017-01-03 MED ORDER — ONDANSETRON HCL 4 MG/2ML IJ SOLN
4.0000 mg | Freq: Four times a day (QID) | INTRAMUSCULAR | Status: DC | PRN
  Administered 2017-01-03: 4 mg via INTRAVENOUS

## 2017-01-03 MED ORDER — GUAIFENESIN-DM 100-10 MG/5ML PO SYRP
15.0000 mL | ORAL_SOLUTION | ORAL | Status: DC | PRN
Start: 2017-01-03 — End: 2017-01-03
  Filled 2017-01-03: qty 15

## 2017-01-03 SURGICAL SUPPLY — 15 items
BALLN ULTRVRSE 5X150X75C (BALLOONS) ×4
BALLN ULTRVRSE 7X200X75 (BALLOONS) ×4
BALLOON ULTRVRSE 5X150X75C (BALLOONS) ×2 IMPLANT
BALLOON ULTRVRSE 7X200X75 (BALLOONS) ×2 IMPLANT
CANNULA 5F STIFF (CANNULA) ×4 IMPLANT
CATH BEACON 5 .035 40 KMP TP (CATHETERS) ×2 IMPLANT
CATH BEACON 5 .038 40 KMP TP (CATHETERS) ×2
DEVICE PRESTO INFLATION (MISCELLANEOUS) ×4 IMPLANT
GLIDEWIRE STIFF .35X180X3 HYDR (WIRE) ×4 IMPLANT
PACK ANGIOGRAPHY (CUSTOM PROCEDURE TRAY) ×4 IMPLANT
SHEATH BRITE TIP 5FRX11 (SHEATH) ×4 IMPLANT
SHEATH BRITE TIP 6FRX5.5 (SHEATH) ×4 IMPLANT
SUT MNCRL AB 4-0 PS2 18 (SUTURE) ×4 IMPLANT
TOWEL OR 17X26 4PK STRL BLUE (TOWEL DISPOSABLE) ×4 IMPLANT
WIRE MAGIC TOR.035 180C (WIRE) ×4 IMPLANT

## 2017-01-03 NOTE — Op Note (Signed)
[place] [facility]    OPERATIVE NOTE   PROCEDURE: 1.   Left radiocephalic arteriovenous fistula cannulation under ultrasound guidance 2.   Left arm fistulagram including central venogram 3.   Percutaneous transluminal angioplasty of left forearm cephalic vein with 5 mm diameter and 7 mm diameter angioplasty balloon  PRE-OPERATIVE DIAGNOSIS: 1. ESRD 2. Poorly functional left radiocephalic AVF  POST-OPERATIVE DIAGNOSIS: same as above   SURGEON: [name], MD  ANESTHESIA: local with MCS  ESTIMATED BLOOD LOSS: Minimal  FINDING(S): 1. 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 There is some disease proximal and distal to this with collateral branches preserving flow. The outflow in the upper arm is largely through the basilic vein in the central venous circulation appeared patent.  SPECIMEN(S):  None  CONTRAST: 25 cc  FLUORO TIME: 3.5 minutes  MODERATE CONSCIOUS SEDATION TIME: Approximately 30 minutes with 2 mg of Versed and 50 mcg of Fentanyl   INDICATIONS: Terry Tyler is a 66 y.o. male who presents with malfunctioning left radiocephalic arteriovenous fistula.  The patient is scheduled for left arm fistulagram.  The patient is aware the risks include but are not limited to: bleeding, infection, thrombosis of the cannulated access, and possible anaphylactic reaction to the contrast.  The patient is aware of the risks of the procedure and elects to proceed forward.  DESCRIPTION: After full informed written consent was obtained, the patient was brought back to the angiography suite and placed supine upon the angiography table.  The patient was connected to monitoring equipment. Moderate conscious sedation was administered with a face to face encounter with the patient throughout the procedure with my supervision of the RN administering medicines and monitoring the patient's vital signs and mental status throughout from the  start of the procedure until the patient was taken to the recovery room. The left arm was prepped and draped in the standard fashion for a percutaneous access intervention.  Under ultrasound guidance, the left radiocephalic arteriovenous fistula was cannulated with a micropuncture needle under direct ultrasound guidance near the anastomosis in an antegrade fashion and a permanent image was performed.  The microwire was advanced into the fistula and the needle was exchanged for the a microsheath.  I then upsized to a 5 Fr Sheath and imaging was performed.  Hand injections were completed to image the access including the central venous system. 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 There is some disease proximal and distal to this with collateral branches preserving flow. The outflow in the upper arm is largely through the basilic vein in the central venous circulation appeared patent..  Based on the images, this patient will need intervention to this area to improve its perfusion. Alternatively, we can try to enlarge the collateral branches that something would need to be done to increase the flow through the fistula. I then gave the patient 4000 units of intravenous heparin.  I then crossed the cephalic vein occlusion with a Kumpe catheter and a glide wire. I confirmed intraluminal flow through the Kumpe catheter at the elbow. I then replaced a Magic torque wire  Based on the imaging, a 5 mm x 15 cm  angioplasty balloon was selected.  The balloon was centered around the forearm cephalic vein occlusion as well as the proximal and distal stenosis and inflated to 14 ATM for 1 minute(s).  On completion imaging, a greater than 50 % residual stenosis  was present.   I then upsized to a 7 mm diameter by 20 cm length angioplasty balloon to treat almost the entire forearm cephalic vein to increase in size. This was inflated to 12 atm for 1 minute.  Completion angiogram showed irregularity of the forearm cephalic vein but no greater than 30% residual stenosis was seen. There was a good thrill palpable in the fistula.  Based on the completion imaging, no further intervention is necessary.  The wire and balloon were removed from the sheath.  A 4-0 Monocryl purse-string suture was sewn around the sheath.  The sheath was removed while tying down the suture.  A sterile bandage was applied to the puncture site.  COMPLICATIONS: None  CONDITION: Stable   [name]  01/03/2017 9:16 AM   This note was created with Dragon Medical transcription system. Any errors in dictation are purely unintentional.

## 2017-01-03 NOTE — H&P (Signed)
[facility] [facility] History & Physical Update  The patient was interviewed and re-examined.  The patient's previous History and Physical has been reviewed and is unchanged.  There is no change in the plan of care. We plan to proceed with the scheduled procedure.  [name], MD  01/03/2017, 8:13 AM

## 2017-01-04 ENCOUNTER — Encounter: Payer: Self-pay | Admitting: Vascular Surgery

## 2017-02-08 ENCOUNTER — Encounter (INDEPENDENT_AMBULATORY_CARE_PROVIDER_SITE_OTHER): Payer: Self-pay

## 2017-02-13 ENCOUNTER — Other Ambulatory Visit (INDEPENDENT_AMBULATORY_CARE_PROVIDER_SITE_OTHER): Payer: Self-pay

## 2017-02-14 ENCOUNTER — Encounter
Admission: RE | Disposition: A | Discharge status: Home / Self Care | Payer: Self-pay | Source: Ambulatory Visit | Attending: Vascular Surgery

## 2017-02-14 ENCOUNTER — Ambulatory Visit
Admission: RE | Admit: 2017-02-14 | Discharge: 2017-02-14 | Disposition: A | Discharge status: Home / Self Care | Payer: Medicare HMO | Source: Ambulatory Visit | Attending: Vascular Surgery | Admitting: Vascular Surgery

## 2017-02-14 DIAGNOSIS — Z9889 Other specified postprocedural states: Secondary | ICD-10-CM | POA: Insufficient documentation

## 2017-02-14 DIAGNOSIS — I252 Old myocardial infarction: Secondary | ICD-10-CM | POA: Diagnosis not present

## 2017-02-14 DIAGNOSIS — Z8603 Personal history of neoplasm of uncertain behavior: Secondary | ICD-10-CM | POA: Insufficient documentation

## 2017-02-14 DIAGNOSIS — I25810 Atherosclerosis of coronary artery bypass graft(s) without angina pectoris: Secondary | ICD-10-CM | POA: Diagnosis not present

## 2017-02-14 DIAGNOSIS — Z888 Allergy status to other drugs, medicaments and biological substances status: Secondary | ICD-10-CM | POA: Diagnosis not present

## 2017-02-14 DIAGNOSIS — E785 Hyperlipidemia, unspecified: Secondary | ICD-10-CM | POA: Insufficient documentation

## 2017-02-14 DIAGNOSIS — E1122 Type 2 diabetes mellitus with diabetic chronic kidney disease: Secondary | ICD-10-CM | POA: Diagnosis not present

## 2017-02-14 DIAGNOSIS — Z8673 Personal history of transient ischemic attack (TIA), and cerebral infarction without residual deficits: Secondary | ICD-10-CM | POA: Insufficient documentation

## 2017-02-14 DIAGNOSIS — N186 End stage renal disease: Secondary | ICD-10-CM | POA: Insufficient documentation

## 2017-02-14 DIAGNOSIS — Z452 Encounter for adjustment and management of vascular access device: Secondary | ICD-10-CM | POA: Insufficient documentation

## 2017-02-14 DIAGNOSIS — E1151 Type 2 diabetes mellitus with diabetic peripheral angiopathy without gangrene: Secondary | ICD-10-CM | POA: Insufficient documentation

## 2017-02-14 DIAGNOSIS — Z955 Presence of coronary angioplasty implant and graft: Secondary | ICD-10-CM | POA: Diagnosis not present

## 2017-02-14 DIAGNOSIS — I120 Hypertensive chronic kidney disease with stage 5 chronic kidney disease or end stage renal disease: Secondary | ICD-10-CM | POA: Diagnosis not present

## 2017-02-14 DIAGNOSIS — E119 Type 2 diabetes mellitus without complications: Secondary | ICD-10-CM | POA: Diagnosis not present

## 2017-02-14 DIAGNOSIS — Z8249 Family history of ischemic heart disease and other diseases of the circulatory system: Secondary | ICD-10-CM | POA: Diagnosis not present

## 2017-02-14 DIAGNOSIS — E559 Vitamin D deficiency, unspecified: Secondary | ICD-10-CM | POA: Diagnosis not present

## 2017-02-14 DIAGNOSIS — Z951 Presence of aortocoronary bypass graft: Secondary | ICD-10-CM | POA: Insufficient documentation

## 2017-02-14 DIAGNOSIS — Z992 Dependence on renal dialysis: Secondary | ICD-10-CM | POA: Insufficient documentation

## 2017-02-14 DIAGNOSIS — Z87891 Personal history of nicotine dependence: Secondary | ICD-10-CM | POA: Insufficient documentation

## 2017-02-14 DIAGNOSIS — I10 Essential (primary) hypertension: Secondary | ICD-10-CM | POA: Diagnosis not present

## 2017-02-14 HISTORY — PX: DIALYSIS/PERMA CATHETER REMOVAL: CATH118289

## 2017-02-14 SURGERY — DIALYSIS/PERMA CATHETER REMOVAL
Anesthesia: LOCAL

## 2017-02-14 MED ORDER — LIDOCAINE-EPINEPHRINE (PF) 2 %-1:200000 IJ SOLN
INTRAMUSCULAR | Status: AC
  Filled 2017-02-14: qty 20

## 2017-02-14 MED ORDER — LIDOCAINE-EPINEPHRINE (PF) 1 %-1:200000 IJ SOLN
INTRAMUSCULAR | Status: DC | PRN
  Administered 2017-02-14: 20 mL via INTRADERMAL

## 2017-02-14 SURGICAL SUPPLY — 4 items
FCP FG STRG 5.5XNS LF DISP (INSTRUMENTS) ×1
FORCEPS FG STRG 5.5XNS LF DISP (INSTRUMENTS) ×1 IMPLANT
FORCEPS KELLY 5.5 STR (INSTRUMENTS) ×2
TRAY LACERAT/PLASTIC (MISCELLANEOUS) ×2 IMPLANT

## 2017-02-14 NOTE — Discharge Instructions (Signed)
Tunneled Catheter Removal, Care After °Refer to this sheet in the next few weeks. These instructions provide you with information about caring for yourself after your procedure. Your health care provider may also give you more specific instructions. Your treatment has been planned according to current medical practices, but problems sometimes occur. Call your health care provider if you have any problems or questions after your procedure. °What can I expect after the procedure? °After the procedure, it is common to have: °· Some mild redness, swelling, and pain around your catheter site. ° ° °Follow these instructions at home: °Incision care  °· Check your removal site  every day for signs of infection. Check for: °¨ More redness, swelling, or pain. °¨ More fluid or blood. °¨ Warmth. °¨ Pus or a bad smell. °· Follow instructions from your health care provider about how to take care of your removal site. Make sure you: °¨ Wash your hands with soap and water before you change your bandages (dressings). If soap and water are not available, use hand sanitizer. °Activity  °· Return to your normal activities as told by your health care provider. Ask your health care provider what activities are safe for you. °· Do not lift anything that is heavier than 10 lb (4.5 kg) for 3 weeks or as long as told by your health care provider. ° °Contact a health care provider if: °· You have more fluid or blood coming from your removal site °· You have more redness, swelling, or pain at your incisions or around the area where your catheter was removed °· Your removal site feel warm to the touch. °· You feel unusually weak. °· You feel nauseous.. °· Get help right away if °· You have swelling in your arm, shoulder, neck, or face. °· You develop chest pain. °· You have difficulty breathing. °· You feel dizzy or light-headed. °· You have pus or a bad smell coming from your removal site °· You have a fever. °· You develop bleeding from your  removal site, and your bleeding does not stop. °This information is not intended to replace advice given to you by your health care provider. Make sure you discuss any questions you have with your health care provider. °Document Released: 05/14/2012 Document Revised: 01/29/2016 Document Reviewed: 02/21/2015 °Elsevier Interactive Patient Education © 2017 Elsevier Inc[employer] ° °

## 2017-02-14 NOTE — Op Note (Signed)
Operative Note     Preoperative diagnosis:   1. ESRD with functional permanent access  Postoperative diagnosis:  1. ESRD with functional permanent access  Procedure:  Removal of right jugular Permcath  Surgeon:  [name], MD  Anesthesia:  Local  EBL:  Minimal  Indication for the Procedure:  The patient has a functional permanent dialysis access and no longer needs their permcath.  This can be removed.  Risks and benefits are discussed and informed consent is obtained.  Description of the Procedure:  The patient's right neck, chest and existing catheter were sterilely prepped and draped. The area around the catheter was anesthetized copiously with 1% lidocaine. The catheter was dissected out with curved hemostats until the cuff was freed from the surrounding fibrous sheath. The fiber sheath was transected, and the catheter was then removed in its entirety using gentle traction. Pressure was held and sterile dressings were placed. The patient tolerated the procedure well and was taken to the recovery room in stable condition.     [name]  02/14/2017, 10:51 AM This note was created with Dragon Medical transcription system. Any errors in dictation are purely unintentional.

## 2017-02-14 NOTE — H&P (Signed)
[facility] Admission History & Physical  MRN : [id number]  Terry Tyler is a 66 y.o. (May 25, 1951) male who presents with chief complaint of No chief complaint on file. Marland Kitchen  History of Present Illness: I am asked to evaluate the patient by the dialysis center. The patient was sent here because they have a nonfunctioning tunneled catheter and a functioning access.  The patient reports they're not been any problems with any of their dialysis runs. They are reporting good flows with good parameters at dialysis.  Patient denies pain or tenderness overlying the access.  There is no pain with dialysis.  The patient denies hand pain or finger pain consistent with steal syndrome.  No fevers or chills while on dialysis.   No current facility-administered medications for this encounter.     Past Medical History:  Diagnosis Date  . CKD (chronic kidney disease)   . Coronary atherosclerosis of unspecified type of vessel, native or graft    Myocardial infarction in 2006. Cardiac catheterization showed significant three-vessel coronary artery disease. He underwent CABG at [facility]. Most recent cardiac catheterization in 2010 showed normal LV systolic function, patent grafts to OM1, RCA and LAD. Occluded SVG to diagonal.  . 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 Dialysis patient [facility])    Mon. Wed. Fri. for dialysis  . Hyperlipidemia   . Hypertension   . Hypertension, renal disease   . Impotence of organic origin   . Keloid scar   . Myocardial infarction ([place]) 2006  . Neoplasm of uncertain behavior, site unspecified   . Peripheral vascular disease, unspecified ([place])   . Proteinuria   . Stroke [facility]) 04/2016   TIA  . Unspecified vitamin D deficiency     Past Surgical History:  Procedure Laterality Date  . A/V FISTULAGRAM Left 01/03/2017   Procedure: A/V Fistulagram;  Surgeon: Algernon Huxley, MD;  Location: [place] [facility];  Service:  Cardiovascular;  Laterality: Left;  . A/V SHUNT INTERVENTION N/A 01/03/2017   Procedure: A/V Shunt Intervention;  Surgeon: Algernon Huxley, MD;  Location: [place] [facility];  Service: Cardiovascular;  Laterality: N/A;  . AV FISTULA PLACEMENT Left 10/25/2016   Procedure: ARTERIOVENOUS (AV) FISTULA CREATION ( RADIOCEPHALIC );  Surgeon: Algernon Huxley, MD;  Location: [facility];  Service: Vascular;  Laterality: Left;  . CARDIAC CATHETERIZATION  12/2004   [facility]  . CARDIAC CATHETERIZATION  09/2008   [facility]  . CORONARY ANGIOPLASTY    . CORONARY ARTERY BYPASS GRAFT  2006   [facility]  . CORONARY STENT INTERVENTION N/A 09/10/2016   Procedure: Coronary Stent Intervention;  Surgeon: Isaias Cowman, MD;  Location: [place] [facility];  Service: Cardiovascular;  Laterality: N/A;  . keloid removal     right neck  . LEFT HEART CATH AND CORONARY ANGIOGRAPHY N/A 09/10/2016   Procedure: Left Heart Cath and Coronary Angiography;  Surgeon: Isaias Cowman, MD;  Location: [place] [facility];  Service: Cardiovascular;  Laterality: N/A;    Social History Social History  Substance Use Topics  . Smoking status: Former Smoker    Packs/day: 0.50    Years: 0.00    Types: Cigarettes    Quit date: 06/11/1986  . Smokeless tobacco: Never Used  . Alcohol use No     Comment: socially    Family History Family History  Problem Relation Age of Onset  . Hypertension Mother   . Hyperlipidemia Mother   . Heart disease  Mother   . Heart disease Father   . Hyperlipidemia Father   . Hypertension Father     No family history of bleeding or clotting disorders, autoimmune disease or porphyria  Allergies  Allergen Reactions  . Ezetimibe Other (See Comments)    Cannot remember what the allergy was.  . Lipitor [Atorvastatin] Other (See Comments)    Muscle cramping     REVIEW OF SYSTEMS (Negative unless checked)  Constitutional: [] Weight loss  [] Fever  [] Chills Cardiac: [] Chest pain   [] Chest pressure    [] Palpitations   [] Shortness of breath when laying flat   [] Shortness of breath at rest   [x] Shortness of breath with exertion. Vascular:  [] Pain in legs with walking   [] Pain in legs at rest   [] Pain in legs when laying flat   [] Claudication   [] Pain in feet when walking  [] Pain in feet at rest  [] Pain in feet when laying flat   [] History of DVT   [] Phlebitis   [] Swelling in legs   [] Varicose veins   [] Non-healing ulcers Pulmonary:   [] Uses home oxygen   [] Productive cough   [] Hemoptysis   [] Wheeze  [] COPD   [] Asthma Neurologic:  [] Dizziness  [] Blackouts   [] Seizures   [] History of stroke   [] History of TIA  [] Aphasia   [] Temporary blindness   [] Dysphagia   [] Weakness or numbness in arms   [] Weakness or numbness in legs Musculoskeletal:  [x] Arthritis   [] Joint swelling   [] Joint pain   [] Low back pain Hematologic:  [] Easy bruising  [] Easy bleeding   [] Hypercoagulable state   [] Anemic  [] Hepatitis Gastrointestinal:  [] Blood in stool   [] Vomiting blood  [] Gastroesophageal reflux/heartburn   [] Difficulty swallowing. Genitourinary:  [x] Chronic kidney disease   [] Difficult urination  [] Frequent urination  [] Burning with urination   [] Blood in urine Skin:  [] Rashes   [] Ulcers   [] Wounds Psychological:  [] History of anxiety   []  History of major depression.  Physical Examination  Vitals:   02/14/17 0932  BP: (!) 152/93  Pulse: 96  Resp: 18  Temp: 98.8 F (37.1 C)  TempSrc: Oral  SpO2: 96%   There is no height or weight on file to calculate BMI. Gen: WD/WN, NAD Head: [place]/AT, No temporalis wasting. Prominent temp pulse not noted. Ear/Nose/Throat: Hearing grossly intact, nares w/o erythema or drainage, oropharynx w/o Erythema/Exudate,  Eyes: Conjunctiva clear, sclera non-icteric Neck: Trachea midline.  No JVD.  Pulmonary:  Good air movement, respirations not labored, no use of accessory muscles.  Cardiac: RRR, normal S1, S2. Vascular: good thrill in the access Vessel Right Left  Radial  Palpable Palpable  Ulnar Not Palpable Not Palpable  Brachial Palpable Palpable  Carotid Palpable, without bruit Palpable, without bruit  Gastrointestinal: soft, non-tender/non-distended. No guarding/reflex.  Musculoskeletal: M/S 5/5 throughout.  Extremities without ischemic changes.  No deformity or atrophy.  Neurologic: Sensation grossly intact in extremities.  Symmetrical.  Speech is fluent. Motor exam as listed above. Psychiatric: Judgment intact, Mood & affect appropriate for pt's clinical situation. Dermatologic: No rashes or ulcers noted.  No cellulitis or open wounds. Lymph : No Cervical, Axillary, or Inguinal lymphadenopathy.   CBC Lab Results  Component Value Date   WBC 12.7 (H) 12/25/2016   HGB 11.4 (L) 12/25/2016   HCT 34.5 (L) 12/25/2016   MCV 77.8 (L) 12/25/2016   PLT 271 12/25/2016    BMET    Component Value Date/Time   NA 137 12/25/2016 1105   K 2.9 (L) 12/25/2016 1105  CL 97 (L) 12/25/2016 1105   CO2 27 12/25/2016 1105   GLUCOSE 179 (H) 12/25/2016 1105   BUN 16 12/25/2016 1105   CREATININE 5.54 (H) 12/25/2016 1105   CALCIUM 9.5 12/25/2016 1105   GFRNONAA 10 (L) 12/25/2016 1105   GFRAA 11 (L) 12/25/2016 1105   CrCl cannot be calculated (Patient's most recent lab result is older than the maximum 21 days allowed.).  COAG Lab Results  Component Value Date   INR 1.10 10/18/2016   INR 1.02 04/09/2016    Radiology No results found.  Assessment/Plan 1.  Complication dialysis device with thrombosis AV access:  Patient's Tunneled catheter is not being used. The patient has an extremity access that is functioning well. Therefore, the patient will undergo removal of the tunneled catheter under local anesthesia.  The risks and benefits were described to the patient.  All questions were answered.  The patient agrees to proceed with angiography and intervention. Potassium will be drawn to ensure that it is an appropriate level prior to performing intervention. 2.   End-stage renal disease requiring hemodialysis:  Patient will continue dialysis therapy without further interruption if a successful intervention is not achieved then a tunneled catheter will be placed. Dialysis has already been arranged. 3.  Hypertension:  Patient will continue medical management; nephrology is following no changes in oral medications. 4. Diabetes mellitus:  Glucose will be monitored and oral medications been held this morning once the patient has undergone the patient's procedure po intake will be reinitiated and again Accu-Cheks will be used to assess the blood glucose level and treat as needed. The patient will be restarted on the patient's usual hypoglycemic regime     [name], MD  02/14/2017 10:27 AM

## 2017-02-15 ENCOUNTER — Encounter: Payer: Self-pay | Admitting: Vascular Surgery

## 2017-02-15 ENCOUNTER — Other Ambulatory Visit (INDEPENDENT_AMBULATORY_CARE_PROVIDER_SITE_OTHER): Payer: Self-pay | Admitting: Vascular Surgery

## 2017-02-15 DIAGNOSIS — T82590A Other mechanical complication of surgically created arteriovenous fistula, initial encounter: Secondary | ICD-10-CM

## 2017-02-19 ENCOUNTER — Ambulatory Visit (INDEPENDENT_AMBULATORY_CARE_PROVIDER_SITE_OTHER): Payer: Medicare HMO | Admitting: Vascular Surgery

## 2017-02-19 ENCOUNTER — Encounter (INDEPENDENT_AMBULATORY_CARE_PROVIDER_SITE_OTHER): Payer: Medicare HMO

## 2017-02-19 ENCOUNTER — Observation Stay
Admission: AD | Admit: 2017-02-19 | Discharge: 2017-02-21 | Disposition: A | Discharge status: Home / Self Care | Payer: Medicare HMO | Source: Ambulatory Visit | Attending: Specialist | Admitting: Specialist

## 2017-02-19 ENCOUNTER — Encounter: Payer: Self-pay | Admitting: Internal Medicine

## 2017-02-19 DIAGNOSIS — Z7902 Long term (current) use of antithrombotics/antiplatelets: Secondary | ICD-10-CM | POA: Insufficient documentation

## 2017-02-19 DIAGNOSIS — Z992 Dependence on renal dialysis: Secondary | ICD-10-CM | POA: Insufficient documentation

## 2017-02-19 DIAGNOSIS — Z888 Allergy status to other drugs, medicaments and biological substances status: Secondary | ICD-10-CM | POA: Diagnosis not present

## 2017-02-19 DIAGNOSIS — Z7982 Long term (current) use of aspirin: Secondary | ICD-10-CM | POA: Insufficient documentation

## 2017-02-19 DIAGNOSIS — E1122 Type 2 diabetes mellitus with diabetic chronic kidney disease: Secondary | ICD-10-CM

## 2017-02-19 DIAGNOSIS — A0472 Enterocolitis due to Clostridium difficile, not specified as recurrent: Secondary | ICD-10-CM

## 2017-02-19 DIAGNOSIS — Z794 Long term (current) use of insulin: Secondary | ICD-10-CM | POA: Insufficient documentation

## 2017-02-19 DIAGNOSIS — N186 End stage renal disease: Secondary | ICD-10-CM | POA: Insufficient documentation

## 2017-02-19 DIAGNOSIS — Z951 Presence of aortocoronary bypass graft: Secondary | ICD-10-CM | POA: Diagnosis not present

## 2017-02-19 DIAGNOSIS — R197 Diarrhea, unspecified: Principal | ICD-10-CM | POA: Insufficient documentation

## 2017-02-19 DIAGNOSIS — I252 Old myocardial infarction: Secondary | ICD-10-CM | POA: Insufficient documentation

## 2017-02-19 DIAGNOSIS — E785 Hyperlipidemia, unspecified: Secondary | ICD-10-CM | POA: Diagnosis not present

## 2017-02-19 DIAGNOSIS — N2581 Secondary hyperparathyroidism of renal origin: Secondary | ICD-10-CM | POA: Insufficient documentation

## 2017-02-19 DIAGNOSIS — Z79899 Other long term (current) drug therapy: Secondary | ICD-10-CM | POA: Insufficient documentation

## 2017-02-19 DIAGNOSIS — Z8673 Personal history of transient ischemic attack (TIA), and cerebral infarction without residual deficits: Secondary | ICD-10-CM | POA: Diagnosis not present

## 2017-02-19 DIAGNOSIS — Z87891 Personal history of nicotine dependence: Secondary | ICD-10-CM | POA: Diagnosis not present

## 2017-02-19 DIAGNOSIS — E1151 Type 2 diabetes mellitus with diabetic peripheral angiopathy without gangrene: Secondary | ICD-10-CM | POA: Diagnosis not present

## 2017-02-19 DIAGNOSIS — I120 Hypertensive chronic kidney disease with stage 5 chronic kidney disease or end stage renal disease: Secondary | ICD-10-CM | POA: Diagnosis not present

## 2017-02-19 DIAGNOSIS — R05 Cough: Secondary | ICD-10-CM | POA: Insufficient documentation

## 2017-02-19 DIAGNOSIS — R059 Cough, unspecified: Secondary | ICD-10-CM

## 2017-02-19 DIAGNOSIS — I2510 Atherosclerotic heart disease of native coronary artery without angina pectoris: Secondary | ICD-10-CM | POA: Diagnosis not present

## 2017-02-19 HISTORY — DX: Enterocolitis due to Clostridium difficile, not specified as recurrent: A04.72

## 2017-02-19 LAB — CBC
HCT: 28.6 % — ABNORMAL LOW (ref 40.0–52.0)
Hemoglobin: 9.7 g/dL — ABNORMAL LOW (ref 13.0–18.0)
MCH: 26.8 pg (ref 26.0–34.0)
MCHC: 33.8 g/dL (ref 32.0–36.0)
MCV: 79.4 fL — ABNORMAL LOW (ref 80.0–100.0)
PLATELETS: 116 10*3/uL — AB (ref 150–440)
RBC: 3.61 MIL/uL — ABNORMAL LOW (ref 4.40–5.90)
RDW: 18.8 % — AB (ref 11.5–14.5)
WBC: 7.4 10*3/uL (ref 3.8–10.6)

## 2017-02-19 LAB — COMPREHENSIVE METABOLIC PANEL
ALBUMIN: 2.9 g/dL — AB (ref 3.5–5.0)
ALT: 41 U/L (ref 17–63)
AST: 61 U/L — AB (ref 15–41)
Alkaline Phosphatase: 548 U/L — ABNORMAL HIGH (ref 38–126)
Anion gap: 13 (ref 5–15)
BUN: 19 mg/dL (ref 6–20)
CHLORIDE: 97 mmol/L — AB (ref 101–111)
CO2: 26 mmol/L (ref 22–32)
Calcium: 9.2 mg/dL (ref 8.9–10.3)
Creatinine, Ser: 4.4 mg/dL — ABNORMAL HIGH (ref 0.61–1.24)
GFR calc Af Amer: 15 mL/min — ABNORMAL LOW (ref >60)
GFR calc non Af Amer: 13 mL/min — ABNORMAL LOW (ref >60)
GLUCOSE: 139 mg/dL — AB (ref 65–99)
Potassium: 3.5 mmol/L (ref 3.5–5.1)
Sodium: 136 mmol/L (ref 135–145)
Total Bilirubin: 4.9 mg/dL — ABNORMAL HIGH (ref 0.3–1.2)
Total Protein: 7.5 g/dL (ref 6.5–8.1)

## 2017-02-19 LAB — GLUCOSE, CAPILLARY
Glucose-Capillary: 140 mg/dL — ABNORMAL HIGH (ref 65–99)
Glucose-Capillary: 152 mg/dL — ABNORMAL HIGH (ref 65–99)

## 2017-02-19 MED ORDER — EPOETIN ALFA 10000 UNIT/ML IJ SOLN
4000.0000 [IU] | INTRAMUSCULAR | Status: DC
  Administered 2017-02-20: 4000 [IU] via INTRAVENOUS

## 2017-02-19 MED ORDER — CINACALCET HCL 30 MG PO TABS
30.0000 mg | ORAL_TABLET | Freq: Every day | ORAL | Status: DC
  Administered 2017-02-20 – 2017-02-21 (×2): 30 mg via ORAL
  Filled 2017-02-19 (×3): qty 1

## 2017-02-19 MED ORDER — DOCUSATE SODIUM 100 MG PO CAPS: 100.0000 mg | ORAL_CAPSULE | Freq: Two times a day (BID) | ORAL | Status: DC | PRN

## 2017-02-19 MED ORDER — SODIUM CHLORIDE 0.9 % IV SOLN: INTRAVENOUS | Status: DC

## 2017-02-19 MED ORDER — ROSUVASTATIN CALCIUM 10 MG PO TABS
20.0000 mg | ORAL_TABLET | Freq: Every day | ORAL | Status: DC
  Administered 2017-02-20: 20 mg via ORAL
  Filled 2017-02-19 (×2): qty 2

## 2017-02-19 MED ORDER — INSULIN ASPART 100 UNIT/ML ~~LOC~~ SOLN
0.0000 [IU] | Freq: Three times a day (TID) | SUBCUTANEOUS | Status: DC
  Administered 2017-02-19: 1 [IU] via SUBCUTANEOUS
  Administered 2017-02-20: 2 [IU] via SUBCUTANEOUS
  Administered 2017-02-20: 1 [IU] via SUBCUTANEOUS
  Administered 2017-02-21: 2 [IU] via SUBCUTANEOUS
  Administered 2017-02-21: 1 [IU] via SUBCUTANEOUS
  Filled 2017-02-19 (×5): qty 1

## 2017-02-19 MED ORDER — VANCOMYCIN 50 MG/ML ORAL SOLUTION
125.0000 mg | Freq: Four times a day (QID) | ORAL | Status: DC
  Administered 2017-02-19 – 2017-02-20 (×3): 125 mg via ORAL
  Filled 2017-02-19 (×6): qty 2.5

## 2017-02-19 MED ORDER — GABAPENTIN 300 MG PO CAPS
300.0000 mg | ORAL_CAPSULE | Freq: Three times a day (TID) | ORAL | Status: DC
  Administered 2017-02-19 – 2017-02-21 (×6): 300 mg via ORAL
  Filled 2017-02-19 (×6): qty 1

## 2017-02-19 MED ORDER — ONDANSETRON HCL 4 MG/2ML IJ SOLN
4.0000 mg | Freq: Four times a day (QID) | INTRAMUSCULAR | Status: DC | PRN
  Administered 2017-02-19: 4 mg via INTRAVENOUS
  Filled 2017-02-19: qty 2

## 2017-02-19 MED ORDER — CLOPIDOGREL BISULFATE 75 MG PO TABS
75.0000 mg | ORAL_TABLET | Freq: Every day | ORAL | Status: DC
  Administered 2017-02-20 – 2017-02-21 (×2): 75 mg via ORAL
  Filled 2017-02-19 (×3): qty 1

## 2017-02-19 MED ORDER — METRONIDAZOLE IN NACL 5-0.79 MG/ML-% IV SOLN
500.0000 mg | Freq: Three times a day (TID) | INTRAVENOUS | Status: DC
  Administered 2017-02-19 – 2017-02-20 (×3): 500 mg via INTRAVENOUS
  Filled 2017-02-19 (×5): qty 100

## 2017-02-19 MED ORDER — METOPROLOL SUCCINATE ER 25 MG PO TB24
25.0000 mg | ORAL_TABLET | Freq: Every evening | ORAL | Status: DC
  Administered 2017-02-19 – 2017-02-20 (×2): 25 mg via ORAL
  Filled 2017-02-19 (×2): qty 1

## 2017-02-19 MED ORDER — HEPARIN SODIUM (PORCINE) 5000 UNIT/ML IJ SOLN
5000.0000 [IU] | Freq: Three times a day (TID) | INTRAMUSCULAR | Status: DC
  Administered 2017-02-19 – 2017-02-21 (×5): 5000 [IU] via SUBCUTANEOUS
  Filled 2017-02-19 (×6): qty 1

## 2017-02-19 MED ORDER — ASPIRIN EC 81 MG PO TBEC
81.0000 mg | DELAYED_RELEASE_TABLET | Freq: Every evening | ORAL | Status: DC
  Administered 2017-02-20: 81 mg via ORAL
  Filled 2017-02-19 (×2): qty 1

## 2017-02-19 NOTE — Progress Notes (Signed)
[place], [place] 02/19/17  Subjective:   66 y/o male with ESRD on dialysis MWF, CAD, MI (2006 w/ CABG), TIA (2017),T1DM, hyperlipidemia, HTN, and peripheral vascular dz.   Pt presented to the ED via PCP at [facility] for weight loss and diarrhea x 2 months. States that he will have diarrhea with all foods and even liquids like water. Reports not having perfect compliance with his outpatient vancomycin. Reports occasional vomiting. Denies blood in the stool and abdominal pain.   Nephrology consult for maintenance of dialysis. [facility] [address]. HD MWF. 4 hr 15 min.   Objective:  Vital signs in last 24 hours:  Weight:  [97.1 kg (214 lb)] 97.1 kg (214 lb) (09/11 1530)  Weight change:  Filed Weights   02/19/17 1530  Weight: 97.1 kg (214 lb)    Intake/Output:   No intake or output data in the 24 hours ending 02/19/17 1538   Physical Exam: General: Pt is lying down watching TV in NAD  HEENT Moist oral mucous membranes, pale conjunctiva  Neck No venous distention  Pulm/lungs Clear to ausculation bilaterally  CVS/Heart Regular rate and rhythym  Abdomen:  Soft and non-tender to palpation  Extremities:  dependent lower extremity swelling  Neurologic: Alert and talking  Skin: No rashes present  Access: Left forearm fistula       Basic Metabolic Panel:  No results for input(s): NA, K, CL, CO2, GLUCOSE, BUN, CREATININE, CALCIUM, MG, PHOS in the last 168 hours.   CBC: No results for input(s): WBC, NEUTROABS, HGB, HCT, MCV, PLT in the last 168 hours.   No results found for: HEPBSAG, HEPBSAB, HEPBIGM    Microbiology:  No results found for this or any previous visit (from the past 240 hour(s)).  Coagulation Studies: No results for input(s): LABPROT, INR in the last 72 hours.  Urinalysis: No results for input(s): COLORURINE, LABSPEC, PHURINE, GLUCOSEU, HGBUR, BILIRUBINUR, KETONESUR, PROTEINUR, UROBILINOGEN, NITRITE, LEUKOCYTESUR in the last  72 hours.  Invalid input(s): APPERANCEUR    Imaging: No results found.   Medications:   . metronidazole 500 mg (02/19/17 1527)   . aspirin EC  81 mg Oral QPM  . cinacalcet  30 mg Oral Q breakfast  . clopidogrel  75 mg Oral Daily  . gabapentin  300 mg Oral TID  . heparin  5,000 Units Subcutaneous Q8H  . metoprolol succinate  25 mg Oral QPM  . rosuvastatin  20 mg Oral QHS  . vancomycin  125 mg Oral Q6H     Assessment/ Plan:  66 y.o. 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   ESRD - Dialysis scheduled for tomorrow. Normal schedule MWF for 4 hours and 15 min.  - Labs to be drawn tomorrow with HD  Anemia of CKD - EPO to be given at HD tomorrow  Secondary Hyperparathyroidism - Will monitor and order phosphorus tomorrow.  - Continue cinacalcet  Cdiff Colitis - treatment as primary team.     LOS: 0 Donnalyn Juran 9/11/20183:38 PM  [facility] [place], [place]

## 2017-02-19 NOTE — Consult Note (Signed)
Jonathon Bellows MD, MRCP(U.K) [address]  [place]  [place], [place] [address]  Main: [phone]  Fax: [phone]  Consultation  Referring Provider:     Dr Anselm Jungling  Primary Care Physician:  Marguerita Merles, MD Primary Gastroenterologist:  [facility]  Reason for Consultation:     Recurrent C diff diarrhea   Date of Admission:  02/19/2017 Date of Consultation:  02/19/2017         HPI:   Terry Tyler is a 66 y.o. male in the hospital for non functioning tunneled catheter . I have been asked to see him for recurrent C diff. Been on 2 courses of vancomycin over the past few months, says still on vancomycin with diarrhea till yesterday but today has formed stool per nurses. Managed by [facility] clinic. Tested positive for C diff in 11/17/16 , 12/24/16.     Past Medical History:  Diagnosis Date  . C. difficile diarrhea   . CKD (chronic kidney disease)   . Coronary atherosclerosis of unspecified type of vessel, native or graft    Myocardial infarction in 2006. Cardiac catheterization showed significant three-vessel coronary artery disease. He underwent CABG at [facility]. Most recent cardiac catheterization in 2010 showed normal LV systolic function, patent grafts to OM1, RCA and LAD. Occluded SVG to diagonal.  . 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 Dialysis patient [facility])    Mon. Wed. Fri. for dialysis  . Hyperlipidemia   . Hypertension   . Hypertension, renal disease   . Impotence of organic origin   . Keloid scar   . Myocardial infarction ([place]) 2006  . Neoplasm of uncertain behavior, site unspecified   . Peripheral vascular disease, unspecified ([place])   . Proteinuria   . Stroke [facility]) 04/2016   TIA  . Unspecified vitamin D deficiency     Past Surgical History:  Procedure Laterality Date  . A/V FISTULAGRAM Left 01/03/2017   Procedure: A/V Fistulagram;  Surgeon: Algernon Huxley, MD;  Location: [place] [facility];  Service: Cardiovascular;   Laterality: Left;  . A/V SHUNT INTERVENTION N/A 01/03/2017   Procedure: A/V Shunt Intervention;  Surgeon: Algernon Huxley, MD;  Location: [place] [facility];  Service: Cardiovascular;  Laterality: N/A;  . AV FISTULA PLACEMENT Left 10/25/2016   Procedure: ARTERIOVENOUS (AV) FISTULA CREATION ( RADIOCEPHALIC );  Surgeon: Algernon Huxley, MD;  Location: [facility] OR[facility];  Service: Vascular;  Laterality: Left;  . CARDIAC CATHETERIZATION  12/2004   [facility]  . CARDIAC CATHETERIZATION  09/2008   [facility]  . CORONARY ANGIOPLASTY    . CORONARY ARTERY BYPASS GRAFT  2006   [facility]  . CORONARY STENT INTERVENTION N/A 09/10/2016   Procedure: Coronary Stent Intervention;  Surgeon: Isaias Cowman, MD;  Location: [place] [facility];  Service: Cardiovascular;  Laterality: N/A;  . DIALYSIS/PERMA CATHETER REMOVAL N/A 02/14/2017   Procedure: DIALYSIS/PERMA CATHETER REMOVAL;  Surgeon: Algernon Huxley, MD;  Location: [place] [facility];  Service: Cardiovascular;  Laterality: N/A;  . keloid removal     right neck  . LEFT HEART CATH AND CORONARY ANGIOGRAPHY N/A 09/10/2016   Procedure: Left Heart Cath and Coronary Angiography;  Surgeon: Isaias Cowman, MD;  Location: [place] [facility];  Service: Cardiovascular;  Laterality: N/A;    Prior to Admission medications   Medication Sig Start Date End Date Taking? Authorizing Provider  aspirin EC 81 MG tablet Take 81 mg by mouth every evening.  [provider]  cinacalcet (SENSIPAR) 30 MG tablet Take 30 mg by mouth daily.  08/24/16   [provider]  clopidogrel (PLAVIX) 75 MG tablet Take 1 tablet (75 mg total) by mouth daily. Patient taking differently: Take 75 mg by mouth every evening.  04/11/16   Fritzi Mandes, MD  furosemide (LASIX) 40 MG tablet Take 40 mg by mouth 2 (two) times daily.    [provider]  gabapentin (NEURONTIN) 300 MG capsule Take 300 mg by mouth 3 (three) times daily.    [provider]  hydrALAZINE (APRESOLINE) 50  MG tablet Take 50 mg by mouth 2 (two) times daily.    [provider]  insulin glargine (LANTUS) 100 UNIT/ML injection Inject 0.25 mLs (25 Units total) into the skin at bedtime. Takes 80 units at bedtimed;Titrate 4 units every 4 days unitl FBG are in the 130s. Patient taking differently: Inject 30 Units into the skin 2 (two) times daily.  04/11/16   Fritzi Mandes, MD  insulin lispro (HUMALOG) 100 UNIT/ML injection Inject 2-15 Units into the skin 3 (three) times daily before meals. Sliding scale (typically ~10 units with each meal)    [provider]  Insulin Pen Needle (PRODIGY MINI PEN NEEDLES) 31G X 5 MM MISC by Does not apply route as directed.    [provider]  lanthanum (FOSRENOL) 1000 MG chewable tablet Chew 1,000 mg by mouth 3 (three) times daily with meals.    [provider]  metoprolol succinate (TOPROL-XL) 25 MG 24 hr tablet Take 25 mg by mouth every evening.     [provider]  ondansetron (ZOFRAN) 4 MG tablet take 1 tablet by mouth every 8 hours if needed for nausea 11/07/16   [provider]  rosuvastatin (CRESTOR) 20 MG tablet Take 20 mg by mouth at bedtime.     [provider]  vancomycin (VANCOCIN) 125 MG capsule Take 1 capsule (125 mg total) by mouth 4 (four) times daily. Patient not taking: Reported on 02/19/2017 12/25/16   Harvest Dark, MD    Family History  Problem Relation Age of Onset  . Hypertension Mother   . Hyperlipidemia Mother   . Heart disease Mother   . Heart disease Father   . Hyperlipidemia Father   . Hypertension Father      Social History  Substance Use Topics  . Smoking status: Former Smoker    Packs/day: 0.50    Years: 0.00    Types: Cigarettes    Quit date: 06/11/1986  . Smokeless tobacco: Never Used  . Alcohol use No     Comment: socially    Allergies as of 02/19/2017 - Review Complete 02/19/2017  Allergen Reaction Noted  . Ezetimibe Other (See Comments) 09/07/2014  . Lipitor  [atorvastatin] Other (See Comments) 04/09/2016    Review of Systems:    All systems reviewed and negative except where noted in HPI.   Physical Exam:  Vital signs in last 24 hours: Weight:  [214 lb (97.1 kg)] 214 lb (97.1 kg) (09/11 1530)   General:   Pleasant, cooperative in NAD Head:  Normocephalic and atraumatic. Eyes:   No icterus.   Conjunctiva pink. PERRLA. Ears:  Normal auditory acuity. Neck:  Supple; no masses or thyroidomegaly Lungs: Respirations even and unlabored. Lungs clear to auscultation bilaterally.   No wheezes, crackles, or rhonchi.  Heart:  Regular rate and rhythm;  Without murmur, clicks, rubs or gallops Abdomen:  Soft, nondistended, nontender. Normal bowel sounds. No appreciable masses  or hepatomegaly.  No rebound or guarding.  Rectal:  Not performed. Neurologic:  Alert and oriented x3;  grossly normal neurologically. Skin:  Intact without significant lesions or rashes. Cervical Nodes:  No significant cervical adenopathy. Psych:  Alert and cooperative. Normal affect.  LAB RESULTS: No results for input(s): WBC, HGB, HCT, PLT in the last 72 hours. BMET No results for input(s): NA, K, CL, CO2, GLUCOSE, BUN, CREATININE, CALCIUM in the last 72 hours. LFT No results for input(s): PROT, ALBUMIN, AST, ALT, ALKPHOS, BILITOT, BILIDIR, IBILI in the last 72 hours. PT/INR No results for input(s): LABPROT, INR in the last 72 hours.  STUDIES: No results found.    Impression / Plan:   Terry Tyler is a 66 y.o. y/o male with recurrent C diff managed by [facility] clinic, still on vancomycin , today has formed stool .  Plan  1. IF stool is FORMED, Do not Check stool for C diff, as you will get a false positive test.   2. If stool is diarrhea then can check for c diff and if positive can start on dificid and follow up with [facility] clinic for possible stool transplant.   I will sign off.  Please call me if any further GI concerns or questions.  We would like to thank  you for the opportunity to participate in the care of Terry Tyler.    Thank you for involving me in the care of this patient.      LOS: 0 days   Jonathon Bellows, MD  02/19/2017, 3:43 PM

## 2017-02-19 NOTE — H&P (Signed)
[facility] [place] NAME: Terry Tyler    MR#:  [id number]  DATE OF BIRTH:  Nov 26, 1950  DATE OF ADMISSION:  02/19/2017  PRIMARY CARE PHYSICIAN: Marguerita Merles, MD   REQUESTING/REFERRING PHYSICIAN: Elisabeth Cara- NP at [place]:  No chief complaint on file.   HISTORY OF PRESENT ILLNESS: Terry Tyler  is a 66 y.o. male with a known history of C diff, ESRD on HD, DM, HLD, Htn, Stroke, CAD- Had C diff diagnosed 6-7 weeks ago- given 10 day treatment from ER, then again seen 10 days ago by PMD office, c diff was positive so given another course oral vanc 10 days. Still cont to have diarrhea, not able to eat enough, went to see if PMDs office today again and have lost 10 pounds weight in last 2 months. So she decided to send him to be admitted to the hospital for better treatment and GI consult. Patient denies any blood in the stool, abdominal pain or vomiting.  PAST MEDICAL HISTORY:   Past Medical History:  Diagnosis Date  . C. difficile diarrhea   . CKD (chronic kidney disease)   . Coronary atherosclerosis of unspecified type of vessel, native or graft    Myocardial infarction in 2006. Cardiac catheterization showed significant three-vessel coronary artery disease. He underwent CABG at [facility]. Most recent cardiac catheterization in 2010 showed normal LV systolic function, patent grafts to OM1, RCA and LAD. Occluded SVG to diagonal.  . 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 Dialysis patient [facility])    Mon. Wed. Fri. for dialysis  . Hyperlipidemia   . Hypertension   . Hypertension, renal disease   . Impotence of organic origin   . Keloid scar   . Myocardial infarction ([place]) 2006  . Neoplasm of uncertain behavior, site unspecified   . Peripheral vascular disease, unspecified ([place])   . Proteinuria   . Stroke [facility]) 04/2016   TIA  . Unspecified vitamin D deficiency     PAST SURGICAL HISTORY:  Past  Surgical History:  Procedure Laterality Date  . A/V FISTULAGRAM Left 01/03/2017   Procedure: A/V Fistulagram;  Surgeon: Algernon Huxley, MD;  Location: [place] CV LAB;  Service: Cardiovascular;  Laterality: Left;  . A/V SHUNT INTERVENTION N/A 01/03/2017   Procedure: A/V Shunt Intervention;  Surgeon: Algernon Huxley, MD;  Location: [place] CV LAB;  Service: Cardiovascular;  Laterality: N/A;  . AV FISTULA PLACEMENT Left 10/25/2016   Procedure: ARTERIOVENOUS (AV) FISTULA CREATION ( RADIOCEPHALIC );  Surgeon: Algernon Huxley, MD;  Location: [facility] ORS;  Service: Vascular;  Laterality: Left;  . CARDIAC CATHETERIZATION  12/2004   [facility];[place]  . CARDIAC CATHETERIZATION  09/2008   [facility];[place]  . CORONARY ANGIOPLASTY    . CORONARY ARTERY BYPASS GRAFT  2006   [facility]  . CORONARY STENT INTERVENTION N/A 09/10/2016   Procedure: Coronary Stent Intervention;  Surgeon: Isaias Cowman, MD;  Location: [place] CV LAB;  Service: Cardiovascular;  Laterality: N/A;  . DIALYSIS/PERMA CATHETER REMOVAL N/A 02/14/2017   Procedure: DIALYSIS/PERMA CATHETER REMOVAL;  Surgeon: Algernon Huxley, MD;  Location: [place] CV LAB;  Service: Cardiovascular;  Laterality: N/A;  . keloid removal     right neck  . LEFT HEART CATH AND CORONARY ANGIOGRAPHY N/A 09/10/2016   Procedure: Left Heart Cath and Coronary Angiography;  Surgeon: Isaias Cowman, MD;  Location: [place] CV LAB;  Service: Cardiovascular;  Laterality: N/A;    SOCIAL HISTORY:  Social History  Substance Use Topics  . Smoking status: Former Smoker    Packs/day: 0.50    Years: 0.00    Types: Cigarettes    Quit date: 06/11/1986  . Smokeless tobacco: Never Used  . Alcohol use No     Comment: socially    FAMILY HISTORY:  Family History  Problem Relation Age of Onset  . Hypertension Mother   . Hyperlipidemia Mother   . Heart disease Mother   . Heart disease Father   . Hyperlipidemia Father   . Hypertension Father     DRUG ALLERGIES:   Allergies  Allergen Reactions  . Ezetimibe Other (See Comments)    Cannot remember what the allergy was.  . Lipitor [Atorvastatin] Other (See Comments)    Muscle cramping    REVIEW OF SYSTEMS:   CONSTITUTIONAL: No fever, fatigue or weakness.  EYES: No blurred or double vision.  EARS, NOSE, AND THROAT: No tinnitus or ear pain.  RESPIRATORY: No cough, shortness of breath, wheezing or hemoptysis.  CARDIOVASCULAR: No chest pain, orthopnea, edema.  GASTROINTESTINAL: No nausea, vomiting, have continued diarrhea , no abdominal pain.  GENITOURINARY: No dysuria, hematuria.  ENDOCRINE: No polyuria, nocturia,  HEMATOLOGY: No anemia, easy bruising or bleeding SKIN: No rash or lesion. MUSCULOSKELETAL: No joint pain or arthritis.   NEUROLOGIC: No tingling, numbness, weakness.  PSYCHIATRY: No anxiety or depression.   MEDICATIONS AT HOME:  Prior to Admission medications   Medication Sig Start Date End Date Taking? Authorizing Provider  amLODipine (NORVASC) 10 MG tablet Take 10 mg by mouth daily.   Yes [provider]  aspirin EC 81 MG tablet Take 81 mg by mouth every evening.   Yes [provider]  clopidogrel (PLAVIX) 75 MG tablet Take 1 tablet (75 mg total) by mouth daily. Patient taking differently: Take 75 mg by mouth every evening.  04/11/16  Yes Fritzi Mandes, MD  furosemide (LASIX) 40 MG tablet Take 40 mg by mouth daily.    Yes [provider]  gabapentin (NEURONTIN) 300 MG capsule Take 300 mg by mouth 3 (three) times daily.   Yes [provider]  hydrALAZINE (APRESOLINE) 50 MG tablet Take 50 mg by mouth 3 (three) times daily.    Yes [provider]  insulin glargine (LANTUS) 100 UNIT/ML injection Inject 0.25 mLs (25 Units total) into the skin at bedtime. Takes 80 units at bedtimed;Titrate 4 units every 4 days unitl FBG are in the 130s. Patient taking differently: Inject 30 Units into the skin 2 (two) times daily.  04/11/16  Yes Fritzi Mandes, MD   metoprolol succinate (TOPROL-XL) 25 MG 24 hr tablet Take 25 mg by mouth every evening.    Yes [provider]  rosuvastatin (CRESTOR) 20 MG tablet Take 20 mg by mouth at bedtime.    Yes [provider]  insulin lispro (HUMALOG) 100 UNIT/ML injection Inject 2-15 Units into the skin 3 (three) times daily before meals. Sliding scale (typically ~10 units with each meal)    [provider]  Insulin Pen Needle (PRODIGY MINI PEN NEEDLES) 31G X 5 MM MISC by Does not apply route as directed.    [provider]  vancomycin (VANCOCIN) 125 MG capsule Take 1 capsule (125 mg total) by mouth 4 (four) times daily. Patient not taking: Reported on 02/19/2017 12/25/16   Harvest Dark, MD      PHYSICAL EXAMINATION:   VITAL SIGNS: Height 6' (1.829 m), weight 97.1  kg (214 lb).  GENERAL:  66 y.o.-year-old patient lying in the bed with no acute distress.  EYES: Pupils equal, round, reactive to light and accommodation. No scleral icterus. Extraocular muscles intact.  HEENT: Head atraumatic, normocephalic. Oropharynx and nasopharynx clear.  NECK:  Supple, no jugular venous distention. No thyroid enlargement, no tenderness.  LUNGS: Normal breath sounds bilaterally, no wheezing, rales,rhonchi or crepitation. No use of accessory muscles of respiration.  CARDIOVASCULAR: S1, S2 normal. No murmurs, rubs, or gallops.  ABDOMEN: Soft, nontender, nondistended. Bowel sounds present. No organomegaly or mass.  EXTREMITIES: No pedal edema, cyanosis, or clubbing. Left arm fistula present. NEUROLOGIC: Cranial nerves II through XII are intact. Muscle strength 5/5 in all extremities. Sensation intact. Gait not checked.  PSYCHIATRIC: The patient is alert and oriented x 3.  SKIN: No obvious rash, lesion, or ulcer.   LABORATORY PANEL:   CBC No results for input(s): WBC, HGB, HCT, PLT, MCV, MCH, MCHC, RDW, LYMPHSABS, MONOABS, EOSABS, BASOSABS, BANDABS in the last 168 hours.  Invalid input(s):  NEUTRABS, BANDSABD ------------------------------------------------------------------------------------------------------------------  Chemistries  No results for input(s): NA, K, CL, CO2, GLUCOSE, BUN, CREATININE, CALCIUM, MG, AST, ALT, ALKPHOS, BILITOT in the last 168 hours.  Invalid input(s): GFRCGP ------------------------------------------------------------------------------------------------------------------ CrCl cannot be calculated (Patient's most recent lab result is older than the maximum 21 days allowed.). ------------------------------------------------------------------------------------------------------------------ No results for input(s): TSH, T4TOTAL, T3FREE, THYROIDAB in the last 72 hours.  Invalid input(s): FREET3   Coagulation profile No results for input(s): INR, PROTIME in the last 168 hours. ------------------------------------------------------------------------------------------------------------------- No results for input(s): DDIMER in the last 72 hours. -------------------------------------------------------------------------------------------------------------------  Cardiac Enzymes No results for input(s): CKMB, TROPONINI, MYOGLOBIN in the last 168 hours.  Invalid input(s): CK ------------------------------------------------------------------------------------------------------------------ Invalid input(s): POCBNP  ---------------------------------------------------------------------------------------------------------------  Urinalysis    Component Value Date/Time   COLORURINE YELLOW (A) 09/09/2016 1202   APPEARANCEUR CLEAR (A) 09/09/2016 1202   LABSPEC 1.008 09/09/2016 1202   PHURINE 7.0 09/09/2016 1202   GLUCOSEU >=500 (A) 09/09/2016 1202   HGBUR NEGATIVE 09/09/2016 1202   BILIRUBINUR NEGATIVE 09/09/2016 1202   KETONESUR NEGATIVE 09/09/2016 1202   PROTEINUR 100 (A) 09/09/2016 1202   NITRITE NEGATIVE 09/09/2016 1202   LEUKOCYTESUR NEGATIVE  09/09/2016 1202     RADIOLOGY: No results found.  EKG: Orders placed or performed during the hospital encounter of 09/08/16  . ED EKG  . ED EKG  . EKG 12-Lead  . EKG 12-Lead  . EKG 12-Lead  . EKG 12-Lead  . EKG 12-Lead immediately post procedure  . EKG 12-Lead  . EKG 12-Lead immediately post procedure  . EKG 12-Lead    IMPRESSION AND PLAN:  * Recurrent C. Difficile   Give IV Flagyl and oral vancomycin.   Consult gastroenterologist for further help as this is the third time treatment in last 2 months.  * Diabetes   Sliding scale coverage.  * History of CAD   Continue aspirin, Plavix, metoprolol, statin.  * Hypertension   Continue metoprolol.  * End-stage renal disease on hemodialysis   Nephrology consult to continue dialysis in hospital.  As he has limited IV access and difficult IV lines, we are not able to get blood sample so far so I am not sure about any electrolyte abnormalities. I spoke to the nurse to keep trying getting some blood samples, he has a peripheral IV line but as per nursing protocol they cannot use that for blood samples. He is going for hemodialysis tomorrow.  All the records are reviewed and case discussed with ED provider.  Management plans discussed with the patient, family and they are in agreement.  CODE STATUS: Full code.    Code Status Orders        Start     Ordered   02/19/17 1535  Full code  Continuous     02/19/17 1535    Code Status History    Date Active Date Inactive Code Status Order ID Comments User Context   01/03/2017  9:26 AM 01/03/2017  2:26 PM Full Code [id number]  Algernon Huxley, MD Inpatient   09/08/2016  5:58 PM 09/11/2016  2:25 PM Full Code [id number]  Vaughan Basta, MD Inpatient   04/10/2016 12:35 AM 04/10/2016 12:35 AM Full Code [id number]  [place], Ubaldo Glassing, DO Inpatient   04/10/2016 12:35 AM 04/11/2016  3:25 PM Full Code [id number]  [place], Ubaldo Glassing, DO Inpatient       TOTAL TIME TAKING CARE OF THIS  PATIENT: 50 minutes. Spoke to patient's 2 daughters in the room. 2 daughters in the room.  Vaughan Basta M.D on 02/19/2017   Between 7am to 6pm - Pager - [phone]  After 6pm go to [email or web address] - password [email or web address] [facility]  Office  [phone]  CC: Primary care physician; Marguerita Merles, MD   Note: This dictation was prepared with Dragon dictation along with smaller phrase technology. Any transcriptional errors that result from this process are unintentional.

## 2017-02-20 ENCOUNTER — Inpatient Hospital Stay: Payer: Medicare HMO

## 2017-02-20 DIAGNOSIS — R197 Diarrhea, unspecified: Secondary | ICD-10-CM | POA: Diagnosis not present

## 2017-02-20 LAB — BASIC METABOLIC PANEL
Anion gap: 11 (ref 5–15)
BUN: 23 mg/dL — AB (ref 6–20)
CALCIUM: 9 mg/dL (ref 8.9–10.3)
CO2: 26 mmol/L (ref 22–32)
CREATININE: 4.73 mg/dL — AB (ref 0.61–1.24)
Chloride: 99 mmol/L — ABNORMAL LOW (ref 101–111)
GFR calc Af Amer: 14 mL/min — ABNORMAL LOW (ref >60)
GFR, EST NON AFRICAN AMERICAN: 12 mL/min — AB (ref >60)
GLUCOSE: 126 mg/dL — AB (ref 65–99)
Potassium: 3.6 mmol/L (ref 3.5–5.1)
Sodium: 136 mmol/L (ref 135–145)

## 2017-02-20 LAB — CBC
HCT: 28 % — ABNORMAL LOW (ref 40.0–52.0)
HEMOGLOBIN: 9.4 g/dL — AB (ref 13.0–18.0)
MCH: 27.5 pg (ref 26.0–34.0)
MCHC: 33.4 g/dL (ref 32.0–36.0)
MCV: 82.5 fL (ref 80.0–100.0)
Platelets: 95 10*3/uL — ABNORMAL LOW (ref 150–440)
RBC: 3.4 MIL/uL — ABNORMAL LOW (ref 4.40–5.90)
RDW: 18.8 % — ABNORMAL HIGH (ref 11.5–14.5)
WBC: 6.1 10*3/uL (ref 3.8–10.6)

## 2017-02-20 LAB — GLUCOSE, CAPILLARY
Glucose-Capillary: 114 mg/dL — ABNORMAL HIGH (ref 65–99)
Glucose-Capillary: 175 mg/dL — ABNORMAL HIGH (ref 65–99)
Glucose-Capillary: 149 mg/dL — ABNORMAL HIGH (ref 65–99)

## 2017-02-20 LAB — C DIFFICILE QUICK SCREEN W PCR REFLEX
C DIFFICILE (CDIFF) TOXIN: NEGATIVE
C Diff antigen: NEGATIVE
C Diff interpretation: NOT DETECTED

## 2017-02-20 MED ORDER — NEPRO/CARBSTEADY PO LIQD
237.0000 mL | Freq: Two times a day (BID) | ORAL | Status: DC
  Administered 2017-02-20: 237 mL via ORAL

## 2017-02-20 MED ORDER — GUAIFENESIN-DM 100-10 MG/5ML PO SYRP
5.0000 mL | ORAL_SOLUTION | ORAL | Status: DC | PRN
  Administered 2017-02-20: 5 mL via ORAL
  Filled 2017-02-20: qty 5

## 2017-02-20 MED ORDER — RISAQUAD PO CAPS
1.0000 | ORAL_CAPSULE | Freq: Every day | ORAL | Status: DC
  Administered 2017-02-20 – 2017-02-21 (×2): 1 via ORAL
  Filled 2017-02-20 (×2): qty 1

## 2017-02-20 MED ORDER — LIDOCAINE-PRILOCAINE 2.5-2.5 % EX CREA: 1.0000 "application " | TOPICAL_CREAM | CUTANEOUS | Status: DC | PRN

## 2017-02-20 MED ORDER — RENA-VITE PO TABS
1.0000 | ORAL_TABLET | Freq: Every day | ORAL | Status: DC
  Administered 2017-02-20: 1 via ORAL
  Filled 2017-02-20: qty 1

## 2017-02-20 NOTE — Care Management (Signed)
Terry Tyler HD liaison notified of admission.  

## 2017-02-20 NOTE — Care Management CC44 (Signed)
Condition Code 44 Documentation Completed  Patient Details  Name: Terry Tyler MRN: [id number] Date of Birth: 10-Dec-1950   Condition Code 44 given:  Yes Patient signature on Condition Code 44 notice:  Yes Documentation of 2 MD's agreement:  Yes Code 44 added to claim:  Yes    Beverly Sessions, RN 02/20/2017, 4:49 PM

## 2017-02-20 NOTE — Evaluation (Signed)
Physical Therapy Evaluation Patient Details Name: Terry Tyler MRN: [id number] DOB: Jul 14, 1950 Today's Date: 02/20/2017   History of Present Illness  66 year old past medical history ESRD on HD, previous hx of C. Diff colitis, HTN, Hyperlipidemia, DM, hx of previous MI, CAD, who presents  To the hospital due to weight loss and ongoing diarrhea.     Clinical Impression  Pt presents to PT at baseline functional mobility this date.  Pt c/o some fatigue while ambulating around nursing station and required standing rest break for about 5 seconds.  Recommend pt to pace himself so he doesn't tire out so quickly.  Also recommend pt to continue with HEP from previous HHPT session this year and to increase activity including increased daily ambulation.  No further acute PT needs at this time.    Follow Up Recommendations No PT follow up    Equipment Recommendations  None recommended by PT    Recommendations for Other Services       Precautions / Restrictions Precautions Precautions: Fall Precaution Comments: Moderate Restrictions Weight Bearing Restrictions: No      Mobility  Bed Mobility Overal bed mobility: Independent                Transfers Overall transfer level: Modified independent Equipment used: Rolling walker (2 wheeled)             General transfer comment: Slow to lift off from bed but good body mechanics and balance.  Ambulation/Gait Ambulation/Gait assistance: Modified independent (Device/Increase time) Ambulation Distance (Feet): 180 Feet Assistive device: Rolling walker (2 wheeled) Gait Pattern/deviations: WFL(Within Functional Limits) Gait velocity: WNL's   General Gait Details: Steady gait around nursing station with standing rest break for 10 seconds x2 due to fatigue.  Stairs            Wheelchair Mobility    Modified Rankin (Stroke Patients Only)       Balance Overall balance assessment: Modified Independent;No apparent balance  deficits (not formally assessed)                                           Pertinent Vitals/Pain Pain Assessment: No/denies pain    Home Living Family/patient expects to be discharged to:: Private residence Living Arrangements: Spouse/significant other Available Help at Discharge: Family Type of Home: Mobile home Home Access: Stairs to enter Entrance Stairs-Rails: Right;Left;Can reach both Entrance Stairs-Number of Steps: 4 Home Layout: One level Home Equipment: Walker - 2 wheels;Cane - quad      Prior Function Level of Independence: Independent with assistive device(s)         Comments: Pt denies daily exercise or walks.     Hand Dominance   Dominant Hand: Right    Extremity/Trunk Assessment   Upper Extremity Assessment Upper Extremity Assessment: Overall WFL for tasks assessed    Lower Extremity Assessment Lower Extremity Assessment: Overall WFL for tasks assessed    Cervical / Trunk Assessment Cervical / Trunk Assessment: Normal  Communication   Communication: No difficulties  Cognition Arousal/Alertness: Awake/alert Behavior During Therapy: WFL for tasks assessed/performed Overall Cognitive Status: Within Functional Limits for tasks assessed                                 General Comments: plesant      General Comments General comments (skin integrity,  edema, etc.): visible areas c/d/i    Exercises     Assessment/Plan    PT Assessment Patent does not need any further PT services  PT Problem List         PT Treatment Interventions      PT Goals (Current goals can be found in the Care Plan section)  Acute Rehab PT Goals Patient Stated Goal: To go home. PT Goal Formulation: With patient Time For Goal Achievement: 02/20/17 Potential to Achieve Goals: Good    Frequency     Barriers to discharge        Co-evaluation               AM-PAC PT "6 Clicks" Daily Activity  Outcome Measure Difficulty  turning over in bed (including adjusting bedclothes, sheets and blankets)?: None Difficulty moving from lying on back to sitting on the side of the bed? : None Difficulty sitting down on and standing up from a chair with arms (e.g., wheelchair, bedside commode, etc,.)?: None Help needed moving to and from a bed to chair (including a wheelchair)?: None Help needed walking in hospital room?: None Help needed climbing 3-5 steps with a railing? : None 6 Click Score: 24    End of Session   Activity Tolerance: Patient tolerated treatment well Patient left: in bed;with call bell/phone within reach;with family/visitor present Nurse Communication: Mobility status PT Visit Diagnosis: Muscle weakness (generalized) (M62.81)    Time: 6803-2122 PT Time Calculation (min) (ACUTE ONLY): 25 min   Charges:   PT Evaluation $PT Eval Low Complexity: 1 Low     PT G Codes:   PT G-Codes **NOT FOR INPATIENT CLASS** Functional Assessment Tool Used: AM-PAC 6 Clicks Basic Mobility Functional Limitation: Mobility: Walking and moving around Mobility: Walking and Moving Around Current Status (Q8250): 0 percent impaired, limited or restricted Mobility: Walking and Moving Around Goal Status (I3704): 0 percent impaired, limited or restricted Mobility: Walking and Moving Around Discharge Status (U8891): 0 percent impaired, limited or restricted     Chin Wachter A Lenita Peregrina, PT 02/20/2017, 3:50 PM

## 2017-02-20 NOTE — Progress Notes (Signed)
Pre hd assessment  

## 2017-02-20 NOTE — Progress Notes (Signed)
[facility] [place] NAME: Terry Tyler    MR#:  [id number]  DATE OF BIRTH:  1950/09/17  SUBJECTIVE:   Pt. Seen at HD and tolerating it well.  Patient still complaining of diarrhea but says his stools are sometimes formed and sometimes loose. He admits to a cough which is nonproductive for the past few months.  REVIEW OF SYSTEMS:    Review of Systems  Constitutional: Negative for chills and fever.  HENT: Negative for congestion and tinnitus.   Eyes: Negative for blurred vision and double vision.  Respiratory: Positive for cough. Negative for shortness of breath and wheezing.   Cardiovascular: Negative for chest pain, orthopnea and PND.  Gastrointestinal: Positive for diarrhea. Negative for abdominal pain, nausea and vomiting.  Genitourinary: Negative for dysuria and hematuria.  Neurological: Negative for dizziness, sensory change and focal weakness.  All other systems reviewed and are negative.   Nutrition: Heart Healthy/Carb modified Tolerating Diet: Yes Tolerating PT: Await Eval.   DRUG ALLERGIES:   Allergies  Allergen Reactions  . Ezetimibe Other (See Comments)    Cannot remember what the allergy was.  . Lipitor [Atorvastatin] Other (See Comments)    Muscle cramping    VITALS:  Blood pressure 120/70, pulse 85, temperature 98.5 F (36.9 C), temperature source Oral, resp. rate 19, height 6' (1.829 m), weight 97.1 kg (214 lb), SpO2 100 %.  PHYSICAL EXAMINATION:   Physical Exam  GENERAL:  66 y.o.-year-old patient lying in bed getting HD in NAD.  EYES: Pupils equal, round, reactive to light and accommodation. No scleral icterus. Extraocular muscles intact.  HEENT: Head atraumatic, normocephalic. Oropharynx and nasopharynx clear.  NECK:  Supple, no jugular venous distention. No thyroid enlargement, no tenderness.  LUNGS: Normal breath sounds bilaterally, no wheezing, rales, rhonchi. No use of accessory muscles of respiration.   CARDIOVASCULAR: S1, S2 normal. No murmurs, rubs, or gallops.  ABDOMEN: Soft, nontender, nondistended. Bowel sounds present. No organomegaly or mass.  EXTREMITIES: No cyanosis, clubbing or edema b/l.    NEUROLOGIC: Cranial nerves II through XII are intact. No focal Motor or sensory deficits b/l.   PSYCHIATRIC: The patient is alert and oriented x 3.  SKIN: No obvious rash, lesion, or ulcer.   Left upper Ext. AV fistula with good bruit/thrill.    LABORATORY PANEL:   CBC  Recent Labs Lab 02/20/17 0429  WBC 6.1  HGB 9.4*  HCT 28.0*  PLT 95*   ------------------------------------------------------------------------------------------------------------------  Chemistries   Recent Labs Lab 02/19/17 1619 02/20/17 0429  NA 136 136  K 3.5 3.6  CL 97* 99*  CO2 26 26  GLUCOSE 139* 126*  BUN 19 23*  CREATININE 4.40* 4.73*  CALCIUM 9.2 9.0  AST 61*  --   ALT 41  --   ALKPHOS 548*  --   BILITOT 4.9*  --    ------------------------------------------------------------------------------------------------------------------  Cardiac Enzymes No results for input(s): TROPONINI in the last 168 hours. ------------------------------------------------------------------------------------------------------------------  RADIOLOGY:  No results found.   ASSESSMENT AND PLAN:   66 year old past medical history ESRD on HD, previous hx of C. Diff colitis, HTN, Hyperlipidemia, DM, hx of previous MI, CAD, who presents  To the hospital due to weight loss and ongoing diarrhea.   1. Diarrhea-etiology unclear presently. Patient's stool is negative for C. Difficile presently. Patient has had previous history of recurrent C. Difficile but currently is not positive. Unlikely patient has C. Difficile colitis presently. -Appreciate gastroenterology input and no plans for further intervention.  I will discontinue patient's oral vancomycin and IV Flagyl for now. Start the pt. On a Probiotic.   2. Cough -  etiology unclear.  - not hypoxic. Will check CXR.  Will start some anti-tussives.   3. ESRD on HD - pt. Is on HD MWF and will cont. On that schedule.  - Nephrology consulted.   4. Secondary Hyperparathyroidism - cont. Sensipar.   5.  Hx of CAD - no acute chest pain.  - cont. ASA, Plavix, Toprol, Crestor.   6. DM - cont. SSI. Follow BS   All the records are reviewed and case discussed with Care Management/Social Worker. Management plans discussed with the patient, family and they are in agreement.  CODE STATUS: Full code  DVT Prophylaxis: Hep. SQ  TOTAL TIME TAKING CARE OF THIS PATIENT: 30 minutes.   POSSIBLE D/C IN 1-2 DAYS, DEPENDING ON CLINICAL CONDITION.   Henreitta Leber M.D on 02/20/2017 at 2:15 PM  Between 7am to 6pm - Pager - [phone]  After 6pm go to [email or web address] - password [email or web address] [facility]  Office  [phone]  CC: Primary care physician; Marguerita Merles, MD

## 2017-02-20 NOTE — Progress Notes (Signed)
Post hd assessment 

## 2017-02-20 NOTE — Progress Notes (Signed)
Initial Nutrition Assessment  DOCUMENTATION CODES:   Not applicable  INTERVENTION:   Nepro Shake po BID, each supplement provides 425 kcal and 19 grams protein  Renal MVI  Recommend daily probiotics   NUTRITION DIAGNOSIS:   Increased nutrient needs related to  (ESRD on HD) as evidenced by increased estimated needs from protein.  GOAL:   Patient will meet greater than or equal to 90% of their needs  MONITOR:   PO intake, Supplement acceptance, Labs, Weight trends, I & O's  REASON FOR ASSESSMENT:   Malnutrition Screening Tool    ASSESSMENT:    66 y.o. male with a known history of C diff, ESRD on HD, DM, HLD, Htn, Stroke, CAD- Had C diff diagnosed 6-7 weeks ago- given 10 day treatment from ER, then again seen 10 days ago by PMD office, c diff was positive so given another course oral vanc 10 days.   Met with pt in room today. Pt reports that he is still having diarrhea today; he had a bowel movement this morning and did not make it to the toilet. Pt documented to be having Type 6 stools; unsure if true diarrhea. Pt reports that his appetite has remained good but he is afraid to eat much in fear that he will have diarrhea. He reports that "everything runs through him". Pt also avoiding supplements for the same reason. Pt reports intermittent vomiting for the past several months; pt reports that he vomited twice yesterday. RD is unsure if pt is confused but there is no documentation that pt vomited yesterday or pta. Per chart, pt has lost 26lbs(11%) in 3 months; this is significant. Pt ate 100% of his breakfast this morning. RD discussed the importance of adequate nutrition as pt is on HD. Pt is willing to try Nepro; advised pt to sip on it at first to see how he handles it; told pt that if Nepro does not worsen his diarrhea, it would be beneficial to him. Patient may also benefit from probiotics as he has been on/off antibiotics for several weeks.       Medications reviewed and  include: aspirin, plavix, epogen, heparin, insulin, vancomycin, metronidazole  Labs reviewed: Cl 99(L), BUN 23(H), creat 4.73(H) AlkPhos 548(H), alb 2.9(L), Lipase 61(H)- 9/11 Hgb 9.4(L), Hct 28.0(L) cbgs- 139, 126 x 24 hrs AIC 7.9(H)- 3/31  Nutrition-Focused physical exam completed. Findings are no fat depletion, no muscle depletion, and no edema.   Diet Order:  Diet heart healthy/carb modified Room service appropriate? Yes; Fluid consistency: Thin  Skin:  Reviewed, no issues  Last BM:  9/11  Height:   Ht Readings from Last 1 Encounters:  02/19/17 6' (1.829 m)    Weight:   Wt Readings from Last 1 Encounters:  02/19/17 214 lb (97.1 kg)    Ideal Body Weight:  80.9 kg  BMI:  Body mass index is 29.02 kg/m.  Estimated Nutritional Needs:   Kcal:  2200-2500kcal/day   Protein:  117-135g/day   Fluid:  >2L/day or per MD  EDUCATION NEEDS:   Education needs addressed  Koleen Distance MS, RD, LDN Pager #[phone] After Hours Pager: [phone]

## 2017-02-20 NOTE — Progress Notes (Signed)
Hd start, alert, no c/o, vss

## 2017-02-20 NOTE — Progress Notes (Signed)
  End of hd 

## 2017-02-20 NOTE — Care Management Obs Status (Signed)
[facility] NOTIFICATION   Patient Details  Name: Terry Tyler MRN: [id number] Date of Birth: 1950/06/24   Medicare Observation Status Notification Given:  Yes    Beverly Sessions, RN 02/20/2017, 4:49 PM

## 2017-02-20 NOTE — Progress Notes (Signed)
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   Pt seen on the hemodialysis unit. He states he continued to have lose stools this morning. He is also complaining of a cough. Denies abdominal pain and vomiting.     HEMODIALYSIS FLOWSHEET:  Blood Flow Rate (mL/min): 350 mL/min Arterial Pressure (mmHg): -180 mmHg Venous Pressure (mmHg): 160 mmHg Transmembrane Pressure (mmHg): 60 mmHg Ultrafiltration Rate (mL/min): 290 mL/min Dialysate Flow Rate (mL/min): 600 ml/min Conductivity: Machine : 13.9 Conductivity: Machine : 13.9 Dialysis Fluid Bolus: Normal Saline Bolus Amount (mL): 250 mL (prime) Dialysate Change:  (3k)    Objective:  Vital signs in last 24 hours:  Temp:  [97.5 F (36.4 C)-99.3 F (37.4 C)] 97.5 F (36.4 C) (09/12 1000) Pulse Rate:  [81-107] 84 (09/12 1040) Resp:  [18-21] 20 (09/12 1040) BP: (116-159)/(71-87) 126/78 (09/12 1040) SpO2:  [93 %-99 %] 95 % (09/12 1040) Weight:  [97.1 kg (214 lb)] 97.1 kg (214 lb) (09/11 1530)  Weight change:  Filed Weights   02/19/17 1530  Weight: 97.1 kg (214 lb)    Intake/Output:    Intake/Output Summary (Last 24 hours) at 02/20/17 1123 Last data filed at 02/20/17 0900  Gross per 24 hour  Intake              340 ml  Output              300 ml  Net               40 ml     Physical Exam: General: Pt is lying down in NAD undergoing HD  HEENT Moist oral mucous membranes  Neck No venous distention  Pulm/lungs Clear to ausculation bilaterally, mild ronchi  CVS/Heart Regular rate and rhythym  Abdomen:  Soft and non-tender to palpation  Extremities:  mild dependent lower extremity swelling  Neurologic: Alert and talking  Skin: No rashes present  Access: Left forearm fistula       Basic Metabolic Panel:   Recent Labs Lab 02/19/17 1619 02/20/17 0429  NA 136  136  K 3.5 3.6  CL 97* 99*  CO2 26 26  GLUCOSE 139* 126*  BUN 19 23*  CREATININE 4.40* 4.73*  CALCIUM 9.2 9.0     CBC:  Recent Labs Lab 02/19/17 1619 02/20/17 0429  WBC 7.4 6.1  HGB 9.7* 9.4*  HCT 28.6* 28.0*  MCV 79.4* 82.5  PLT 116* 95*     No results found for: HEPBSAG, HEPBSAB, HEPBIGM    Microbiology:  Recent Results (from the past 240 hour(s))  C difficile quick scan w PCR reflex     Status: None   Collection Time: 02/20/17  1:34 AM  Result Value Ref Range Status   C Diff antigen NEGATIVE NEGATIVE Final   C Diff toxin NEGATIVE NEGATIVE Final   C Diff interpretation No C. difficile detected.  Final    Coagulation Studies: No results for input(s): LABPROT, INR in the last 72 hours.  Urinalysis: No results for input(s): COLORURINE, LABSPEC, PHURINE, GLUCOSEU, HGBUR, BILIRUBINUR, KETONESUR, PROTEINUR, UROBILINOGEN, NITRITE, LEUKOCYTESUR in the last 72 hours.  Invalid input(s): APPERANCEUR    Imaging: No results found.   Medications:   . metronidazole Stopped (02/20/17 8315)   . aspirin EC  81 mg Oral QPM  . cinacalcet  30 mg Oral Q breakfast  . clopidogrel  75  mg Oral Daily  . epoetin (EPOGEN/PROCRIT) injection  4,000 Units Intravenous Q M,W,F-HD  . feeding supplement (NEPRO CARB STEADY)  237 mL Oral BID BM  . gabapentin  300 mg Oral TID  . heparin  5,000 Units Subcutaneous Q8H  . insulin aspart  0-9 Units Subcutaneous TID WC  . metoprolol succinate  25 mg Oral QPM  . multivitamin  1 tablet Oral QHS  . rosuvastatin  20 mg Oral QHS  . vancomycin  125 mg Oral Q6H     Assessment/ Plan:  66 y.o. 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   ESRD - Dialysis today  [address], [facility] Nephrology - Patient seen during dialysis Tolerating well   Anemia of CKD - EPO to be given at HD  Secondary Hyperparathyroidism - Will monitor  - Continue cinacalcet  Cdiff  Colitis - treatment as primary team.  - Cdiff antigen result was negative     LOS: 1 [facility] 9/12/201811:23 AM  [place], [place]

## 2017-02-20 NOTE — Progress Notes (Signed)
Pre hd info 

## 2017-02-20 NOTE — Progress Notes (Signed)
Post hd vitals 

## 2017-02-21 DIAGNOSIS — R197 Diarrhea, unspecified: Secondary | ICD-10-CM | POA: Diagnosis not present

## 2017-02-21 LAB — GLUCOSE, CAPILLARY
GLUCOSE-CAPILLARY: 142 mg/dL — AB (ref 65–99)
Glucose-Capillary: 161 mg/dL — ABNORMAL HIGH (ref 65–99)

## 2017-02-21 LAB — HEPATITIS B SURFACE ANTIGEN: HEP B S AG: NEGATIVE

## 2017-02-21 MED ORDER — RISAQUAD PO CAPS: 1.0000 | ORAL_CAPSULE | Freq: Every day | ORAL | 1 refills | Status: DC

## 2017-02-21 NOTE — Care Management (Signed)
Amanda Morris HD liaison notified of discharge.  

## 2017-02-21 NOTE — Progress Notes (Signed)
Discharge Instructions reviewed with patient and spouse with patient's permission. Patient and spouse verbalized understanding. Rx given to patient for Risquad. Patient to be escorted to vehicle via wheelchair; patient's spouse to drive home.

## 2017-02-21 NOTE — Discharge Summary (Signed)
[place] at [place] NAME: Terry Tyler    MR#:  [id number]  DATE OF BIRTH:  03-Oct-1950  DATE OF ADMISSION:  02/19/2017 ADMITTING PHYSICIAN: Vaughan Basta, MD  DATE OF DISCHARGE: 02/21/2017 12:19 PM  PRIMARY CARE PHYSICIAN: Marguerita Merles, MD    ADMISSION DIAGNOSIS:  C Diff  DISCHARGE DIAGNOSIS:  Principal Problem:   C. difficile diarrhea Active Problems:   Diarrhea   SECONDARY DIAGNOSIS:   Past Medical History:  Diagnosis Date  . C. difficile diarrhea   . CKD (chronic kidney disease)   . Coronary atherosclerosis of unspecified type of vessel, native or graft    Myocardial infarction in 2006. Cardiac catheterization showed significant three-vessel coronary artery disease. He underwent CABG at [facility]. Most recent cardiac catheterization in 2010 showed normal LV systolic function, patent grafts to OM1, RCA and LAD. Occluded SVG to diagonal.  . 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 Dialysis patient [facility])    Mon. Wed. Fri. for dialysis  . Hyperlipidemia   . Hypertension   . Hypertension, renal disease   . Impotence of organic origin   . Keloid scar   . Myocardial infarction ([place]) 2006  . Neoplasm of uncertain behavior, site unspecified   . Peripheral vascular disease, unspecified ([place])   . Proteinuria   . Stroke [facility]) 04/2016   TIA  . Unspecified vitamin D deficiency     HOSPITAL COURSE:   66 year old past medical history ESRD on HD, previous hx of C. Diff colitis, HTN, Hyperlipidemia, DM, hx of previous MI, CAD, who presents  To the hospital due to weight loss and ongoing diarrhea.   1. Diarrhea- due to pt. Being on Nephro supplements.   Patient has a previous history of C. Difficile colitis and therefore was admitted nd underwent C. Difficile testing which was negative.  Patient was started on a probiotic, a gastroenterology consult was obtained. Patient was taken off his oral vancomycin and  IV Flagyl and his diarrhea is improved and he is currently not having any acute symptoms. Patient was discharged on a probiotic and advised to eat yogurt daily and follow up with GI as needed.  2. Cough - etiology unclear. CXR was (-) for any acute pathology. ?? Related to GERD. Now resolved.  - not hypoxic and pt. Will follow up with his PCP as outpatient.   3. ESRD on HD - pt. Is on HD MWF and will cont. On that schedule.  - Nephrology was consulted and he received HD while in the hospital and will resume his schedule as stated above.   4. Secondary Hyperparathyroidism -  He will cont. Sensipar.   5.  Hx of CAD - he had no acute chest pain.  - he will cont. ASA, Plavix, Toprol, Crestor.   6. DM - while in the hospital pt. Was on SSI and will resume his lantus, Lispro with meals upon discharge.   DISCHARGE CONDITIONS:   Stable.   CONSULTS OBTAINED:  Treatment Team:  Murlean Iba, MD  DRUG ALLERGIES:   Allergies  Allergen Reactions  . Ezetimibe Other (See Comments)    Cannot remember what the allergy was.  . Lipitor [Atorvastatin] Other (See Comments)    Muscle cramping    DISCHARGE MEDICATIONS:   Allergies as of 02/21/2017      Reactions   Ezetimibe Other (See Comments)   Cannot remember what the allergy was.   Lipitor [atorvastatin] Other (See  Comments)   Muscle cramping      Medication List    STOP taking these medications   vancomycin 125 MG capsule Commonly known as:  VANCOCIN     TAKE these medications   acidophilus Caps capsule Take 1 capsule by mouth daily.   amLODipine 10 MG tablet Commonly known as:  NORVASC Take 10 mg by mouth daily.   aspirin EC 81 MG tablet Take 81 mg by mouth every evening.   clopidogrel 75 MG tablet Commonly known as:  PLAVIX Take 1 tablet (75 mg total) by mouth daily. What changed:  when to take this   furosemide 40 MG tablet Commonly known as:  LASIX Take 40 mg by mouth daily.   gabapentin 300 MG  capsule Commonly known as:  NEURONTIN Take 300 mg by mouth 3 (three) times daily.   hydrALAZINE 50 MG tablet Commonly known as:  APRESOLINE Take 50 mg by mouth 3 (three) times daily.   insulin glargine 100 UNIT/ML injection Commonly known as:  LANTUS Inject 0.25 mLs (25 Units total) into the skin at bedtime. Takes 80 units at bedtimed;Titrate 4 units every 4 days unitl FBG are in the 130s. What changed:  how much to take  when to take this  additional instructions   insulin lispro 100 UNIT/ML injection Commonly known as:  HUMALOG Inject 2-15 Units into the skin 3 (three) times daily before meals. Sliding scale (typically ~10 units with each meal)   metoprolol succinate 25 MG 24 hr tablet Commonly known as:  TOPROL-XL Take 25 mg by mouth every evening.   PRODIGY MINI PEN NEEDLES 31G X 5 MM Misc Generic drug:  Insulin Pen Needle by Does not apply route as directed.   rosuvastatin 20 MG tablet Commonly known as:  CRESTOR Take 20 mg by mouth at bedtime.            Discharge Care Instructions        Start     Ordered   02/22/17 0000  acidophilus (RISAQUAD) CAPS capsule  Daily     02/21/17 1112   02/21/17 0000  Activity as tolerated - No restrictions     02/21/17 1112   02/21/17 0000  Diet - low sodium heart healthy     02/21/17 1112   02/21/17 0000  Diet Carb Modified     02/21/17 1112        DISCHARGE INSTRUCTIONS:   DIET:  Cardiac diet and Diabetic diet  DISCHARGE CONDITION:  Stable  ACTIVITY:  Activity as tolerated  OXYGEN:  Home Oxygen: No.   Oxygen Delivery: room air  DISCHARGE LOCATION:  home   If you experience worsening of your admission symptoms, develop shortness of breath, life threatening emergency, suicidal or homicidal thoughts you must seek medical attention immediately by calling 911 or calling your MD immediately  if symptoms less severe.  You Must read complete instructions/literature along with all the possible adverse  reactions/side effects for all the Medicines you take and that have been prescribed to you. Take any new Medicines after you have completely understood and accpet all the possible adverse reactions/side effects.   Please note  You were cared for by a hospitalist during your hospital stay. If you have any questions about your discharge medications or the care you received while you were in the hospital after you are discharged, you can call the unit and asked to speak with the hospitalist on call if the hospitalist that took care of you is not  available. Once you are discharged, your primary care physician will handle any further medical issues. Please note that NO REFILLS for any discharge medications will be authorized once you are discharged, as it is imperative that you return to your primary care physician (or establish a relationship with a primary care physician if you do not have one) for your aftercare needs so that they can reassess your need for medications and monitor your lab values.     Today   Diarrhea resolved, No cough.  CXR was (-) yesterday.  Complaining of feeling generally weak but otherwise doing well. Will d/c home today.   VITAL SIGNS:  Blood pressure 130/75, pulse 78, temperature 98.4 F (36.9 C), temperature source Oral, resp. rate 17, height 6' (1.829 m), weight 97.1 kg (214 lb), SpO2 96 %.  I/O:   Intake/Output Summary (Last 24 hours) at 02/21/17 1527 Last data filed at 02/21/17 1028  Gross per 24 hour  Intake              300 ml  Output                0 ml  Net              300 ml    PHYSICAL EXAMINATION:   GENERAL:  66 y.o.-year-old patient lying in bed getting HD in NAD.  EYES: Pupils equal, round, reactive to light and accommodation. No scleral icterus. Extraocular muscles intact.  HEENT: Head atraumatic, normocephalic. Oropharynx and nasopharynx clear.  NECK:  Supple, no jugular venous distention. No thyroid enlargement, no tenderness.  LUNGS: Normal  breath sounds bilaterally, no wheezing, rales, rhonchi. No use of accessory muscles of respiration.  CARDIOVASCULAR: S1, S2 normal. No murmurs, rubs, or gallops.  ABDOMEN: Soft, nontender, nondistended. Bowel sounds present. No organomegaly or mass.  EXTREMITIES: No cyanosis, clubbing or edema b/l.    NEUROLOGIC: Cranial nerves II through XII are intact. No focal Motor or sensory deficits b/l.   PSYCHIATRIC: The patient is alert and oriented x 3.  SKIN: No obvious rash, lesion, or ulcer.   Left upper Ext. AV fistula with good bruit/thrill.   DATA REVIEW:   CBC  Recent Labs Lab 02/20/17 0429  WBC 6.1  HGB 9.4*  HCT 28.0*  PLT 95*    Chemistries   Recent Labs Lab 02/19/17 1619 02/20/17 0429  NA 136 136  K 3.5 3.6  CL 97* 99*  CO2 26 26  GLUCOSE 139* 126*  BUN 19 23*  CREATININE 4.40* 4.73*  CALCIUM 9.2 9.0  AST 61*  --   ALT 41  --   ALKPHOS 548*  --   BILITOT 4.9*  --     Cardiac Enzymes No results for input(s): TROPONINI in the last 168 hours.  Microbiology Results  Results for orders placed or performed during the hospital encounter of 02/19/17  C difficile quick scan w PCR reflex     Status: None   Collection Time: 02/20/17  1:34 AM  Result Value Ref Range Status   C Diff antigen NEGATIVE NEGATIVE Final   C Diff toxin NEGATIVE NEGATIVE Final   C Diff interpretation No C. difficile detected.  Final    RADIOLOGY:  Dg Chest Port 1 View  Result Date: 02/20/2017 CLINICAL DATA:  66 year old male with a history of chronic cough EXAM: PORTABLE CHEST 1 VIEW COMPARISON:  12/25/2016, 09/08/2016 FINDINGS: Cardiomediastinal silhouette projects enlarged compared to the prior, potentially related to the patient's apical lordotic positioning. Surgical changes  of prior median sternotomy and CABG. Interval removal of right IJ hemodialysis catheter. No confluent airspace disease. No pneumothorax or pleural effusion. Coarsened interstitial markings, potentially related to  prior episodes of edema. No interlobular septal thickening. IMPRESSION: Heart projects enlarged compared to the prior plain film studies, which may be projectional given the patient's apical lordotic positioning. No evidence of acute cardiopulmonary disease. Interval removal of right IJ hemodialysis catheter. Surgical changes of median sternotomy and CABG. Electronically Signed   By: Corrie Mckusick D.O.   On: 02/20/2017 16:38      Management plans discussed with the patient, family and they are in agreement.  CODE STATUS:  Code Status History    Date Active Date Inactive Code Status Order ID Comments User Context   02/19/2017  3:35 PM 02/21/2017  3:25 PM Full Code [id number]  Vaughan Basta, MD Inpatient    TOTAL TIME TAKING CARE OF THIS PATIENT: 40 minutes.    Henreitta Leber M.D on 02/21/2017 at 3:27 PM  Between 7am to 6pm - Pager - [phone]  After 6pm go to [email or web address] - password [email or web address] [facility]  Office  [phone]  CC: Primary care physician; Marguerita Merles, MD

## 2017-02-21 NOTE — Progress Notes (Signed)
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   Patient feels well today.  Denies any acute complaints No shortness of breath No leg edema   Objective:  Vital signs in last 24 hours:  Temp:  [98.4 F (36.9 C)-99 F (37.2 C)] 98.4 F (36.9 C) (09/13 0356) Pulse Rate:  [78-87] 78 (09/13 0850) Resp:  [17-22] 17 (09/13 0850) BP: (120-137)/(70-80) 130/75 (09/13 0850) SpO2:  [95 %-100 %] 96 % (09/13 0850)  Weight change:  Filed Weights   02/19/17 1530  Weight: 97.1 kg (214 lb)    Intake/Output:    Intake/Output Summary (Last 24 hours) at 02/21/17 1253 Last data filed at 02/21/17 1028  Gross per 24 hour  Intake              300 ml  Output             1000 ml  Net             -700 ml     Physical Exam: General: Pt is lying down in NAD undergoing HD  HEENT Moist oral mucous membranes  Neck No venous distention  Pulm/lungs Clear to ausculation bilaterally, mild ronchi  CVS/Heart Regular rate and rhythym  Abdomen:  Soft and non-tender to palpation  Extremities: trace dependent lower extremity swelling  Neurologic: Alert and talking  Skin: No rashes present  Access: Left forearm fistula       Basic Metabolic Panel:   Recent Labs Lab 02/19/17 1619 02/20/17 0429  NA 136 136  K 3.5 3.6  CL 97* 99*  CO2 26 26  GLUCOSE 139* 126*  BUN 19 23*  CREATININE 4.40* 4.73*  CALCIUM 9.2 9.0     CBC:  Recent Labs Lab 02/19/17 1619 02/20/17 0429  WBC 7.4 6.1  HGB 9.7* 9.4*  HCT 28.6* 28.0*  MCV 79.4* 82.5  PLT 116* 95*      Lab Results  Component Value Date   HEPBSAG Negative 02/20/2017      Microbiology:  Recent Results (from the past 240 hour(s))  C difficile quick scan w PCR reflex     Status: None   Collection Time: 02/20/17  1:34 AM  Result Value Ref Range Status   C Diff antigen NEGATIVE  NEGATIVE Final   C Diff toxin NEGATIVE NEGATIVE Final   C Diff interpretation No C. difficile detected.  Final    Coagulation Studies: No results for input(s): LABPROT, INR in the last 72 hours.  Urinalysis: No results for input(s): COLORURINE, LABSPEC, PHURINE, GLUCOSEU, HGBUR, BILIRUBINUR, KETONESUR, PROTEINUR, UROBILINOGEN, NITRITE, LEUKOCYTESUR in the last 72 hours.  Invalid input(s): APPERANCEUR    Imaging: Dg Chest Port 1 View  Result Date: 02/20/2017 CLINICAL DATA:  66 year old male with a history of chronic cough EXAM: PORTABLE CHEST 1 VIEW COMPARISON:  12/25/2016, 09/08/2016 FINDINGS: Cardiomediastinal silhouette projects enlarged compared to the prior, potentially related to the patient's apical lordotic positioning. Surgical changes of prior median sternotomy and CABG. Interval removal of right IJ hemodialysis catheter. No confluent airspace disease. No pneumothorax or pleural effusion. Coarsened interstitial markings, potentially related to prior episodes of edema. No interlobular septal thickening. IMPRESSION: Heart projects enlarged compared to the prior plain film studies, which may be projectional given the patient's apical lordotic positioning. No evidence of acute cardiopulmonary disease. Interval removal of right IJ hemodialysis catheter. Surgical changes of  median sternotomy and CABG. Electronically Signed   By: Corrie Mckusick D.O.   On: 02/20/2017 16:38     Medications:    . acidophilus  1 capsule Oral Daily  . aspirin EC  81 mg Oral QPM  . cinacalcet  30 mg Oral Q breakfast  . clopidogrel  75 mg Oral Daily  . epoetin (EPOGEN/PROCRIT) injection  4,000 Units Intravenous Q M,W,F-HD  . feeding supplement (NEPRO CARB STEADY)  237 mL Oral BID BM  . gabapentin  300 mg Oral TID  . heparin  5,000 Units Subcutaneous Q8H  . insulin aspart  0-9 Units Subcutaneous TID WC  . metoprolol succinate  25 mg Oral QPM  . multivitamin  1 tablet Oral QHS  . rosuvastatin  20 mg Oral  QHS     Assessment/ Plan:  66 y.o. 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   ESRD - Dialysis today  [place], [facility] Nephrology - Patient ill continue his dialysis as outpatient  Anemia of CKD - EPO to be given at HD  Secondary Hyperparathyroidism - Will monitor  - Continue cinacalcet  Cdiff Colitis - treatment as primary team.  - Cdiff antigen result was negative     LOS: 1 [facility] 9/13/201812:53 PM  [facility] [place], [place]

## 2017-04-16 DIAGNOSIS — I82210 Acute embolism and thrombosis of superior vena cava: Secondary | ICD-10-CM | POA: Insufficient documentation

## 2017-05-07 ENCOUNTER — Encounter: Payer: Self-pay | Admitting: Emergency Medicine

## 2017-05-07 ENCOUNTER — Inpatient Hospital Stay
Admission: EM | Admit: 2017-05-07 | Discharge: 2017-05-10 | DRG: 377 | Disposition: A | Discharge status: Home Health | Payer: Medicare HMO | Attending: Internal Medicine | Admitting: Internal Medicine

## 2017-05-07 ENCOUNTER — Other Ambulatory Visit: Payer: Self-pay

## 2017-05-07 ENCOUNTER — Inpatient Hospital Stay: Payer: Medicare HMO

## 2017-05-07 DIAGNOSIS — E1140 Type 2 diabetes mellitus with diabetic neuropathy, unspecified: Secondary | ICD-10-CM | POA: Diagnosis present

## 2017-05-07 DIAGNOSIS — Z794 Long term (current) use of insulin: Secondary | ICD-10-CM | POA: Diagnosis not present

## 2017-05-07 DIAGNOSIS — Z87891 Personal history of nicotine dependence: Secondary | ICD-10-CM

## 2017-05-07 DIAGNOSIS — Z7982 Long term (current) use of aspirin: Secondary | ICD-10-CM

## 2017-05-07 DIAGNOSIS — D123 Benign neoplasm of transverse colon: Secondary | ICD-10-CM | POA: Diagnosis present

## 2017-05-07 DIAGNOSIS — E210 Primary hyperparathyroidism: Secondary | ICD-10-CM | POA: Diagnosis present

## 2017-05-07 DIAGNOSIS — E43 Unspecified severe protein-calorie malnutrition: Secondary | ICD-10-CM | POA: Diagnosis present

## 2017-05-07 DIAGNOSIS — I120 Hypertensive chronic kidney disease with stage 5 chronic kidney disease or end stage renal disease: Secondary | ICD-10-CM | POA: Diagnosis present

## 2017-05-07 DIAGNOSIS — D120 Benign neoplasm of cecum: Secondary | ICD-10-CM | POA: Diagnosis present

## 2017-05-07 DIAGNOSIS — Z992 Dependence on renal dialysis: Secondary | ICD-10-CM | POA: Diagnosis not present

## 2017-05-07 DIAGNOSIS — Z888 Allergy status to other drugs, medicaments and biological substances status: Secondary | ICD-10-CM

## 2017-05-07 DIAGNOSIS — E1122 Type 2 diabetes mellitus with diabetic chronic kidney disease: Secondary | ICD-10-CM | POA: Diagnosis present

## 2017-05-07 DIAGNOSIS — Z955 Presence of coronary angioplasty implant and graft: Secondary | ICD-10-CM

## 2017-05-07 DIAGNOSIS — K922 Gastrointestinal hemorrhage, unspecified: Secondary | ICD-10-CM | POA: Diagnosis present

## 2017-05-07 DIAGNOSIS — K642 Third degree hemorrhoids: Secondary | ICD-10-CM | POA: Diagnosis present

## 2017-05-07 DIAGNOSIS — N186 End stage renal disease: Secondary | ICD-10-CM | POA: Diagnosis present

## 2017-05-07 DIAGNOSIS — I82210 Acute embolism and thrombosis of superior vena cava: Secondary | ICD-10-CM | POA: Diagnosis present

## 2017-05-07 DIAGNOSIS — Z7901 Long term (current) use of anticoagulants: Secondary | ICD-10-CM

## 2017-05-07 DIAGNOSIS — D631 Anemia in chronic kidney disease: Secondary | ICD-10-CM | POA: Diagnosis present

## 2017-05-07 DIAGNOSIS — D509 Iron deficiency anemia, unspecified: Secondary | ICD-10-CM | POA: Diagnosis present

## 2017-05-07 DIAGNOSIS — E785 Hyperlipidemia, unspecified: Secondary | ICD-10-CM | POA: Diagnosis present

## 2017-05-07 DIAGNOSIS — E1151 Type 2 diabetes mellitus with diabetic peripheral angiopathy without gangrene: Secondary | ICD-10-CM | POA: Diagnosis present

## 2017-05-07 DIAGNOSIS — I252 Old myocardial infarction: Secondary | ICD-10-CM | POA: Diagnosis not present

## 2017-05-07 DIAGNOSIS — I700 Atherosclerosis of aorta: Secondary | ICD-10-CM | POA: Diagnosis present

## 2017-05-07 DIAGNOSIS — Z8673 Personal history of transient ischemic attack (TIA), and cerebral infarction without residual deficits: Secondary | ICD-10-CM

## 2017-05-07 DIAGNOSIS — I2510 Atherosclerotic heart disease of native coronary artery without angina pectoris: Secondary | ICD-10-CM | POA: Diagnosis present

## 2017-05-07 DIAGNOSIS — N2581 Secondary hyperparathyroidism of renal origin: Secondary | ICD-10-CM | POA: Diagnosis present

## 2017-05-07 DIAGNOSIS — K31811 Angiodysplasia of stomach and duodenum with bleeding: Secondary | ICD-10-CM | POA: Diagnosis present

## 2017-05-07 LAB — CBC
HEMATOCRIT: 27.7 % — AB (ref 40.0–52.0)
HEMOGLOBIN: 9.1 g/dL — AB (ref 13.0–18.0)
MCH: 26.8 pg (ref 26.0–34.0)
MCHC: 32.6 g/dL (ref 32.0–36.0)
MCV: 82.2 fL (ref 80.0–100.0)
Platelets: 160 10*3/uL (ref 150–440)
RBC: 3.38 MIL/uL — AB (ref 4.40–5.90)
RDW: 19.3 % — ABNORMAL HIGH (ref 11.5–14.5)
WBC: 9.2 10*3/uL (ref 3.8–10.6)

## 2017-05-07 LAB — COMPREHENSIVE METABOLIC PANEL
ALBUMIN: 2.8 g/dL — AB (ref 3.5–5.0)
ALK PHOS: 485 U/L — AB (ref 38–126)
ALT: 24 U/L (ref 17–63)
ANION GAP: 16 — AB (ref 5–15)
AST: 46 U/L — ABNORMAL HIGH (ref 15–41)
BUN: 44 mg/dL — ABNORMAL HIGH (ref 6–20)
CALCIUM: 7.7 mg/dL — AB (ref 8.9–10.3)
CO2: 25 mmol/L (ref 22–32)
Chloride: 98 mmol/L — ABNORMAL LOW (ref 101–111)
Creatinine, Ser: 5.05 mg/dL — ABNORMAL HIGH (ref 0.61–1.24)
GFR calc non Af Amer: 11 mL/min — ABNORMAL LOW (ref >60)
GFR, EST AFRICAN AMERICAN: 13 mL/min — AB (ref >60)
GLUCOSE: 136 mg/dL — AB (ref 65–99)
POTASSIUM: 4.6 mmol/L (ref 3.5–5.1)
SODIUM: 139 mmol/L (ref 135–145)
Total Bilirubin: 4.8 mg/dL — ABNORMAL HIGH (ref 0.3–1.2)
Total Protein: 7.5 g/dL (ref 6.5–8.1)

## 2017-05-07 LAB — PROTIME-INR
INR: 4.19
Prothrombin Time: 40.1 seconds — ABNORMAL HIGH (ref 11.4–15.2)

## 2017-05-07 LAB — GLUCOSE, CAPILLARY
GLUCOSE-CAPILLARY: 88 mg/dL (ref 65–99)
Glucose-Capillary: 155 mg/dL — ABNORMAL HIGH (ref 65–99)

## 2017-05-07 LAB — HEMOGLOBIN
HEMOGLOBIN: 8 g/dL — AB (ref 13.0–18.0)
Hemoglobin: 8.9 g/dL — ABNORMAL LOW (ref 13.0–18.0)

## 2017-05-07 LAB — LIPASE, BLOOD: Lipase: 20 U/L (ref 11–51)

## 2017-05-07 LAB — APTT: APTT: 71 s — AB (ref 24–36)

## 2017-05-07 LAB — MRSA PCR SCREENING: MRSA BY PCR: NEGATIVE

## 2017-05-07 MED ORDER — CALCITRIOL 0.25 MCG PO CAPS
0.2500 ug | ORAL_CAPSULE | Freq: Every day | ORAL | Status: DC
  Administered 2017-05-09 – 2017-05-10 (×2): 0.25 ug via ORAL
  Filled 2017-05-07 (×3): qty 1

## 2017-05-07 MED ORDER — PROTHROMBIN COMPLEX CONC HUMAN 500 UNITS IV KIT
3711.0000 [IU] | PACK | Freq: Once | Status: DC
  Filled 2017-05-07: qty 3711

## 2017-05-07 MED ORDER — ONDANSETRON HCL 4 MG/2ML IJ SOLN: 4.0000 mg | Freq: Four times a day (QID) | INTRAMUSCULAR | Status: DC | PRN

## 2017-05-07 MED ORDER — ACETAMINOPHEN 325 MG PO TABS: 650.0000 mg | ORAL_TABLET | Freq: Four times a day (QID) | ORAL | Status: DC | PRN

## 2017-05-07 MED ORDER — DEXTROSE 5 % IV SOLN
10.0000 mg | Freq: Once | INTRAVENOUS | Status: AC
  Administered 2017-05-07: 10 mg via INTRAVENOUS
  Filled 2017-05-07: qty 1

## 2017-05-07 MED ORDER — ONDANSETRON HCL 4 MG/2ML IJ SOLN
INTRAMUSCULAR | Status: AC
  Filled 2017-05-07: qty 2

## 2017-05-07 MED ORDER — ROSUVASTATIN CALCIUM 10 MG PO TABS
20.0000 mg | ORAL_TABLET | Freq: Every day | ORAL | Status: DC
Start: 2017-05-07 — End: 2017-05-10
  Administered 2017-05-07 – 2017-05-09 (×3): 20 mg via ORAL
  Filled 2017-05-07 (×3): qty 2

## 2017-05-07 MED ORDER — SODIUM CHLORIDE 0.9 % IV SOLN
80.0000 mg | Freq: Once | INTRAVENOUS | Status: AC
  Administered 2017-05-07: 80 mg via INTRAVENOUS
  Filled 2017-05-07: qty 80

## 2017-05-07 MED ORDER — ONDANSETRON HCL 4 MG/2ML IJ SOLN
4.0000 mg | Freq: Once | INTRAMUSCULAR | Status: AC
  Administered 2017-05-07: 4 mg via INTRAVENOUS

## 2017-05-07 MED ORDER — HYDRALAZINE HCL 50 MG PO TABS
50.0000 mg | ORAL_TABLET | Freq: Two times a day (BID) | ORAL | Status: DC
  Administered 2017-05-07 – 2017-05-09 (×3): 50 mg via ORAL
  Filled 2017-05-07 (×4): qty 1

## 2017-05-07 MED ORDER — GABAPENTIN 300 MG PO CAPS
300.0000 mg | ORAL_CAPSULE | Freq: Three times a day (TID) | ORAL | Status: DC
  Administered 2017-05-07 – 2017-05-09 (×5): 300 mg via ORAL
  Filled 2017-05-07 (×5): qty 1

## 2017-05-07 MED ORDER — METOPROLOL TARTRATE 25 MG PO TABS
25.0000 mg | ORAL_TABLET | Freq: Every day | ORAL | Status: DC
  Administered 2017-05-09 – 2017-05-10 (×2): 25 mg via ORAL
  Filled 2017-05-07 (×2): qty 1

## 2017-05-07 MED ORDER — CINACALCET HCL 30 MG PO TABS
60.0000 mg | ORAL_TABLET | Freq: Every day | ORAL | Status: DC
  Administered 2017-05-09 – 2017-05-10 (×2): 60 mg via ORAL
  Filled 2017-05-07 (×3): qty 2

## 2017-05-07 MED ORDER — INSULIN ASPART 100 UNIT/ML ~~LOC~~ SOLN
0.0000 [IU] | Freq: Three times a day (TID) | SUBCUTANEOUS | Status: DC
  Administered 2017-05-07: 2 [IU] via SUBCUTANEOUS
  Administered 2017-05-10: 1 [IU] via SUBCUTANEOUS
  Administered 2017-05-10: 2 [IU] via SUBCUTANEOUS
  Filled 2017-05-07 (×3): qty 1

## 2017-05-07 MED ORDER — ONDANSETRON HCL 4 MG PO TABS: 4.0000 mg | ORAL_TABLET | Freq: Four times a day (QID) | ORAL | Status: DC | PRN

## 2017-05-07 MED ORDER — INSULIN ASPART 100 UNIT/ML ~~LOC~~ SOLN: 0.0000 [IU] | Freq: Every day | SUBCUTANEOUS | Status: DC

## 2017-05-07 MED ORDER — ACETAMINOPHEN 650 MG RE SUPP: 650.0000 mg | Freq: Four times a day (QID) | RECTAL | Status: DC | PRN

## 2017-05-07 MED ORDER — BENZONATATE 100 MG PO CAPS
100.0000 mg | ORAL_CAPSULE | Freq: Three times a day (TID) | ORAL | Status: DC | PRN
Start: 2017-05-07 — End: 2017-05-10
  Administered 2017-05-07 – 2017-05-08 (×4): 100 mg via ORAL
  Filled 2017-05-07 (×4): qty 1

## 2017-05-07 NOTE — Progress Notes (Signed)
[facility]  ROUNDING NOTE   Subjective:   [name] admitted to [facility] on 05/07/2017 for Upper GI bleed [K92.2]  Last hemodialysis treatment was yesterday.   Objective:  Vital signs in last 24 hours:  Temp:  [99 F (37.2 C)] 99 F (37.2 C) (11/27 1324) Pulse Rate:  [83-87] 85 (11/27 1324) Resp:  [16-20] 16 (11/27 1324) BP: (137-159)/(69-90) 145/78 (11/27 1324) SpO2:  [98 %-100 %] 100 % (11/27 1324) Weight:  [96.6 kg (213 lb)] 96.6 kg (213 lb) (11/27 1036)  Weight change:  Filed Weights   05/07/17 1036  Weight: 96.6 kg (213 lb)    Intake/Output: No intake/output data recorded.   Intake/Output this shift:  No intake/output data recorded.  Physical Exam: General: NAD,   Head: Normocephalic, atraumatic. Moist oral mucosal membranes  Eyes: Anicteric, PERRL  Neck: Supple, trachea midline  Lungs:  Clear to auscultation  Heart: Regular rate and rhythm  Abdomen:  Soft, nontender,   Extremities: no peripheral edema.  Neurologic: Nonfocal, moving all four extremities  Skin: No lesions  Access: Left AVF    Basic Metabolic Panel: Recent Labs  Lab 05/07/17 1042  NA 139  K 4.6  CL 98*  CO2 25  GLUCOSE 136*  BUN 44*  CREATININE 5.05*  CALCIUM 7.7*    Liver Function Tests: Recent Labs  Lab 05/07/17 1042  AST 46*  ALT 24  ALKPHOS 485*  BILITOT 4.8*  PROT 7.5  ALBUMIN 2.8*   Recent Labs  Lab 05/07/17 1042  LIPASE 20   No results for input(s): AMMONIA in the last 168 hours.  CBC: Recent Labs  Lab 05/07/17 1042 05/07/17 1408  WBC 9.2  --   HGB 9.1* 8.9*  HCT 27.7*  --   MCV 82.2  --   PLT 160  --     Cardiac Enzymes: No results for input(s): CKTOTAL, CKMB, CKMBINDEX, TROPONINI in the last 168 hours.  BNP: Invalid input(s): POCBNP  CBG: No results for input(s): GLUCAP in the last 168 hours.  Microbiology: Results for orders placed or performed during the hospital encounter of 02/19/17  C difficile quick scan w PCR  reflex     Status: None   Collection Time: 02/20/17  1:34 AM  Result Value Ref Range Status   C Diff antigen NEGATIVE NEGATIVE Final   C Diff toxin NEGATIVE NEGATIVE Final   C Diff interpretation No C. difficile detected.  Final    Coagulation Studies: Recent Labs    05/07/17 1042  LABPROT 40.1*  INR 4.19*    Urinalysis: No results for input(s): COLORURINE, LABSPEC, PHURINE, GLUCOSEU, HGBUR, BILIRUBINUR, KETONESUR, PROTEINUR, UROBILINOGEN, NITRITE, LEUKOCYTESUR in the last 72 hours.  Invalid input(s): APPERANCEUR    Imaging: Dg Chest 2 View  Result Date: 05/07/2017 CLINICAL DATA:  Vomiting. EXAM: CHEST  2 VIEW COMPARISON:  Radiograph of February 20, 2017. FINDINGS: Stable cardiomegaly with central pulmonary vascular congestion is noted. Status post coronary artery bypass graft. No pneumothorax or pleural effusion is noted. No consolidative process is noted. Bony thorax is unremarkable. IMPRESSION: Stable cardiomegaly with central pulmonary vascular congestion. Electronically Signed   By: Marijo Conception, M.D.   On: 05/07/2017 13:50     Medications:    . [START ON 05/08/2017] calcitRIOL  0.25 mcg Oral Daily  . [START ON 05/08/2017] cinacalcet  60 mg Oral Daily  . gabapentin  300 mg Oral TID  . hydrALAZINE  50 mg Oral BID  . insulin aspart  0-5  Units Subcutaneous QHS  . insulin aspart  0-9 Units Subcutaneous TID WC  . [START ON 05/08/2017] metoprolol tartrate  25 mg Oral Daily  . rosuvastatin  20 mg Oral QHS   acetaminophen **OR** acetaminophen, benzonatate, ondansetron **OR** ondansetron (ZOFRAN) IV  Assessment/ Plan:  [name] is a 65 y.o. black male  With end stage renal disease on hemodialysis, hypertension, coronary artery disease status post CABG, TIA/CVA, diabetes mellitus insulin dependent, peripheral vascular disease  MWF [facility] Left AVF  1. End Stage Renal Disease: MWF  - dialysis for tomorrow.   2. Hypertension: blood  pressure at goal.  - hydralazine and metoprolol ordered.   3. Anemia with chronic kidney disease and GI bleed. Hemoglobin improved to 8.9.  - mircera as outpatient  - Appreciate GI input.   4. Secondary Hyperparathyroidism:  - cinacalcet, calcitriol ordered. Not currently on binders.    LOS: 0 Rajohn Henery 11/27/20183:22 PM

## 2017-05-07 NOTE — Consult Note (Signed)
GI Inpatient Consult Note  Reason for Consult: Coffee-ground emesis   Attending Requesting Consult: Dr. Verdell Carmine  History of Present Illness: Terry Tyler is a 66 y.o. male with a known history of ESRD on hemodialysis (M/W/F), history of MI s/p CABG, history of TIA, CAD, PVD, hypertension, hyperlipidemia, primary hyperparathyroidism, chronic liver disease, and recurrent C difficile infection admitted for evaluation of coffee-ground emesis.  Terry Tyler states he was in his usual state of health when he awoke from sleep feeling short of breath. He shortly began vomiting coffee-ground emesis x 3, as was as passing loose black stools x 3-4 episodes.  He denies associated epigastric pain.  Prior to going to bed, he experienced an episode of nausea/vomiting after dinner; his family at bedside states he has experienced this for 6 months, since beginning dialysis.  He endorses "social" alcohol use, but denies NSAID use.  No frank blood in stool or emesis. He is experiencing a non-productive cough when lying back, but currently denies chest pain or shortness of breath.  Also no dizziness or lightheadedness.  On admission, labs were notable for Hgb 9.1, Hct 27.7, BUN 44, Cr 5.05, albumin 2.8, AST 46, ALP 485, total bilirubin 4.8, GFR 13, INR 4.19, PT 40.1, aPTT 71.  He received one dose of IV pantoprazole in the ED.  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  Summary of evaluation reviewed from discharge note is as follows:  "Hyperbilirubinemia  Jaundice Cholestatic picture w/ bili elevation. Imaging concerning for chronic liver disease. Lower concern for liver disease 2/2 malignancy hepatology. Differential includes NAFLD, biliary obstruction due to stone, PBC, PSC.DILI less likely. Unable to obtain ascitic fluid. Hep A, Hep B, Hep C, ANA, ASMA, and AMA were all negative. Cancer Antigen  19-9 was significantly elevated 1,160. MRCP without contrast shows chronic liver disease but otherwise unremarkable. Echo shows moderate left ventricular hypertrophy with EF of 25-30%, grade II diastolic dysfunction, aortic sclerosis, and elevated pulmonary artery systolic pressure. Liver biopsy was obtained and found patchy venoocclusive disease with accompanied by centrilobular cholestasis and marked ductular reaction with biliary fibrosis. Biopsy results will be discussed in hepatology pathology conference."    He was discharged on Coumadin, and Plavix was discontinued.  Past Medical History:  Past Medical History:  Diagnosis Date  . C. difficile diarrhea   . CKD (chronic kidney disease)   . Coronary atherosclerosis of unspecified type of vessel, native or graft    Myocardial infarction in 2006. Cardiac catheterization showed significant three-vessel coronary artery disease. He underwent CABG at [facility]. Most recent cardiac catheterization in 2010 showed normal LV systolic function, patent grafts to OM1, RCA and LAD. Occluded SVG to diagonal.  . 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 Dialysis patient [facility])    Mon. Wed. Fri. for dialysis  . Hyperlipidemia   . Hypertension   . Hypertension, renal disease   . Impotence of organic origin   . Keloid scar   . Myocardial infarction ([place]) 2006  . Neoplasm of uncertain behavior, site unspecified   . Peripheral vascular disease, unspecified ([place])   . Proteinuria   . Stroke [facility]) 04/2016   TIA  . Unspecified vitamin D deficiency     Problem List: Patient Active Problem List   Diagnosis Date Noted  . GI bleed 05/07/2017  . Diarrhea 02/20/2017  . C. difficile diarrhea 02/19/2017  .  ESRD on dialysis ([place]) 10/16/2016  . Elevated troponin 09/08/2016  . CVA (cerebral vascular accident) ([place]) 04/09/2016  . PAD (peripheral artery disease) ([place]) 04/10/2012  . Coronary atherosclerosis of unspecified type of vessel, native or  graft   . Hyperlipidemia   . Hypertension     Past Surgical History: Past Surgical History:  Procedure Laterality Date  . A/V FISTULAGRAM Left 01/03/2017   Procedure: A/V Fistulagram;  Surgeon: Algernon Huxley, MD;  Location: [place] [facility];  Service: Cardiovascular;  Laterality: Left;  . A/V SHUNT INTERVENTION N/A 01/03/2017   Procedure: A/V Shunt Intervention;  Surgeon: Algernon Huxley, MD;  Location: [place] [facility];  Service: Cardiovascular;  Laterality: N/A;  . AV FISTULA PLACEMENT Left 10/25/2016   Procedure: ARTERIOVENOUS (AV) FISTULA CREATION ( RADIOCEPHALIC );  Surgeon: Algernon Huxley, MD;  Location: [facility];  Service: Vascular;  Laterality: Left;  . CARDIAC CATHETERIZATION  12/2004   [facility]  . CARDIAC CATHETERIZATION  09/2008   [facility]  . CORONARY ANGIOPLASTY    . CORONARY ARTERY BYPASS GRAFT  2006   [facility]  . CORONARY STENT INTERVENTION N/A 09/10/2016   Procedure: Coronary Stent Intervention;  Surgeon: Isaias Cowman, MD;  Location: [place] [facility];  Service: Cardiovascular;  Laterality: N/A;  . DIALYSIS/PERMA CATHETER REMOVAL N/A 02/14/2017   Procedure: DIALYSIS/PERMA CATHETER REMOVAL;  Surgeon: Algernon Huxley, MD;  Location: [place] [facility];  Service: Cardiovascular;  Laterality: N/A;  . keloid removal     right neck  . LEFT HEART CATH AND CORONARY ANGIOGRAPHY N/A 09/10/2016   Procedure: Left Heart Cath and Coronary Angiography;  Surgeon: Isaias Cowman, MD;  Location: [place] [facility];  Service: Cardiovascular;  Laterality: N/A;    Allergies: Allergies  Allergen Reactions  . Ezetimibe Other (See Comments)    Cannot remember what the allergy was.  . Lipitor [Atorvastatin] Other (See Comments)    Muscle cramping    Home Medications: Medications Prior to Admission  Medication Sig Dispense Refill Last Dose  . aspirin EC 81 MG tablet Take 81 mg by mouth every evening.   05/07/2017 at 0800  . calcitRIOL (ROCALTROL) 0.25 MCG capsule Take 0.25  mcg by mouth daily.   05/07/2017 at 0800  . cinacalcet (SENSIPAR) 60 MG tablet Take 60 mg by mouth daily.   05/07/2017 at 0800  . gabapentin (NEURONTIN) 300 MG capsule Take 300 mg by mouth 3 (three) times daily.   05/07/2017 at 0800  . hydrALAZINE (APRESOLINE) 50 MG tablet Take 50 mg by mouth 2 (two) times daily.    05/07/2017 at 0800  . metoprolol tartrate (LOPRESSOR) 25 MG tablet Take 25 mg by mouth daily.   05/07/2017 at 0800  . rosuvastatin (CRESTOR) 20 MG tablet Take 20 mg by mouth at bedtime.    05/07/2017 at 0800  . warfarin (COUMADIN) 4 MG tablet Take 4 mg by mouth daily.   05/06/2017 at 1800  . insulin glargine (LANTUS) 100 UNIT/ML injection Inject 0.25 mLs (25 Units total) into the skin at bedtime. Takes 80 units at bedtimed;Titrate 4 units every 4 days unitl FBG are in the 130s. (Patient not taking: Reported on 05/07/2017) 10 mL 11 Not Taking at Unknown time   Home medication reconciliation was completed with the patient.   Scheduled Inpatient Medications:   . [START ON 05/08/2017] calcitRIOL  0.25 mcg Oral Daily  . [START ON 05/08/2017] cinacalcet  60 mg Oral Daily  . gabapentin  300 mg Oral TID  .  hydrALAZINE  50 mg Oral BID  . insulin aspart  0-5 Units Subcutaneous QHS  . insulin aspart  0-9 Units Subcutaneous TID WC  . [START ON 05/08/2017] metoprolol tartrate  25 mg Oral Daily  . rosuvastatin  20 mg Oral QHS    Continuous Inpatient Infusions:    PRN Inpatient Medications:  acetaminophen **OR** acetaminophen, benzonatate, ondansetron **OR** ondansetron (ZOFRAN) IV  Family History: family history includes Diabetes in his brother and sister; HIV/AIDS in his brother; Heart disease in his father and mother; Hyperlipidemia in his father and mother; Hypertension in his brother, father, mother, and sister; Kidney disease in his sister.   Social History:   reports that he quit smoking about 36 years ago. His smoking use included cigarettes. He smoked 0.50 packs per day for  0.00 years. he has never used smokeless tobacco. He reports that he does not drink alcohol or use drugs.  Review of Systems: Constitutional: Weight is stable.  Eyes: No changes in vision. ENT: No oral lesions, sore throat.  GI: see HPI.  Heme/Lymph: No easy bruising.  CV: No chest pain.  GU: No hematuria.  Integumentary: No rashes.  Neuro: No headaches.  Psych: No depression/anxiety.  Endocrine: No heat/cold intolerance.  Allergic/Immunologic: No urticaria.  Resp: No cough, SOB.  Musculoskeletal: No joint swelling.    Physical Examination: BP (!) 145/78 (BP Location: Right Arm)   Pulse 85   Temp 99 F (37.2 C) (Oral)   Resp 16   Ht 6' (1.829 m)   Wt 96.6 kg (213 lb)   SpO2 100%   BMI 28.89 kg/m  Gen: NAD, alert and oriented x 4 HEENT: PEERLA, EOMI, Neck: supple, no JVD or thyromegaly Chest: CTA bilaterally, no wheezes, crackles, or other adventitious sounds CV: RRR, no m/g/c/r Abd: protuberant abdomen with moderate ascites, soft, nontender, +BS in all four quadrants; no HSM, guarding, ridigity, or rebound tenderness Ext: 1 + edema on leg lower extremity, well perfused with 2+ pulses Skin: no rash or lesions noted Lymph: no LAD  Data: Lab Results  Component Value Date   WBC 9.2 05/07/2017   HGB 8.9 (L) 05/07/2017   HCT 27.7 (L) 05/07/2017   MCV 82.2 05/07/2017   PLT 160 05/07/2017   Recent Labs  Lab 05/07/17 1042 05/07/17 1408  HGB 9.1* 8.9*   Lab Results  Component Value Date   NA 139 05/07/2017   K 4.6 05/07/2017   CL 98 (L) 05/07/2017   CO2 25 05/07/2017   BUN 44 (H) 05/07/2017   CREATININE 5.05 (H) 05/07/2017   Lab Results  Component Value Date   ALT 24 05/07/2017   AST 46 (H) 05/07/2017   ALKPHOS 485 (H) 05/07/2017   BILITOT 4.8 (H) 05/07/2017   Recent Labs  Lab 05/07/17 1042  APTT 71*  INR 4.19*   Assessment/Plan: Terry Tyler is a 66 y.o. male history of ESRD on hemodialysis (M/W/F), history of MI s/p CABG, history of TIA, CAD, PVD,  hypertension, hyperlipidemia, primary hyperparathyroidism, chronic liver disease, and recurrent C difficile infection admitted with coffee-ground emesis.  Hemoglobin on admission was stable around his usual baseline at 9.1.  BUN was mildly increased from baseline at 44.  INR, PT, and aPTT were also elevated in the setting of recent coumadin initiation.  Serial Hgb 1 hour ago was 8.9, and patient remains hemodynamically stable. He denies further episodes of CGE or black tarry stools since this morning.  At this time, differential diagnosis for upper GI bleeding includes esophageal  varices secondary to liver disease of unknown etiology versus gastritis/duodenitis/PUD versus gastric AVMs.  Gastritis/duodenitis/PUD may also account for patient's postprandial nausea/vomiting x 6 months.  Case discussed with Dr. Alice Reichert in coordination of care.  Recommendations are as follows.  Recommendations:   - Continue to monitor Hgb q 4 hours. Transfuse if < 7g/dL. - Clear liquid diet tonight, then NPO after midnight. - EGD tentatively planned for tomorrow. Further coordination per Dr. Alice Reichert. - Resume IV pantoprazole 40 mg q 12 hours for empiric treatment. - Advised patient to continue avoiding NSAIDs and EtOH.  Thank you for the consult. We will follow along with you. Please call with questions or concerns.  Lavera Guise, PA-C [facility] Phone: [phone] Pager: [phone]  Attending Physician Attestation:  I have obtained an interval history from the patient, reviewed the chart and performed a physical examination. The Advanced Practitioner's note, clinical impression and recommendations have been formulated with my input and I agree with the assessment and plan as outlined above.  Thank you for allowing [place] to be involved in this patient's care.  Please call me if you have any questions.   Robet Leu, M.D. [facility]  [phone] -  Office [phone]- Cell phone

## 2017-05-07 NOTE — ED Notes (Signed)
Family asked if pt could have ice water. Per Dr. Burlene Arnt pt to remain NPO

## 2017-05-07 NOTE — ED Notes (Signed)
IV team at bedside 

## 2017-05-07 NOTE — ED Triage Notes (Signed)
Pt to ED via EMS from home with c/o vomiting and diarrhea since last night. Per EMS fire dept noted coffee ground emesis in bathroom. Pt states blood in emesis, recently started coumadin x2wks .Pt is dialysis pt , last dialyzed yesterday. Pt A&Ox4, VS stable

## 2017-05-07 NOTE — ED Notes (Signed)
Attempted a second IV per Dr. Burlene Arnt request. This RN did not find any veins to hold an IV.

## 2017-05-07 NOTE — H&P (View-Only) (Signed)
GI Inpatient Consult Note  Reason for Consult: Coffee-ground emesis   Attending Requesting Consult: Dr. Verdell Carmine  History of Present Illness: Terry Tyler is a 66 y.o. male with a known history of ESRD on hemodialysis (M/W/F), history of MI s/p CABG, history of TIA, CAD, PVD, hypertension, hyperlipidemia, primary hyperparathyroidism, chronic liver disease, and recurrent C difficile infection admitted for evaluation of coffee-ground emesis.  Terry Tyler states he was in his usual state of health when he awoke from sleep feeling short of breath. He shortly began vomiting coffee-ground emesis x 3, as was as passing loose black stools x 3-4 episodes.  He denies associated epigastric pain.  Prior to going to bed, he experienced an episode of nausea/vomiting after dinner; his family at bedside states he has experienced this for 6 months, since beginning dialysis.  He endorses "social" alcohol use, but denies NSAID use.  No frank blood in stool or emesis. He is experiencing a non-productive cough when lying back, but currently denies chest pain or shortness of breath.  Also no dizziness or lightheadedness.  On admission, labs were notable for Hgb 9.1, Hct 27.7, BUN 44, Cr 5.05, albumin 2.8, AST 46, ALP 485, total bilirubin 4.8, GFR 13, INR 4.19, PT 40.1, aPTT 71.  He received one dose of IV pantoprazole in the ED.  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  Summary of evaluation reviewed from discharge note is as follows:  "Hyperbilirubinemia  Jaundice Cholestatic picture w/ bili elevation. Imaging concerning for chronic liver disease. Lower concern for liver disease 2/2 malignancy hepatology. Differential includes NAFLD, biliary obstruction due to stone, PBC, PSC.DILI less likely. Unable to obtain ascitic fluid. Hep A, Hep B, Hep C, ANA, ASMA, and AMA were all negative. Cancer Antigen  19-9 was significantly elevated 1,160. MRCP without contrast shows chronic liver disease but otherwise unremarkable. Echo shows moderate left ventricular hypertrophy with EF of 25-30%, grade II diastolic dysfunction, aortic sclerosis, and elevated pulmonary artery systolic pressure. Liver biopsy was obtained and found patchy venoocclusive disease with accompanied by centrilobular cholestasis and marked ductular reaction with biliary fibrosis. Biopsy results will be discussed in hepatology pathology conference."    He was discharged on Coumadin, and Plavix was discontinued.  Past Medical History:  Past Medical History:  Diagnosis Date  . C. difficile diarrhea   . CKD (chronic kidney disease)   . Coronary atherosclerosis of unspecified type of vessel, native or graft    Myocardial infarction in 2006. Cardiac catheterization showed significant three-vessel coronary artery disease. He underwent CABG at [facility]. Most recent cardiac catheterization in 2010 showed normal LV systolic function, patent grafts to OM1, RCA and LAD. Occluded SVG to diagonal.  . 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 Dialysis patient [facility])    Mon. Wed. Fri. for dialysis  . Hyperlipidemia   . Hypertension   . Hypertension, renal disease   . Impotence of organic origin   . Keloid scar   . Myocardial infarction ([place]) 2006  . Neoplasm of uncertain behavior, site unspecified   . Peripheral vascular disease, unspecified ([place])   . Proteinuria   . Stroke [facility]) 04/2016   TIA  . Unspecified vitamin D deficiency     Problem List: Patient Active Problem List   Diagnosis Date Noted  . GI bleed 05/07/2017  . Diarrhea 02/20/2017  . C. difficile diarrhea 02/19/2017  .  ESRD on dialysis ([place]) 10/16/2016  . Elevated troponin 09/08/2016  . CVA (cerebral vascular accident) ([place]) 04/09/2016  . PAD (peripheral artery disease) ([place]) 04/10/2012  . Coronary atherosclerosis of unspecified type of vessel, native or  graft   . Hyperlipidemia   . Hypertension     Past Surgical History: Past Surgical History:  Procedure Laterality Date  . A/V FISTULAGRAM Left 01/03/2017   Procedure: A/V Fistulagram;  Surgeon: Algernon Huxley, MD;  Location: [place] CV LAB;  Service: Cardiovascular;  Laterality: Left;  . A/V SHUNT INTERVENTION N/A 01/03/2017   Procedure: A/V Shunt Intervention;  Surgeon: Algernon Huxley, MD;  Location: [place] CV LAB;  Service: Cardiovascular;  Laterality: N/A;  . AV FISTULA PLACEMENT Left 10/25/2016   Procedure: ARTERIOVENOUS (AV) FISTULA CREATION ( RADIOCEPHALIC );  Surgeon: Algernon Huxley, MD;  Location: [facility] ORS;  Service: Vascular;  Laterality: Left;  . CARDIAC CATHETERIZATION  12/2004   [facility]  . CARDIAC CATHETERIZATION  09/2008   [facility]  . CORONARY ANGIOPLASTY    . CORONARY ARTERY BYPASS GRAFT  2006   [facility]  . CORONARY STENT INTERVENTION N/A 09/10/2016   Procedure: Coronary Stent Intervention;  Surgeon: Isaias Cowman, MD;  Location: [place] CV LAB;  Service: Cardiovascular;  Laterality: N/A;  . DIALYSIS/PERMA CATHETER REMOVAL N/A 02/14/2017   Procedure: DIALYSIS/PERMA CATHETER REMOVAL;  Surgeon: Algernon Huxley, MD;  Location: [place] CV LAB;  Service: Cardiovascular;  Laterality: N/A;  . keloid removal     right neck  . LEFT HEART CATH AND CORONARY ANGIOGRAPHY N/A 09/10/2016   Procedure: Left Heart Cath and Coronary Angiography;  Surgeon: Isaias Cowman, MD;  Location: [place] CV LAB;  Service: Cardiovascular;  Laterality: N/A;    Allergies: Allergies  Allergen Reactions  . Ezetimibe Other (See Comments)    Cannot remember what the allergy was.  . Lipitor [Atorvastatin] Other (See Comments)    Muscle cramping    Home Medications: Medications Prior to Admission  Medication Sig Dispense Refill Last Dose  . aspirin EC 81 MG tablet Take 81 mg by mouth every evening.   05/07/2017 at 0800  . calcitRIOL (ROCALTROL) 0.25 MCG capsule Take 0.25  mcg by mouth daily.   05/07/2017 at 0800  . cinacalcet (SENSIPAR) 60 MG tablet Take 60 mg by mouth daily.   05/07/2017 at 0800  . gabapentin (NEURONTIN) 300 MG capsule Take 300 mg by mouth 3 (three) times daily.   05/07/2017 at 0800  . hydrALAZINE (APRESOLINE) 50 MG tablet Take 50 mg by mouth 2 (two) times daily.    05/07/2017 at 0800  . metoprolol tartrate (LOPRESSOR) 25 MG tablet Take 25 mg by mouth daily.   05/07/2017 at 0800  . rosuvastatin (CRESTOR) 20 MG tablet Take 20 mg by mouth at bedtime.    05/07/2017 at 0800  . warfarin (COUMADIN) 4 MG tablet Take 4 mg by mouth daily.   05/06/2017 at 1800  . insulin glargine (LANTUS) 100 UNIT/ML injection Inject 0.25 mLs (25 Units total) into the skin at bedtime. Takes 80 units at bedtimed;Titrate 4 units every 4 days unitl FBG are in the 130s. (Patient not taking: Reported on 05/07/2017) 10 mL 11 Not Taking at Unknown time   Home medication reconciliation was completed with the patient.   Scheduled Inpatient Medications:   . [START ON 05/08/2017] calcitRIOL  0.25 mcg Oral Daily  . [START ON 05/08/2017] cinacalcet  60 mg Oral Daily  . gabapentin  300 mg Oral TID  .  hydrALAZINE  50 mg Oral BID  . insulin aspart  0-5 Units Subcutaneous QHS  . insulin aspart  0-9 Units Subcutaneous TID WC  . [START ON 05/08/2017] metoprolol tartrate  25 mg Oral Daily  . rosuvastatin  20 mg Oral QHS    Continuous Inpatient Infusions:    PRN Inpatient Medications:  acetaminophen **OR** acetaminophen, benzonatate, ondansetron **OR** ondansetron (ZOFRAN) IV  Family History: family history includes Diabetes in his brother and sister; HIV/AIDS in his brother; Heart disease in his father and mother; Hyperlipidemia in his father and mother; Hypertension in his brother, father, mother, and sister; Kidney disease in his sister.   Social History:   reports that he quit smoking about 36 years ago. His smoking use included cigarettes. He smoked 0.50 packs per day for  0.00 years. he has never used smokeless tobacco. He reports that he does not drink alcohol or use drugs.  Review of Systems: Constitutional: Weight is stable.  Eyes: No changes in vision. ENT: No oral lesions, sore throat.  GI: see HPI.  Heme/Lymph: No easy bruising.  CV: No chest pain.  GU: No hematuria.  Integumentary: No rashes.  Neuro: No headaches.  Psych: No depression/anxiety.  Endocrine: No heat/cold intolerance.  Allergic/Immunologic: No urticaria.  Resp: No cough, SOB.  Musculoskeletal: No joint swelling.    Physical Examination: BP (!) 145/78 (BP Location: Right Arm)   Pulse 85   Temp 99 F (37.2 C) (Oral)   Resp 16   Ht 6' (1.829 m)   Wt 96.6 kg (213 lb)   SpO2 100%   BMI 28.89 kg/m  Gen: NAD, alert and oriented x 4 HEENT: PEERLA, EOMI, Neck: supple, no JVD or thyromegaly Chest: CTA bilaterally, no wheezes, crackles, or other adventitious sounds CV: RRR, no m/g/c/r Abd: protuberant abdomen with moderate ascites, soft, nontender, +BS in all four quadrants; no HSM, guarding, ridigity, or rebound tenderness Ext: 1 + edema on leg lower extremity, well perfused with 2+ pulses Skin: no rash or lesions noted Lymph: no LAD  Data: Lab Results  Component Value Date   WBC 9.2 05/07/2017   HGB 8.9 (L) 05/07/2017   HCT 27.7 (L) 05/07/2017   MCV 82.2 05/07/2017   PLT 160 05/07/2017   Recent Labs  Lab 05/07/17 1042 05/07/17 1408  HGB 9.1* 8.9*   Lab Results  Component Value Date   NA 139 05/07/2017   K 4.6 05/07/2017   CL 98 (L) 05/07/2017   CO2 25 05/07/2017   BUN 44 (H) 05/07/2017   CREATININE 5.05 (H) 05/07/2017   Lab Results  Component Value Date   ALT 24 05/07/2017   AST 46 (H) 05/07/2017   ALKPHOS 485 (H) 05/07/2017   BILITOT 4.8 (H) 05/07/2017   Recent Labs  Lab 05/07/17 1042  APTT 71*  INR 4.19*   Assessment/Plan: Terry Tyler is a 67 y.o. male history of ESRD on hemodialysis (M/W/F), history of MI s/p CABG, history of TIA, CAD, PVD,  hypertension, hyperlipidemia, primary hyperparathyroidism, chronic liver disease, and recurrent C difficile infection admitted with coffee-ground emesis.  Hemoglobin on admission was stable around his usual baseline at 9.1.  BUN was mildly increased from baseline at 44.  INR, PT, and aPTT were also elevated in the setting of recent coumadin initiation.  Serial Hgb 1 hour ago was 8.9, and patient remains hemodynamically stable. He denies further episodes of CGE or black tarry stools since this morning.  At this time, differential diagnosis for upper GI bleeding includes esophageal  varices secondary to liver disease of unknown etiology versus gastritis/duodenitis/PUD versus gastric AVMs.  Gastritis/duodenitis/PUD may also account for patient's postprandial nausea/vomiting x 6 months.  Case discussed with Dr. Alice Reichert in coordination of care.  Recommendations are as follows.  Recommendations:   - Continue to monitor Hgb q 4 hours. Transfuse if < 7g/dL. - Clear liquid diet tonight, then NPO after midnight. - EGD tentatively planned for tomorrow. Further coordination per Dr. Alice Reichert. - Resume IV pantoprazole 40 mg q 12 hours for empiric treatment. - Advised patient to continue avoiding NSAIDs and EtOH.  Thank you for the consult. We will follow along with you. Please call with questions or concerns.  Lavera Guise, PA-C [facility] Phone: [phone] Pager: [phone]  Attending Physician Attestation:  I have obtained an interval history from the patient, reviewed the chart and performed a physical examination. The Advanced Practitioner's note, clinical impression and recommendations have been formulated with my input and I agree with the assessment and plan as outlined above.  Thank you for allowing [place] to be involved in this patient's care.  Please call me if you have any questions.   Robet Leu, M.D. [facility]  [phone] -  Office [phone]- Cell phone

## 2017-05-07 NOTE — H&P (Signed)
[place] [facility] [place] NAME: Terry Tyler    MR#:  [id number]  DATE OF BIRTH:  02/06/1951  DATE OF ADMISSION:  05/07/2017  PRIMARY CARE PHYSICIAN: Marguerita Merles, MD   REQUESTING/REFERRING PHYSICIAN: Dr. Charlotte Crumb  CHIEF COMPLAINT:   Chief Complaint  Patient presents with  . Emesis    HISTORY OF PRESENT ILLNESS:  Terry Tyler  is a 66 y.o. male with a known history of end-stage renal disease on hemodialysis, history of coronary artery disease, hypertension, hyperlipidemia, secondary hyperparathyroidism, history of previous MI who presents to the hospital due to multiple episodes of coffee-ground emesis. Patient was in his usual state of health when late last night into this morning he had multiple episodes of coffee-ground emesis. He denies any abdominal pain associated with. Patient has chronic nausea and has chronic vomiting since she has been started on dialysis in etiology of this is unclear. Patient denies any chest pain, admits to some exertional shortness of breath but no other associated symptoms. His hemoglobin is currently stable but due to his multiple episodes of coffee-ground emesis and suspicion for GI bleed and hospitalist services were contacted further treatment and evaluation.  PAST MEDICAL HISTORY:   Past Medical History:  Diagnosis Date  . C. difficile diarrhea   . CKD (chronic kidney disease)   . Coronary atherosclerosis of unspecified type of vessel, native or graft    Myocardial infarction in 2006. Cardiac catheterization showed significant three-vessel coronary artery disease. He underwent CABG at [facility]. Most recent cardiac catheterization in 2010 showed normal LV systolic function, patent grafts to OM1, RCA and LAD. Occluded SVG to diagonal.  . 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 Dialysis patient [facility])    Mon. Wed. Fri. for dialysis  . Hyperlipidemia   . Hypertension   . Hypertension,  renal disease   . Impotence of organic origin   . Keloid scar   . Myocardial infarction ([place]) 2006  . Neoplasm of uncertain behavior, site unspecified   . Peripheral vascular disease, unspecified ([place])   . Proteinuria   . Stroke [facility]) 04/2016   TIA  . Unspecified vitamin D deficiency     PAST SURGICAL HISTORY:   Past Surgical History:  Procedure Laterality Date  . A/V FISTULAGRAM Left 01/03/2017   Procedure: A/V Fistulagram;  Surgeon: Algernon Huxley, MD;  Location: [place] CV LAB;  Service: Cardiovascular;  Laterality: Left;  . A/V SHUNT INTERVENTION N/A 01/03/2017   Procedure: A/V Shunt Intervention;  Surgeon: Algernon Huxley, MD;  Location: [place] CV LAB;  Service: Cardiovascular;  Laterality: N/A;  . AV FISTULA PLACEMENT Left 10/25/2016   Procedure: ARTERIOVENOUS (AV) FISTULA CREATION ( RADIOCEPHALIC );  Surgeon: Algernon Huxley, MD;  Location: [facility] ORS;  Service: Vascular;  Laterality: Left;  . CARDIAC CATHETERIZATION  12/2004   [facility]  . CARDIAC CATHETERIZATION  09/2008   [facility]  . CORONARY ANGIOPLASTY    . CORONARY ARTERY BYPASS GRAFT  2006   [facility]  . CORONARY STENT INTERVENTION N/A 09/10/2016   Procedure: Coronary Stent Intervention;  Surgeon: Isaias Cowman, MD;  Location: [place] CV LAB;  Service: Cardiovascular;  Laterality: N/A;  . DIALYSIS/PERMA CATHETER REMOVAL N/A 02/14/2017   Procedure: DIALYSIS/PERMA CATHETER REMOVAL;  Surgeon: Algernon Huxley, MD;  Location: [place] CV LAB;  Service: Cardiovascular;  Laterality: N/A;  . keloid removal     right neck  . LEFT HEART CATH  AND CORONARY ANGIOGRAPHY N/A 09/10/2016   Procedure: Left Heart Cath and Coronary Angiography;  Surgeon: Isaias Cowman, MD;  Location: [place] CV LAB;  Service: Cardiovascular;  Laterality: N/A;    SOCIAL HISTORY:   Social History   Tobacco Use  . Smoking status: Former Smoker    Packs/day: 0.50    Years: 0.00    Pack years: 0.00    Types: Cigarettes     Last attempt to quit: 06/11/1986    Years since quitting: 30.9  . Smokeless tobacco: Never Used  Substance Use Topics  . Alcohol use: No    Comment: socially    FAMILY HISTORY:   Family History  Problem Relation Age of Onset  . Hypertension Mother   . Hyperlipidemia Mother   . Heart disease Mother   . Heart disease Father   . Hyperlipidemia Father   . Hypertension Father     DRUG ALLERGIES:   Allergies  Allergen Reactions  . Ezetimibe Other (See Comments)    Cannot remember what the allergy was.  . Lipitor [Atorvastatin] Other (See Comments)    Muscle cramping    REVIEW OF SYSTEMS:   Review of Systems  Constitutional: Negative for fever and weight loss.  HENT: Negative for congestion, nosebleeds and tinnitus.   Eyes: Negative for blurred vision, double vision and redness.  Respiratory: Positive for shortness of breath. Negative for cough and hemoptysis.   Cardiovascular: Negative for chest pain, orthopnea, leg swelling and PND.  Gastrointestinal: Positive for melena, nausea and vomiting. Negative for abdominal pain and diarrhea.  Genitourinary: Negative for dysuria, hematuria and urgency.  Musculoskeletal: Negative for falls and joint pain.  Neurological: Negative for dizziness, tingling, sensory change, focal weakness, seizures, weakness and headaches.  Endo/Heme/Allergies: Negative for polydipsia. Does not bruise/bleed easily.  Psychiatric/Behavioral: Negative for depression and memory loss. The patient is not nervous/anxious.     MEDICATIONS AT HOME:   Prior to Admission medications   Medication Sig Start Date End Date Taking? Authorizing Provider  aspirin EC 81 MG tablet Take 81 mg by mouth every evening.   Yes [provider]  calcitRIOL (ROCALTROL) 0.25 MCG capsule Take 0.25 mcg by mouth daily.   Yes [provider]  cinacalcet (SENSIPAR) 60 MG tablet Take 60 mg by mouth daily.   Yes [provider]  gabapentin (NEURONTIN) 300 MG  capsule Take 300 mg by mouth 3 (three) times daily.   Yes [provider]  hydrALAZINE (APRESOLINE) 50 MG tablet Take 50 mg by mouth 2 (two) times daily.    Yes [provider]  metoprolol tartrate (LOPRESSOR) 25 MG tablet Take 25 mg by mouth daily.   Yes [provider]  rosuvastatin (CRESTOR) 20 MG tablet Take 20 mg by mouth at bedtime.    Yes [provider]  warfarin (COUMADIN) 4 MG tablet Take 4 mg by mouth daily.   Yes [provider]  insulin glargine (LANTUS) 100 UNIT/ML injection Inject 0.25 mLs (25 Units total) into the skin at bedtime. Takes 80 units at bedtimed;Titrate 4 units every 4 days unitl FBG are in the 130s. Patient not taking: Reported on 05/07/2017 04/11/16   Fritzi Mandes, MD      VITAL SIGNS:  Blood pressure 137/69, pulse 83, resp. rate 20, height 6' (1.829 m), weight 96.6 kg (213 lb), SpO2 99 %.  PHYSICAL EXAMINATION:  Physical Exam  GENERAL:  66 y.o.-year-old patient lying in the bed in no acute distress.  EYES: Pupils equal, round, reactive  to light and accommodation. No scleral icterus. Extraocular muscles intact.  HEENT: Head atraumatic, normocephalic. Oropharynx and nasopharynx clear. No oropharyngeal erythema, moist oral mucosa  NECK:  Supple, no jugular venous distention. No thyroid enlargement, no tenderness.  LUNGS: Normal breath sounds bilaterally, no wheezing, rales, rhonchi. No use of accessory muscles of respiration.  CARDIOVASCULAR: S1, S2 RRR. No murmurs, rubs, gallops, clicks.  ABDOMEN: Soft, nontender, nondistended. Bowel sounds present. No organomegaly or mass.  EXTREMITIES: No pedal edema, cyanosis, or clubbing. + 2 pedal & radial pulses b/l.   Left upper extremity fistula with good bruit and good thrill. NEUROLOGIC: Cranial nerves II through XII are intact. No focal Motor or sensory deficits appreciated . PSYCHIATRIC: The patient is alert and oriented x 3. Good affect.  SKIN: No obvious rash, lesion, or  ulcer.   LABORATORY PANEL:   CBC Recent Labs  Lab 05/07/17 1042  WBC 9.2  HGB 9.1*  HCT 27.7*  PLT 160   ------------------------------------------------------------------------------------------------------------------  Chemistries  Recent Labs  Lab 05/07/17 1042  NA 139  K 4.6  CL 98*  CO2 25  GLUCOSE 136*  BUN 44*  CREATININE 5.05*  CALCIUM 7.7*  AST 46*  ALT 24  ALKPHOS 485*  BILITOT 4.8*   ------------------------------------------------------------------------------------------------------------------  Cardiac Enzymes No results for input(s): TROPONINI in the last 168 hours. ------------------------------------------------------------------------------------------------------------------  RADIOLOGY:  No results found.   IMPRESSION AND PLAN:   66 year old male with past history of end-stage renal disease on hemodialysis, secondary hyperparathyroidism, hypertension, hyperlipidemia, history of previous MI, history of coronary artery disease presents to the hospital due to multiple episodes of coffee-ground emesis.  1. GI bleed-this is suspected to be an upper GI bleed given the patient's coffee-ground emesis. 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 -Hold aspirin, Coumadin for now. Patient received some vitamin K in the ER. -Hemoglobin currently stable and we'll follow it serially. Keep him nothing by mouth for now. We'll get a gastroenterology consult. Place him on IV Protonix.  2. End-stage renal disease on hemodialysis-patient gets dialysis on Monday Wednesday and Friday. [facility] consult nephrology.  3. Essential hypertension-continue metoprolol, hydralazine.  4. Hyperlipidemia-continue Crestor.  5. Neuropathy-continue gabapentin.  6. Diabetes type 2 without complication-we'll place him on sliding scale insulin. Hold Lantus for now.  7. Secondary hyperparathyroidism-continue Sensipar, Rocaltrol.   All the records are  reviewed and case discussed with ED provider. Management plans discussed with the patient, family and they are in agreement.  CODE STATUS: Full code  TOTAL TIME TAKING CARE OF THIS PATIENT: 45 minutes.    Henreitta Leber M.D on 05/07/2017 at 12:13 PM  Between 7am to 6pm - Pager - [phone]  After 6pm go to [email or web address] - password [email or web address]  [place] [facility]  Office  [phone]  CC: Primary care physician; Marguerita Merles, MD

## 2017-05-07 NOTE — ED Notes (Signed)
Date and time results received: 05/07/17 1130  Test: INR Critical Value: 4.1  Name of Provider Notified: Dr. Burlene Arnt

## 2017-05-07 NOTE — ED Provider Notes (Addendum)
[facility] Emergency Department Provider Note  ____________________________________________   I have reviewed the triage vital signs and the nursing notes.   HISTORY  Chief Complaint Emesis    HPI Terry Tyler is a 66 y.o. male history of end-stage renal disease, had dialysis yesterday, was started on Coumadin for reasons he does not know 2 weeks ago.  Patient states that overnight he has had multiple episodes of black emesis and black diarrhea.  Is never had this before.  Is never had an endoscopy, denies any chest pain or shortness of breath.  Fever or chills and denies any pain.  Nothing makes symptoms better nothing makes it worse, present since yesterday, cannot count how many times he had diarrhea or vomiting.  At his baseline according to family.    Past Medical History:  Diagnosis Date  . C. difficile diarrhea   . CKD (chronic kidney disease)   . Coronary atherosclerosis of unspecified type of vessel, native or graft    Myocardial infarction in 2006. Cardiac catheterization showed significant three-vessel coronary artery disease. He underwent CABG at [facility]. Most recent cardiac catheterization in 2010 showed normal LV systolic function, patent grafts to OM1, RCA and LAD. Occluded SVG to diagonal.  . 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 Dialysis patient [facility])    Mon. Wed. Fri. for dialysis  . Hyperlipidemia   . Hypertension   . Hypertension, renal disease   . Impotence of organic origin   . Keloid scar   . Myocardial infarction ([place]) 2006  . Neoplasm of uncertain behavior, site unspecified   . Peripheral vascular disease, unspecified ([place])   . Proteinuria   . Stroke [facility]) 04/2016   TIA  . Unspecified vitamin D deficiency     Patient Active Problem List   Diagnosis Date Noted  . Diarrhea 02/20/2017  . C. difficile diarrhea 02/19/2017  . ESRD on dialysis ([place]) 10/16/2016  . Elevated troponin 09/08/2016  . CVA  (cerebral vascular accident) ([place]) 04/09/2016  . PAD (peripheral artery disease) ([place]) 04/10/2012  . Coronary atherosclerosis of unspecified type of vessel, native or graft   . Hyperlipidemia   . Hypertension     Past Surgical History:  Procedure Laterality Date  . A/V FISTULAGRAM Left 01/03/2017   Procedure: A/V Fistulagram;  Surgeon: Algernon Huxley, MD;  Location: [place] CV LAB;  Service: Cardiovascular;  Laterality: Left;  . A/V SHUNT INTERVENTION N/A 01/03/2017   Procedure: A/V Shunt Intervention;  Surgeon: Algernon Huxley, MD;  Location: [place] CV LAB;  Service: Cardiovascular;  Laterality: N/A;  . AV FISTULA PLACEMENT Left 10/25/2016   Procedure: ARTERIOVENOUS (AV) FISTULA CREATION ( RADIOCEPHALIC );  Surgeon: Algernon Huxley, MD;  Location: [facility] ORS;  Service: Vascular;  Laterality: Left;  . CARDIAC CATHETERIZATION  12/2004   [facility];[place]  . CARDIAC CATHETERIZATION  09/2008   [facility];[place]  . CORONARY ANGIOPLASTY    . CORONARY ARTERY BYPASS GRAFT  2006   [facility]  . CORONARY STENT INTERVENTION N/A 09/10/2016   Procedure: Coronary Stent Intervention;  Surgeon: Isaias Cowman, MD;  Location: [place] CV LAB;  Service: Cardiovascular;  Laterality: N/A;  . DIALYSIS/PERMA CATHETER REMOVAL N/A 02/14/2017   Procedure: DIALYSIS/PERMA CATHETER REMOVAL;  Surgeon: Algernon Huxley, MD;  Location: [place] CV LAB;  Service: Cardiovascular;  Laterality: N/A;  . keloid removal     right neck  . LEFT HEART CATH AND CORONARY ANGIOGRAPHY N/A 09/10/2016   Procedure:  Left Heart Cath and Coronary Angiography;  Surgeon: Isaias Cowman, MD;  Location: [place] [facility] LAB;  Service: Cardiovascular;  Laterality: N/A;    Prior to Admission medications   Medication Sig Start Date End Date Taking? Authorizing Provider  acidophilus (RISAQUAD) CAPS capsule Take 1 capsule by mouth daily. 02/22/17   Henreitta Leber, MD  amLODipine (NORVASC) 10 MG tablet Take 10 mg by mouth daily.    [provider]  aspirin EC 81 MG tablet Take 81 mg by mouth every evening.    [provider]  clopidogrel (PLAVIX) 75 MG tablet Take 1 tablet (75 mg total) by mouth daily. Patient taking differently: Take 75 mg by mouth every evening.  04/11/16   Fritzi Mandes, MD  furosemide (LASIX) 40 MG tablet Take 40 mg by mouth daily.     [provider]  gabapentin (NEURONTIN) 300 MG capsule Take 300 mg by mouth 3 (three) times daily.    [provider]  hydrALAZINE (APRESOLINE) 50 MG tablet Take 50 mg by mouth 3 (three) times daily.     [provider]  insulin glargine (LANTUS) 100 UNIT/ML injection Inject 0.25 mLs (25 Units total) into the skin at bedtime. Takes 80 units at bedtimed;Titrate 4 units every 4 days unitl FBG are in the 130s. Patient taking differently: Inject 30 Units into the skin 2 (two) times daily.  04/11/16   Fritzi Mandes, MD  insulin lispro (HUMALOG) 100 UNIT/ML injection Inject 2-15 Units into the skin 3 (three) times daily before meals. Sliding scale (typically ~10 units with each meal)    [provider]  Insulin Pen Needle (PRODIGY MINI PEN NEEDLES) 31G X 5 MM MISC by Does not apply route as directed.    [provider]  metoprolol succinate (TOPROL-XL) 25 MG 24 hr tablet Take 25 mg by mouth every evening.     [provider]  rosuvastatin (CRESTOR) 20 MG tablet Take 20 mg by mouth at bedtime.     [provider]    Allergies Ezetimibe and Lipitor [atorvastatin]  Family History  Problem Relation Age of Onset  . Hypertension Mother   . Hyperlipidemia Mother   . Heart disease Mother   . Heart disease Father   . Hyperlipidemia Father   . Hypertension Father     Social History Social History   Tobacco Use  . Smoking status: Former Smoker    Packs/day: 0.50    Years: 0.00    Pack years: 0.00    Types: Cigarettes    Last attempt to quit: 06/11/1986    Years since quitting: 30.9  . Smokeless tobacco:  Never Used  Substance Use Topics  . Alcohol use: No    Comment: socially  . Drug use: No    Review of Systems Constitutional: No fever/chills Eyes: No visual changes. ENT: No sore throat. No stiff neck no neck pain Cardiovascular: Denies chest pain. Respiratory: Denies shortness of breath. Gastrointestinal:   See HPI  genitourinary: Negative for dysuria. Musculoskeletal: Negative lower extremity swelling Skin: Negative for rash. Neurological: Negative for severe headaches, focal weakness or numbness.   ____________________________________________   PHYSICAL EXAM:  VITAL SIGNS: ED Triage Vitals  Enc Vitals Group     BP 05/07/17 1036 (!) 159/90     Pulse Rate 05/07/17 1036 83     Resp 05/07/17 1036 16     Temp --      Temp Source 05/07/17 1036 Oral     SpO2 05/07/17  1036 100 %     Weight 05/07/17 1036 213 lb (96.6 kg)     Height 05/07/17 1036 6' (1.829 m)     Head Circumference --      Peak Flow --      Pain Score 05/07/17 1035 0     Pain Loc --      Pain Edu? --      Excl. in [place]? --     Constitutional: Alert and oriented.  Vision is nontoxic initially, Eyes: Conjunctivae are normal Head: Atraumatic HEENT: No congestion/rhinnorhea. Mucous membranes are moist.  Oropharynx non-erythematous Neck:   Nontender with no meningismus, no masses, no stridor Cardiovascular: Normal rate, regular rhythm. Grossly normal heart sounds.  Good peripheral circulation. Respiratory: Normal respiratory effort.  No retractions. Lungs CTAB. Abdominal: Soft and nontender. No distention. No guarding no rebound Back:  There is no focal tenderness or step off.  there is no midline tenderness there are no lesions noted. there is no CVA tenderness Rectal: Guaiac positive black stool Musculoskeletal: No lower extremity tenderness, no upper extremity tenderness. No joint effusions, no DVT signs strong distal pulses no edema Neurologic:  Normal speech and language. No gross focal neurologic  deficits are appreciated.  Skin:  Skin is warm, dry and intact. No rash noted. Psychiatric: Mood and affect are normal. Speech and behavior are normal.  ____________________________________________   LABS (all labs ordered are listed, but only abnormal results are displayed)  Labs Reviewed  LIPASE, BLOOD  COMPREHENSIVE METABOLIC PANEL  CBC  URINALYSIS, COMPLETE (UACMP) WITH MICROSCOPIC  PROTIME-INR  TYPE AND SCREEN    Pertinent labs  results that were available during my care of the patient were reviewed by me and considered in my medical decision making (see chart for details). ____________________________________________  EKG  I personally interpreted any EKGs ordered by me or triage Sinus rhythm, rate 86 bpm no acute ST elevation or depression, nonspecific ST changes the LAD noted ____________________________________________  RADIOLOGY  Pertinent labs & imaging results that were available during my care of the patient were reviewed by me and considered in my medical decision making (see chart for details). If possible, patient and/or family made aware of any abnormal findings.  No results found. ____________________________________________    PROCEDURES  Procedure(s) performed: None  Procedures  Critical Care performed: CRITICAL CARE Performed by: Schuyler Amor   Total critical care time: 45 minutes  Critical care time was exclusive of separately billable procedures and treating other patients.  Critical care was necessary to treat or prevent imminent or life-threatening deterioration.  Critical care was time spent personally by me on the following activities: development of treatment plan with patient and/or surrogate as well as nursing, discussions with consultants, evaluation of patient's response to treatment, examination of patient, obtaining history from patient or surrogate, ordering and performing treatments and interventions, ordering and review of  laboratory studies, ordering and review of radiographic studies, pulse oximetry and re-evaluation of patient's condition.   ____________________________________________   INITIAL IMPRESSION / ASSESSMENT AND PLAN / ED COURSE  Pertinent labs & imaging results that were available during my care of the patient were reviewed by me and considered in my medical decision making (see chart for details). Here with what is likely an upper GI bleed, vital signs reassuring thus far.  Blood work is pending.  He is guaiac positive.  He is recently started on Coumadin.  INR is unknown.  We are checking INR, CBC, CMP, type and screen, we will  give him IV anti-emetics as he has started vomiting.  We will start him on Protonix and Protonix drip, will start 2 IVs as possible, patient will need probable reversal of his Coumadin as soon as his INR comes back we will give him vitamin K and possibly centra.  He will require admission to the hospital.  ----------------------------------------- 11:25 AM on 05/07/2017 -----------------------------------------  Hemoglobin is not far from time the patient has had coffee-ground emesis here.  We will reverse his Coumadin if it is elevated.  I have called the lab they state within 10 minutes we should have a Coumadin result I will start with vitamin K on the presumption of elevated INR and give K Centra if needed.  ----------------------------------------- 11:34 AM on 05/07/2017 -----------------------------------------  Patient INR is 4.1, he was placed on Coumadin because of a questionable SVC thrombus that was seen at [facility], it was also thought possibly to be artifact.  This is not a compelling reason to keep the patient anticoagulated in the context of an active ongoing GI bleed.  Although initial hemoglobin is reassuring patient has as noted had bloody emesis here.  We will reverse his INR I am giving him Protonix, I have discussed with the hospitalist they agree with  management and we let GI know as well.    ____________________________________________   FINAL CLINICAL IMPRESSION(S) / ED DIAGNOSES  Final diagnoses:  None      This chart was dictated using voice recognition software.  Despite best efforts to proofread,  errors can occur which can change meaning.      Schuyler Amor, MD 05/07/17 1102    Schuyler Amor, MD 05/07/17 1126    Schuyler Amor, MD 05/07/17 1137

## 2017-05-07 NOTE — ED Notes (Signed)
Pt vomiting at this time, emesis noted to be black in color. MD aware, see orders

## 2017-05-08 ENCOUNTER — Inpatient Hospital Stay: Payer: Medicare HMO | Admitting: Anesthesiology

## 2017-05-08 ENCOUNTER — Encounter
Admission: EM | Disposition: A | Discharge status: Home Health | Payer: Self-pay | Source: Home / Self Care | Attending: Internal Medicine

## 2017-05-08 ENCOUNTER — Encounter: Payer: Self-pay | Admitting: Anesthesiology

## 2017-05-08 HISTORY — PX: ESOPHAGOGASTRODUODENOSCOPY (EGD) WITH PROPOFOL: SHX5813

## 2017-05-08 LAB — BASIC METABOLIC PANEL
Anion gap: 14 (ref 5–15)
BUN: 56 mg/dL — ABNORMAL HIGH (ref 6–20)
CO2: 23 mmol/L (ref 22–32)
Calcium: 7.6 mg/dL — ABNORMAL LOW (ref 8.9–10.3)
Chloride: 101 mmol/L (ref 101–111)
Creatinine, Ser: 5.79 mg/dL — ABNORMAL HIGH (ref 0.61–1.24)
GFR calc Af Amer: 11 mL/min — ABNORMAL LOW (ref >60)
GFR calc non Af Amer: 9 mL/min — ABNORMAL LOW (ref >60)
Glucose, Bld: 81 mg/dL (ref 65–99)
Potassium: 3.9 mmol/L (ref 3.5–5.1)
Sodium: 138 mmol/L (ref 135–145)

## 2017-05-08 LAB — GLUCOSE, CAPILLARY
GLUCOSE-CAPILLARY: 82 mg/dL (ref 65–99)
GLUCOSE-CAPILLARY: 69 mg/dL (ref 65–99)
Glucose-Capillary: 74 mg/dL (ref 65–99)
Glucose-Capillary: 118 mg/dL — ABNORMAL HIGH (ref 65–99)

## 2017-05-08 LAB — CBC
HCT: 22.2 % — ABNORMAL LOW (ref 40.0–52.0)
Hemoglobin: 7.3 g/dL — ABNORMAL LOW (ref 13.0–18.0)
MCH: 26.9 pg (ref 26.0–34.0)
MCHC: 33 g/dL (ref 32.0–36.0)
MCV: 81.4 fL (ref 80.0–100.0)
PLATELETS: 141 10*3/uL — AB (ref 150–440)
RBC: 2.72 MIL/uL — ABNORMAL LOW (ref 4.40–5.90)
RDW: 19 % — AB (ref 11.5–14.5)
WBC: 7.3 10*3/uL (ref 3.8–10.6)

## 2017-05-08 LAB — PROTIME-INR
INR: 1.26
PROTHROMBIN TIME: 15.7 s — AB (ref 11.4–15.2)

## 2017-05-08 LAB — VITAMIN B12: Vitamin B-12: 357 pg/mL (ref 180–914)

## 2017-05-08 LAB — FOLATE: Folate: 3.8 ng/mL — ABNORMAL LOW (ref >5.9)

## 2017-05-08 SURGERY — ESOPHAGOGASTRODUODENOSCOPY (EGD) WITH PROPOFOL
Anesthesia: General

## 2017-05-08 MED ORDER — PROPOFOL 500 MG/50ML IV EMUL
INTRAVENOUS | Status: DC | PRN
  Administered 2017-05-08: 160 ug/kg/min via INTRAVENOUS

## 2017-05-08 MED ORDER — RENA-VITE PO TABS
1.0000 | ORAL_TABLET | Freq: Every day | ORAL | Status: DC
  Administered 2017-05-08 – 2017-05-09 (×2): 1 via ORAL
  Filled 2017-05-08 (×3): qty 1

## 2017-05-08 MED ORDER — SODIUM CHLORIDE 0.9 % IV SOLN
INTRAVENOUS | Status: DC
  Administered 2017-05-09: 13:00:00 via INTRAVENOUS

## 2017-05-08 MED ORDER — PROPOFOL 10 MG/ML IV BOLUS
INTRAVENOUS | Status: AC
  Filled 2017-05-08: qty 20

## 2017-05-08 MED ORDER — PEG 3350-KCL-NA BICARB-NACL 420 G PO SOLR
4000.0000 mL | Freq: Once | ORAL | Status: AC
  Administered 2017-05-08: 4000 mL via ORAL
  Filled 2017-05-08: qty 4000

## 2017-05-08 MED ORDER — EPOETIN ALFA 10000 UNIT/ML IJ SOLN
10000.0000 [IU] | INTRAMUSCULAR | Status: DC
  Administered 2017-05-08 – 2017-05-10 (×2): 10000 [IU] via INTRAVENOUS

## 2017-05-08 MED ORDER — SODIUM CHLORIDE 0.9 % IV SOLN
INTRAVENOUS | Status: DC
  Administered 2017-05-08: 1000 mL via INTRAVENOUS

## 2017-05-08 MED ORDER — PROPOFOL 10 MG/ML IV BOLUS
INTRAVENOUS | Status: DC | PRN
  Administered 2017-05-08: 30 mg via INTRAVENOUS

## 2017-05-08 MED ORDER — LIDOCAINE HCL (CARDIAC) 20 MG/ML IV SOLN
INTRAVENOUS | Status: DC | PRN
  Administered 2017-05-08: 50 mg via INTRAVENOUS

## 2017-05-08 MED ORDER — LIDOCAINE HCL (PF) 2 % IJ SOLN
INTRAMUSCULAR | Status: AC
  Filled 2017-05-08: qty 10

## 2017-05-08 MED ORDER — PANTOPRAZOLE SODIUM 40 MG IV SOLR
40.0000 mg | Freq: Two times a day (BID) | INTRAVENOUS | Status: DC
  Administered 2017-05-08 – 2017-05-10 (×4): 40 mg via INTRAVENOUS
  Filled 2017-05-08 (×6): qty 40

## 2017-05-08 NOTE — Transfer of Care (Signed)
Immediate Anesthesia Transfer of Care Note  Patient: Terry Tyler  Procedure(s) Performed: ESOPHAGOGASTRODUODENOSCOPY (EGD) WITH PROPOFOL (N/A )  Patient Location: PACU  Anesthesia Type:General  Level of Consciousness: sedated  Airway & Oxygen Therapy: Patient Spontanous Breathing and Patient connected to nasal cannula oxygen  Post-op Assessment: Report given to RN and Post -op Vital signs reviewed and stable  Post vital signs: Reviewed and stable  Last Vitals:  Vitals:   05/08/17 1322 05/08/17 1446  BP: 123/65 136/67  Pulse: 80 83  Resp: 17 20  Temp:  36.4 C  SpO2: 100% 100%    Last Pain:  Vitals:   05/08/17 1446  TempSrc: Tympanic  PainSc:          Complications: No apparent anesthesia complications

## 2017-05-08 NOTE — Progress Notes (Signed)
Pre HD  

## 2017-05-08 NOTE — Anesthesia Post-op Follow-up Note (Signed)
Anesthesia QCDR form completed.        

## 2017-05-08 NOTE — Progress Notes (Signed)
[facility]  Patient Status: Active  [facility] is providing the following services: PT/OT  If patient discharges after hours, please call [phone].   Terry Tyler 05/08/2017, 9:37 AM

## 2017-05-08 NOTE — Progress Notes (Signed)
Initial Nutrition Assessment  DOCUMENTATION CODES:   Severe malnutrition in context of chronic illness  INTERVENTION:  1. Ensure Enlive po BID, each supplement provides 350 kcal and 20 grams of protein 2. Discussed with Dr. Abigail Butts, Niacin and Vitamin C labs ordered.  NUTRITION DIAGNOSIS:   Severe Malnutrition related to chronic illness as evidenced by percent weight loss, energy intake < 75% for > or equal to 1 month.  GOAL:   Patient will meet greater than or equal to 90% of their needs  MONITOR:   PO intake, Supplement acceptance, I & O's, Labs, Weight trends  REASON FOR ASSESSMENT:   Malnutrition Screening Tool    ASSESSMENT:   Terry Tyler has a PMH of ESRD on HD MWF, CAD, HTN, HLD, secondary hyperparathyroidism presents with multiple episodes of coffee ground emesis, has had chronic nausea and vomiting since starting dialysis.  Spoke with patient at bedside during dialysis. Discussed with Dr. Abigail Butts Patient reports chronic nausea/vomiting/diarrhea and a 50 pound/20% severe weight loss over the past 4-5 months. He also reports taste alterations since starting dialysis. Complains that food doesn't taste like it should but continues to eat it "until it goes back to normal." Normally consumes no breakfast or dinner, will eat a small meal or snack for lunch. Vomits and has diarrhea daily. Reports Current dry weight of 93kg. Currently NPO. NFPE WNL limits. No micronutrient deficiencies noted despite patient's decreased PO intake.  Labs reviewed:  TBili 4.8 Medications reviewed and include:  Insulin   NUTRITION - FOCUSED PHYSICAL EXAM:    Most Recent Value  Orbital Region  No depletion  Upper Arm Region  No depletion  Thoracic and Lumbar Region  No depletion  Buccal Region  Mild depletion  Temple Region  Mild depletion  Clavicle Bone Region  No depletion  Clavicle and Acromion Bone Region  No depletion  Scapular Bone Region  No depletion  Dorsal Hand  No depletion   Patellar Region  No depletion  Anterior Thigh Region  No depletion  Posterior Calf Region  No depletion  Edema (RD Assessment)  None  Hair  Reviewed  Eyes  Reviewed [sticky secretions noted]  Mouth  Reviewed  Skin  Reviewed  Nails  Reviewed       Diet Order:  Diet NPO time specified  EDUCATION NEEDS:   Education needs have been addressed  Skin:  Skin Assessment: Reviewed RN Assessment  Last BM:  05/07/2017 (type 5)  Height:   Ht Readings from Last 1 Encounters:  05/07/17 6' (1.829 m)    Weight:   Wt Readings from Last 1 Encounters:  05/08/17 198 lb 6.6 oz (90 kg)    Ideal Body Weight:  80.9 kg  BMI:  Body mass index is 26.91 kg/m.  Estimated Nutritional Needs:   Kcal:  2200-2400 calories  Protein:  110-136 grams  Fluid:  UOP +1L  Satira Anis. Lashan Macias, MS, RD LDN Inpatient Clinical Dietitian Pager [phone]

## 2017-05-08 NOTE — Progress Notes (Signed)
[facility]  ROUNDING NOTE   Subjective:   Seen and examined on hemodialysis. Tolerating treatment well. UF of 1.5 liters.   States he has no appetite and he states he is loosing weight significantly.   Hemoglobin 7.3. Endoscopy for today.     HEMODIALYSIS FLOWSHEET:  Blood Flow Rate (mL/min): 400 mL/min Arterial Pressure (mmHg): -190 mmHg Venous Pressure (mmHg): 180 mmHg Transmembrane Pressure (mmHg): 70 mmHg Ultrafiltration Rate (mL/min): 300 mL/min Dialysate Flow Rate (mL/min): 600 ml/min Conductivity: Machine : 13.9 Conductivity: Machine : 13.9 Dialysis Fluid Bolus: Normal Saline Bolus Amount (mL): 250 mL Dialysate Change: (3k)   Objective:  Vital signs in last 24 hours:  Temp:  [97.6 F (36.4 C)-99 F (37.2 C)] 98.7 F (37.1 C) (11/28 0931) Pulse Rate:  [75-87] 79 (11/28 1045) Resp:  [15-33] 20 (11/28 1045) BP: (91-148)/(55-79) 133/72 (11/28 1045) SpO2:  [96 %-100 %] 96 % (11/28 1045) Weight:  [91.1 kg (200 lb 12.8 oz)] 91.1 kg (200 lb 12.8 oz) (11/28 0931)  Weight change:  Filed Weights   05/07/17 1036 05/08/17 0931  Weight: 96.6 kg (213 lb) 91.1 kg (200 lb 12.8 oz)    Intake/Output: I/O last 3 completed shifts: In: 0737 [P.O.:1256] Out: 0    Intake/Output this shift:  No intake/output data recorded.  Physical Exam: General: NAD,   Head: Normocephalic, atraumatic. Moist oral mucosal membranes  Eyes: Anicteric, PERRL  Neck: Supple, trachea midline  Lungs:  Clear to auscultation  Heart: Regular rate and rhythm  Abdomen:  Soft, nontender,   Extremities: no peripheral edema.  Neurologic: Nonfocal, moving all four extremities  Skin: No lesions  Access: Left AVF    Basic Metabolic Panel: Recent Labs  Lab 05/07/17 1042 05/08/17 0146  NA 139 138  K 4.6 3.9  CL 98* 101  CO2 25 23  GLUCOSE 136* 81  BUN 44* 56*  CREATININE 5.05* 5.79*  CALCIUM 7.7* 7.6*    Liver Function Tests: Recent Labs  Lab 05/07/17 1042  AST 46*  ALT  24  ALKPHOS 485*  BILITOT 4.8*  PROT 7.5  ALBUMIN 2.8*   Recent Labs  Lab 05/07/17 1042  LIPASE 20   No results for input(s): AMMONIA in the last 168 hours.  CBC: Recent Labs  Lab 05/07/17 1042 05/07/17 1408 05/07/17 1910 05/08/17 0146  WBC 9.2  --   --  7.3  HGB 9.1* 8.9* 8.0* 7.3*  HCT 27.7*  --   --  22.2*  MCV 82.2  --   --  81.4  PLT 160  --   --  141*    Cardiac Enzymes: No results for input(s): CKTOTAL, CKMB, CKMBINDEX, TROPONINI in the last 168 hours.  BNP: Invalid input(s): POCBNP  CBG: Recent Labs  Lab 05/07/17 1705 05/07/17 2132 05/08/17 0719  GLUCAP 155* 88 56    Microbiology: Results for orders placed or performed during the hospital encounter of 05/07/17  MRSA PCR Screening     Status: None   Collection Time: 05/07/17  5:59 PM  Result Value Ref Range Status   MRSA by PCR NEGATIVE NEGATIVE Final    Comment:        The GeneXpert MRSA Assay (FDA approved for NASAL specimens only), is one component of a comprehensive MRSA colonization surveillance program. It is not intended to diagnose MRSA infection nor to guide or monitor treatment for MRSA infections.     Coagulation Studies: Recent Labs    05/07/17 1042  LABPROT 40.1*  INR 4.19*  Urinalysis: No results for input(s): COLORURINE, LABSPEC, PHURINE, GLUCOSEU, HGBUR, BILIRUBINUR, KETONESUR, PROTEINUR, UROBILINOGEN, NITRITE, LEUKOCYTESUR in the last 72 hours.  Invalid input(s): APPERANCEUR    Imaging: Dg Chest 2 View  Result Date: 05/07/2017 CLINICAL DATA:  Vomiting. EXAM: CHEST  2 VIEW COMPARISON:  Radiograph of February 20, 2017. FINDINGS: Stable cardiomegaly with central pulmonary vascular congestion is noted. Status post coronary artery bypass graft. No pneumothorax or pleural effusion is noted. No consolidative process is noted. Bony thorax is unremarkable. IMPRESSION: Stable cardiomegaly with central pulmonary vascular congestion. Electronically Signed   By: Marijo Conception, M.D.   On: 05/07/2017 13:50     Medications:    . calcitRIOL  0.25 mcg Oral Daily  . cinacalcet  60 mg Oral Daily  . gabapentin  300 mg Oral TID  . hydrALAZINE  50 mg Oral BID  . insulin aspart  0-5 Units Subcutaneous QHS  . insulin aspart  0-9 Units Subcutaneous TID WC  . metoprolol tartrate  25 mg Oral Daily  . rosuvastatin  20 mg Oral QHS   acetaminophen **OR** acetaminophen, benzonatate, ondansetron **OR** ondansetron (ZOFRAN) IV  Assessment/ Plan:  [name] is a 66 y.o. black male  With end stage renal disease on hemodialysis, hypertension, coronary artery disease status post CABG, TIA/CVA, diabetes mellitus insulin dependent, peripheral vascular disease  MWF [facility] Left AVF  1. End Stage Renal Disease: MWF: seen and examined on hemodialysis. Tolerating treatment.   2. Hypertension: blood pressure at goal.  - hydralazine and metoprolol    3. Anemia with chronic kidney disease and GI bleed. Hemoglobin dropping - mircera as outpatient. EPO today on treatment.  - Appreciate GI input.   4. Secondary Hyperparathyroidism:  - cinacalcet, calcitriol. Not currently on binders.   5. Malnutrition: albumin 2.8. Poor appetite - check water soluble vitamin levels - Start dialysis vitamin.    LOS: Terry Tyler, Terry Tyler 11/28/201811:18 AM

## 2017-05-08 NOTE — Anesthesia Procedure Notes (Signed)
Date/Time: 05/08/2017 4:06 PM Performed by: Johnna Acosta, CRNA Pre-anesthesia Checklist: Patient identified, Emergency Drugs available, Suction available, Patient being monitored and Timeout performed Patient Re-evaluated:Patient Re-evaluated prior to induction Oxygen Delivery Method: Nasal cannula

## 2017-05-08 NOTE — Progress Notes (Signed)
[facility] [place] NAME: Terry Tyler    MR#:  [id number]  DATE OF BIRTH:  06-01-1951  SUBJECTIVE:   Patient came in with coffee-ground emesis and blackish colored stools.  No more emesis.  He is seen in hemodialysis doing well. REVIEW OF SYSTEMS:   Review of Systems  Constitutional: Negative for chills, fever and weight loss.  HENT: Negative for ear discharge, ear pain and nosebleeds.   Eyes: Negative for blurred vision, pain and discharge.  Respiratory: Negative for sputum production, shortness of breath, wheezing and stridor.   Cardiovascular: Negative for chest pain, palpitations, orthopnea and PND.  Gastrointestinal: Negative for abdominal pain, diarrhea, nausea and vomiting.  Genitourinary: Negative for frequency and urgency.  Musculoskeletal: Negative for back pain and joint pain.  Neurological: Negative for sensory change, speech change, focal weakness and weakness.  Psychiatric/Behavioral: Negative for depression and hallucinations. The patient is not nervous/anxious.    Tolerating Diet:npo Tolerating PT: pending  DRUG ALLERGIES:   Allergies  Allergen Reactions  . Ezetimibe Other (See Comments)    Cannot remember what the allergy was.  . Lipitor [Atorvastatin] Other (See Comments)    Muscle cramping    VITALS:  Blood pressure 129/63, pulse 78, temperature 98.7 F (37.1 C), temperature source Oral, resp. rate 19, height 6' (1.829 m), weight 91.1 kg (200 lb 12.8 oz), SpO2 100 %.  PHYSICAL EXAMINATION:   Physical Exam  GENERAL:  66 y.o.-year-old patient lying in the bed with no acute distress.  EYES: Pupils equal, round, reactive to light and accommodation. No scleral icterus. Extraocular muscles intact.  HEENT: Head atraumatic, normocephalic. Oropharynx and nasopharynx clear.  NECK:  Supple, no jugular venous distention. No thyroid enlargement, no tenderness.  LUNGS: Normal breath sounds bilaterally, no  wheezing, rales, rhonchi. No use of accessory muscles of respiration.  CARDIOVASCULAR: S1, S2 normal. No murmurs, rubs, or gallops.  ABDOMEN: Soft, nontender, nondistended. Bowel sounds present. No organomegaly or mass.  EXTREMITIES: No cyanosis, clubbing or edema b/l.    NEUROLOGIC: Cranial nerves II through XII are intact. No focal Motor or sensory deficits b/l.   PSYCHIATRIC:  patient is alert and oriented x 3.  SKIN: No obvious rash, lesion, or ulcer.   LABORATORY PANEL:  CBC Recent Labs  Lab 05/08/17 0146  WBC 7.3  HGB 7.3*  HCT 22.2*  PLT 141*    Chemistries  Recent Labs  Lab 05/07/17 1042 05/08/17 0146  NA 139 138  K 4.6 3.9  CL 98* 101  CO2 25 23  GLUCOSE 136* 81  BUN 44* 56*  CREATININE 5.05* 5.79*  CALCIUM 7.7* 7.6*  AST 46*  --   ALT 24  --   ALKPHOS 485*  --   BILITOT 4.8*  --    Cardiac Enzymes No results for input(s): TROPONINI in the last 168 hours. RADIOLOGY:  Dg Chest 2 View  Result Date: 05/07/2017 CLINICAL DATA:  Vomiting. EXAM: CHEST  2 VIEW COMPARISON:  Radiograph of February 20, 2017. FINDINGS: Stable cardiomegaly with central pulmonary vascular congestion is noted. Status post coronary artery bypass graft. No pneumothorax or pleural effusion is noted. No consolidative process is noted. Bony thorax is unremarkable. IMPRESSION: Stable cardiomegaly with central pulmonary vascular congestion. Electronically Signed   By: Marijo Conception, M.D.   On: 05/07/2017 13:50   ASSESSMENT AND PLAN:  66 year old male with past history of end-stage renal disease on hemodialysis, secondary hyperparathyroidism, hypertension, hyperlipidemia, history of previous MI, history  of coronary artery disease presents to the hospital due to multiple episodes of coffee-ground emesis.  1. GI bleed-this is suspected to be an upper GI bleed given the patient's coffee-ground emesis. 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 -Hold aspirin, Coumadin  for now. Patient received some vitamin K in the ER. -Hemoglobin  9.0--8.9--8.0--7.3 - Keep him nothing by mouth for now.  - gastroenterology consultwith Dr Alice Reichert. ---EGD today -on IV Protonix. -transfuse as needed  2. End-stage renal disease on hemodialysis-patient gets dialysis on Monday Wednesday and Friday. -consulted  nephrology.  3. Essential hypertension-continue metoprolol, hydralazine.  4. Hyperlipidemia-continue Crestor.  5. Neuropathy-continue gabapentin.  6. Diabetes type 2 without complication-we'll place him on sliding scale insulin. Hold Lantus for now.  7. Secondary hyperparathyroidism-continue Sensipar, Rocaltrol.    Case discussed with Care Management/Social Worker. Management plans discussed with the patient, family and they are in agreement.  CODE STATUS: FULL  DVT Prophylaxis: SCD  TOTAL TIME TAKING CARE OF THIS PATIENT: *40* minutes.  >50% time spent on counselling and coordination of care  POSSIBLE D/C IN *1-2* DAYS, DEPENDING ON CLINICAL CONDITION.  Note: This dictation was prepared with Dragon dictation along with smaller phrase technology. Any transcriptional errors that result from this process are unintentional.  Fritzi Mandes M.D on 05/08/2017 at 12:48 PM  Between 7am to 6pm - Pager - [phone]  After 6pm go to [email or web address] - password [email or web address] [facility]  Office  [phone]  CC: Primary care physician; Marguerita Merles, MD

## 2017-05-08 NOTE — Progress Notes (Signed)
HD initiated without issue via L AVF with 15g needles x2. Patient without complaints. NPO for procedure this afternoon. 3k bath. Continue to monitor.

## 2017-05-08 NOTE — Progress Notes (Signed)
HD completed without issue. All blood returned. Patient tolerated well. Goal met as ordered.

## 2017-05-08 NOTE — Care Management (Signed)
Amanda Morris HD liaison notified of admission.  

## 2017-05-08 NOTE — Anesthesia Preprocedure Evaluation (Signed)
Anesthesia Evaluation  Patient identified by MRN, date of birth, ID band Patient awake    Reviewed: Allergy & Precautions, NPO status , Patient's Chart, lab work & pertinent test results  History of Anesthesia Complications Negative for: history of anesthetic complications  Airway Mallampati: II  TM Distance: >3 FB Neck ROM: Full    Dental  (+) Missing, Poor Dentition   Pulmonary neg sleep apnea, neg COPD, former smoker,    breath sounds clear to auscultation- rhonchi (-) wheezing      Cardiovascular hypertension, + CAD, + Past MI, + Cardiac Stents (last stent 1 mo ago, cleared by cardiology to proceed with AV fistula surgery) and + Peripheral Vascular Disease   Rhythm:Regular Rate:Normal - Systolic murmurs and - Diastolic murmurs    Neuro/Psych CVA, No Residual Symptoms negative psych ROS   GI/Hepatic negative GI ROS, Neg liver ROS,   Endo/Other  diabetes, Insulin Dependent  Renal/GU ESRF and DialysisRenal disease (last dialysis 10/24/16)     Musculoskeletal negative musculoskeletal ROS (+)   Abdominal (+) + obese,   Peds  Hematology negative hematology ROS (+) anemia ,   Anesthesia Other Findings Past Medical History: No date: CKD (chronic kidney disease) No date: Coronary atherosclerosis of unspecified type o*     Comment: Myocardial infarction in 2006. Cardiac               catheterization showed significant three-vessel              coronary artery disease. He underwent CABG at               [facility]. Most recent cardiac catheterization              in 2010 showed normal LV systolic function,               patent grafts to OM1, RCA and LAD. Occluded SVG              to diagonal. No date: Diabetes mellitus without complication (HCC)     Comment: type I No date: Dialysis patient [facility])     Comment: Mon. Wed. Fri. 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 2006: Myocardial infarction ([place]) No date: Neoplasm of uncertain behavior, site unspecifi* No date: Peripheral vascular disease, unspecified ([place]) No date: Proteinuria 04/2016: Stroke ([place])     Comment: TIA No date: Unspecified vitamin D deficiency   Reproductive/Obstetrics                             Anesthesia Physical  Anesthesia Plan  ASA: IV  Anesthesia Plan: General   Post-op Pain Management:  Regional for Post-op pain   Induction: Intravenous  PONV Risk Score and Plan:   Airway Management Planned: Nasal Cannula  Additional Equipment:   Intra-op Plan:   Post-operative Plan:   Informed Consent: I have reviewed the patients History and Physical, chart, labs and discussed the procedure including the risks, benefits and alternatives for the proposed anesthesia with the patient or authorized representative who has indicated his/her understanding and acceptance.   Dental advisory given  Plan Discussed with: CRNA and Anesthesiologist  Anesthesia Plan Comments:         Anesthesia Quick Evaluation

## 2017-05-08 NOTE — Progress Notes (Signed)
Post HD assessment unchanged  

## 2017-05-08 NOTE — Op Note (Signed)
[facility] Gastroenterology Patient Name: Terry Tyler Procedure Date: 05/08/2017 4:05 PM MRN: [id number] Account #: [id number] Date of Birth: 1951/05/23 Admit Type: Inpatient Age: 66 Room: [facility] ENDO ROOM 4 Gender: Male Note Status: Finalized Procedure:            Upper GI endoscopy Indications:          Suspected upper gastrointestinal bleeding, Iron                        deficiency anemia due to suspected upper                        gastrointestinal bleeding Providers:            Benay Pike. Alice Reichert MD, MD Referring MD:         Marguerita Merles, MD (Referring MD) Medicines:            Propofol per Anesthesia Complications:        No immediate complications. Procedure:            Pre-Anesthesia Assessment:                       - The risks and benefits of the procedure and the                        sedation options and risks were discussed with the                        patient. All questions were answered and informed                        consent was obtained.                       - Patient identification and proposed procedure were                        verified prior to the procedure by the nurse. The                        procedure was verified in the procedure room.                       - ASA Grade Assessment: II - A patient with mild                        systemic disease.                       - After reviewing the risks and benefits, the patient                        was deemed in satisfactory condition to undergo the                        procedure.                       After obtaining informed consent, the endoscope was  passed under direct vision. Throughout the procedure,                        the patient's blood pressure, pulse, and oxygen                        saturations were monitored continuously. The                        Colonoscope was introduced through the mouth, and                        advanced to  the third part of duodenum. The upper GI                        endoscopy was accomplished without difficulty. The                        patient tolerated the procedure well. Findings:      The examined esophagus was normal.      Diffuse moderately erythematous mucosa without bleeding was found in the       gastric antrum. Biopsies were taken with a cold forceps for histology to       rule out portal hypertensive gastropathy.      Mild gastric antral vascular ectasia without bleeding was present in the       gastric antrum. Biopsies were taken with a cold forceps for histology.      A single 3 mm angioectasia without bleeding was found in the duodenal       bulb. Argon Plasma Coagulator was used at 1 LPM @ 40 w to single bulbar 0      angioectasia. Postprocedure appearance was satisfactory. Impression:           - Normal esophagus.                       - Erythematous mucosa in the antrum. Biopsied.                       - Gastric antral vascular ectasia without bleeding.                        Biopsied.                       - A single non-bleeding angioectasia in the duodenum. Recommendation:       - Patient has a contact number available for                        emergencies. The signs and symptoms of potential                        delayed complications were discussed with the patient.                        Return to normal activities tomorrow. Written discharge                        instructions were provided to the patient.                       -  Clear liquid diet today.                       - Perform a colonoscopy tomorrow.                       - The findings and recommendations were discussed with                        the patient and their family.                       - Return patient to hospital ward for ongoing care. Procedure Code(s):    --- Professional ---                       [phone], Esophagogastroduodenoscopy, flexible, transoral;                        with  biopsy, single or multiple Diagnosis Code(s):    --- Professional ---                       K31.89, Other diseases of stomach and duodenum                       K31.819, Angiodysplasia of stomach and duodenum without                        bleeding                       D50.9, Iron deficiency anemia, unspecified CPT copyright 2016 American Medical Association. All rights reserved. The codes documented in this report are preliminary and upon coder review may  be revised to meet current compliance requirements. Efrain Sella MD, MD 05/08/2017 4:39:56 PM This report has been signed electronically. Number of Addenda: 0 Note Initiated On: 05/08/2017 4:05 PM      [facility]

## 2017-05-09 ENCOUNTER — Inpatient Hospital Stay: Payer: Medicare HMO | Admitting: Anesthesiology

## 2017-05-09 ENCOUNTER — Encounter
Admission: EM | Disposition: A | Discharge status: Home Health | Payer: Self-pay | Source: Home / Self Care | Attending: Internal Medicine

## 2017-05-09 ENCOUNTER — Encounter: Payer: Self-pay | Admitting: *Deleted

## 2017-05-09 HISTORY — PX: COLONOSCOPY WITH PROPOFOL: SHX5780

## 2017-05-09 LAB — GLUCOSE, CAPILLARY
GLUCOSE-CAPILLARY: 74 mg/dL (ref 65–99)
Glucose-Capillary: 91 mg/dL (ref 65–99)
Glucose-Capillary: 84 mg/dL (ref 65–99)
Glucose-Capillary: 86 mg/dL (ref 65–99)
Glucose-Capillary: 110 mg/dL — ABNORMAL HIGH (ref 65–99)
Glucose-Capillary: 143 mg/dL — ABNORMAL HIGH (ref 65–99)

## 2017-05-09 SURGERY — COLONOSCOPY WITH PROPOFOL
Anesthesia: General

## 2017-05-09 MED ORDER — FENTANYL CITRATE (PF) 100 MCG/2ML IJ SOLN
INTRAMUSCULAR | Status: DC | PRN
  Administered 2017-05-09: 50 ug via INTRAVENOUS

## 2017-05-09 MED ORDER — PROPOFOL 500 MG/50ML IV EMUL
INTRAVENOUS | Status: AC
  Filled 2017-05-09: qty 50

## 2017-05-09 MED ORDER — PROPOFOL 500 MG/50ML IV EMUL
INTRAVENOUS | Status: DC | PRN
  Administered 2017-05-09: 100 ug/kg/min via INTRAVENOUS

## 2017-05-09 MED ORDER — NEPRO/CARBSTEADY PO LIQD: 237.0000 mL | Freq: Two times a day (BID) | ORAL | Status: DC

## 2017-05-09 MED ORDER — MIDAZOLAM HCL 2 MG/2ML IJ SOLN
INTRAMUSCULAR | Status: DC | PRN
  Administered 2017-05-09: 1 mg via INTRAVENOUS

## 2017-05-09 MED ORDER — EPHEDRINE SULFATE 50 MG/ML IJ SOLN
INTRAMUSCULAR | Status: DC | PRN
  Administered 2017-05-09 (×2): 10 mg via INTRAVENOUS

## 2017-05-09 MED ORDER — MIDAZOLAM HCL 2 MG/2ML IJ SOLN
INTRAMUSCULAR | Status: AC
  Filled 2017-05-09: qty 2

## 2017-05-09 MED ORDER — FENTANYL CITRATE (PF) 100 MCG/2ML IJ SOLN
INTRAMUSCULAR | Status: AC
  Filled 2017-05-09: qty 2

## 2017-05-09 MED ORDER — ADULT MULTIVITAMIN W/MINERALS CH
1.0000 | ORAL_TABLET | Freq: Every day | ORAL | Status: DC
  Administered 2017-05-09: 1 via ORAL
  Filled 2017-05-09: qty 1

## 2017-05-09 MED ORDER — SODIUM CHLORIDE 0.9 % IV SOLN: INTRAVENOUS | Status: DC

## 2017-05-09 NOTE — Anesthesia Preprocedure Evaluation (Signed)
Anesthesia Evaluation  Patient identified by MRN, date of birth, ID band Patient awake    Reviewed: Allergy & Precautions, NPO status , Patient's Chart, lab work & pertinent test results  History of Anesthesia Complications Negative for: history of anesthetic complications  Airway Mallampati: II  TM Distance: >3 FB Neck ROM: Full    Dental  (+) Missing, Poor Dentition   Pulmonary neg sleep apnea, neg COPD, former smoker,    breath sounds clear to auscultation- rhonchi (-) wheezing      Cardiovascular hypertension, + CAD, + Past MI, + Cardiac Stents (last stent 1 mo ago, cleared by cardiology to proceed with AV fistula surgery) and + Peripheral Vascular Disease   Rhythm:Regular Rate:Normal - Systolic murmurs and - Diastolic murmurs    Neuro/Psych CVA, No Residual Symptoms negative psych ROS   GI/Hepatic negative GI ROS, Neg liver ROS,   Endo/Other  diabetes, Insulin Dependent  Renal/GU ESRF and DialysisRenal disease (last dialysis 10/24/16)     Musculoskeletal negative musculoskeletal ROS (+)   Abdominal (+) + obese,   Peds  Hematology negative hematology ROS (+) anemia ,   Anesthesia Other Findings   Reproductive/Obstetrics                             Anesthesia Physical  Anesthesia Plan  ASA: IV  Anesthesia Plan: General   Post-op Pain Management:  Regional for Post-op pain   Induction: Intravenous  PONV Risk Score and Plan:   Airway Management Planned: Nasal Cannula  Additional Equipment:   Intra-op Plan:   Post-operative Plan:   Informed Consent: I have reviewed the patients History and Physical, chart, labs and discussed the procedure including the risks, benefits and alternatives for the proposed anesthesia with the patient or authorized representative who has indicated his/her understanding and acceptance.   Dental advisory given  Plan Discussed with: CRNA and  Anesthesiologist  Anesthesia Plan Comments:         Anesthesia Quick Evaluation

## 2017-05-09 NOTE — Transfer of Care (Signed)
Immediate Anesthesia Transfer of Care Note  Patient: Terry Tyler  Procedure(s) Performed: COLONOSCOPY WITH PROPOFOL (N/A )  Patient Location: PACU  Anesthesia Type:General  Level of Consciousness: awake and sedated  Airway & Oxygen Therapy: Patient Spontanous Breathing and Patient connected to nasal cannula oxygen  Post-op Assessment: Report given to RN and Post -op Vital signs reviewed and stable  Post vital signs: Reviewed  Last Vitals:  Vitals:   05/09/17 1206 05/09/17 1232  BP: (!) 116/53 (!) 113/50  Pulse: 75 73  Resp:  20  Temp: 37.1 C 36.9 C  SpO2: 99% 100%    Last Pain:  Vitals:   05/09/17 1232  TempSrc: Tympanic  PainSc:          Complications: No apparent anesthesia complications

## 2017-05-09 NOTE — Op Note (Signed)
[facility] Gastroenterology Patient Name: Terry Tyler Procedure Date: 05/09/2017 1:15 PM MRN: [id number] Account #: [id number] Date of Birth: [birth year] Admit Type: Inpatient Age: 66 Room: [facility] ENDO ROOM 3 Gender: Male Note Status: Finalized Procedure:            Colonoscopy Indications:          Evaluation of unexplained GI bleeding Providers:            Benay Pike. Alice Reichert MD, MD Referring MD:         Marguerita Merles, MD (Referring MD) Medicines:            Propofol per Anesthesia Complications:        No immediate complications. Procedure:            Pre-Anesthesia Assessment:                       - The risks and benefits of the procedure and the                        sedation options and risks were discussed with the                        patient. All questions were answered and informed                        consent was obtained.                       - Patient identification and proposed procedure were                        verified prior to the procedure by the nurse. The                        procedure was verified in the procedure room.                       - ASA Grade Assessment: III - A patient with severe                        systemic disease.                       - After reviewing the risks and benefits, the patient                        was deemed in satisfactory condition to undergo the                        procedure.                       After obtaining informed consent, the colonoscope was                        passed under direct vision. Throughout the procedure,                        the patient's blood pressure, pulse, and oxygen  saturations were monitored continuously. The                        Colonoscope was introduced through the anus and                        advanced to the the terminal ileum, with identification                        of the appendiceal orifice and IC valve. The terminal                  ileum, ileocecal valve, appendiceal orifice, and rectum                        were photographed. The colonoscopy was performed                        without difficulty. The patient tolerated the procedure                        well. The quality of the bowel preparation was good. Findings:      The perianal and digital rectal examinations were normal.      A 2 mm polyp was found in the cecum. The polyp was sessile. The polyp       was removed with a cold biopsy forceps. Resection and retrieval were       complete.      A 5 mm polyp was found in the cecum. The polyp was sessile. The polyp       was removed with a hot snare. Resection and retrieval were complete.      A 3 mm polyp was found in the transverse colon. The polyp was sessile.       The polyp was removed with a cold biopsy forceps. Resection and       retrieval were complete.      A 5 mm polyp was found in the transverse colon. The polyp was sessile.       The polyp was removed with a hot snare. Resection and retrieval were       complete.      Non-bleeding internal hemorrhoids were found during retroflexion. The       hemorrhoids were Grade III (internal hemorrhoids that prolapse but       require manual reduction).      The exam was otherwise without abnormality.      The terminal ileum appeared normal. Impression:           - One 2 mm polyp in the cecum, removed with a cold                        biopsy forceps. Resected and retrieved.                       - One 5 mm polyp in the cecum, removed with a hot                        snare. Resected and retrieved.                       - One 3 mm polyp in the transverse colon, removed with  a cold biopsy forceps. Resected and retrieved.                       - One 5 mm polyp in the transverse colon, removed with                        a hot snare. Resected and retrieved.                       - Non-bleeding internal hemorrhoids.                        - The examination was otherwise normal. Recommendation:       - Resume previous diet.                       - Continue present medications.                       - Post-Procedure Resumption of Anticoagulants: Restart                        warfarin tomorrow per attending physician PO adjustment                        per managing physician.                       - Await pathology results.                       - Return patient to hospital ward for observation. Procedure Code(s):    --- Professional ---                       [phone], Colonoscopy, flexible; with removal of tumor(s),                        polyp(s), or other lesion(s) by snare technique                       [id number], 79, Colonoscopy, flexible; with biopsy, single                        or multiple Diagnosis Code(s):    --- Professional ---                       K92.2, Gastrointestinal hemorrhage, unspecified                       K64.2, Third degree hemorrhoids                       D12.3, Benign neoplasm of transverse colon (hepatic                        flexure or splenic flexure)                       D12.0, Benign neoplasm of cecum CPT copyright 2016 American Medical Association. All rights reserved. The codes documented in this report are preliminary and upon coder review may  be revised to meet current compliance requirements. Efrain Sella MD, MD  05/09/2017 1:41:36 PM This report has been signed electronically. Number of Addenda: 0 Note Initiated On: 05/09/2017 1:15 PM Scope Withdrawal Time: 0 hours 10 minutes 9 seconds  Total Procedure Duration: 0 hours 13 minutes 34 seconds       [facility]

## 2017-05-09 NOTE — Progress Notes (Signed)
[facility]  ROUNDING NOTE   Subjective:   EGD yesterday. Colonoscopy today.   Hemodialysis treatment yesterday. Tolerated treatment well.   Objective:  Vital signs in last 24 hours:  Temp:  [97.6 F (36.4 C)-98.8 F (37.1 C)] 98.7 F (37.1 C) (11/29 1429) Pulse Rate:  [73-91] 78 (11/29 1429) Resp:  [15-20] 16 (11/29 1350) BP: (91-151)/(45-83) 109/57 (11/29 1429) SpO2:  [98 %-100 %] 98 % (11/29 1429)  Weight change: -5.534 kg (-3.2 oz) Filed Weights   05/07/17 1036 05/08/17 0931 05/08/17 1320  Weight: 96.6 kg (213 lb) 91.1 kg (200 lb 12.8 oz) 90 kg (198 lb 6.6 oz)    Intake/Output: I/O last 3 completed shifts: In: 520 [P.O.:420; I.V.:100] Out: 1500 [Other:1500]   Intake/Output this shift:  Total I/O In: 100 [I.V.:100] Out: 0   Physical Exam: General: NAD  Head: Normocephalic, atraumatic. Moist oral mucosal membranes  Eyes: Anicteric, PERRL  Neck: Supple, trachea midline  Lungs:  Clear to auscultation  Heart: Regular rate and rhythm  Abdomen:  Soft, nontender  Extremities: no peripheral edema.  Neurologic: Nonfocal, moving all four extremities  Skin: No lesions  Access: Left AVF    Basic Metabolic Panel: Recent Labs  Lab 05/07/17 1042 05/08/17 0146  NA 139 138  K 4.6 3.9  CL 98* 101  CO2 25 23  GLUCOSE 136* 81  BUN 44* 56*  CREATININE 5.05* 5.79*  CALCIUM 7.7* 7.6*    Liver Function Tests: Recent Labs  Lab 05/07/17 1042  AST 46*  ALT 24  ALKPHOS 485*  BILITOT 4.8*  PROT 7.5  ALBUMIN 2.8*   Recent Labs  Lab 05/07/17 1042  LIPASE 20   No results for input(s): AMMONIA in the last 168 hours.  CBC: Recent Labs  Lab 05/07/17 1042 05/07/17 1408 05/07/17 1910 05/08/17 0146  WBC 9.2  --   --  7.3  HGB 9.1* 8.9* 8.0* 7.3*  HCT 27.7*  --   --  22.2*  MCV 82.2  --   --  81.4  PLT 160  --   --  141*    Cardiac Enzymes: No results for input(s): CKTOTAL, CKMB, CKMBINDEX, TROPONINI in the last 168 hours.  BNP: Invalid  input(s): POCBNP  CBG: Recent Labs  Lab 05/08/17 1739 05/08/17 2133 05/09/17 0456 05/09/17 0819 05/09/17 1146  GLUCAP 69 118* 91 84 86    Microbiology: Results for orders placed or performed during the hospital encounter of 05/07/17  MRSA PCR Screening     Status: None   Collection Time: 05/07/17  5:59 PM  Result Value Ref Range Status   MRSA by PCR NEGATIVE NEGATIVE Final    Comment:        The GeneXpert MRSA Assay (FDA approved for NASAL specimens only), is one component of a comprehensive MRSA colonization surveillance program. It is not intended to diagnose MRSA infection nor to guide or monitor treatment for MRSA infections.     Coagulation Studies: Recent Labs    05/07/17 1042 05/08/17 1136  LABPROT 40.1* 15.7*  INR 4.19* 1.26    Urinalysis: No results for input(s): COLORURINE, LABSPEC, PHURINE, GLUCOSEU, HGBUR, BILIRUBINUR, KETONESUR, PROTEINUR, UROBILINOGEN, NITRITE, LEUKOCYTESUR in the last 72 hours.  Invalid input(s): APPERANCEUR    Imaging: No results found.   Medications:   . sodium chloride     . calcitRIOL  0.25 mcg Oral Daily  . cinacalcet  60 mg Oral Daily  . epoetin (EPOGEN/PROCRIT) injection  10,000 Units Intravenous Q M,W,F-HD  .  gabapentin  300 mg Oral TID  . hydrALAZINE  50 mg Oral BID  . insulin aspart  0-5 Units Subcutaneous QHS  . insulin aspart  0-9 Units Subcutaneous TID WC  . metoprolol tartrate  25 mg Oral Daily  . multivitamin  1 tablet Oral QHS  . pantoprazole (PROTONIX) IV  40 mg Intravenous Q12H  . rosuvastatin  20 mg Oral QHS   acetaminophen **OR** acetaminophen, benzonatate, ondansetron **OR** ondansetron (ZOFRAN) IV  Assessment/ Plan:  [name] is a 66 y.o. black male  With end stage renal disease on hemodialysis, hypertension, coronary artery disease status post CABG, TIA/CVA, diabetes mellitus insulin dependent, peripheral vascular disease  MWF [facility] Left AVF  1. End Stage  Renal Disease: MWF: Dialysis for tomorrow.   2. Hypertension: blood pressure at goal.  - hydralazine and metoprolol    3. Anemia with chronic kidney disease and GI bleed. Hemoglobin dropping - mircera as outpatient. - Appreciate GI input.   4. Secondary Hyperparathyroidism:  - cinacalcet, calcitriol. Not currently on binders.   5. Malnutrition: albumin 2.8. Poor appetite - check water soluble vitamin levels - Start dialysis vitamin.    LOS: [place], Zhyon Antenucci 11/29/20182:40 PM

## 2017-05-09 NOTE — Progress Notes (Signed)
[place] [facility] [place] NAME: Terry Tyler    MR#:  [id number]  DATE OF BIRTH:  [birth year]  SUBJECTIVE:   Patient came in with coffee-ground emesis and blackish colored stools.  No more emesis.  Pt to get colonoscopy today REVIEW OF SYSTEMS:   Review of Systems  Constitutional: Negative for chills, fever and weight loss.  HENT: Negative for ear discharge, ear pain and nosebleeds.   Eyes: Negative for blurred vision, pain and discharge.  Respiratory: Negative for sputum production, shortness of breath, wheezing and stridor.   Cardiovascular: Negative for chest pain, palpitations, orthopnea and PND.  Gastrointestinal: Negative for abdominal pain, diarrhea, nausea and vomiting.  Genitourinary: Negative for frequency and urgency.  Musculoskeletal: Negative for back pain and joint pain.  Neurological: Negative for sensory change, speech change, focal weakness and weakness.  Psychiatric/Behavioral: Negative for depression and hallucinations. The patient is not nervous/anxious.    Tolerating Diet:npo Tolerating PT: ambulatory  DRUG ALLERGIES:   Allergies  Allergen Reactions  . Ezetimibe Other (See Comments)    Cannot remember what the allergy was.  . Lipitor [Atorvastatin] Other (See Comments)    Muscle cramping    VITALS:  Blood pressure (!) 102/55, pulse 76, temperature 98.1 F (36.7 C), temperature source Tympanic, resp. rate 16, height 6' (1.829 m), weight 90 kg (198 lb 6.6 oz), SpO2 100 %.  PHYSICAL EXAMINATION:   Physical Exam  GENERAL:  66 y.o.-year-old patient lying in the bed with no acute distress.  EYES: Pupils equal, round, reactive to light and accommodation. No scleral icterus. Extraocular muscles intact.  HEENT: Head atraumatic, normocephalic. Oropharynx and nasopharynx clear.  NECK:  Supple, no jugular venous distention. No thyroid enlargement, no tenderness.  LUNGS: Normal breath sounds bilaterally, no wheezing,  rales, rhonchi. No use of accessory muscles of respiration.  CARDIOVASCULAR: S1, S2 normal. No murmurs, rubs, or gallops.  ABDOMEN: Soft, nontender, nondistended. Bowel sounds present. No organomegaly or mass.  EXTREMITIES: No cyanosis, clubbing or edema b/l.    NEUROLOGIC: Cranial nerves II through XII are intact. No focal Motor or sensory deficits b/l.   PSYCHIATRIC:  patient is alert and oriented x 3.  SKIN: No obvious rash, lesion, or ulcer.   LABORATORY PANEL:  CBC Recent Labs  Lab 05/08/17 0146  WBC 7.3  HGB 7.3*  HCT 22.2*  PLT 141*    Chemistries  Recent Labs  Lab 05/07/17 1042 05/08/17 0146  NA 139 138  K 4.6 3.9  CL 98* 101  CO2 25 23  GLUCOSE 136* 81  BUN 44* 56*  CREATININE 5.05* 5.79*  CALCIUM 7.7* 7.6*  AST 46*  --   ALT 24  --   ALKPHOS 485*  --   BILITOT 4.8*  --    Cardiac Enzymes No results for input(s): TROPONINI in the last 168 hours. RADIOLOGY:  No results found. ASSESSMENT AND PLAN:  66 year old male with past history of end-stage renal disease on hemodialysis, secondary hyperparathyroidism, hypertension, hyperlipidemia, history of previous MI, history of coronary artery disease presents to the hospital due to multiple episodes of coffee-ground emesis.  1. GI bleed-this is suspected to be an upper GI bleed given the patient's coffee-ground emesis. 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 -Hold aspirin, Coumadin for now. Patient received some vitamin K in the ER. -Hemoglobin  9.0--8.9--8.0--7.3--7.1 - Keep him nothing by mouth for now.  - gastroenterology consultwith Dr Alice Reichert. ---EGD showed mild gastropathy and angiectasis  s/p argon therapy -on IV Protonix. -transfuse as needed -Colonoscopy showed few oplys a--removed and non bleeding hemroohiods -hgb in am  2. End-stage renal disease on hemodialysis-patient gets dialysis on Monday Wednesday and Friday. -consulted  nephrology.  3. Essential  hypertension-continue metoprolol, hydralazine.  4. Hyperlipidemia-continue Crestor.  5. Neuropathy-continue gabapentin.  6. Diabetes type 2 without complication-we'll place him on sliding scale insulin. Hold Lantus for now.  7. Secondary hyperparathyroidism-continue Sensipar, Rocaltrol.   If remains stable d/c home tomorrow after HD Case discussed with Care Management/Social Worker. Management plans discussed with the patient, family and they are in agreement.  CODE STATUS: FULL  DVT Prophylaxis: SCD  TOTAL TIME TAKING CARE OF THIS PATIENT: *40* minutes.  >50% time spent on counselling and coordination of care  POSSIBLE D/C IN *1-2* DAYS, DEPENDING ON CLINICAL CONDITION.  Note: This dictation was prepared with Dragon dictation along with smaller phrase technology. Any transcriptional errors that result from this process are unintentional.  Fritzi Mandes M.D on 05/09/2017 at 2:16 PM  Between 7am to 6pm - Pager - [phone]  After 6pm go to [email or web address] - password [email or web address] [facility]  Office  [phone]  CC: Primary care physician; Marguerita Merles, MD

## 2017-05-09 NOTE — Progress Notes (Signed)
Inpatient Diabetes Program Recommendations  AACE/ADA: New Consensus Statement on Inpatient Glycemic Control (2015)  Target Ranges:  Prepandial:   less than 140 mg/dL      Peak postprandial:   less than 180 mg/dL (1-2 hours)      Critically ill patients:  140 - 180 mg/dL   Lab Results  Component Value Date   GLUCAP 84 05/09/2017   HGBA1C 7.9 (H) 09/08/2016    Review of Glycemic Control  Results for [name], Terry Tyler (MRN [id number]) as of 05/09/2017 10:50  Ref. Range 05/08/2017 13:55 05/08/2017 17:39 05/08/2017 21:33 05/09/2017 04:56 05/09/2017 08:19  Glucose-Capillary Latest Ref Range: 65 - 99 mg/dL 74 69 118 (H) 91 84     Diabetes history: Type 2 Outpatient Diabetes medications: none Current orders for Inpatient glycemic control: Novolog 0-9 units tid, Novolog 0-5 units qhs  Inpatient Diabetes Program Recommendations:  Patient NPO- please consider -Custom Novolog correction scale 0-5 units q4h      151-200  1 unit      201-250  2 units      251-300  3 units      301-350  4 units      351-400  5 units  Gentry Fitz, RN, [place], [place], CDE Diabetes Coordinator Inpatient Diabetes Program  [phone] (Team Pager) [phone] ([place]) 05/09/2017 10:52 AM

## 2017-05-09 NOTE — Anesthesia Post-op Follow-up Note (Signed)
Anesthesia QCDR form completed.        

## 2017-05-09 NOTE — Anesthesia Postprocedure Evaluation (Signed)
Anesthesia Post Note  Patient: Terry Tyler  Procedure(s) Performed: ESOPHAGOGASTRODUODENOSCOPY (EGD) WITH PROPOFOL (N/A )  Patient location during evaluation: PACU Anesthesia Type: General Level of consciousness: awake and alert and oriented Pain management: pain level controlled Vital Signs Assessment: post-procedure vital signs reviewed and stable Respiratory status: spontaneous breathing Cardiovascular status: blood pressure returned to baseline Anesthetic complications: no     Last Vitals:  Vitals:   05/08/17 1703 05/08/17 2047  BP: 123/60 (!) 151/83  Pulse:  83  Resp:  18  Temp:  36.7 C  SpO2:  98%    Last Pain:  Vitals:   05/08/17 2047  TempSrc: Oral  PainSc:                  Emilie Carp

## 2017-05-09 NOTE — Anesthesia Postprocedure Evaluation (Signed)
Anesthesia Post Note  Patient: Terry Tyler  Procedure(s) Performed: COLONOSCOPY WITH PROPOFOL (N/A )  Patient location during evaluation: Endoscopy Anesthesia Type: General Level of consciousness: awake and alert Pain management: pain level controlled Vital Signs Assessment: post-procedure vital signs reviewed and stable Respiratory status: spontaneous breathing and respiratory function stable Cardiovascular status: stable Anesthetic complications: no     Last Vitals:  Vitals:   05/09/17 1410 05/09/17 1429  BP: (!) 102/55 (!) 109/57  Pulse:  78  Resp:    Temp:  37.1 C  SpO2:  98%    Last Pain:  Vitals:   05/09/17 1429  TempSrc: Oral  PainSc:                  KEPHART,WILLIAM K

## 2017-05-09 NOTE — Interval H&P Note (Signed)
History and Physical Interval Note:  05/09/2017 1:13 PM  Terry Tyler  has presented today for surgery, with the diagnosis of GI bleed, anemia  The various methods of treatment have been discussed with the patient and family. After consideration of risks, benefits and other options for treatment, the patient has consented to  Procedure(s): COLONOSCOPY WITH PROPOFOL (N/A) as a surgical intervention .  The patient's history has been reviewed, patient examined, no change in status, stable for surgery.  I have reviewed the patient's chart and labs.  Questions were answered to the patient's satisfaction.     Lyman, Lupus

## 2017-05-09 NOTE — Anesthesia Procedure Notes (Signed)
Performed by: Cook-Martin, Amron Guerrette Pre-anesthesia Checklist: Patient identified, Emergency Drugs available, Suction available, Patient being monitored and Timeout performed Patient Re-evaluated:Patient Re-evaluated prior to induction Oxygen Delivery Method: Nasal cannula Preoxygenation: Pre-oxygenation with 100% oxygen Induction Type: IV induction Placement Confirmation: positive ETCO2 and CO2 detector       

## 2017-05-10 LAB — PREPARE RBC (CROSSMATCH)

## 2017-05-10 LAB — GLUCOSE, CAPILLARY
GLUCOSE-CAPILLARY: 140 mg/dL — AB (ref 65–99)
Glucose-Capillary: 156 mg/dL — ABNORMAL HIGH (ref 65–99)

## 2017-05-10 LAB — CBC
HCT: 22 % — ABNORMAL LOW (ref 40.0–52.0)
Hemoglobin: 7.3 g/dL — ABNORMAL LOW (ref 13.0–18.0)
MCH: 28 pg (ref 26.0–34.0)
MCHC: 33.4 g/dL (ref 32.0–36.0)
MCV: 83.8 fL (ref 80.0–100.0)
PLATELETS: 131 10*3/uL — AB (ref 150–440)
RBC: 2.62 MIL/uL — AB (ref 4.40–5.90)
RDW: 19.2 % — AB (ref 11.5–14.5)
WBC: 6.5 10*3/uL (ref 3.8–10.6)

## 2017-05-10 LAB — MISC LABCORP TEST (SEND OUT): Labcorp test code: 1479

## 2017-05-10 LAB — HEMOGLOBIN: HEMOGLOBIN: 7.1 g/dL — AB (ref 13.0–18.0)

## 2017-05-10 MED ORDER — SODIUM CHLORIDE 0.9 % IV SOLN: Freq: Once | INTRAVENOUS | Status: DC

## 2017-05-10 MED ORDER — PANTOPRAZOLE SODIUM 40 MG PO TBEC: 40.0000 mg | DELAYED_RELEASE_TABLET | Freq: Two times a day (BID) | ORAL | 1 refills | Status: AC

## 2017-05-10 MED ORDER — VITAMIN C 500 MG PO TABS
500.0000 mg | ORAL_TABLET | Freq: Two times a day (BID) | ORAL | Status: DC
  Filled 2017-05-10 (×4): qty 1

## 2017-05-10 NOTE — Progress Notes (Signed)
[facility]  ROUNDING NOTE   Subjective:   Seen and examined on hemodialysis. Tolerating treatment well. Uf goal of 2 liters.     HEMODIALYSIS FLOWSHEET:  Blood Flow Rate (mL/min): 350 mL/min Arterial Pressure (mmHg): -190 mmHg Venous Pressure (mmHg): 180 mmHg Transmembrane Pressure (mmHg): 70 mmHg Ultrafiltration Rate (mL/min): 300 mL/min Dialysate Flow Rate (mL/min): 600 ml/min Conductivity: Machine : 14 Conductivity: Machine : 14 Dialysis Fluid Bolus: Normal Saline Bolus Amount (mL): 100 mL Dialysate Change: (3k)   Objective:  Vital signs in last 24 hours:  Temp:  [98.1 F (36.7 C)-98.8 F (37.1 C)] 98.4 F (36.9 C) (11/30 0439) Pulse Rate:  [73-91] 83 (11/30 0439) Resp:  [16-20] 16 (11/30 0439) BP: (91-130)/(45-66) 130/61 (11/30 0439) SpO2:  [97 %-100 %] 97 % (11/30 0439)  Weight change:  Filed Weights   05/07/17 1036 05/08/17 0931 05/08/17 1320  Weight: 96.6 kg (213 lb) 91.1 kg (200 lb 12.8 oz) 90 kg (198 lb 6.6 oz)    Intake/Output: I/O last 3 completed shifts: In: 160 [P.O.:60; I.V.:100] Out: 2 [Urine:1; Stool:1]   Intake/Output this shift:  No intake/output data recorded.  Physical Exam: General: NAD  Head: Normocephalic, atraumatic. Moist oral mucosal membranes  Eyes: Anicteric, PERRL  Neck: Supple, trachea midline  Lungs:  Clear to auscultation  Heart: Regular rate and rhythm  Abdomen:  Soft, nontender  Extremities: no peripheral edema.  Neurologic: Nonfocal, moving all four extremities  Skin: No lesions  Access: Left AVF    Basic Metabolic Panel: Recent Labs  Lab 05/07/17 1042 05/08/17 0146  NA 139 138  K 4.6 3.9  CL 98* 101  CO2 25 23  GLUCOSE 136* 81  BUN 44* 56*  CREATININE 5.05* 5.79*  CALCIUM 7.7* 7.6*    Liver Function Tests: Recent Labs  Lab 05/07/17 1042  AST 46*  ALT 24  ALKPHOS 485*  BILITOT 4.8*  PROT 7.5  ALBUMIN 2.8*   Recent Labs  Lab 05/07/17 1042  LIPASE 20   No results for input(s):  AMMONIA in the last 168 hours.  CBC: Recent Labs  Lab 05/07/17 1042 05/07/17 1408 05/07/17 1910 05/08/17 0146 05/10/17 0520  WBC 9.2  --   --  7.3  --   HGB 9.1* 8.9* 8.0* 7.3* 7.1*  HCT 27.7*  --   --  22.2*  --   MCV 82.2  --   --  81.4  --   PLT 160  --   --  141*  --     Cardiac Enzymes: No results for input(s): CKTOTAL, CKMB, CKMBINDEX, TROPONINI in the last 168 hours.  BNP: Invalid input(s): POCBNP  CBG: Recent Labs  Lab 05/09/17 0819 05/09/17 1146 05/09/17 1651 05/09/17 2126 05/10/17 0736  GLUCAP 84 86 110* 143* 156*    Microbiology: Results for orders placed or performed during the hospital encounter of 05/07/17  MRSA PCR Screening     Status: None   Collection Time: 05/07/17  5:59 PM  Result Value Ref Range Status   MRSA by PCR NEGATIVE NEGATIVE Final    Comment:        The GeneXpert MRSA Assay (FDA approved for NASAL specimens only), is one component of a comprehensive MRSA colonization surveillance program. It is not intended to diagnose MRSA infection nor to guide or monitor treatment for MRSA infections.     Coagulation Studies: Recent Labs    05/07/17 1042 05/08/17 1136  LABPROT 40.1* 15.7*  INR 4.19* 1.26    Urinalysis: No  results for input(s): COLORURINE, LABSPEC, [place], GLUCOSEU, HGBUR, BILIRUBINUR, KETONESUR, PROTEINUR, UROBILINOGEN, NITRITE, LEUKOCYTESUR in the last 72 hours.  Invalid input(s): APPERANCEUR    Imaging: No results found.   Medications:    . calcitRIOL  0.25 mcg Oral Daily  . cinacalcet  60 mg Oral Daily  . epoetin (EPOGEN/PROCRIT) injection  10,000 Units Intravenous Q M,W,F-HD  . feeding supplement (NEPRO CARB STEADY)  237 mL Oral BID BM  . gabapentin  300 mg Oral TID  . hydrALAZINE  50 mg Oral BID  . insulin aspart  0-5 Units Subcutaneous QHS  . insulin aspart  0-9 Units Subcutaneous TID WC  . metoprolol tartrate  25 mg Oral Daily  . multivitamin  1 tablet Oral QHS  . multivitamin with minerals   1 tablet Oral Daily  . pantoprazole (PROTONIX) IV  40 mg Intravenous Q12H  . rosuvastatin  20 mg Oral QHS   acetaminophen **OR** acetaminophen, benzonatate, ondansetron **OR** ondansetron (ZOFRAN) IV  Assessment/ Plan:  [name] is a 66 y.o. black male  With end stage renal disease on hemodialysis, hypertension, coronary artery disease status post CABG, TIA/CVA, diabetes mellitus insulin dependent, peripheral vascular disease  MWF [facility] Left AVF  1. End Stage Renal Disease: MWF: Seen and examined on hemodialysis. Tolerating treatment well. AVF functioning without problems.  - Continue MWF schedule.   2. Hypertension: blood pressure at goal.  - hydralazine and metoprolol    3. Anemia with chronic kidney disease and GI bleed. Hemoglobin low at 7.1. Status post EGD and colonoscopy.  - EPO HD treatment today.  - mircera as outpatient. - Appreciate GI input.  - Plan for PRBC transfusion today.   4. Secondary Hyperparathyroidism:  - Continue cinacalcet and calcitriol. Not currently on binders.   5. Malnutrition: albumin 2.8. Poor appetite - pending water soluble vitamin levels - Started dialysis vitamin.    LOS: [place], [place] 11/30/20189:28 AM

## 2017-05-10 NOTE — Care Management (Signed)
Patient was admitted with GI bleed.  PCP Lennox Grumbles, Pharmacy Mail order.  HD liaison has been notified of admission.  Patient open with [facility] for PT and OT, will require resumption orders at discharge.  RNCM following.

## 2017-05-10 NOTE — Progress Notes (Signed)
HD Started 

## 2017-05-10 NOTE — Progress Notes (Signed)
Blood transfusion initiated in HD

## 2017-05-10 NOTE — Care Management Important Message (Signed)
Important Message  Patient Details  Name: ALLARD LIGHTSEY MRN: [id number] Date of Birth: 11-15-1950   Medicare Important Message Given:  Yes    Beverly Sessions, RN 05/10/2017, 2:26 PM

## 2017-05-10 NOTE — Progress Notes (Signed)
PRE HD ASSESSMENT 

## 2017-05-10 NOTE — Discharge Summary (Signed)
[facility] [place] NAME: Terry Tyler    MR#:  [id number]  DATE OF BIRTH:  10-28-1950  DATE OF ADMISSION:  05/07/2017   ADMITTING PHYSICIAN: Henreitta Leber, MD  DATE OF DISCHARGE: 05/10/2017  PRIMARY CARE PHYSICIAN: Marguerita Merles, MD   ADMISSION DIAGNOSIS:   Upper GI bleed [K92.2]  DISCHARGE DIAGNOSIS:   Active Problems:   GI bleed   SECONDARY DIAGNOSIS:   Past Medical History:  Diagnosis Date  . C. difficile diarrhea   . CKD (chronic kidney disease)   . Coronary atherosclerosis of unspecified type of vessel, native or graft    Myocardial infarction in 2006. Cardiac catheterization showed significant three-vessel coronary artery disease. He underwent CABG at [facility]. Most recent cardiac catheterization in 2010 showed normal LV systolic function, patent grafts to OM1, RCA and LAD. Occluded SVG to diagonal.  . 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 Dialysis patient [facility])    Mon. Wed. Fri. for dialysis  . Hyperlipidemia   . Hypertension   . Hypertension, renal disease   . Impotence of organic origin   . Keloid scar   . Myocardial infarction ([place]) 2006  . Neoplasm of uncertain behavior, site unspecified   . Peripheral vascular disease, unspecified ([place])   . Proteinuria   . Stroke [facility]) 04/2016   TIA  . Unspecified vitamin D deficiency     HOSPITAL COURSE:   66 year old male with past history of end-stage renal disease on hemodialysis, secondary hyperparathyroidism, hypertension, hyperlipidemia, history of previous MI, history of coronary artery disease presents to the hospital due to multiple episodes of coffee-ground emesis.  1. GI bleed- appreciate GI consult. Patient had upper endoscopy and also colonoscopy done this admission. -EGD showing gastropathy and inject this is status post argon laser therapy and colonoscopy with several polyps and nonbleeding hemorrhoids. Follow-up biopsy results  as outpatient. -Tolerating diet well. Per GI, patient can be started back on Coumadin for his SVC thrombus. -Discontinue aspirin at discharge. Discharge on PPI -Did receive 1 unit packed RBC transfusion this admission right to discharge.  2. End-stage renal disease on hemodialysis-patient gets dialysis on Monday Wednesday and Friday. -consulted  nephrology -Had dialysis today per schedule..  3. Essential hypertension-continue metoprolol, hydralazine.  4. Acute on chronic anemia-secondary to GI blood losses. Baseline seems to be around 9. -Received one unit transfusion prior to transfusion and outpatient follow-up of hemoglobin. Also Epogen during dialysis will be given.  5. Neuropathy-continue gabapentin.  6. 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 INR to be checked in 2 days and readjust the dose if needed. Keep the INR between 2 and 2.5  7.Secondary hyperparathyroidism-continue Sensipar, Rocaltrol.  Will be discharged today.    DISCHARGE CONDITIONS:   Guarded  CONSULTS OBTAINED:   Treatment Team:  Efrain Sella, MD Lavonia Dana, MD  DRUG ALLERGIES:   Allergies  Allergen Reactions  . Ezetimibe Other (See Comments)    Cannot remember what the allergy was.  . Lipitor [Atorvastatin] Other (See Comments)    Muscle cramping   DISCHARGE MEDICATIONS:   Allergies as of 05/10/2017      Reactions   Ezetimibe Other (See Comments)   Cannot remember what the allergy was.   Lipitor [atorvastatin] Other (See Comments)   Muscle cramping      Medication List    STOP taking these medications   aspirin EC 81 MG tablet  insulin glargine 100 UNIT/ML injection Commonly known as:  LANTUS     TAKE these medications   calcitRIOL 0.25 MCG capsule Commonly known as:  ROCALTROL Take 0.25 mcg by mouth daily.   cinacalcet 60 MG tablet Commonly known as:  SENSIPAR Take 60 mg by mouth daily.   gabapentin 300 MG capsule Commonly known  as:  NEURONTIN Take 300 mg by mouth 3 (three) times daily.   hydrALAZINE 50 MG tablet Commonly known as:  APRESOLINE Take 50 mg by mouth 2 (two) times daily.   metoprolol tartrate 25 MG tablet Commonly known as:  LOPRESSOR Take 25 mg by mouth daily.   pantoprazole 40 MG tablet Commonly known as:  PROTONIX Take 1 tablet (40 mg total) by mouth 2 (two) times daily before a meal.   rosuvastatin 20 MG tablet Commonly known as:  CRESTOR Take 20 mg by mouth at bedtime.   warfarin 4 MG tablet Commonly known as:  COUMADIN Take 4 mg by mouth daily.        DISCHARGE INSTRUCTIONS:   1. PCP follow-up in 1-2 weeks 2. INR check in 2-3 days 3. Follow-up for dialysis on Monday as per schedule  DIET:   Renal diet  ACTIVITY:   Activity as tolerated  OXYGEN:   Home Oxygen: No.  Oxygen Delivery: room air  DISCHARGE LOCATION:   home   If you experience worsening of your admission symptoms, develop shortness of breath, life threatening emergency, suicidal or homicidal thoughts you must seek medical attention immediately by calling 911 or calling your MD immediately  if symptoms less severe.  You Must read complete instructions/literature along with all the possible adverse reactions/side effects for all the Medicines you take and that have been prescribed to you. Take any new Medicines after you have completely understood and accpet all the possible adverse reactions/side effects.   Please note  You were cared for by a hospitalist during your hospital stay. If you have any questions about your discharge medications or the care you received while you were in the hospital after you are discharged, you can call the unit and asked to speak with the hospitalist on call if the hospitalist that took care of you is not available. Once you are discharged, your primary care physician will handle any further medical issues. Please note that NO REFILLS for any discharge medications will be  authorized once you are discharged, as it is imperative that you return to your primary care physician (or establish a relationship with a primary care physician if you do not have one) for your aftercare needs so that they can reassess your need for medications and monitor your lab values.    On the day of Discharge:  VITAL SIGNS:   Blood pressure 125/70, pulse 86, temperature 99.5 F (37.5 C), temperature source Oral, resp. rate 14, height 6' (1.829 m), weight 90 kg (198 lb 6.6 oz), SpO2 100 %.  PHYSICAL EXAMINATION:    GENERAL:  66 y.o.-year-old patient lying in the bed with no acute distress.  EYES: Pupils equal, round, reactive to light and accommodation. No scleral icterus. Extraocular muscles intact.  HEENT: Head atraumatic, normocephalic. Oropharynx and nasopharynx clear.  NECK:  Supple, no jugular venous distention. No thyroid enlargement, no tenderness.  LUNGS: Normal breath sounds bilaterally, no wheezing, rales,rhonchi or crepitation. No use of accessory muscles of respiration.  CARDIOVASCULAR: S1, S2 normal. No murmurs, rubs, or gallops.  ABDOMEN: Soft, non-tender, non-distended. Bowel sounds present. No organomegaly or mass.  EXTREMITIES: No pedal edema, cyanosis, or clubbing. Left arm AV fistula present NEUROLOGIC: Cranial nerves II through XII are intact. Muscle strength 5/5 in all extremities. Sensation intact. Gait not checked.  PSYCHIATRIC: The patient is alert and oriented x 3.  SKIN: No obvious rash, lesion, or ulcer.   DATA REVIEW:   CBC Recent Labs  Lab 05/10/17 1029  WBC 6.5  HGB 7.3*  HCT 22.0*  PLT 131*    Chemistries  Recent Labs  Lab 05/07/17 1042 05/08/17 0146  NA 139 138  K 4.6 3.9  CL 98* 101  CO2 25 23  GLUCOSE 136* 81  BUN 44* 56*  CREATININE 5.05* 5.79*  CALCIUM 7.7* 7.6*  AST 46*  --   ALT 24  --   ALKPHOS 485*  --   BILITOT 4.8*  --      Microbiology Results  Results for orders placed or performed during the hospital  encounter of 05/07/17  MRSA PCR Screening     Status: None   Collection Time: 05/07/17  5:59 PM  Result Value Ref Range Status   MRSA by PCR NEGATIVE NEGATIVE Final    Comment:        The GeneXpert MRSA Assay (FDA approved for NASAL specimens only), is one component of a comprehensive MRSA colonization surveillance program. It is not intended to diagnose MRSA infection nor to guide or monitor treatment for MRSA infections.     RADIOLOGY:  No results found.   Management plans discussed with the patient, family and they are in agreement.  CODE STATUS:     Code Status Orders  (From admission, onward)        Start     Ordered   05/07/17 1320  Full code  Continuous     05/07/17 1320    Code Status History    Date Active Date Inactive Code Status Order ID Comments User Context   02/19/2017 15:35 02/21/2017 15:25 Full Code [id number]  Vaughan Basta, MD Inpatient   01/03/2017 09:26 01/03/2017 14:26 Full Code [id number]  Algernon Huxley, MD Inpatient   09/08/2016 17:58 09/11/2016 14:25 Full Code [id number]  Vaughan Basta, MD Inpatient   04/10/2016 00:35 04/11/2016 15:25 Full Code [id number]  Harvie Bridge, DO Inpatient   04/10/2016 00:35 04/10/2016 00:35 Full Code [id number]  Hugelmeyer, Ubaldo Glassing, DO Inpatient      TOTAL TIME TAKING CARE OF THIS PATIENT: 38 minutes.    Jacques Willingham M.D on 05/10/2017 at 2:34 PM  Between 7am to 6pm - Pager - [phone]  After 6pm go to [email or web address] - Proofreader  [facility]  Office  [phone]  CC: Primary care physician; Marguerita Merles, MD   Note: This dictation was prepared with Dragon dictation along with smaller phrase technology. Any transcriptional errors that result from this process are unintentional.

## 2017-05-10 NOTE — Progress Notes (Signed)
Transfusion completed, pt stable, no adverse reactions.

## 2017-05-10 NOTE — Progress Notes (Signed)
PRE HD   

## 2017-05-10 NOTE — Progress Notes (Signed)
HD TX ended  

## 2017-05-10 NOTE — Care Management (Signed)
Requested resumption home health orders.  Corene Cornea from [facility] notified of discharge. Elvera Bicker HD liaison notified of discharge.

## 2017-05-11 LAB — TYPE AND SCREEN
ABO/RH(D): A POS
ANTIBODY SCREEN: NEGATIVE
Unit division: 0

## 2017-05-11 LAB — BPAM RBC
Blood Product Expiration Date: 201812102359
ISSUE DATE / TIME: 201811301046
Unit Type and Rh: 6200

## 2017-05-13 LAB — SURGICAL PATHOLOGY

## 2017-05-30 LAB — SURGICAL PATHOLOGY

## 2017-05-31 ENCOUNTER — Encounter (INDEPENDENT_AMBULATORY_CARE_PROVIDER_SITE_OTHER): Payer: Self-pay

## 2017-05-31 ENCOUNTER — Other Ambulatory Visit (INDEPENDENT_AMBULATORY_CARE_PROVIDER_SITE_OTHER): Payer: Self-pay | Admitting: Vascular Surgery

## 2017-06-05 MED ORDER — CEFAZOLIN SODIUM-DEXTROSE 1-4 GM/50ML-% IV SOLN
1.0000 g | Freq: Once | INTRAVENOUS | Status: AC
  Administered 2017-06-06: 1 g via INTRAVENOUS

## 2017-06-06 ENCOUNTER — Ambulatory Visit
Admission: RE | Admit: 2017-06-06 | Discharge: 2017-06-06 | Disposition: A | Discharge status: Home / Self Care | Payer: Medicare HMO | Source: Ambulatory Visit | Attending: Vascular Surgery | Admitting: Vascular Surgery

## 2017-06-06 ENCOUNTER — Encounter
Admission: RE | Disposition: A | Discharge status: Home / Self Care | Payer: Self-pay | Source: Ambulatory Visit | Attending: Vascular Surgery

## 2017-06-06 DIAGNOSIS — Z955 Presence of coronary angioplasty implant and graft: Secondary | ICD-10-CM | POA: Diagnosis not present

## 2017-06-06 DIAGNOSIS — I10 Essential (primary) hypertension: Secondary | ICD-10-CM | POA: Diagnosis not present

## 2017-06-06 DIAGNOSIS — Z8673 Personal history of transient ischemic attack (TIA), and cerebral infarction without residual deficits: Secondary | ICD-10-CM | POA: Insufficient documentation

## 2017-06-06 DIAGNOSIS — E1151 Type 2 diabetes mellitus with diabetic peripheral angiopathy without gangrene: Secondary | ICD-10-CM | POA: Diagnosis not present

## 2017-06-06 DIAGNOSIS — N186 End stage renal disease: Secondary | ICD-10-CM | POA: Insufficient documentation

## 2017-06-06 DIAGNOSIS — I252 Old myocardial infarction: Secondary | ICD-10-CM | POA: Diagnosis not present

## 2017-06-06 DIAGNOSIS — Z87891 Personal history of nicotine dependence: Secondary | ICD-10-CM | POA: Insufficient documentation

## 2017-06-06 DIAGNOSIS — Z888 Allergy status to other drugs, medicaments and biological substances status: Secondary | ICD-10-CM | POA: Insufficient documentation

## 2017-06-06 DIAGNOSIS — Z992 Dependence on renal dialysis: Secondary | ICD-10-CM | POA: Insufficient documentation

## 2017-06-06 DIAGNOSIS — I2510 Atherosclerotic heart disease of native coronary artery without angina pectoris: Secondary | ICD-10-CM | POA: Insufficient documentation

## 2017-06-06 DIAGNOSIS — T82858A Stenosis of vascular prosthetic devices, implants and grafts, initial encounter: Secondary | ICD-10-CM | POA: Diagnosis not present

## 2017-06-06 DIAGNOSIS — T82868A Thrombosis of vascular prosthetic devices, implants and grafts, initial encounter: Secondary | ICD-10-CM | POA: Diagnosis not present

## 2017-06-06 DIAGNOSIS — E1122 Type 2 diabetes mellitus with diabetic chronic kidney disease: Secondary | ICD-10-CM | POA: Diagnosis not present

## 2017-06-06 DIAGNOSIS — Y832 Surgical operation with anastomosis, bypass or graft as the cause of abnormal reaction of the patient, or of later complication, without mention of misadventure at the time of the procedure: Secondary | ICD-10-CM | POA: Diagnosis not present

## 2017-06-06 DIAGNOSIS — I120 Hypertensive chronic kidney disease with stage 5 chronic kidney disease or end stage renal disease: Secondary | ICD-10-CM | POA: Diagnosis not present

## 2017-06-06 DIAGNOSIS — E119 Type 2 diabetes mellitus without complications: Secondary | ICD-10-CM | POA: Diagnosis not present

## 2017-06-06 DIAGNOSIS — E785 Hyperlipidemia, unspecified: Secondary | ICD-10-CM | POA: Insufficient documentation

## 2017-06-06 DIAGNOSIS — E559 Vitamin D deficiency, unspecified: Secondary | ICD-10-CM | POA: Insufficient documentation

## 2017-06-06 HISTORY — PX: A/V FISTULAGRAM: CATH118298

## 2017-06-06 HISTORY — PX: A/V SHUNT INTERVENTION: CATH118220

## 2017-06-06 LAB — PROTIME-INR
INR: 2.35
Prothrombin Time: 25.5 seconds — ABNORMAL HIGH (ref 11.4–15.2)

## 2017-06-06 LAB — POTASSIUM (ARMC VASCULAR LAB ONLY): POTASSIUM (ARMC VASCULAR LAB): 3.5 (ref 3.5–5.1)

## 2017-06-06 SURGERY — A/V FISTULAGRAM
Anesthesia: Moderate Sedation

## 2017-06-06 MED ORDER — HEPARIN SODIUM (PORCINE) 1000 UNIT/ML IJ SOLN
INTRAMUSCULAR | Status: AC
  Filled 2017-06-06: qty 1

## 2017-06-06 MED ORDER — HEPARIN (PORCINE) IN NACL 2-0.9 UNIT/ML-% IJ SOLN
INTRAMUSCULAR | Status: AC
  Filled 2017-06-06: qty 1000

## 2017-06-06 MED ORDER — METHYLPREDNISOLONE SODIUM SUCC 125 MG IJ SOLR: 125.0000 mg | INTRAMUSCULAR | Status: DC | PRN

## 2017-06-06 MED ORDER — FENTANYL CITRATE (PF) 100 MCG/2ML IJ SOLN
INTRAMUSCULAR | Status: DC | PRN
  Administered 2017-06-06: 50 ug via INTRAVENOUS

## 2017-06-06 MED ORDER — FENTANYL CITRATE (PF) 100 MCG/2ML IJ SOLN
INTRAMUSCULAR | Status: AC
  Filled 2017-06-06: qty 2

## 2017-06-06 MED ORDER — FAMOTIDINE 20 MG PO TABS: 40.0000 mg | ORAL_TABLET | ORAL | Status: DC | PRN

## 2017-06-06 MED ORDER — MIDAZOLAM HCL 2 MG/2ML IJ SOLN
INTRAMUSCULAR | Status: DC | PRN
  Administered 2017-06-06: 2 mg via INTRAVENOUS

## 2017-06-06 MED ORDER — IOPAMIDOL (ISOVUE-300) INJECTION 61%
INTRAVENOUS | Status: DC | PRN
Start: 2017-06-06 — End: 2017-06-06
  Administered 2017-06-06: 20 mL via INTRA_ARTERIAL

## 2017-06-06 MED ORDER — LIDOCAINE-EPINEPHRINE (PF) 1 %-1:200000 IJ SOLN
INTRAMUSCULAR | Status: AC
Start: 2017-06-06 — End: 2017-06-06
  Filled 2017-06-06: qty 30

## 2017-06-06 MED ORDER — HEPARIN SODIUM (PORCINE) 1000 UNIT/ML IJ SOLN
INTRAMUSCULAR | Status: DC | PRN
  Administered 2017-06-06: 3000 [IU] via INTRAVENOUS

## 2017-06-06 MED ORDER — SODIUM CHLORIDE 0.9 % IV SOLN
INTRAVENOUS | Status: DC
  Administered 2017-06-06: 1000 mL via INTRAVENOUS

## 2017-06-06 MED ORDER — MIDAZOLAM HCL 5 MG/5ML IJ SOLN
INTRAMUSCULAR | Status: AC
  Filled 2017-06-06: qty 5

## 2017-06-06 SURGICAL SUPPLY — 11 items
BALLN LUTONIX DCB 5X60X130 (BALLOONS) ×4
BALLOON LUTONIX DCB 5X60X130 (BALLOONS) ×2 IMPLANT
CANNULA 5F STIFF (CANNULA) ×4 IMPLANT
CATH BEACON 5 .035 40 KMP TP (CATHETERS) ×2 IMPLANT
CATH BEACON 5 .038 40 KMP TP (CATHETERS) ×2
DEVICE PRESTO INFLATION (MISCELLANEOUS) ×4 IMPLANT
DRAPE BRACHIAL (DRAPES) ×4 IMPLANT
PACK ANGIOGRAPHY (CUSTOM PROCEDURE TRAY) ×4 IMPLANT
SHEATH BRITE TIP 6FRX5.5 (SHEATH) ×4 IMPLANT
SUT MNCRL AB 4-0 PS2 18 (SUTURE) ×4 IMPLANT
WIRE MAGIC TOR.035 180C (WIRE) ×4 IMPLANT

## 2017-06-06 NOTE — H&P (Signed)
[facility] Admission History & Physical  MRN : [id number]  Terry Tyler is a 66 y.o. (1951/05/24) male who presents with chief complaint of No chief complaint on file. Marland Kitchen  History of Present Illness: I am asked to evaluate the patient by the dialysis center. The patient was sent here because they were unable to achieve adequate dialysis this morning. Furthermore the Center states there is very poor thrill and bruit. The patient states there there have been increasing problems with the access, such as "pulling clots" during dialysis and prolonged bleeding after decannulation. The patient estimates these problems have been going on for several weeks. The patient is unaware of any other change.  Patient denies pain or tenderness overlying the access.  There is no pain with dialysis.  The patient denies hand pain or finger pain consistent with steal syndrome.   There have not been recent interventions or declots of this access, but did have one intervention many months ago.  The patient is not chronically hypotensive on dialysis.  Current Facility-Administered Medications  Medication Dose Route Frequency Provider Last Rate Last Dose  . 0.9 %  sodium chloride infusion   Intravenous Continuous Stegmayer, Kimberly A, PA-C      . ceFAZolin (ANCEF) IVPB 1 g/50 mL premix  1 g Intravenous Once Stegmayer, Kimberly A, PA-C      . famotidine (PEPCID) tablet 40 mg  40 mg Oral PRN Stegmayer, Kimberly A, PA-C      . methylPREDNISolone sodium succinate (SOLU-MEDROL) 125 mg/2 mL injection 125 mg  125 mg Intravenous PRN Stegmayer, Janalyn Harder, PA-C        Past Medical History:  Diagnosis Date  . C. difficile diarrhea   . CKD (chronic kidney disease)   . Coronary atherosclerosis of unspecified type of vessel, native or graft    Myocardial infarction in 2006. Cardiac catheterization showed significant three-vessel coronary artery disease. He underwent CABG at [facility]. Most recent  cardiac catheterization in 2010 showed normal LV systolic function, patent grafts to OM1, RCA and LAD. Occluded SVG to diagonal.  . 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 Dialysis patient [facility])    Mon. Wed. Fri. for dialysis  . Hyperlipidemia   . Hypertension   . Hypertension, renal disease   . Impotence of organic origin   . Keloid scar   . Myocardial infarction ([place]) 2006  . Neoplasm of uncertain behavior, site unspecified   . Peripheral vascular disease, unspecified ([place])   . Proteinuria   . Stroke [facility]) 04/2016   TIA  . Unspecified vitamin D deficiency     Past Surgical History:  Procedure Laterality Date  . A/V FISTULAGRAM Left 01/03/2017   Procedure: A/V Fistulagram;  Surgeon: Algernon Huxley, MD;  Location: [place] [facility];  Service: Cardiovascular;  Laterality: Left;  . A/V SHUNT INTERVENTION N/A 01/03/2017   Procedure: A/V Shunt Intervention;  Surgeon: Algernon Huxley, MD;  Location: [place] [facility];  Service: Cardiovascular;  Laterality: N/A;  . AV FISTULA PLACEMENT Left 10/25/2016   Procedure: ARTERIOVENOUS (AV) FISTULA CREATION ( RADIOCEPHALIC );  Surgeon: Algernon Huxley, MD;  Location: [facility];  Service: Vascular;  Laterality: Left;  . CARDIAC CATHETERIZATION  12/2004   [facility]  . CARDIAC CATHETERIZATION  09/2008   [facility]  . COLONOSCOPY WITH PROPOFOL N/A 05/09/2017   Procedure: COLONOSCOPY WITH PROPOFOL;  Surgeon: Toledo, Benay Pike, MD;  Location: [facility];  Service: Gastroenterology;  Laterality:  N/A;  . CORONARY ANGIOPLASTY    . CORONARY ARTERY BYPASS GRAFT  2006   [facility]  . CORONARY STENT INTERVENTION N/A 09/10/2016   Procedure: Coronary Stent Intervention;  Surgeon: Isaias Cowman, MD;  Location: [place] [facility];  Service: Cardiovascular;  Laterality: N/A;  . DIALYSIS/PERMA CATHETER REMOVAL N/A 02/14/2017   Procedure: DIALYSIS/PERMA CATHETER REMOVAL;  Surgeon: Algernon Huxley, MD;  Location: [place] [facility];  Service:  Cardiovascular;  Laterality: N/A;  . ESOPHAGOGASTRODUODENOSCOPY (EGD) WITH PROPOFOL N/A 05/08/2017   Procedure: ESOPHAGOGASTRODUODENOSCOPY (EGD) WITH PROPOFOL;  Surgeon: Toledo, Benay Pike, MD;  Location: [facility];  Service: Gastroenterology;  Laterality: N/A;  . keloid removal     right neck  . LEFT HEART CATH AND CORONARY ANGIOGRAPHY N/A 09/10/2016   Procedure: Left Heart Cath and Coronary Angiography;  Surgeon: Isaias Cowman, MD;  Location: [place] [facility];  Service: Cardiovascular;  Laterality: N/A;    Social History Social History   Tobacco Use  . Smoking status: Former Smoker    Packs/day: 0.50    Years: 0.00    Pack years: 0.00    Types: Cigarettes    Last attempt to quit: 06/11/1980    Years since quitting: 37.0  . Smokeless tobacco: Never Used  Substance Use Topics  . Alcohol use: No    Comment: socially  . Drug use: No    Family History Family History  Problem Relation Age of Onset  . Hypertension Mother   . Hyperlipidemia Mother   . Heart disease Mother   . Heart disease Father   . Hyperlipidemia Father   . Hypertension Father   . Kidney disease Sister   . Diabetes Sister   . Hypertension Sister   . Diabetes Brother   . Hypertension Brother   . HIV/AIDS Brother     No family history of bleeding or clotting disorders, autoimmune disease or porphyria  Allergies  Allergen Reactions  . Ezetimibe Other (See Comments)    Cannot remember what the allergy was.  . Lipitor [Atorvastatin] Other (See Comments)    Muscle cramping     REVIEW OF SYSTEMS (Negative unless checked)  Constitutional: [] Weight loss  [] Fever  [] Chills Cardiac: [] Chest pain   [] Chest pressure   [x] Palpitations   [] Shortness of breath when laying flat   [x] Shortness of breath at rest   [x] Shortness of breath with exertion. Vascular:  [] Pain in legs with walking   [] Pain in legs at rest   [] Pain in legs when laying flat   [] Claudication   [] Pain in feet when walking  [] Pain in  feet at rest  [] Pain in feet when laying flat   [] History of DVT   [] Phlebitis   [x] Swelling in legs   [] Varicose veins   [] Non-healing ulcers Pulmonary:   [x] Uses home oxygen   [] Productive cough   [] Hemoptysis   [] Wheeze  [x] COPD   [] Asthma Neurologic:  [] Dizziness  [] Blackouts   [] Seizures   [] History of stroke   [] History of TIA  [] Aphasia   [] Temporary blindness   [] Dysphagia   [] Weakness or numbness in arms   [] Weakness or numbness in legs Musculoskeletal:  [x] Arthritis   [] Joint swelling   [] Joint pain   [] Low back pain Hematologic:  [] Easy bruising  [] Easy bleeding   [] Hypercoagulable state   [x] Anemic  [] Hepatitis Gastrointestinal:  [] Blood in stool   [] Vomiting blood  [x] Gastroesophageal reflux/heartburn   [] Difficulty swallowing. Genitourinary:  [x] Chronic kidney disease   [] Difficult urination  [] Frequent urination  [] Burning with  urination   [] Blood in urine Skin:  [] Rashes   [] Ulcers   [] Wounds Psychological:  [] History of anxiety   []  History of major depression.  Physical Examination  Vitals:   06/06/17 1043  BP: 134/75  Pulse: 80  Resp: 19  Temp: 98.5 F (36.9 C)  TempSrc: Oral  SpO2: 100%  Weight: 89.8 kg (198 lb)  Height: 6' (1.829 m)   Body mass index is 26.85 kg/m. Gen: WD/WN, NAD Head: [address]/AT, No temporalis wasting.  Ear/Nose/Throat: Hearing grossly intact, nares w/o erythema or drainage, oropharynx w/o Erythema/Exudate,  Eyes: Conjunctiva clear, sclera non-icteric Neck: Trachea midline.  No JVD.  Pulmonary:  Good air movement, respirations not labored, no use of accessory muscles on supplemental oxygen.  Cardiac: RRR, normal S1, S2. Vascular: thrill present in left radiocephalic AVF Vessel Right Left  Radial Palpable Palpable               Musculoskeletal: M/S 5/5 throughout.  Extremities without ischemic changes.  No deformity or atrophy.  Neurologic: Sensation grossly intact in extremities.  Symmetrical.  Speech is fluent. Motor exam as listed  above. Psychiatric: Judgment intact, Mood & affect appropriate for pt's clinical situation. Dermatologic: No rashes or ulcers noted.  No cellulitis or open wounds.    CBC Lab Results  Component Value Date   WBC 6.5 05/10/2017   HGB 7.3 (L) 05/10/2017   HCT 22.0 (L) 05/10/2017   MCV 83.8 05/10/2017   PLT 131 (L) 05/10/2017    BMET    Component Value Date/Time   NA 138 05/08/2017 0146   K 3.9 05/08/2017 0146   CL 101 05/08/2017 0146   CO2 23 05/08/2017 0146   GLUCOSE 81 05/08/2017 0146   BUN 56 (H) 05/08/2017 0146   CREATININE 5.79 (H) 05/08/2017 0146   CALCIUM 7.6 (L) 05/08/2017 0146   GFRNONAA 9 (L) 05/08/2017 0146   GFRAA 11 (L) 05/08/2017 0146   CrCl cannot be calculated (Patient's most recent lab result is older than the maximum 21 days allowed.).  COAG Lab Results  Component Value Date   INR 1.26 05/08/2017   INR 4.19 (HH) 05/07/2017   INR 1.10 10/18/2016    Radiology Dg Chest 2 View  Result Date: 05/07/2017 CLINICAL DATA:  Vomiting. EXAM: CHEST  2 VIEW COMPARISON:  Radiograph of February 20, 2017. FINDINGS: Stable cardiomegaly with central pulmonary vascular congestion is noted. Status post coronary artery bypass graft. No pneumothorax or pleural effusion is noted. No consolidative process is noted. Bony thorax is unremarkable. IMPRESSION: Stable cardiomegaly with central pulmonary vascular congestion. Electronically Signed   By: Marijo Conception, M.D.   On: 05/07/2017 13:50    Assessment/Plan 1.  Complication dialysis device with dysfunction of AV access:  Patient's left arm dialysis access is malfunctioning. The patient will undergo angiography and correction of any problems using interventional techniques with the hope of restoring function to the access.  The risks and benefits were described to the patient.  All questions were answered.  The patient agrees to proceed with angiography and intervention. Potassium will be drawn to ensure that it is an  appropriate level prior to performing intervention. 2.  End-stage renal disease requiring hemodialysis:  Patient will continue dialysis therapy without further interruption if a successful intervention is not achieved then a tunneled catheter will be placed. Dialysis has already been arranged. 3.  Hypertension:  Patient will continue medical management; nephrology is following no changes in oral medications. 4.  Diabetes mellitus:  Glucose will  be monitored and oral medications been held this morning once the patient has undergone the patient's procedure po intake will be reinitiated and again Accu-Cheks will be used to assess the blood glucose level and treat as needed. The patient will be restarted on the patient's usual hypoglycemic regime 5.  Coronary artery disease:  EKG will be monitored. Nitrates will be used if needed. The patient's oral cardiac medications will be continued.    [name], MD  06/06/2017 11:12 AM

## 2017-06-06 NOTE — H&P (Signed)
[facility] History & Physical Update  The patient was interviewed and re-examined.  The patient's previous History and Physical has been reviewed and is unchanged.  There is no change in the plan of care. We plan to proceed with the scheduled procedure.  [name], MD  06/06/2017, 10:14 AM

## 2017-06-06 NOTE — Op Note (Signed)
[facility]    OPERATIVE NOTE   PROCEDURE: 1.   Left radiocephalic arteriovenous fistula cannulation under ultrasound guidance 2.   Left arm fistulagram including central venogram 3.   Percutaneous transluminal angioplasty of perianastomotic cephalic vein stenosis with 5 mm diameter by 6 cm length Lutonix drug-coated angioplasty balloon  PRE-OPERATIVE DIAGNOSIS: 1. ESRD 2. Poorly functional left radiocephalic AVF  POST-OPERATIVE DIAGNOSIS: same as above   SURGEON: [name], MD  ANESTHESIA: local with MCS  ESTIMATED BLOOD LOSS: 3 cc  FINDING(S): 1. 80-90% stenosis of the cephalic vein just beyond the radiocephalic anastomosis.  The remainder of the fistula was patent without significant stenosis.  The outflow through the upper arm was largely through the deep venous system in the basilic vein.  The central venous circulation was widely patent.  SPECIMEN(S):  None  CONTRAST: 20 cc  FLUORO TIME: 0.6 minutes  MODERATE CONSCIOUS SEDATION TIME: Approximately 15 minutes with 2 mg of Versed and 50 Mcg of Fentanyl   INDICATIONS: Terry Tyler is a 66 y.o. male who presents with malfunctioning left radiocephalic arteriovenous fistula.  The patient is scheduled for left arm fistulagram.  The patient is aware the risks include but are not limited to: bleeding, infection, thrombosis of the cannulated access, and possible anaphylactic reaction to the contrast.  The patient is aware of the risks of the procedure and elects to proceed forward.  DESCRIPTION: After full informed written consent was obtained, the patient was brought back to the angiography suite and placed supine upon the angiography table.  The patient was connected to monitoring equipment. Moderate conscious sedation was administered with a face to face encounter with the patient throughout the procedure with my supervision of the RN administering medicines and monitoring the patient's vital signs and  mental status throughout from the start of the procedure until the patient was taken to the recovery room. The left arm was prepped and draped in the standard fashion for a percutaneous access intervention.  Under ultrasound guidance, the left radiocephalic arteriovenous fistula was cannulated with a micropuncture needle under direct ultrasound guidance near the antecubital fossa in a retrograde fashion and a permanent image was performed.  The microwire was advanced into the fistula and the needle was exchanged for the a microsheath.  I then upsized to a 6 Fr Sheath and imaging was performed.  Hand injections were completed to image the access including the central venous system. This demonstrated 80-90% stenosis of the cephalic vein just beyond the radiocephalic anastomosis.  The remainder of the fistula was patent without significant stenosis.  The outflow through the upper arm was largely through the deep venous system in the basilic vein.  The central venous circulation was widely patent.  Based on the images, this patient will need intervention to this perianastomotic stenosis. I then gave the patient 3000 units of intravenous heparin.  I then crossed the stenosis with a Magic Tourqe wire.  Based on the imaging, a 5 mm x 6 cm Lutonix drug-coated angioplasty balloon was selected.  The balloon was centered around the perianastomotic cephalic vein stenosis and inflated to 12 ATM for 1 minute(s).  On completion imaging, a 20-25 % residual stenosis was present.     Based on the completion imaging, no further intervention is necessary.  The wire and balloon were removed from the sheath.  A 4-0 Monocryl purse-string suture was sewn around the sheath.  The sheath was removed while tying down the suture.  A sterile  bandage was applied to the puncture site.  COMPLICATIONS: None  CONDITION: Stable   [name]  06/06/2017 1:11 PM   This note was created with Dragon Medical transcription system. Any errors  in dictation are purely unintentional.

## 2017-06-07 ENCOUNTER — Encounter: Payer: Self-pay | Admitting: Vascular Surgery

## 2017-06-16 ENCOUNTER — Encounter: Payer: Self-pay | Admitting: Intensive Care

## 2017-06-16 ENCOUNTER — Emergency Department
Admission: EM | Admit: 2017-06-16 | Discharge: 2017-06-16 | Disposition: A | Discharge status: Home / Self Care | Payer: Medicare HMO | Attending: Emergency Medicine | Admitting: Emergency Medicine

## 2017-06-16 DIAGNOSIS — Z87891 Personal history of nicotine dependence: Secondary | ICD-10-CM | POA: Insufficient documentation

## 2017-06-16 DIAGNOSIS — A0472 Enterocolitis due to Clostridium difficile, not specified as recurrent: Secondary | ICD-10-CM | POA: Insufficient documentation

## 2017-06-16 DIAGNOSIS — I252 Old myocardial infarction: Secondary | ICD-10-CM | POA: Diagnosis not present

## 2017-06-16 DIAGNOSIS — I2510 Atherosclerotic heart disease of native coronary artery without angina pectoris: Secondary | ICD-10-CM | POA: Diagnosis not present

## 2017-06-16 DIAGNOSIS — N186 End stage renal disease: Secondary | ICD-10-CM | POA: Insufficient documentation

## 2017-06-16 DIAGNOSIS — Z7901 Long term (current) use of anticoagulants: Secondary | ICD-10-CM | POA: Diagnosis not present

## 2017-06-16 DIAGNOSIS — Z8673 Personal history of transient ischemic attack (TIA), and cerebral infarction without residual deficits: Secondary | ICD-10-CM | POA: Insufficient documentation

## 2017-06-16 DIAGNOSIS — I120 Hypertensive chronic kidney disease with stage 5 chronic kidney disease or end stage renal disease: Secondary | ICD-10-CM | POA: Insufficient documentation

## 2017-06-16 DIAGNOSIS — E1022 Type 1 diabetes mellitus with diabetic chronic kidney disease: Secondary | ICD-10-CM | POA: Diagnosis not present

## 2017-06-16 DIAGNOSIS — Z79899 Other long term (current) drug therapy: Secondary | ICD-10-CM | POA: Insufficient documentation

## 2017-06-16 DIAGNOSIS — Z992 Dependence on renal dialysis: Secondary | ICD-10-CM | POA: Insufficient documentation

## 2017-06-16 DIAGNOSIS — R197 Diarrhea, unspecified: Secondary | ICD-10-CM | POA: Diagnosis present

## 2017-06-16 LAB — CBC
HCT: 28.1 % — ABNORMAL LOW (ref 40.0–52.0)
Hemoglobin: 9.3 g/dL — ABNORMAL LOW (ref 13.0–18.0)
MCH: 27.3 pg (ref 26.0–34.0)
MCHC: 33 g/dL (ref 32.0–36.0)
MCV: 82.8 fL (ref 80.0–100.0)
PLATELETS: 120 10*3/uL — AB (ref 150–440)
RBC: 3.39 MIL/uL — ABNORMAL LOW (ref 4.40–5.90)
RDW: 17.2 % — ABNORMAL HIGH (ref 11.5–14.5)
WBC: 6 10*3/uL (ref 3.8–10.6)

## 2017-06-16 LAB — COMPREHENSIVE METABOLIC PANEL
ALT: 20 U/L (ref 17–63)
AST: 44 U/L — AB (ref 15–41)
Albumin: 2.9 g/dL — ABNORMAL LOW (ref 3.5–5.0)
Alkaline Phosphatase: 509 U/L — ABNORMAL HIGH (ref 38–126)
Anion gap: 14 (ref 5–15)
BILIRUBIN TOTAL: 5 mg/dL — AB (ref 0.3–1.2)
BUN: 42 mg/dL — AB (ref 6–20)
CALCIUM: 9.7 mg/dL (ref 8.9–10.3)
CO2: 22 mmol/L (ref 22–32)
CREATININE: 7.48 mg/dL — AB (ref 0.61–1.24)
Chloride: 100 mmol/L — ABNORMAL LOW (ref 101–111)
GFR calc Af Amer: 8 mL/min — ABNORMAL LOW (ref >60)
GFR calc non Af Amer: 7 mL/min — ABNORMAL LOW (ref >60)
Glucose, Bld: 124 mg/dL — ABNORMAL HIGH (ref 65–99)
Potassium: 3.7 mmol/L (ref 3.5–5.1)
Sodium: 136 mmol/L (ref 135–145)
TOTAL PROTEIN: 7.6 g/dL (ref 6.5–8.1)

## 2017-06-16 LAB — GASTROINTESTINAL PANEL BY PCR, STOOL (REPLACES STOOL CULTURE)

## 2017-06-16 LAB — LIPASE, BLOOD: Lipase: 24 U/L (ref 11–51)

## 2017-06-16 LAB — C DIFFICILE QUICK SCREEN W PCR REFLEX
C DIFFICILE (CDIFF) TOXIN: NEGATIVE
C Diff antigen: POSITIVE — AB

## 2017-06-16 LAB — CLOSTRIDIUM DIFFICILE BY PCR, REFLEXED: Toxigenic C. Difficile by PCR: NEGATIVE

## 2017-06-16 MED ORDER — VANCOMYCIN HCL 125 MG PO CAPS: 125.0000 mg | ORAL_CAPSULE | Freq: Every day | ORAL | 0 refills | Status: DC

## 2017-06-16 MED ORDER — VANCOMYCIN HCL 125 MG PO CAPS: 125.0000 mg | ORAL_CAPSULE | Freq: Two times a day (BID) | ORAL | 0 refills | Status: AC

## 2017-06-16 MED ORDER — VANCOMYCIN HCL 125 MG PO CAPS: 125.0000 mg | ORAL_CAPSULE | Freq: Four times a day (QID) | ORAL | 0 refills | Status: AC

## 2017-06-16 NOTE — ED Notes (Signed)
Pt requesting water to give urine and stool sample. Pt given water and taken off bp and sp02 monitor - pt was worried he would have to go to the bathroom quickly. Walker also placed in room at family request and nonskid socks in place.

## 2017-06-16 NOTE — ED Notes (Signed)
Pt presents today for diarrhea. Pt states he has diarrhea 3 times a day. Pt states he self treats with imodium and has no relief VS WNL. Pt is A/o x4 Family at bedside. Pt has hx of C-diff but denies antibiotic use with in the last 2 mths. Pt is awaiting EDP.

## 2017-06-16 NOTE — ED Triage Notes (Signed)
Patient reports diarrhea X2 weeks. Dialysis patient M,W,F. Last dialysis trx Jan 2nd. Reports missing Friday due to feeling weak

## 2017-06-16 NOTE — ED Notes (Signed)
Report received from cassie - pt here for diarrhea - no diarrhea since here. Family is at bedside. Pain has no pain with this.

## 2017-06-16 NOTE — Discharge Instructions (Signed)
Please seek medical attention for any high fevers, chest pain, shortness of breath, change in behavior, persistent vomiting, bloody stool or any other new or concerning symptoms.  

## 2017-06-16 NOTE — ED Provider Notes (Signed)
[facility] Emergency Department Provider Note   ____________________________________________   I have reviewed the triage vital signs and the nursing notes.   HISTORY  Chief Complaint Diarrhea   History limited by: Not Limited   HPI Terry Tyler is a 67 y.o. male who presents to the emergency department today because of concern for continued diarrhea  DURATION:> 3 weeks TIMING: constant SEVERITY: severe QUALITY: watery CONTEXT: patient has history of c.dif. States for the past few weeks has been having significant diarrhea. Denies any recent antibiotic use. Picked up a stool sample container on Thursday from PCP but has not returned it. Came in today because he felt like everything he ate over the weekend passed right through him. MODIFYING FACTORS: none ASSOCIATED SYMPTOMS: weakness. No abdominal pain. No fevers. Occasional nausea and vomiting.  Per medical record review patient has a history of c. Dif.   Past Medical History:  Diagnosis Date  . C. difficile diarrhea   . CKD (chronic kidney disease)   . Coronary atherosclerosis of unspecified type of vessel, native or graft    Myocardial infarction in 2006. Cardiac catheterization showed significant three-vessel coronary artery disease. He underwent CABG at [facility]. Most recent cardiac catheterization in 2010 showed normal LV systolic function, patent grafts to OM1, RCA and LAD. Occluded SVG to diagonal.  . 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 Dialysis patient [facility])    Mon. Wed. Fri. for dialysis  . Hyperlipidemia   . Hypertension   . Hypertension, renal disease   . Impotence of organic origin   . Keloid scar   . Myocardial infarction ([place]) 2006  . Neoplasm of uncertain behavior, site unspecified   . Peripheral vascular disease, unspecified ([place])   . Proteinuria   . Stroke [facility]) 04/2016   TIA  . Unspecified vitamin D deficiency     Patient Active Problem List   Diagnosis Date Noted  . GI bleed 05/07/2017  . Diarrhea 02/20/2017  . C. difficile diarrhea 02/19/2017  . ESRD on dialysis ([place]) 10/16/2016  . Elevated troponin 09/08/2016  . CVA (cerebral vascular accident) ([place]) 04/09/2016  . PAD (peripheral artery disease) ([place]) 04/10/2012  . Coronary atherosclerosis of unspecified type of vessel, native or graft   . Hyperlipidemia   . Hypertension     Past Surgical History:  Procedure Laterality Date  . A/V FISTULAGRAM Left 01/03/2017   Procedure: A/V Fistulagram;  Surgeon: Algernon Huxley, MD;  Location: [place] [facility];  Service: Cardiovascular;  Laterality: Left;  . A/V FISTULAGRAM Left 06/06/2017   Procedure: A/V FISTULAGRAM;  Surgeon: Algernon Huxley, MD;  Location: [place] [facility];  Service: Cardiovascular;  Laterality: Left;  . A/V SHUNT INTERVENTION N/A 01/03/2017   Procedure: A/V Shunt Intervention;  Surgeon: Algernon Huxley, MD;  Location: [place] [facility];  Service: Cardiovascular;  Laterality: N/A;  . A/V SHUNT INTERVENTION N/A 06/06/2017   Procedure: A/V SHUNT INTERVENTION;  Surgeon: Algernon Huxley, MD;  Location: [place] [facility];  Service: Cardiovascular;  Laterality: N/A;  . AV FISTULA PLACEMENT Left 10/25/2016   Procedure: ARTERIOVENOUS (AV) FISTULA CREATION ( RADIOCEPHALIC );  Surgeon: Algernon Huxley, MD;  Location: [facility] ORS;  Service: Vascular;  Laterality: Left;  . CARDIAC CATHETERIZATION  12/2004   [facility]  . CARDIAC CATHETERIZATION  09/2008   [facility]  . COLONOSCOPY WITH PROPOFOL N/A 05/09/2017   Procedure: COLONOSCOPY WITH PROPOFOL;  Surgeon: Alice Reichert, Benay Pike, MD;  Location:  [facility] ENDOSCOPY;  Service: Gastroenterology;  Laterality: N/A;  . CORONARY ANGIOPLASTY    . CORONARY ARTERY BYPASS GRAFT  2006   [facility]  . CORONARY STENT INTERVENTION N/A 09/10/2016   Procedure: Coronary Stent Intervention;  Surgeon: Isaias Cowman, MD;  Location: [place] [facility];  Service: Cardiovascular;  Laterality: N/A;  .  DIALYSIS/PERMA CATHETER REMOVAL N/A 02/14/2017   Procedure: DIALYSIS/PERMA CATHETER REMOVAL;  Surgeon: Algernon Huxley, MD;  Location: [place] [facility];  Service: Cardiovascular;  Laterality: N/A;  . ESOPHAGOGASTRODUODENOSCOPY (EGD) WITH PROPOFOL N/A 05/08/2017   Procedure: ESOPHAGOGASTRODUODENOSCOPY (EGD) WITH PROPOFOL;  Surgeon: Toledo, Benay Pike, MD;  Location: [facility] ENDOSCOPY;  Service: Gastroenterology;  Laterality: N/A;  . keloid removal     right neck  . LEFT HEART CATH AND CORONARY ANGIOGRAPHY N/A 09/10/2016   Procedure: Left Heart Cath and Coronary Angiography;  Surgeon: Isaias Cowman, MD;  Location: [place] [facility];  Service: Cardiovascular;  Laterality: N/A;    Prior to Admission medications   Medication Sig Start Date End Date Taking? Authorizing Provider  calcitRIOL (ROCALTROL) 0.25 MCG capsule Take 0.25 mcg by mouth daily.    [provider]  cinacalcet (SENSIPAR) 60 MG tablet Take 60 mg by mouth daily.    [provider]  gabapentin (NEURONTIN) 300 MG capsule Take 300 mg by mouth 3 (three) times daily.    [provider]  hydrALAZINE (APRESOLINE) 50 MG tablet Take 50 mg by mouth 2 (two) times daily.     [provider]  metoprolol tartrate (LOPRESSOR) 25 MG tablet Take 25 mg by mouth daily.    [provider]  pantoprazole (PROTONIX) 40 MG tablet Take 1 tablet (40 mg total) by mouth 2 (two) times daily before a meal. 05/10/17   Gladstone Lighter, MD  rosuvastatin (CRESTOR) 20 MG tablet Take 20 mg by mouth at bedtime.     [provider]  warfarin (COUMADIN) 4 MG tablet Take 4 mg by mouth daily.    [provider]    Allergies Ezetimibe and Lipitor [atorvastatin]  Family History  Problem Relation Age of Onset  . Hypertension Mother   . Hyperlipidemia Mother   . Heart disease Mother   . Heart disease Father   . Hyperlipidemia Father   . Hypertension Father   . Kidney disease Sister   . Diabetes  Sister   . Hypertension Sister   . Diabetes Brother   . Hypertension Brother   . HIV/AIDS Brother     Social History Social History   Tobacco Use  . Smoking status: Former Smoker    Packs/day: 0.50    Years: 0.00    Pack years: 0.00    Types: Cigarettes    Last attempt to quit: 06/11/1980    Years since quitting: 37.0  . Smokeless tobacco: Never Used  Substance Use Topics  . Alcohol use: No    Comment: socially  . Drug use: No    Review of Systems Constitutional: No fever/chills Eyes: No visual changes. ENT: No sore throat. Cardiovascular: Denies chest pain. Respiratory: Denies shortness of breath. Gastrointestinal: Positive for diarrhea.  Genitourinary: Negative for dysuria. Musculoskeletal: Negative for back pain. Skin: Negative for rash. Neurological: Negative for headaches, focal weakness or numbness.  ____________________________________________   PHYSICAL EXAM:  VITAL SIGNS: ED Triage Vitals  Enc Vitals Group     BP 06/16/17 1023 (!) 164/86     Pulse Rate 06/16/17 1023 79     Resp 06/16/17 1023 16  Temp 06/16/17 1023 98.4 F (36.9 C)     Temp Source 06/16/17 1023 Oral     SpO2 06/16/17 1023 99 %     Weight 06/16/17 1024 198 lb (89.8 kg)     Height 06/16/17 1024 6' (1.829 m)     Head Circumference --      Peak Flow --      Pain Score 06/16/17 1023 6   Constitutional: Alert and oriented. Well appearing and in no distress. Eyes: Conjunctivae are normal.  ENT   Head: Normocephalic and atraumatic.   Nose: No congestion/rhinnorhea.   Mouth/Throat: Mucous membranes are moist.   Neck: No stridor. Hematological/Lymphatic/Immunilogical: No cervical lymphadenopathy. Cardiovascular: Normal rate, regular rhythm.  No murmurs, rubs, or gallops. Respiratory: Normal respiratory effort without tachypnea nor retractions. Breath sounds are clear and equal bilaterally. No wheezes/rales/rhonchi. Gastrointestinal: Soft and non tender. No rebound. No  guarding.  Genitourinary: Deferred Musculoskeletal: Normal range of motion in all extremities. No lower extremity edema. Neurologic:  Normal speech and language. No gross focal neurologic deficits are appreciated.  Skin:  Skin is warm, dry and intact. No rash noted. Psychiatric: Mood and affect are normal. Speech and behavior are normal. Patient exhibits appropriate insight and judgment.  ____________________________________________    LABS (pertinent positives/negatives)  C dif antigen positive C dif toxin negative Toxigenic c. Dif by pcr neg Lipase 24 CMP na 136, k 3.7, glu 124, cr 7.45 CBC wbc 6.0, hgb 9.3, plt 120  ____________________________________________   EKG  None  ____________________________________________    RADIOLOGY  None  ____________________________________________   PROCEDURES  Procedures  ____________________________________________   INITIAL IMPRESSION / ASSESSMENT AND PLAN / ED COURSE  Pertinent labs & imaging results that were available during my care of the patient were reviewed by me and considered in my medical decision making (see chart for details).  Patient presented to the emergency department today because of concern for diarrhea and possible c. Dif. Blood work without concerning signs of overwhelming infection or malnutrition. Fecal testing was negative for toxigenic or c. Dif toxic. Discussed with Dr. Alice Reichert and given how symptomatic the patient is he would treat. He will follow up with patient in his office. Discussed results and plan with patient.    ____________________________________________   FINAL CLINICAL IMPRESSION(S) / ED DIAGNOSES  Final diagnoses:  C. difficile diarrhea     Note: This dictation was prepared with Dragon dictation. Any transcriptional errors that result from this process are unintentional     Nance Pear, MD 06/16/17 1547

## 2017-06-16 NOTE — ED Notes (Signed)
Still no urine or stool sample - pt given more water and a lunch tray

## 2017-06-16 NOTE — ED Notes (Signed)
FIRST NURSE NOTE:  Pt reports having multiple episodes of diarrhea and is a dialysis patient.

## 2017-06-19 ENCOUNTER — Telehealth: Payer: Self-pay | Admitting: Emergency Medicine

## 2017-06-19 NOTE — Telephone Encounter (Addendum)
Terry Tyler wade left message asking for c diff results.  Per AVS, patient was given diagnosis on avs.  I called the patient and explained.  Daughter yvette called me back and states the concern is cost of the vancomycin.  I explained that he would need to take due to he is symptomatic.  She says that the liquid is cheaper--as he has been on that before.  She has gi follow up arranged.  Per dr Jimmye Norman the vancomycin can be filled in liquid rather than tablet with same dosage and frequency instructions if that will be cheaper.  [facility] to inform that liquid form is okay if cheaper.

## 2017-07-10 ENCOUNTER — Encounter: Payer: Self-pay | Admitting: Emergency Medicine

## 2017-07-10 ENCOUNTER — Emergency Department
Admission: EM | Admit: 2017-07-10 | Discharge: 2017-07-10 | Disposition: A | Discharge status: Home / Self Care | Payer: Medicare HMO | Attending: Emergency Medicine | Admitting: Emergency Medicine

## 2017-07-10 ENCOUNTER — Other Ambulatory Visit: Payer: Self-pay

## 2017-07-10 ENCOUNTER — Emergency Department: Payer: Medicare HMO

## 2017-07-10 DIAGNOSIS — Z79899 Other long term (current) drug therapy: Secondary | ICD-10-CM | POA: Diagnosis not present

## 2017-07-10 DIAGNOSIS — R142 Eructation: Secondary | ICD-10-CM

## 2017-07-10 DIAGNOSIS — E119 Type 2 diabetes mellitus without complications: Secondary | ICD-10-CM | POA: Insufficient documentation

## 2017-07-10 DIAGNOSIS — Z992 Dependence on renal dialysis: Secondary | ICD-10-CM | POA: Diagnosis not present

## 2017-07-10 DIAGNOSIS — Z951 Presence of aortocoronary bypass graft: Secondary | ICD-10-CM | POA: Diagnosis not present

## 2017-07-10 DIAGNOSIS — J069 Acute upper respiratory infection, unspecified: Secondary | ICD-10-CM | POA: Diagnosis not present

## 2017-07-10 DIAGNOSIS — Z87891 Personal history of nicotine dependence: Secondary | ICD-10-CM | POA: Insufficient documentation

## 2017-07-10 DIAGNOSIS — I120 Hypertensive chronic kidney disease with stage 5 chronic kidney disease or end stage renal disease: Secondary | ICD-10-CM | POA: Insufficient documentation

## 2017-07-10 DIAGNOSIS — N186 End stage renal disease: Secondary | ICD-10-CM | POA: Diagnosis not present

## 2017-07-10 DIAGNOSIS — Z7901 Long term (current) use of anticoagulants: Secondary | ICD-10-CM | POA: Diagnosis not present

## 2017-07-10 DIAGNOSIS — Z8673 Personal history of transient ischemic attack (TIA), and cerebral infarction without residual deficits: Secondary | ICD-10-CM | POA: Insufficient documentation

## 2017-07-10 DIAGNOSIS — R05 Cough: Secondary | ICD-10-CM | POA: Diagnosis present

## 2017-07-10 MED ORDER — PSEUDOEPH-BROMPHEN-DM 30-2-10 MG/5ML PO SYRP: 5.0000 mL | ORAL_SOLUTION | Freq: Four times a day (QID) | ORAL | 0 refills | Status: DC | PRN

## 2017-07-10 MED ORDER — SIMETHICONE 250 MG PO CAPS: 1.0000 | ORAL_CAPSULE | ORAL | 0 refills | Status: DC

## 2017-07-10 MED ORDER — SIMETHICONE 80 MG PO CHEW
160.0000 mg | CHEWABLE_TABLET | Freq: Once | ORAL | Status: AC
  Administered 2017-07-10: 160 mg via ORAL
  Filled 2017-07-10: qty 2

## 2017-07-10 NOTE — ED Notes (Signed)
This Rn received call from Summer from dialysis center and wanted an update on the pt, and left her number [phone] for an update.

## 2017-07-10 NOTE — ED Provider Notes (Signed)
[facility] Emergency Department Provider Note  ____________________________________________   First MD Initiated Contact with Patient 07/10/17 1449     (approximate)  I have reviewed the triage vital signs and the nursing notes.   HISTORY  Chief Complaint Cough   HPI Terry Tyler is a 67 y.o. male is brought in today by caregivers after patient had dialysis.  He states that he has had nasal congestion and cough which has not improved.  Patient states that he did finish his full dialysis treatment today.  He is unaware of any fever or chills.  He denies any ear pain.  Patient was a smoker at 1/2 pack cigarettes a day before he discontinued smoking.  He also has had some burping.   Past Medical History:  Diagnosis Date  . C. difficile diarrhea   . CKD (chronic kidney disease)   . Coronary atherosclerosis of unspecified type of vessel, native or graft    Myocardial infarction in 2006. Cardiac catheterization showed significant three-vessel coronary artery disease. He underwent CABG at [facility]. Most recent cardiac catheterization in 2010 showed normal LV systolic function, patent grafts to OM1, RCA and LAD. Occluded SVG to diagonal.  . 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 Dialysis patient [facility])    Mon. Wed. Fri. for dialysis  . Hyperlipidemia   . Hypertension   . Hypertension, renal disease   . Impotence of organic origin   . Keloid scar   . Myocardial infarction ([place]) 2006  . Neoplasm of uncertain behavior, site unspecified   . Peripheral vascular disease, unspecified ([place])   . Proteinuria   . Stroke [facility]) 04/2016   TIA  . Unspecified vitamin D deficiency     Patient Active Problem List   Diagnosis Date Noted  . GI bleed 05/07/2017  . Diarrhea 02/20/2017  . C. difficile diarrhea 02/19/2017  . ESRD on dialysis ([place]) 10/16/2016  . Elevated troponin 09/08/2016  . CVA (cerebral vascular accident) ([place]) 04/09/2016  . PAD  (peripheral artery disease) ([place]) 04/10/2012  . Coronary atherosclerosis of unspecified type of vessel, native or graft   . Hyperlipidemia   . Hypertension     Past Surgical History:  Procedure Laterality Date  . A/V FISTULAGRAM Left 01/03/2017   Procedure: A/V Fistulagram;  Surgeon: Algernon Huxley, MD;  Location: [place] CV LAB;  Service: Cardiovascular;  Laterality: Left;  . A/V FISTULAGRAM Left 06/06/2017   Procedure: A/V FISTULAGRAM;  Surgeon: Algernon Huxley, MD;  Location: [place] CV LAB;  Service: Cardiovascular;  Laterality: Left;  . A/V SHUNT INTERVENTION N/A 01/03/2017   Procedure: A/V Shunt Intervention;  Surgeon: Algernon Huxley, MD;  Location: [place] CV LAB;  Service: Cardiovascular;  Laterality: N/A;  . A/V SHUNT INTERVENTION N/A 06/06/2017   Procedure: A/V SHUNT INTERVENTION;  Surgeon: Algernon Huxley, MD;  Location: [place] CV LAB;  Service: Cardiovascular;  Laterality: N/A;  . AV FISTULA PLACEMENT Left 10/25/2016   Procedure: ARTERIOVENOUS (AV) FISTULA CREATION ( RADIOCEPHALIC );  Surgeon: Algernon Huxley, MD;  Location: [facility] ORS;  Service: Vascular;  Laterality: Left;  . CARDIAC CATHETERIZATION  12/2004   [facility]  . CARDIAC CATHETERIZATION  09/2008   [facility];[place]  . COLONOSCOPY WITH PROPOFOL N/A 05/09/2017   Procedure: COLONOSCOPY WITH PROPOFOL;  Surgeon: Toledo, Benay Pike, MD;  Location: [facility] ENDOSCOPY;  Service: Gastroenterology;  Laterality: N/A;  . CORONARY ANGIOPLASTY    . CORONARY ARTERY BYPASS GRAFT  2006  [facility]  . CORONARY STENT INTERVENTION N/A 09/10/2016   Procedure: Coronary Stent Intervention;  Surgeon: Isaias Cowman, MD;  Location: [place] CV LAB;  Service: Cardiovascular;  Laterality: N/A;  . DIALYSIS/PERMA CATHETER REMOVAL N/A 02/14/2017   Procedure: DIALYSIS/PERMA CATHETER REMOVAL;  Surgeon: Algernon Huxley, MD;  Location: [place] CV LAB;  Service: Cardiovascular;  Laterality: N/A;  . ESOPHAGOGASTRODUODENOSCOPY (EGD) WITH  PROPOFOL N/A 05/08/2017   Procedure: ESOPHAGOGASTRODUODENOSCOPY (EGD) WITH PROPOFOL;  Surgeon: Toledo, Benay Pike, MD;  Location: [facility] ENDOSCOPY;  Service: Gastroenterology;  Laterality: N/A;  . keloid removal     right neck  . LEFT HEART CATH AND CORONARY ANGIOGRAPHY N/A 09/10/2016   Procedure: Left Heart Cath and Coronary Angiography;  Surgeon: Isaias Cowman, MD;  Location: [place] CV LAB;  Service: Cardiovascular;  Laterality: N/A;    Prior to Admission medications   Medication Sig Start Date End Date Taking? Authorizing Provider  brompheniramine-pseudoephedrine-DM 30-2-10 MG/5ML syrup Take 5 mLs by mouth 4 (four) times daily as needed. 07/10/17   Johnn Hai, PA-C  calcitRIOL (ROCALTROL) 0.25 MCG capsule Take 0.25 mcg by mouth daily.    [provider]  cinacalcet (SENSIPAR) 60 MG tablet Take 60 mg by mouth daily.    [provider]  gabapentin (NEURONTIN) 300 MG capsule Take 300 mg by mouth 3 (three) times daily.    [provider]  hydrALAZINE (APRESOLINE) 50 MG tablet Take 50 mg by mouth 2 (two) times daily.     [provider]  metoprolol tartrate (LOPRESSOR) 25 MG tablet Take 25 mg by mouth daily.    [provider]  pantoprazole (PROTONIX) 40 MG tablet Take 1 tablet (40 mg total) by mouth 2 (two) times daily before a meal. 05/10/17   Gladstone Lighter, MD  rosuvastatin (CRESTOR) 20 MG tablet Take 20 mg by mouth at bedtime.     [provider]  Simethicone 250 MG CAPS Take 1 capsule by mouth 1 day or 1 dose for 1 dose. 07/10/17 07/11/17  Johnn Hai, PA-C  vancomycin (VANCOCIN) 125 MG capsule Take 1 capsule (125 mg total) by mouth daily. To start after finishing the twice a day prescription 06/16/17   Nance Pear, MD  warfarin (COUMADIN) 4 MG tablet Take 4 mg by mouth daily.    [provider]    Allergies Ezetimibe and Lipitor [atorvastatin]  Family History  Problem Relation Age of Onset  .  Hypertension Mother   . Hyperlipidemia Mother   . Heart disease Mother   . Heart disease Father   . Hyperlipidemia Father   . Hypertension Father   . Kidney disease Sister   . Diabetes Sister   . Hypertension Sister   . Diabetes Brother   . Hypertension Brother   . HIV/AIDS Brother     Social History Social History   Tobacco Use  . Smoking status: Former Smoker    Packs/day: 0.50    Years: 0.00    Pack years: 0.00    Types: Cigarettes    Last attempt to quit: 06/11/1980    Years since quitting: 37.1  . Smokeless tobacco: Never Used  Substance Use Topics  . Alcohol use: No    Comment: socially  . Drug use: No    Review of Systems Constitutional: No fever/chills Eyes: No visual changes. ENT: Positive nasal congestion. Cardiovascular: Denies chest pain. Respiratory: Denies shortness of breath.  Positive cough. Gastrointestinal: No abdominal pain.  No nausea, no vomiting.  No diarrhea.  Negative for constipation.  Positive for burping. Musculoskeletal: Negative for back pain. Skin: Negative for rash. Neurological: Negative for headaches, focal weakness or numbness. ____________________________________________   PHYSICAL EXAM:  VITAL SIGNS: ED Triage Vitals [07/10/17 1334]  Enc Vitals Group     BP (!) 143/73     Pulse Rate 85     Resp 16     Temp 98.1 F (36.7 C)     Temp Source Oral     SpO2 97 %     Weight 199 lb (90.3 kg)     Height      Head Circumference      Peak Flow      Pain Score      Pain Loc      Pain Edu?      Excl. in [place]?    Constitutional: Alert and oriented. Well appearing and in no acute distress.  Patient is sitting in wheelchair and is cooperative for the exam. Eyes: Conjunctivae are normal.  Head: Atraumatic. Nose: Mild congestion/no current rhinnorhea.  TMs are dull bilaterally. Mouth/Throat: Mucous membranes are moist.  Oropharynx non-erythematous.  Mild posterior drainage noted no exudate is present. Neck: No stridor.     Hematological/Lymphatic/Immunilogical: No cervical lymphadenopathy. Cardiovascular: Normal rate, regular rhythm. Grossly normal heart sounds.  Good peripheral circulation. Respiratory: Normal respiratory effort.  No retractions. Lungs CTAB. Gastrointestinal: Soft and nontender. No distention.  Musculoskeletal: Moves upper and lower extremities without assistance. Neurologic:  Normal speech and language. No gross focal neurologic deficits are appreciated.  Skin:  Skin is warm, dry and intact. No rash noted. Psychiatric: Mood and affect are normal. Speech and behavior are normal.  ____________________________________________   LABS (all labs ordered are listed, but only abnormal results are displayed)  Labs Reviewed - No data to display  RADIOLOGY  No change from x-ray taken November 2018.  Patient has cardiac enlargement.  No evidence of pneumonia.  ____________________________________________   PROCEDURES  Procedure(s) performed: None  Procedures  Critical Care performed: No  ____________________________________________   INITIAL IMPRESSION / ASSESSMENT AND PLAN / ED COURSE Patient was made aware that his chest x-ray looked very similar to the x-ray that he had done in November 2018.  Patient states while in the ED he is also had another bowel movement.  Patient states he basically wants something for his runny nose and congestion.  He was given a prescription for Bromfed-DM as needed for cough and congestion.  He was also given a prescription for simethicone to take for gas.  He is to follow-up with his PCP at [facility] clinic if any continued problems.   ____________________________________________   FINAL CLINICAL IMPRESSION(S) / ED DIAGNOSES  Final diagnoses:  Acute upper respiratory infection  Burping     ED Discharge Orders        Ordered    brompheniramine-pseudoephedrine-DM 30-2-10 MG/5ML syrup  4 times daily PRN     07/10/17 1615    Simethicone 250 MG CAPS   1 Day/Dose     07/10/17 1617       Note:  This document was prepared using Dragon voice recognition software and may include unintentional dictation errors.    Johnn Hai, PA-C 07/10/17 1630    Earleen Newport, MD 07/13/17 [phone]

## 2017-07-10 NOTE — Discharge Instructions (Signed)
Begin taking Bromfed-DM as needed for cough and runny nose.  Begin taking simethicone once daily as needed for burping and gas.  Avoid caffeine, carbonated drinks such as sodas, chewing gum, raw Vegetables.   Follow-up with your doctor at [facility] clinic if any continued problems.

## 2017-07-10 NOTE — ED Notes (Addendum)
Pt presents today for congestion in chest.  Pt states he finished his treatment and 2 lbs were taken off. Pt states dialysis sent him for chest xray. Pt has 2 caregivers at bedside.  Sent from diaylsis NAD awaiting EDP.

## 2017-07-10 NOTE — ED Triage Notes (Signed)
Pt to ED via POV. Pt states that he was sent by dialysis for congestion. Pt states that he has been cough and can get sputum up sometimes but it is clear. Pt states that he was able to finish his full treatment today. Pt in NAD at this time.

## 2017-07-16 ENCOUNTER — Inpatient Hospital Stay
Admission: EM | Admit: 2017-07-16 | Discharge: 2017-07-22 | DRG: 291 | Disposition: A | Discharge status: Home Health | Payer: Medicare HMO | Attending: Family Medicine | Admitting: Family Medicine

## 2017-07-16 ENCOUNTER — Emergency Department: Payer: Medicare HMO

## 2017-07-16 ENCOUNTER — Inpatient Hospital Stay
Admit: 2017-07-16 | Discharge: 2017-07-16 | Disposition: A | Discharge status: Home / Self Care | Payer: Medicare HMO | Attending: Internal Medicine | Admitting: Internal Medicine

## 2017-07-16 ENCOUNTER — Encounter: Payer: Self-pay | Admitting: Emergency Medicine

## 2017-07-16 ENCOUNTER — Ambulatory Visit: Payer: Medicare HMO | Admitting: Podiatry

## 2017-07-16 ENCOUNTER — Encounter (INDEPENDENT_AMBULATORY_CARE_PROVIDER_SITE_OTHER): Payer: Medicare HMO

## 2017-07-16 ENCOUNTER — Ambulatory Visit (INDEPENDENT_AMBULATORY_CARE_PROVIDER_SITE_OTHER): Payer: Medicare HMO | Admitting: Vascular Surgery

## 2017-07-16 ENCOUNTER — Other Ambulatory Visit: Payer: Self-pay

## 2017-07-16 DIAGNOSIS — Z955 Presence of coronary angioplasty implant and graft: Secondary | ICD-10-CM | POA: Diagnosis not present

## 2017-07-16 DIAGNOSIS — Z951 Presence of aortocoronary bypass graft: Secondary | ICD-10-CM | POA: Diagnosis not present

## 2017-07-16 DIAGNOSIS — E1051 Type 1 diabetes mellitus with diabetic peripheral angiopathy without gangrene: Secondary | ICD-10-CM | POA: Diagnosis present

## 2017-07-16 DIAGNOSIS — I25810 Atherosclerosis of coronary artery bypass graft(s) without angina pectoris: Secondary | ICD-10-CM | POA: Diagnosis present

## 2017-07-16 DIAGNOSIS — D631 Anemia in chronic kidney disease: Secondary | ICD-10-CM | POA: Diagnosis present

## 2017-07-16 DIAGNOSIS — Z992 Dependence on renal dialysis: Secondary | ICD-10-CM

## 2017-07-16 DIAGNOSIS — Z888 Allergy status to other drugs, medicaments and biological substances status: Secondary | ICD-10-CM

## 2017-07-16 DIAGNOSIS — Z8673 Personal history of transient ischemic attack (TIA), and cerebral infarction without residual deficits: Secondary | ICD-10-CM | POA: Diagnosis not present

## 2017-07-16 DIAGNOSIS — I252 Old myocardial infarction: Secondary | ICD-10-CM

## 2017-07-16 DIAGNOSIS — N2581 Secondary hyperparathyroidism of renal origin: Secondary | ICD-10-CM | POA: Diagnosis present

## 2017-07-16 DIAGNOSIS — N186 End stage renal disease: Secondary | ICD-10-CM | POA: Diagnosis present

## 2017-07-16 DIAGNOSIS — R0602 Shortness of breath: Secondary | ICD-10-CM

## 2017-07-16 DIAGNOSIS — I509 Heart failure, unspecified: Secondary | ICD-10-CM

## 2017-07-16 DIAGNOSIS — Z87891 Personal history of nicotine dependence: Secondary | ICD-10-CM

## 2017-07-16 DIAGNOSIS — J9621 Acute and chronic respiratory failure with hypoxia: Secondary | ICD-10-CM | POA: Diagnosis present

## 2017-07-16 DIAGNOSIS — I132 Hypertensive heart and chronic kidney disease with heart failure and with stage 5 chronic kidney disease, or end stage renal disease: Secondary | ICD-10-CM | POA: Diagnosis present

## 2017-07-16 DIAGNOSIS — J101 Influenza due to other identified influenza virus with other respiratory manifestations: Secondary | ICD-10-CM | POA: Diagnosis present

## 2017-07-16 DIAGNOSIS — E1022 Type 1 diabetes mellitus with diabetic chronic kidney disease: Secondary | ICD-10-CM | POA: Diagnosis present

## 2017-07-16 DIAGNOSIS — Z7901 Long term (current) use of anticoagulants: Secondary | ICD-10-CM | POA: Diagnosis not present

## 2017-07-16 DIAGNOSIS — I429 Cardiomyopathy, unspecified: Secondary | ICD-10-CM | POA: Diagnosis present

## 2017-07-16 DIAGNOSIS — Z872 Personal history of diseases of the skin and subcutaneous tissue: Secondary | ICD-10-CM

## 2017-07-16 DIAGNOSIS — D696 Thrombocytopenia, unspecified: Secondary | ICD-10-CM | POA: Diagnosis present

## 2017-07-16 DIAGNOSIS — I739 Peripheral vascular disease, unspecified: Secondary | ICD-10-CM | POA: Diagnosis present

## 2017-07-16 DIAGNOSIS — I5023 Acute on chronic systolic (congestive) heart failure: Secondary | ICD-10-CM | POA: Diagnosis present

## 2017-07-16 DIAGNOSIS — A044 Other intestinal Escherichia coli infections: Secondary | ICD-10-CM | POA: Diagnosis present

## 2017-07-16 DIAGNOSIS — I248 Other forms of acute ischemic heart disease: Secondary | ICD-10-CM | POA: Diagnosis present

## 2017-07-16 DIAGNOSIS — E785 Hyperlipidemia, unspecified: Secondary | ICD-10-CM | POA: Diagnosis present

## 2017-07-16 DIAGNOSIS — Z79899 Other long term (current) drug therapy: Secondary | ICD-10-CM

## 2017-07-16 DIAGNOSIS — R531 Weakness: Secondary | ICD-10-CM

## 2017-07-16 DIAGNOSIS — Z8619 Personal history of other infectious and parasitic diseases: Secondary | ICD-10-CM

## 2017-07-16 LAB — COMPREHENSIVE METABOLIC PANEL
ALT: 33 U/L (ref 17–63)
ANION GAP: 15 (ref 5–15)
AST: 84 U/L — ABNORMAL HIGH (ref 15–41)
Albumin: 3.3 g/dL — ABNORMAL LOW (ref 3.5–5.0)
Alkaline Phosphatase: 477 U/L — ABNORMAL HIGH (ref 38–126)
BUN: 20 mg/dL (ref 6–20)
CHLORIDE: 90 mmol/L — AB (ref 101–111)
CO2: 29 mmol/L (ref 22–32)
Calcium: 9.2 mg/dL (ref 8.9–10.3)
Creatinine, Ser: 4.69 mg/dL — ABNORMAL HIGH (ref 0.61–1.24)
GFR calc Af Amer: 14 mL/min — ABNORMAL LOW (ref >60)
GFR calc non Af Amer: 12 mL/min — ABNORMAL LOW (ref >60)
GLUCOSE: 183 mg/dL — AB (ref 65–99)
POTASSIUM: 4 mmol/L (ref 3.5–5.1)
SODIUM: 134 mmol/L — AB (ref 135–145)
TOTAL PROTEIN: 8.6 g/dL — AB (ref 6.5–8.1)
Total Bilirubin: 5.9 mg/dL — ABNORMAL HIGH (ref 0.3–1.2)

## 2017-07-16 LAB — GLUCOSE, CAPILLARY
Glucose-Capillary: 174 mg/dL — ABNORMAL HIGH (ref 65–99)
Glucose-Capillary: 173 mg/dL — ABNORMAL HIGH (ref 65–99)
Glucose-Capillary: 186 mg/dL — ABNORMAL HIGH (ref 65–99)

## 2017-07-16 LAB — CBC
HCT: 32.3 % — ABNORMAL LOW (ref 40.0–52.0)
Hemoglobin: 10.7 g/dL — ABNORMAL LOW (ref 13.0–18.0)
MCH: 27 pg (ref 26.0–34.0)
MCHC: 33.2 g/dL (ref 32.0–36.0)
MCV: 81.3 fL (ref 80.0–100.0)
PLATELETS: 88 10*3/uL — AB (ref 150–440)
RBC: 3.97 MIL/uL — ABNORMAL LOW (ref 4.40–5.90)
RDW: 16.4 % — AB (ref 11.5–14.5)
WBC: 8.4 10*3/uL (ref 3.8–10.6)

## 2017-07-16 LAB — ECHOCARDIOGRAM COMPLETE

## 2017-07-16 LAB — PROTIME-INR
INR: 1.25
Prothrombin Time: 15.6 seconds — ABNORMAL HIGH (ref 11.4–15.2)

## 2017-07-16 LAB — TROPONIN I
TROPONIN I: 0.49 ng/mL — AB (ref <0.03)
TROPONIN I: 0.7 ng/mL — AB (ref <0.03)
Troponin I: 0.42 ng/mL (ref <0.03)

## 2017-07-16 LAB — INFLUENZA PANEL BY PCR (TYPE A & B)
INFLBPCR: NEGATIVE
Influenza A By PCR: POSITIVE — AB

## 2017-07-16 LAB — CK: CK TOTAL: 130 U/L (ref 49–397)

## 2017-07-16 MED ORDER — SIMETHICONE 80 MG PO CHEW
80.0000 mg | CHEWABLE_TABLET | Freq: Every day | ORAL | Status: DC | PRN
  Filled 2017-07-16: qty 1

## 2017-07-16 MED ORDER — OSELTAMIVIR PHOSPHATE 30 MG PO CAPS: 30.0000 mg | ORAL_CAPSULE | Freq: Two times a day (BID) | ORAL | Status: DC

## 2017-07-16 MED ORDER — WARFARIN - PHARMACIST DOSING INPATIENT
Freq: Every day | Status: DC
  Administered 2017-07-20: 19:00:00
  Filled 2017-07-16 (×2): qty 1

## 2017-07-16 MED ORDER — ONDANSETRON HCL 4 MG PO TABS: 4.0000 mg | ORAL_TABLET | Freq: Four times a day (QID) | ORAL | Status: DC | PRN

## 2017-07-16 MED ORDER — OSELTAMIVIR PHOSPHATE 30 MG PO CAPS
30.0000 mg | ORAL_CAPSULE | ORAL | Status: AC
  Administered 2017-07-17 – 2017-07-19 (×2): 30 mg via ORAL
  Filled 2017-07-16 (×2): qty 1

## 2017-07-16 MED ORDER — ROSUVASTATIN CALCIUM 10 MG PO TABS
20.0000 mg | ORAL_TABLET | Freq: Every day | ORAL | Status: DC
  Administered 2017-07-16 – 2017-07-21 (×6): 20 mg via ORAL
  Filled 2017-07-16: qty 2
  Filled 2017-07-16: qty 1
  Filled 2017-07-16: qty 2
  Filled 2017-07-16: qty 1
  Filled 2017-07-16 (×4): qty 2

## 2017-07-16 MED ORDER — WARFARIN SODIUM 6 MG PO TABS
6.0000 mg | ORAL_TABLET | Freq: Once | ORAL | Status: AC
  Administered 2017-07-16: 6 mg via ORAL
  Filled 2017-07-16: qty 1

## 2017-07-16 MED ORDER — ONDANSETRON HCL 4 MG/2ML IJ SOLN
4.0000 mg | Freq: Four times a day (QID) | INTRAMUSCULAR | Status: DC | PRN
  Administered 2017-07-16: 4 mg via INTRAVENOUS
  Filled 2017-07-16 (×2): qty 2

## 2017-07-16 MED ORDER — OSELTAMIVIR PHOSPHATE 30 MG PO CAPS: 30.0000 mg | ORAL_CAPSULE | Freq: Every day | ORAL | Status: DC

## 2017-07-16 MED ORDER — INSULIN ASPART 100 UNIT/ML ~~LOC~~ SOLN
0.0000 [IU] | Freq: Three times a day (TID) | SUBCUTANEOUS | Status: DC
  Administered 2017-07-16 – 2017-07-17 (×4): 3 [IU] via SUBCUTANEOUS
  Administered 2017-07-18: 2 [IU] via SUBCUTANEOUS
  Administered 2017-07-18: 3 [IU] via SUBCUTANEOUS
  Administered 2017-07-18 – 2017-07-19 (×2): 5 [IU] via SUBCUTANEOUS
  Administered 2017-07-19 – 2017-07-20 (×2): 3 [IU] via SUBCUTANEOUS
  Administered 2017-07-20 – 2017-07-21 (×2): 5 [IU] via SUBCUTANEOUS
  Administered 2017-07-21: 8 [IU] via SUBCUTANEOUS
  Administered 2017-07-21: 11 [IU] via SUBCUTANEOUS
  Administered 2017-07-22: 3 [IU] via SUBCUTANEOUS
  Filled 2017-07-16 (×15): qty 1

## 2017-07-16 MED ORDER — PANTOPRAZOLE SODIUM 40 MG PO TBEC
40.0000 mg | DELAYED_RELEASE_TABLET | Freq: Two times a day (BID) | ORAL | Status: DC
  Administered 2017-07-16 – 2017-07-22 (×11): 40 mg via ORAL
  Filled 2017-07-16 (×11): qty 1

## 2017-07-16 MED ORDER — WARFARIN SODIUM 4 MG PO TABS: 4.0000 mg | ORAL_TABLET | Freq: Every day | ORAL | Status: DC

## 2017-07-16 MED ORDER — ACETAMINOPHEN 325 MG PO TABS: 650.0000 mg | ORAL_TABLET | Freq: Four times a day (QID) | ORAL | Status: DC | PRN

## 2017-07-16 MED ORDER — METOPROLOL TARTRATE 25 MG PO TABS
25.0000 mg | ORAL_TABLET | Freq: Every day | ORAL | Status: DC
  Administered 2017-07-16: 25 mg via ORAL
  Filled 2017-07-16: qty 1

## 2017-07-16 MED ORDER — SODIUM CHLORIDE 0.9% FLUSH
3.0000 mL | Freq: Two times a day (BID) | INTRAVENOUS | Status: DC
  Administered 2017-07-16 – 2017-07-22 (×12): 3 mL via INTRAVENOUS

## 2017-07-16 MED ORDER — POLYETHYLENE GLYCOL 3350 17 G PO PACK: 17.0000 g | PACK | Freq: Every day | ORAL | Status: DC | PRN

## 2017-07-16 MED ORDER — ALBUTEROL SULFATE (2.5 MG/3ML) 0.083% IN NEBU
2.5000 mg | INHALATION_SOLUTION | RESPIRATORY_TRACT | Status: DC | PRN
  Administered 2017-07-16: 2.5 mg via RESPIRATORY_TRACT
  Filled 2017-07-16 (×2): qty 3

## 2017-07-16 MED ORDER — INSULIN ASPART 100 UNIT/ML ~~LOC~~ SOLN
0.0000 [IU] | Freq: Every day | SUBCUTANEOUS | Status: DC
  Administered 2017-07-17 – 2017-07-20 (×4): 2 [IU] via SUBCUTANEOUS
  Administered 2017-07-21: 4 [IU] via SUBCUTANEOUS
  Filled 2017-07-16 (×5): qty 1

## 2017-07-16 MED ORDER — ASPIRIN EC 81 MG PO TBEC
81.0000 mg | DELAYED_RELEASE_TABLET | Freq: Every day | ORAL | Status: DC
  Administered 2017-07-17 – 2017-07-22 (×6): 81 mg via ORAL
  Filled 2017-07-16 (×6): qty 1

## 2017-07-16 MED ORDER — ACETAMINOPHEN 650 MG RE SUPP: 650.0000 mg | Freq: Four times a day (QID) | RECTAL | Status: DC | PRN

## 2017-07-16 MED ORDER — OSELTAMIVIR PHOSPHATE 30 MG PO CAPS
30.0000 mg | ORAL_CAPSULE | Freq: Once | ORAL | Status: AC
  Administered 2017-07-16: 30 mg via ORAL
  Filled 2017-07-16: qty 1

## 2017-07-16 MED ORDER — HYDRALAZINE HCL 50 MG PO TABS
50.0000 mg | ORAL_TABLET | Freq: Two times a day (BID) | ORAL | Status: DC
  Administered 2017-07-16 – 2017-07-22 (×13): 50 mg via ORAL
  Filled 2017-07-16 (×13): qty 1

## 2017-07-16 MED ORDER — METHYLPREDNISOLONE SODIUM SUCC 125 MG IJ SOLR
60.0000 mg | INTRAMUSCULAR | Status: DC
  Administered 2017-07-16 – 2017-07-22 (×7): 60 mg via INTRAVENOUS
  Filled 2017-07-16 (×7): qty 2

## 2017-07-16 MED ORDER — SIMETHICONE 250 MG PO CAPS: 1.0000 | ORAL_CAPSULE | ORAL | Status: DC

## 2017-07-16 MED ORDER — WARFARIN SODIUM 4 MG PO TABS
4.0000 mg | ORAL_TABLET | Freq: Once | ORAL | Status: DC
  Filled 2017-07-16: qty 1

## 2017-07-16 MED ORDER — PSEUDOEPH-BROMPHEN-DM 30-2-10 MG/5ML PO SYRP: 5.0000 mL | ORAL_SOLUTION | ORAL | Status: DC | PRN

## 2017-07-16 MED ORDER — GABAPENTIN 100 MG PO CAPS
100.0000 mg | ORAL_CAPSULE | Freq: Three times a day (TID) | ORAL | Status: DC
  Administered 2017-07-16 – 2017-07-22 (×16): 100 mg via ORAL
  Filled 2017-07-16 (×16): qty 1

## 2017-07-16 NOTE — Progress Notes (Signed)
*  PRELIMINARY RESULTS* Echocardiogram 2D Echocardiogram has been performed.  Terry Tyler 07/16/2017, 2:23 PM

## 2017-07-16 NOTE — Progress Notes (Signed)
ANTICOAGULATION CONSULT NOTE - Initial Consult  Pharmacy Consult for Warfarin Indication: atrial fibrillation  Allergies  Allergen Reactions  . Lisinopril Swelling  . Atorvastatin Calcium Other (See Comments)    Muscle cramps   . Ezetimibe Other (See Comments)    Cannot remember what the allergy was.  . Lipitor [Atorvastatin] Other (See Comments)    Muscle cramping    Patient Measurements:   Heparin Dosing Weight:    Vital Signs: Temp: 98.6 F (37 C) (02/05 1233) Temp Source: Oral (02/05 0852) BP: 135/82 (02/05 1233) Pulse Rate: 113 (02/05 1233)  Labs: Recent Labs    07/16/17 0900  HGB 10.7*  HCT 32.3*  PLT 88*  CREATININE 4.69*  CKTOTAL 130  TROPONINI 0.42*    Estimated Creatinine Clearance: 17.5 mL/min (A) (by C-G formula based on SCr of 4.69 mg/dL (H)).   Medical History: Past Medical History:  Diagnosis Date  . C. difficile diarrhea   . CKD (chronic kidney disease)   . Coronary atherosclerosis of unspecified type of vessel, native or graft    Myocardial infarction in 2006. Cardiac catheterization showed significant three-vessel coronary artery disease. He underwent CABG at [facility]. Most recent cardiac catheterization in 2010 showed normal LV systolic function, patent grafts to OM1, RCA and LAD. Occluded SVG to diagonal.  . 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 Dialysis patient [facility])    Mon. Wed. Fri. for dialysis  . Hyperlipidemia   . Hypertension   . Hypertension, renal disease   . Impotence of organic origin   . Keloid scar   . Myocardial infarction ([place]) 2006  . Neoplasm of uncertain behavior, site unspecified   . Peripheral vascular disease, unspecified ([place])   . Proteinuria   . Stroke [facility]) 04/2016   TIA  . Unspecified vitamin D deficiency     Medications:  Scheduled:  . [START ON 07/17/2017] aspirin EC  81 mg Oral Daily  . gabapentin  100 mg Oral TID  . hydrALAZINE  50 mg Oral BID  . insulin aspart  0-15 Units  Subcutaneous TID WC  . insulin aspart  0-5 Units Subcutaneous QHS  . methylPREDNISolone (SOLU-MEDROL) injection  60 mg Intravenous Q24H  . metoprolol tartrate  25 mg Oral Daily  . oseltamivir  30 mg Oral Once  . [START ON 07/17/2017] oseltamivir  30 mg Oral Q M,W,F-HD  . pantoprazole  40 mg Oral BID AC  . rosuvastatin  20 mg Oral QHS  . sodium chloride flush  3 mL Intravenous Q12H  . warfarin  4 mg Oral Daily  . Warfarin - Pharmacist Dosing Inpatient   Does not apply q1800   Infusions:    Assessment: 67 yo M on Warfarin PTA. Hemodialysis patient. Influenza A positive. Hx chronic mild thrombocytopenia. Home warfarin dose per Med Rec:  4 mg daily.  2/5  INR  pending   Goal of Therapy:  INR 2-3 Monitor platelets by anticoagulation protocol: Yes   Plan:  Warfarin dose to be determined when INR result available. INR daily  Bryer Gottsch A 07/16/2017,1:06 PM

## 2017-07-16 NOTE — ED Notes (Signed)
Floor not able to take report at this time.

## 2017-07-16 NOTE — Progress Notes (Signed)
[facility], [place] 07/16/17  Subjective:   Patient known to our practice from previous admission He has end-stage renal disease and gets dialysis at [facility] He is followed by [facility] nephrology Today, he presents for generalized weakness and shortness of breath He tested positive for influenza A. His daughter reports that yesterday after dialysis, patient was profoundly weak.  He was called back for additional dialysis today for shortness of breath thinking that it was related to fluid but he was so weak that he decided to come to the emergency room His family reports that patient has really poor appetite.  He tried few bites of food but vomited this morning  Objective:  Vital signs in last 24 hours:  Temp:  [98.6 F (37 C)-99.4 F (37.4 C)] 99.4 F (37.4 C) (02/05 1520) Pulse Rate:  [105-113] 109 (02/05 1520) Resp:  [16-26] 24 (02/05 1520) BP: (135-142)/(82-97) 142/86 (02/05 1520) SpO2:  [87 %-100 %] 100 % (02/05 1529)  Weight change:  There were no vitals filed for this visit.  Intake/Output:   No intake or output data in the 24 hours ending 07/16/17 1556   Physical Exam: General:  Ill-appearing, laying in the bed  HEENT  oxygen by nasal cannula, moist oral mucous membranes  Neck  supple  Pulm/lungs  bilateral basilar crackles  CVS/Heart  no rub or gallop  Abdomen:   Soft, distended, nontender  Extremities:  1-2+ pitting edema bilaterally   Neurologic: lethargic but arousable, able to answer questions  Skin:  No acute rashes  Access:  Left forearm AV fistula       Basic Metabolic Panel:  Recent Labs  Lab 07/16/17 0900  NA 134*  K 4.0  CL 90*  CO2 29  GLUCOSE 183*  BUN 20  CREATININE 4.69*  CALCIUM 9.2     CBC: Recent Labs  Lab 07/16/17 0900  WBC 8.4  HGB 10.7*  HCT 32.3*  MCV 81.3  PLT 88*      Lab Results  Component Value Date   HEPBSAG Negative 02/20/2017      Microbiology:  No results found  for this or any previous visit (from the past 240 hour(s)).  Coagulation Studies: Recent Labs    07/16/17 1304  LABPROT 15.6*  INR 1.25    Urinalysis: No results for input(s): COLORURINE, LABSPEC, PHURINE, GLUCOSEU, HGBUR, BILIRUBINUR, KETONESUR, PROTEINUR, UROBILINOGEN, NITRITE, LEUKOCYTESUR in the last 72 hours.  Invalid input(s): APPERANCEUR    Imaging: Dg Chest Portable 1 View  Result Date: 07/16/2017 CLINICAL DATA:  Shortness of breath. Decreased lower extremity edema. EXAM: PORTABLE CHEST 1 VIEW COMPARISON:  Chest x-ray dated July 10, 2017. FINDINGS: Stable cardiomegaly status post CABG. Mild vascular congestion. Unchanged scarring/atelectasis of the left lung base. No focal consolidation, pleural effusion, or pneumothorax. No acute osseous abnormality. IMPRESSION: Stable cardiomegaly and mild vascular congestion. Electronically Signed   By: Titus Dubin M.D.   On: 07/16/2017 09:58     Medications:    . [START ON 07/17/2017] aspirin EC  81 mg Oral Daily  . gabapentin  100 mg Oral TID  . hydrALAZINE  50 mg Oral BID  . insulin aspart  0-15 Units Subcutaneous TID WC  . insulin aspart  0-5 Units Subcutaneous QHS  . methylPREDNISolone (SOLU-MEDROL) injection  60 mg Intravenous Q24H  . metoprolol tartrate  25 mg Oral Daily  . [START ON 07/17/2017] oseltamivir  30 mg Oral Q M,W,F-HD  . pantoprazole  40 mg Oral BID AC  .  rosuvastatin  20 mg Oral QHS  . sodium chloride flush  3 mL Intravenous Q12H  . warfarin  6 mg Oral ONCE-1800  . Warfarin - Pharmacist Dosing Inpatient   Does not apply q1800   acetaminophen **OR** acetaminophen, albuterol, ondansetron **OR** ondansetron (ZOFRAN) IV, polyethylene glycol, simethicone  Assessment/ Plan:  67 y.o.  African-American male with end-stage renal disease, hemodialysis, hypertension, CABG, TIA, CVA, diabetes insulin-dependent, peripheral vascular disease  MWF [facility] Left AVF  1.  End-stage renal disease 2.   Hypertension 3.  Anemia of chronic kidney disease 4.  Secondary hyperparathyroidism 5.  Respiratory distress, influenza A 6.  Lower extremity edema  Patient is admitted with influenza A.  He is profoundly weak and has some cough and shortness of breath.  His respiratory illness is likely a combination of some fluid and influenza A.  He is requiring oxygen by nasal cannula  Electrolytes and volume status are acceptable.  No acute indication for dialysis today We will plan for hemodialysis tomorrow Remove fluid as tolerated May restart Lasix 80 BID when able to take normal PO Monitor Phos during hospitalization    LOS: 0 Terry Tyler Candiss Norse 2/5/20193:56 PM  [place], [place]

## 2017-07-16 NOTE — Consult Note (Signed)
PHARMACY NOTE -  ANTIBIOTIC RENAL DOSE ADJUSTMENT   Request received for Pharmacy to assist with antibiotic renal dose adjustment.   Patient has been initiated on Tamiflu (Oseltamivir)  30mg  BID  for 5 days.  Patient has ESRD and gets HD M, W, F  Dose has been changed to Oseltamivir 30mg  today, followed by Oseltamivir 30mg  after HD on HD days for 5 days.   Will sign off at this time.   Pernell Dupre, PharmD, BCPS Clinical Pharmacist 07/16/2017 12:23 PM

## 2017-07-16 NOTE — H&P (Addendum)
[facility] [place] NAME: Terry Tyler    MR#:  [id number]  DATE OF BIRTH:  1950-07-01  DATE OF ADMISSION:  07/16/2017  PRIMARY CARE PHYSICIAN: [facility] [place]   REQUESTING/REFERRING PHYSICIAN: Dr. Corky Downs  CHIEF COMPLAINT:   Chief Complaint  Patient presents with  . Weakness  . Shortness of Breath    HISTORY OF PRESENT ILLNESS:  Terry Tyler  is a 67 y.o. male with a known history of diabetes mellitus, hypertension, incisional disease, CAD, chronic systolic CHF with ejection fraction 35% presents to the emergency room due to generalized weakness.  Patient had his routine hemodialysis yesterday and was thought to be more fluid overloaded and with generalized weakness was asked to go to the emergency room.  Patient chose to go home.  Today he had worsening weakness, shortness of breath, cough and wheezing and presented to the emergency room.  Here patient's chest x-ray shows vascular congestion..  Influenza A is positive.  Troponin found to be 0.48.  No chest pain.  The patient underwent drug-eluting stent to the saphenous vein graft to the PDA in April  2018.  PAST MEDICAL HISTORY:   Past Medical History:  Diagnosis Date  . C. difficile diarrhea   . CKD (chronic kidney disease)   . Coronary atherosclerosis of unspecified type of vessel, native or graft    Myocardial infarction in 2006. Cardiac catheterization showed significant three-vessel coronary artery disease. He underwent CABG at [facility]. Most recent cardiac catheterization in 2010 showed normal LV systolic function, patent grafts to OM1, RCA and LAD. Occluded SVG to diagonal.  . 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 Dialysis patient [facility])    Mon. Wed. Fri. for dialysis  . Hyperlipidemia   . Hypertension   . Hypertension, renal disease   . Impotence of organic origin   . Keloid scar   . Myocardial infarction ([place]) 2006  . Neoplasm of  uncertain behavior, site unspecified   . Peripheral vascular disease, unspecified ([place])   . Proteinuria   . Stroke [facility]) 04/2016   TIA  . Unspecified vitamin D deficiency     PAST SURGICAL HISTORY:   Past Surgical History:  Procedure Laterality Date  . A/V FISTULAGRAM Left 01/03/2017   Procedure: A/V Fistulagram;  Surgeon: Algernon Huxley, MD;  Location: [place] [facility];  Service: Cardiovascular;  Laterality: Left;  . A/V FISTULAGRAM Left 06/06/2017   Procedure: A/V FISTULAGRAM;  Surgeon: Algernon Huxley, MD;  Location: [place] [facility];  Service: Cardiovascular;  Laterality: Left;  . A/V SHUNT INTERVENTION N/A 01/03/2017   Procedure: A/V Shunt Intervention;  Surgeon: Algernon Huxley, MD;  Location: [place] [facility];  Service: Cardiovascular;  Laterality: N/A;  . A/V SHUNT INTERVENTION N/A 06/06/2017   Procedure: A/V SHUNT INTERVENTION;  Surgeon: Algernon Huxley, MD;  Location: [place] [facility];  Service: Cardiovascular;  Laterality: N/A;  . AV FISTULA PLACEMENT Left 10/25/2016   Procedure: ARTERIOVENOUS (AV) FISTULA CREATION ( RADIOCEPHALIC );  Surgeon: Algernon Huxley, MD;  Location: [facility] ORS;  Service: Vascular;  Laterality: Left;  . CARDIAC CATHETERIZATION  12/2004   [facility]  . CARDIAC CATHETERIZATION  09/2008   [facility]  . COLONOSCOPY WITH PROPOFOL N/A 05/09/2017   Procedure: COLONOSCOPY WITH PROPOFOL;  Surgeon: Toledo, Benay Pike, MD;  Location: [facility] ENDOSCOPY;  Service: Gastroenterology;  Laterality: N/A;  . CORONARY ANGIOPLASTY    . CORONARY ARTERY BYPASS  GRAFT  2006   [facility]  . CORONARY STENT INTERVENTION N/A 09/10/2016   Procedure: Coronary Stent Intervention;  Surgeon: Isaias Cowman, MD;  Location: [place] [facility];  Service: Cardiovascular;  Laterality: N/A;  . DIALYSIS/PERMA CATHETER REMOVAL N/A 02/14/2017   Procedure: DIALYSIS/PERMA CATHETER REMOVAL;  Surgeon: Algernon Huxley, MD;  Location: [place] [facility];  Service: Cardiovascular;  Laterality: N/A;  .  ESOPHAGOGASTRODUODENOSCOPY (EGD) WITH PROPOFOL N/A 05/08/2017   Procedure: ESOPHAGOGASTRODUODENOSCOPY (EGD) WITH PROPOFOL;  Surgeon: Toledo, Benay Pike, MD;  Location: [facility] ENDOSCOPY;  Service: Gastroenterology;  Laterality: N/A;  . keloid removal     right neck  . LEFT HEART CATH AND CORONARY ANGIOGRAPHY N/A 09/10/2016   Procedure: Left Heart Cath and Coronary Angiography;  Surgeon: Isaias Cowman, MD;  Location: [place] [facility];  Service: Cardiovascular;  Laterality: N/A;    SOCIAL HISTORY:   Social History   Tobacco Use  . Smoking status: Former Smoker    Packs/day: 0.50    Years: 0.00    Pack years: 0.00    Types: Cigarettes    Last attempt to quit: 06/11/1980    Years since quitting: 37.1  . Smokeless tobacco: Never Used  Substance Use Topics  . Alcohol use: No    Comment: socially    FAMILY HISTORY:   Family History  Problem Relation Age of Onset  . Hypertension Mother   . Hyperlipidemia Mother   . Heart disease Mother   . Heart disease Father   . Hyperlipidemia Father   . Hypertension Father   . Kidney disease Sister   . Diabetes Sister   . Hypertension Sister   . Diabetes Brother   . Hypertension Brother   . HIV/AIDS Brother     DRUG ALLERGIES:   Allergies  Allergen Reactions  . Lisinopril Swelling  . Atorvastatin Calcium Other (See Comments)    Muscle cramps   . Ezetimibe Other (See Comments)    Cannot remember what the allergy was.  . Lipitor [Atorvastatin] Other (See Comments)    Muscle cramping    REVIEW OF SYSTEMS:   Review of Systems  Constitutional: Positive for malaise/fatigue. Negative for chills and fever.  HENT: Negative for sore throat.   Eyes: Negative for blurred vision, double vision and pain.  Respiratory: Positive for cough, shortness of breath and wheezing. Negative for hemoptysis.   Cardiovascular: Positive for leg swelling. Negative for chest pain, palpitations and orthopnea.  Gastrointestinal: Negative for abdominal  pain, constipation, diarrhea, heartburn, nausea and vomiting.  Genitourinary: Negative for dysuria and hematuria.  Musculoskeletal: Negative for back pain and joint pain.  Skin: Negative for rash.  Neurological: Positive for weakness. Negative for sensory change, speech change, focal weakness and headaches.  Endo/Heme/Allergies: Does not bruise/bleed easily.  Psychiatric/Behavioral: Negative for depression. The patient is not nervous/anxious.     MEDICATIONS AT HOME:   Prior to Admission medications   Medication Sig Start Date End Date Taking? Authorizing Provider  brompheniramine-pseudoephedrine-DM 30-2-10 MG/5ML syrup Take 5 mLs by mouth 4 (four) times daily as needed. 07/10/17  Yes Letitia Neri L, PA-C  gabapentin (NEURONTIN) 300 MG capsule Take 300 mg by mouth 3 (three) times daily.   Yes [provider]  hydrALAZINE (APRESOLINE) 50 MG tablet Take 50 mg by mouth 2 (two) times daily.    Yes [provider]  metoprolol tartrate (LOPRESSOR) 25 MG tablet Take 25 mg by mouth daily.   Yes [provider]  pantoprazole (PROTONIX) 40 MG tablet Take 1  tablet (40 mg total) by mouth 2 (two) times daily before a meal. 05/10/17  Yes Gladstone Lighter, MD  rosuvastatin (CRESTOR) 20 MG tablet Take 20 mg by mouth at bedtime.    Yes [provider]  Simethicone 250 MG CAPS Take 1 capsule by mouth 1 day or 1 dose for 1 dose. 07/10/17 07/16/17 Yes Johnn Hai, PA-C  vancomycin (VANCOCIN) 50 mg/mL oral solution Take 125 mg by mouth every 6 (six) hours.   Yes [provider]  warfarin (COUMADIN) 4 MG tablet Take 4 mg by mouth daily.   Yes [provider]  vancomycin (VANCOCIN) 125 MG capsule Take 1 capsule (125 mg total) by mouth daily. To start after finishing the twice a day prescription Patient not taking: Reported on 07/16/2017 06/16/17   Nance Pear, MD     VITAL SIGNS:  Blood pressure 135/82, pulse (!) 113, temperature 98.6 F (37 C),  resp. rate 17, SpO2 91 %.  PHYSICAL EXAMINATION:  Physical Exam  GENERAL:  67 y.o.-year-old patient lying in the bed.  Obese.  Mild respiratory distress. EYES: Pupils equal, round, reactive to light and accommodation. No scleral icterus. Extraocular muscles intact.  HEENT: Head atraumatic, normocephalic. Oropharynx and nasopharynx clear. No oropharyngeal erythema, moist oral mucosa  NECK:  Supple, no jugular venous distention. No thyroid enlargement, no tenderness.  LUNGS: Bilateral wheezing and crackles with decreased air entry. CARDIOVASCULAR: S1, S2 normal. No murmurs, rubs, or gallops.  ABDOMEN: Soft, nontender, nondistended. Bowel sounds present. No organomegaly or mass.  EXTREMITIES: Right greater than left edema which is chronic NEUROLOGIC: Cranial nerves II through XII are intact.  Motor strength 4/5 in lower extremities. PSYCHIATRIC: The patient is alert and oriented x 3. Good affect.   LABORATORY PANEL:   CBC Recent Labs  Lab 07/16/17 0900  WBC 8.4  HGB 10.7*  HCT 32.3*  PLT 88*   ------------------------------------------------------------------------------------------------------------------  Chemistries  Recent Labs  Lab 07/16/17 0900  NA 134*  K 4.0  CL 90*  CO2 29  GLUCOSE 183*  BUN 20  CREATININE 4.69*  CALCIUM 9.2  AST 84*  ALT 33  ALKPHOS 477*  BILITOT 5.9*   ------------------------------------------------------------------------------------------------------------------  Cardiac Enzymes Recent Labs  Lab 07/16/17 0900  TROPONINI 0.42*   ------------------------------------------------------------------------------------------------------------------  RADIOLOGY:  Dg Chest Portable 1 View  Result Date: 07/16/2017 CLINICAL DATA:  Shortness of breath. Decreased lower extremity edema. EXAM: PORTABLE CHEST 1 VIEW COMPARISON:  Chest x-ray dated July 10, 2017. FINDINGS: Stable cardiomegaly status post CABG. Mild vascular congestion. Unchanged  scarring/atelectasis of the left lung base. No focal consolidation, pleural effusion, or pneumothorax. No acute osseous abnormality. IMPRESSION: Stable cardiomegaly and mild vascular congestion. Electronically Signed   By: Titus Dubin M.D.   On: 07/16/2017 09:58     IMPRESSION AND PLAN:   * Acute on chronic systolic congestive heart failure.  Patient has end-stage renal disease.  Discussed with nephrology regarding his hemodialysis.  Patient schedule is Monday Wednesday and Friday.  Will need extra dialysis today. Continue home medications.  *Influenza a.  Will start Tamiflu renally dosed.  Nebulizers as needed.  Will also start IV steroids as patient has wheezing.  *Elevated troponin likely due to demand ischemia.  Patient is on Coumadin.  Discussed with Dr. Towanda Malkin.  Repeat troponin.  No heparin at this time. continue aspirin.  *Generalized weakness due to CHF, influenza.  Patient should improve with dialysis and Tamiflu.  Will also consult physical therapy for ambulation and assessment.  *Chronic mild thrombocytopenia  without any bleeding  *DVT prophylaxis.  On Coumadin.  All the records are reviewed and case discussed with ED provider. Management plans discussed with the patient, family and they are in agreement.  CODE STATUS: FULL CODE  TOTAL TIME TAKING CARE OF THIS PATIENT: 40 minutes.   Leia Alf Tomesha Sargent M.D on 07/16/2017 at 12:40 PM  Between 7am to 6pm - Pager - [phone]  After 6pm go to [email or web address] - password [email or web address] [place]  Office  [phone]  CC: Primary care physician; [facility], [facility]  Note: This dictation was prepared with Dragon dictation along with smaller phrase technology. Any transcriptional errors that result from this process are unintentional.

## 2017-07-16 NOTE — ED Triage Notes (Signed)
Pt to ed via ems from home with c/o increased swelling in feet ankles, and abd.  Pt states he had dialysis yesterday and after was told that he should come to hospital today because of increased fluid. Pt reports mild sob, generalized weakness.

## 2017-07-16 NOTE — Care Management (Signed)
patient presented with weakness and shortness of breath.  He has elevated troponin and positive for A flu.  Chronic HD M W F . Elvera Bicker with [facility] aware of admission.  Current need for oxygen is acute

## 2017-07-16 NOTE — Progress Notes (Signed)
Advanced Directive education provided to [place] and three daughters. Prayer offered.     07/16/17 1800  Clinical Encounter Type  Visited With Patient and family together  Visit Type Spiritual support  Spiritual Encounters  Spiritual Needs Other (Comment) (AD education)

## 2017-07-16 NOTE — ED Notes (Signed)
Family at bedside. 

## 2017-07-16 NOTE — Progress Notes (Signed)
PT Cancellation Note  Patient Details Name: Terry Tyler MRN: [id number] DOB: Jun 05, 1951   Cancelled Treatment:    Reason Eval/Treat Not Completed: Patient not medically ready.  Pt presenting to ED this morning; noted with elevated troponin of 0.42.  Cardiology consulted.  Will hold PT at this time d/t elevated troponin (repeat troponin still pending) and re-attempt PT eval at a later date/time as medically appropriate.  Leitha Bleak, PT 07/16/17, 2:54 PM [phone]

## 2017-07-16 NOTE — Progress Notes (Addendum)
ANTICOAGULATION CONSULT NOTE - Initial Consult  Pharmacy Consult for Warfarin Indication: atrial fibrillation  Allergies  Allergen Reactions  . Lisinopril Swelling  . Atorvastatin Calcium Other (See Comments)    Muscle cramps   . Ezetimibe Other (See Comments)    Cannot remember what the allergy was.  . Lipitor [Atorvastatin] Other (See Comments)    Muscle cramping    Patient Measurements:   Heparin Dosing Weight:    Vital Signs: Temp: 98.6 F (37 C) (02/05 1233) Temp Source: Oral (02/05 0852) BP: 135/82 (02/05 1233) Pulse Rate: 113 (02/05 1233)  Labs: Recent Labs    07/16/17 0900 07/16/17 1304  HGB 10.7*  --   HCT 32.3*  --   PLT 88*  --   LABPROT  --  15.6*  INR  --  1.25  CREATININE 4.69*  --   CKTOTAL 130  --   TROPONINI 0.42*  --     Estimated Creatinine Clearance: 17.5 mL/min (A) (by C-G formula based on SCr of 4.69 mg/dL (H)).   Medical History: Past Medical History:  Diagnosis Date  . C. difficile diarrhea   . CKD (chronic kidney disease)   . Coronary atherosclerosis of unspecified type of vessel, native or graft    Myocardial infarction in 2006. Cardiac catheterization showed significant three-vessel coronary artery disease. He underwent CABG at [facility]. Most recent cardiac catheterization in 2010 showed normal LV systolic function, patent grafts to OM1, RCA and LAD. Occluded SVG to diagonal.  . 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 Dialysis patient [facility])    Mon. Wed. Fri. for dialysis  . Hyperlipidemia   . Hypertension   . Hypertension, renal disease   . Impotence of organic origin   . Keloid scar   . Myocardial infarction ([place]) 2006  . Neoplasm of uncertain behavior, site unspecified   . Peripheral vascular disease, unspecified ([place])   . Proteinuria   . Stroke [facility]) 04/2016   TIA  . Unspecified vitamin D deficiency     Medications:  Scheduled:  . [START ON 07/17/2017] aspirin EC  81 mg Oral Daily  . gabapentin   100 mg Oral TID  . hydrALAZINE  50 mg Oral BID  . insulin aspart  0-15 Units Subcutaneous TID WC  . insulin aspart  0-5 Units Subcutaneous QHS  . methylPREDNISolone (SOLU-MEDROL) injection  60 mg Intravenous Q24H  . metoprolol tartrate  25 mg Oral Daily  . [START ON 07/17/2017] oseltamivir  30 mg Oral Q M,W,F-HD  . pantoprazole  40 mg Oral BID AC  . rosuvastatin  20 mg Oral QHS  . sodium chloride flush  3 mL Intravenous Q12H  . warfarin  4 mg Oral ONCE-1800  . Warfarin - Pharmacist Dosing Inpatient   Does not apply q1800   Infusions:    Assessment: 67 yo M on Warfarin PTA. Hemodialysis patient. Influenza A positive. Hx chronic mild thrombocytopenia. Home warfarin dose per Med Rec:  4 mg daily.  DATE INR DOSE 2/5 1.25 6mg    Goal of Therapy:  INR 2-3 Monitor platelets by anticoagulation protocol: Yes   Plan:  INR subtherapeutic on admission. Patient has been started on Tamiflu (Oseltamivir). Concurrent use of OSELTAMIVIR and WARFARIN may result in increased risk of bleeding.  Will give warfarin 6mg  x 1 dose today.   INR daily while on Tamiflu and until therapeutic.   Pernell Dupre, PharmD, BCPS Clinical Pharmacist 07/16/2017 2:59 PM

## 2017-07-16 NOTE — ED Provider Notes (Signed)
[facility] Emergency Department Provider Note   ____________________________________________    I have reviewed the triage vital signs and the nursing notes.   HISTORY  Chief Complaint Weakness and Shortness of Breath     HPI Terry Tyler is a 67 y.o. male who presents with complaints of diffuse weakness and mild shortness of breath.  Apparently after dialysis yesterday patient was complaining of severe weakness and his nurse there told him to come to the emergency department yesterday but he tried to go home and rest.  He continues to feel diffusely weak today.  No focal deficits.  Subjective fevers.  No nausea no chest pain.  Mild shortness of breath.  Apparently had more fluid taken off yesterday than typical but they stated that he would likely need more fluid taken off as well   Past Medical History:  Diagnosis Date  . C. difficile diarrhea   . CKD (chronic kidney disease)   . Coronary atherosclerosis of unspecified type of vessel, native or graft    Myocardial infarction in 2006. Cardiac catheterization showed significant three-vessel coronary artery disease. He underwent CABG at [facility] Most recent cardiac catheterization in 2010 showed normal LV systolic function, patent grafts to OM1, RCA and LAD. Occluded SVG to diagonal.  . 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 Dialysis patient [facility])    Mon. Wed. Fri. for dialysis  . Hyperlipidemia   . Hypertension   . Hypertension, renal disease   . Impotence of organic origin   . Keloid scar   . Myocardial infarction ([place]) 2006  . Neoplasm of uncertain behavior, site unspecified   . Peripheral vascular disease, unspecified ([place])   . Proteinuria   . Stroke [facility]) 04/2016   TIA  . Unspecified vitamin D deficiency     Patient Active Problem List   Diagnosis Date Noted  . GI bleed 05/07/2017  . 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 Hyperbilirubinemia  04/10/2017  . Diarrhea 02/20/2017  . C. difficile diarrhea 02/19/2017  . Diarrhea of presumed infectious origin 11/01/2016  . ESRD on dialysis ([place]) 10/16/2016  . Fatigue 10/02/2016  . Leukocytosis 10/02/2016  . Nausea & vomiting 10/02/2016  . Inferior myocardial infarction ([place]) 09/18/2016  . Elevated troponin 09/08/2016  . CVA (cerebral vascular accident) ([place]) 04/09/2016  . Hyperphosphatemia 08/09/2015  . Secondary hyperparathyroidism ([place]) 08/09/2015  . Chronic kidney disease (CKD) stage G4/A1, severely decreased glomerular filtration rate (GFR) between 15-29 mL/min/1.73 square meter and albuminuria creatinine ratio less than 30 mg/g (HCC) 05/31/2015  . Bilateral carotid artery stenosis 07/24/2014  . Hypertensive heart disease without heart failure 07/24/2014  . Nonrheumatic mitral valve insufficiency 07/24/2014  . Stenosis of carotid artery 07/24/2014  . Anemia 03/30/2013  . Vitamin D deficiency 03/30/2013  . Obesity 08/28/2012  . PAD (peripheral artery disease) ([place]) 04/10/2012  . Coronary atherosclerosis of unspecified type of vessel, native or graft   . Hyperlipidemia   . Hypertension   . Proteinuria 06/19/2010  . Type II diabetes mellitus ([place]) 06/19/2010    Past Surgical History:  Procedure Laterality Date  . A/V FISTULAGRAM Left 01/03/2017   Procedure: A/V Fistulagram;  Surgeon: Algernon Huxley, MD;  Location: [place] [facility];  Service: Cardiovascular;  Laterality: Left;  . A/V FISTULAGRAM Left 06/06/2017   Procedure: A/V FISTULAGRAM;  Surgeon: Algernon Huxley, MD;  Location: [place] [facility];  Service: Cardiovascular;  Laterality: Left;  . A/V SHUNT INTERVENTION  N/A 01/03/2017   Procedure: A/V Shunt Intervention;  Surgeon: Algernon Huxley, MD;  Location: [place] [facility];  Service: Cardiovascular;  Laterality: N/A;  . A/V SHUNT INTERVENTION N/A 06/06/2017   Procedure: A/V SHUNT INTERVENTION;  Surgeon: Algernon Huxley, MD;  Location: [place] [facility];  Service:  Cardiovascular;  Laterality: N/A;  . AV FISTULA PLACEMENT Left 10/25/2016   Procedure: ARTERIOVENOUS (AV) FISTULA CREATION ( RADIOCEPHALIC );  Surgeon: Algernon Huxley, MD;  Location: [facility];  Service: Vascular;  Laterality: Left;  . CARDIAC CATHETERIZATION  12/2004   [facility]  . CARDIAC CATHETERIZATION  09/2008   [facility];[place]  . COLONOSCOPY WITH PROPOFOL N/A 05/09/2017   Procedure: COLONOSCOPY WITH PROPOFOL;  Surgeon: Toledo, Benay Pike, MD;  Location: [facility] ENDOSCOPY;  Service: Gastroenterology;  Laterality: N/A;  . CORONARY ANGIOPLASTY    . CORONARY ARTERY BYPASS GRAFT  2006   [facility]  . CORONARY STENT INTERVENTION N/A 09/10/2016   Procedure: Coronary Stent Intervention;  Surgeon: Isaias Cowman, MD;  Location: [place] [facility];  Service: Cardiovascular;  Laterality: N/A;  . DIALYSIS/PERMA CATHETER REMOVAL N/A 02/14/2017   Procedure: DIALYSIS/PERMA CATHETER REMOVAL;  Surgeon: Algernon Huxley, MD;  Location: [place] [facility];  Service: Cardiovascular;  Laterality: N/A;  . ESOPHAGOGASTRODUODENOSCOPY (EGD) WITH PROPOFOL N/A 05/08/2017   Procedure: ESOPHAGOGASTRODUODENOSCOPY (EGD) WITH PROPOFOL;  Surgeon: Toledo, Benay Pike, MD;  Location: [facility] ENDOSCOPY;  Service: Gastroenterology;  Laterality: N/A;  . keloid removal     right neck  . LEFT HEART CATH AND CORONARY ANGIOGRAPHY N/A 09/10/2016   Procedure: Left Heart Cath and Coronary Angiography;  Surgeon: Isaias Cowman, MD;  Location: [place] [facility];  Service: Cardiovascular;  Laterality: N/A;    Prior to Admission medications   Medication Sig Start Date End Date Taking? Authorizing Provider  brompheniramine-pseudoephedrine-DM 30-2-10 MG/5ML syrup Take 5 mLs by mouth 4 (four) times daily as needed. 07/10/17  Yes Letitia Neri L, PA-C  gabapentin (NEURONTIN) 300 MG capsule Take 300 mg by mouth 3 (three) times daily.   Yes [provider]  hydrALAZINE (APRESOLINE) 50 MG tablet Take 50 mg by mouth 2 (two) times daily.     Yes [provider]  metoprolol tartrate (LOPRESSOR) 25 MG tablet Take 25 mg by mouth daily.   Yes [provider]  pantoprazole (PROTONIX) 40 MG tablet Take 1 tablet (40 mg total) by mouth 2 (two) times daily before a meal. 05/10/17  Yes Gladstone Lighter, MD  rosuvastatin (CRESTOR) 20 MG tablet Take 20 mg by mouth at bedtime.    Yes [provider]  Simethicone 250 MG CAPS Take 1 capsule by mouth 1 day or 1 dose for 1 dose. 07/10/17 07/16/17 Yes Johnn Hai, PA-C  vancomycin (VANCOCIN) 50 mg/mL oral solution Take 125 mg by mouth every 6 (six) hours.   Yes [provider]  warfarin (COUMADIN) 4 MG tablet Take 4 mg by mouth daily.   Yes [provider]  vancomycin (VANCOCIN) 125 MG capsule Take 1 capsule (125 mg total) by mouth daily. To start after finishing the twice a day prescription Patient not taking: Reported on 07/16/2017 06/16/17   Nance Pear, MD     Allergies Lisinopril; Atorvastatin calcium; Ezetimibe; and Lipitor [atorvastatin]  Family History  Problem Relation Age of Onset  . Hypertension Mother   . Hyperlipidemia Mother   . Heart disease Mother   . Heart disease Father   . Hyperlipidemia Father   . Hypertension Father   .  Kidney disease Sister   . Diabetes Sister   . Hypertension Sister   . Diabetes Brother   . Hypertension Brother   . HIV/AIDS Brother     Social History Social History   Tobacco Use  . Smoking status: Former Smoker    Packs/day: 0.50    Years: 0.00    Pack years: 0.00    Types: Cigarettes    Last attempt to quit: 06/11/1980    Years since quitting: 37.1  . Smokeless tobacco: Never Used  Substance Use Topics  . Alcohol use: No    Comment: socially  . Drug use: No    Review of Systems  Constitutional: Subjective fevers, diffuse weakness Eyes: No visual changes.  ENT: No sore throat. Cardiovascular: Denies chest pain. no Palpitations  respiratory: Mild shortness of  breath Gastrointestinal: No abdominal pain.  No nausea, no vomiting.   Genitourinary: Negative for dysuria. Musculoskeletal: Negative for back pain. Skin: Negative for rash. Neurological: Negative for headaches or focal weakness   ____________________________________________   PHYSICAL EXAM:  VITAL SIGNS: ED Triage Vitals  Enc Vitals Group     BP 07/16/17 0851 (!) 141/97     Pulse Rate 07/16/17 0851 (!) 113     Resp 07/16/17 0851 16     Temp 07/16/17 0852 98.8 F (37.1 C)     Temp Source 07/16/17 0852 Oral     SpO2 07/16/17 0851 99 %     Weight --      Height --      Head Circumference --      Peak Flow --      Pain Score 07/16/17 0851 0     Pain Loc --      Pain Edu? --      Excl. in [place]? --     Constitutional: Alert and oriented. No acute distress.  Eyes: Conjunctivae are normal.   Nose: No congestion/rhinnorhea. Mouth/Throat: Mucous membranes are moist.    Cardiovascular: Tachycardia regular rhythm. Grossly normal heart sounds.  Good peripheral circulation. Respiratory: Normal respiratory effort.  No retractions.  Bibasilar rales Gastrointestinal: Soft and nontender.  Mild distention.  No CVA tenderness. Genitourinary: deferred Musculoskeletal: Minimal edema lower extremities. warm and well perfused Neurologic:  Normal speech and language. No gross focal neurologic deficits are appreciated.  Skin:  Skin is warm, dry and intact. No rash noted. Psychiatric: Mood and affect are normal. Speech and behavior are normal.  ____________________________________________   LABS (all labs ordered are listed, but only abnormal results are displayed)  Labs Reviewed  CBC - Abnormal; Notable for the following components:      Result Value   RBC 3.97 (*)    Hemoglobin 10.7 (*)    HCT 32.3 (*)    RDW 16.4 (*)    Platelets 88 (*)    All other components within normal limits  COMPREHENSIVE METABOLIC PANEL - Abnormal; Notable for the following components:   Sodium 134 (*)     Chloride 90 (*)    Glucose, Bld 183 (*)    Creatinine, Ser 4.69 (*)    Total Protein 8.6 (*)    Albumin 3.3 (*)    AST 84 (*)    Alkaline Phosphatase 477 (*)    Total Bilirubin 5.9 (*)    GFR calc non Af Amer 12 (*)    GFR calc Af Amer 14 (*)    All other components within normal limits  TROPONIN I - Abnormal; Notable for the following components:  Troponin I 0.42 (*)    All other components within normal limits  INFLUENZA PANEL BY PCR (TYPE A & B)  CK   ____________________________________________  EKG  ED ECG REPORT I, Lavonia Drafts, the attending physician, personally viewed and interpreted this ECG.  Date: 07/16/2017  Rhythm: Sinus tachycardia QRS Axis: normal Intervals: Prolonged QTC ST/T Wave abnormalities: normal Narrative Interpretation: no evidence of acute ischemia  ____________________________________________  RADIOLOGY  Chest x-ray unremarkable ____________________________________________   PROCEDURES  Procedure(s) performed: No  Procedures   Critical Care performed: yes  CRITICAL CARE Performed by: Lavonia Drafts   Total critical care time:30 minutes  Critical care time was exclusive of separately billable procedures and treating other patients.  Critical care was necessary to treat or prevent imminent or life-threatening deterioration.  Critical care was time spent personally by me on the following activities: development of treatment plan with patient and/or surrogate as well as nursing, discussions with consultants, evaluation of patient's response to treatment, examination of patient, obtaining history from patient or surrogate, ordering and performing treatments and interventions, ordering and review of laboratory studies, ordering and review of radiographic studies, pulse oximetry and re-evaluation of patient's condition.  ____________________________________________   INITIAL IMPRESSION / ASSESSMENT AND PLAN / ED  COURSE  Pertinent labs & imaging results that were available during my care of the patient were reviewed by me and considered in my medical decision making (see chart for details).  Patient presents with extensive past medical history with complaints of diffuse weakness.   No chest pain.  Mild Shortness of breath.  Subjective fevers.  We will check labs, including influenza.  Potentially still fluid overloaded mildly however not significant with her breathing   Notified of significantly elevated troponin 0.48.  Patient does have a history of an nSTEMI in the past, troponins not typically elevated for him, no chest pain potentially troponin is related to mild shortness of breath and tachycardia.  Will require admission to the hospitalist service for further evaluation    ____________________________________________   FINAL CLINICAL IMPRESSION(S) / ED DIAGNOSES  Final diagnoses:  Demand ischemia ([place])  Generalized weakness        Note:  This document was prepared using Dragon voice recognition software and may include unintentional dictation errors.    Lavonia Drafts, MD 07/16/17 [phone]

## 2017-07-17 LAB — GLUCOSE, CAPILLARY
GLUCOSE-CAPILLARY: 198 mg/dL — AB (ref 65–99)
GLUCOSE-CAPILLARY: 217 mg/dL — AB (ref 65–99)
Glucose-Capillary: 152 mg/dL — ABNORMAL HIGH (ref 65–99)

## 2017-07-17 LAB — PROTIME-INR
INR: 1.24
Prothrombin Time: 15.5 seconds — ABNORMAL HIGH (ref 11.4–15.2)

## 2017-07-17 LAB — HEMOGLOBIN A1C
HEMOGLOBIN A1C: 5.7 % — AB (ref 4.8–5.6)
MEAN PLASMA GLUCOSE: 116.89 mg/dL

## 2017-07-17 LAB — MRSA PCR SCREENING: MRSA by PCR: NEGATIVE

## 2017-07-17 MED ORDER — WARFARIN SODIUM 6 MG PO TABS
6.0000 mg | ORAL_TABLET | Freq: Every day | ORAL | Status: DC
  Administered 2017-07-17 – 2017-07-19 (×3): 6 mg via ORAL
  Filled 2017-07-17 (×3): qty 1

## 2017-07-17 MED ORDER — METOPROLOL SUCCINATE ER 25 MG PO TB24
25.0000 mg | ORAL_TABLET | Freq: Every day | ORAL | Status: DC
  Administered 2017-07-17 – 2017-07-22 (×6): 25 mg via ORAL
  Filled 2017-07-17 (×8): qty 1

## 2017-07-17 NOTE — Progress Notes (Signed)
PT Cancellation Note  Patient Details Name: Terry Tyler MRN: [id number] DOB: May 27, 1951   Cancelled Treatment:    Reason Eval/Treat Not Completed: Patient not medically ready.  Pt noted with up-trending troponin to 0.70.  Will hold PT at this time d/t up-trending troponin and cardiology consult still pending.  Leitha Bleak, PT 07/17/17, 8:11 AM [phone]

## 2017-07-17 NOTE — Progress Notes (Signed)
Pre HD  

## 2017-07-17 NOTE — Consult Note (Signed)
Reason for Consult: Shortness of breath weakness known coronary disease Referring Physician: [facility] primary Dr. Darvin Neighbours hospitalist  Terry Tyler is an 67 y.o. male.  HPI: Patient has a history of known diabetes hypertension coronary disease congestive heart failure cardiomyopathy ejection fraction of 85% with systolic dysfunction patient presents emergency room with generalized weakness.  Patient is on routine hemodialysis and was slightly fluid overloaded with generalized weakness and was referred to the emergency room.  She chose to go home but on the day of admission his symptoms worsen with weakness shortness of breath wheezing cough emergency room chest x-ray suggested vascular congestion and flu test was positive for influenza A patient had borderline elevated troponin in the face of renal failure.  Patient has known coronary disease with PCI and stent to the PDA April 2018 with a DES  Past Medical History:  Diagnosis Date  . C. difficile diarrhea   . CKD (chronic kidney disease)   . Coronary atherosclerosis of unspecified type of vessel, native or graft    Myocardial infarction in 2006. Cardiac catheterization showed significant three-vessel coronary artery disease. He underwent CABG at [facility]. Most recent cardiac catheterization in 2010 showed normal LV systolic function, patent grafts to OM1, RCA and LAD. Occluded SVG to diagonal.  . 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 Dialysis patient [facility])    Mon. Wed. Fri. for dialysis  . Hyperlipidemia   . Hypertension   . Hypertension, renal disease   . Impotence of organic origin   . Keloid scar   . Myocardial infarction ([place]) 2006  . Neoplasm of uncertain behavior, site unspecified   . Peripheral vascular disease, unspecified ([place])   . Proteinuria   . Stroke [facility]) 04/2016   TIA  . Unspecified vitamin D deficiency     Past Surgical History:  Procedure Laterality Date  . A/V FISTULAGRAM  Left 01/03/2017   Procedure: A/V Fistulagram;  Surgeon: Algernon Huxley, MD;  Location: [place] [facility];  Service: Cardiovascular;  Laterality: Left;  . A/V FISTULAGRAM Left 06/06/2017   Procedure: A/V FISTULAGRAM;  Surgeon: Algernon Huxley, MD;  Location: [place] [facility];  Service: Cardiovascular;  Laterality: Left;  . A/V SHUNT INTERVENTION N/A 01/03/2017   Procedure: A/V Shunt Intervention;  Surgeon: Algernon Huxley, MD;  Location: [place] [facility];  Service: Cardiovascular;  Laterality: N/A;  . A/V SHUNT INTERVENTION N/A 06/06/2017   Procedure: A/V SHUNT INTERVENTION;  Surgeon: Algernon Huxley, MD;  Location: [place] [facility];  Service: Cardiovascular;  Laterality: N/A;  . AV FISTULA PLACEMENT Left 10/25/2016   Procedure: ARTERIOVENOUS (AV) FISTULA CREATION ( RADIOCEPHALIC );  Surgeon: Algernon Huxley, MD;  Location: [facility] ORS;  Service: Vascular;  Laterality: Left;  . CARDIAC CATHETERIZATION  12/2004   [facility]  . CARDIAC CATHETERIZATION  09/2008   [facility]  . COLONOSCOPY WITH PROPOFOL N/A 05/09/2017   Procedure: COLONOSCOPY WITH PROPOFOL;  Surgeon: Toledo, Benay Pike, MD;  Location: [facility] ENDOSCOPY;  Service: Gastroenterology;  Laterality: N/A;  . CORONARY ANGIOPLASTY    . CORONARY ARTERY BYPASS GRAFT  2006   [facility]  . CORONARY STENT INTERVENTION N/A 09/10/2016   Procedure: Coronary Stent Intervention;  Surgeon: Isaias Cowman, MD;  Location: [place] [facility];  Service: Cardiovascular;  Laterality: N/A;  . DIALYSIS/PERMA CATHETER REMOVAL N/A 02/14/2017   Procedure: DIALYSIS/PERMA CATHETER REMOVAL;  Surgeon: Algernon Huxley, MD;  Location: [place] [facility];  Service: Cardiovascular;  Laterality: N/A;  . ESOPHAGOGASTRODUODENOSCOPY (EGD) WITH PROPOFOL N/A 05/08/2017   Procedure: ESOPHAGOGASTRODUODENOSCOPY (EGD) WITH PROPOFOL;  Surgeon: Toledo, Benay Pike, MD;  Location: [facility];  Service: Gastroenterology;  Laterality: N/A;  . keloid removal     right neck  . LEFT HEART  CATH AND CORONARY ANGIOGRAPHY N/A 09/10/2016   Procedure: Left Heart Cath and Coronary Angiography;  Surgeon: Isaias Cowman, MD;  Location: [place] [facility];  Service: Cardiovascular;  Laterality: N/A;    Family History  Problem Relation Age of Onset  . Hypertension Mother   . Hyperlipidemia Mother   . Heart disease Mother   . Heart disease Father   . Hyperlipidemia Father   . Hypertension Father   . Kidney disease Sister   . Diabetes Sister   . Hypertension Sister   . Diabetes Brother   . Hypertension Brother   . HIV/AIDS Brother     Social History:  reports that he quit smoking about 37 years ago. His smoking use included cigarettes. He smoked 0.50 packs per day for 0.00 years. he has never used smokeless tobacco. He reports that he does not drink alcohol or use drugs.  Allergies:  Allergies  Allergen Reactions  . Lisinopril Swelling  . Atorvastatin Calcium Other (See Comments)    Muscle cramps   . Ezetimibe Other (See Comments)    Cannot remember what the allergy was.  . Lipitor [Atorvastatin] Other (See Comments)    Muscle cramping    Medications: I have reviewed the patient's current medications.  Results for orders placed or performed during the hospital encounter of 07/16/17 (from the past 48 hour(s))  CBC     Status: Abnormal   Collection Time: 07/16/17  9:00 AM  Result Value Ref Range   WBC 8.4 3.8 - 10.6 K/uL   RBC 3.97 (L) 4.40 - 5.90 MIL/uL   Hemoglobin 10.7 (L) 13.0 - 18.0 g/dL   HCT 32.3 (L) 40.0 - 52.0 %   MCV 81.3 80.0 - 100.0 fL   MCH 27.0 26.0 - 34.0 pg   MCHC 33.2 32.0 - 36.0 g/dL   RDW 16.4 (H) 11.5 - 14.5 %   Platelets 88 (L) 150 - 440 K/uL    Comment: Performed at [facility], [address] [place], [place] [address]  Comprehensive metabolic panel     Status: Abnormal   Collection Time: 07/16/17  9:00 AM  Result Value Ref Range   Sodium 134 (L) 135 - 145 mmol/L   Potassium 4.0 3.5 - 5.1 mmol/L   Chloride 90 (L) 101 -  111 mmol/L   CO2 29 22 - 32 mmol/L   Glucose, Bld 183 (H) 65 - 99 mg/dL   BUN 20 6 - 20 mg/dL   Creatinine, Ser 4.69 (H) 0.61 - 1.24 mg/dL   Calcium 9.2 8.9 - 10.3 mg/dL   Total Protein 8.6 (H) 6.5 - 8.1 g/dL   Albumin 3.3 (L) 3.5 - 5.0 g/dL   AST 84 (H) 15 - 41 U/L   ALT 33 17 - 63 U/L   Alkaline Phosphatase 477 (H) 38 - 126 U/L   Total Bilirubin 5.9 (H) 0.3 - 1.2 mg/dL   GFR calc non Af Amer 12 (L) >60 mL/min   GFR calc Af Amer 14 (L) >60 mL/min    Comment: (NOTE) The eGFR has been calculated using the CKD EPI equation. This calculation has not been validated in all clinical situations. eGFR's persistently <60 mL/min signify possible Chronic Kidney Disease.  Anion gap 15 5 - 15    Comment: Performed at [facility], [address] [place], [place] [address]  Troponin I     Status: Abnormal   Collection Time: 07/16/17  9:00 AM  Result Value Ref Range   Troponin I 0.42 (HH) <0.03 ng/mL    Comment: CRITICAL RESULT CALLED TO, READ BACK BY AND VERIFIED WITH TINA CARR AT 2956 07/16/17 DAS Performed at [facility], [address] [place], [place] [address]   CK     Status: None   Collection Time: 07/16/17  9:00 AM  Result Value Ref Range   Total CK 130 49 - 397 U/L    Comment: Performed at [facility], [address] [place], [place] [address]  Influenza panel by PCR (type A & B)     Status: Abnormal   Collection Time: 07/16/17 10:10 AM  Result Value Ref Range   Influenza A By PCR POSITIVE (A) NEGATIVE   Influenza B By PCR NEGATIVE NEGATIVE    Comment: (NOTE) The Xpert Xpress Flu assay is intended as an aid in the diagnosis of  influenza and should not be used as a sole basis for treatment.  This  assay is FDA approved for nasopharyngeal swab specimens only. Nasal  washings and aspirates are unacceptable for Xpert Xpress Flu testing. Performed at [facility], [address] [place], [place] [address]   Glucose, capillary      Status: Abnormal   Collection Time: 07/16/17 12:58 PM  Result Value Ref Range   Glucose-Capillary 174 (H) 65 - 99 mg/dL  Protime-INR     Status: Abnormal   Collection Time: 07/16/17  1:04 PM  Result Value Ref Range   Prothrombin Time 15.6 (H) 11.4 - 15.2 seconds   INR 1.25     Comment: Performed at [facility], [address] [place], [place] [address]  Troponin I     Status: Abnormal   Collection Time: 07/16/17  4:07 PM  Result Value Ref Range   Troponin I 0.49 (HH) <0.03 ng/mL    Comment: CRITICAL VALUE NOTED. VALUE IS CONSISTENT WITH PREVIOUSLY REPORTED/CALLED VALUE.  TFK Performed at [facility], [address] [place], [place] [address]   Glucose, capillary     Status: Abnormal   Collection Time: 07/16/17  5:24 PM  Result Value Ref Range   Glucose-Capillary 173 (H) 65 - 99 mg/dL  Glucose, capillary     Status: Abnormal   Collection Time: 07/16/17  9:17 PM  Result Value Ref Range   Glucose-Capillary 186 (H) 65 - 99 mg/dL   Comment 1 Notify RN    Comment 2 Document in Chart   Troponin I     Status: Abnormal   Collection Time: 07/16/17 11:23 PM  Result Value Ref Range   Troponin I 0.70 (HH) <0.03 ng/mL    Comment: CRITICAL VALUE NOTED. VALUE IS CONSISTENT WITH PREVIOUSLY REPORTED/CALLED VALUE [facility] Performed at [facility], [address] [place], [place] [address]   Protime-INR     Status: Abnormal   Collection Time: 07/17/17  3:32 AM  Result Value Ref Range   Prothrombin Time 15.5 (H) 11.4 - 15.2 seconds   INR 1.24     Comment: Performed at [facility], [address] [place], [place] [address]  MRSA PCR Screening     Status: None   Collection Time: 07/17/17  4:28 AM  Result Value Ref Range   MRSA by PCR  NEGATIVE NEGATIVE    Comment:        The GeneXpert MRSA Assay (FDA approved for NASAL specimens only), is one component of a comprehensive MRSA colonization surveillance program. It is not intended to diagnose  MRSA infection nor to guide or monitor treatment for MRSA infections. Performed at [facility], [address] [place], [place] [address]   Glucose, capillary     Status: Abnormal   Collection Time: 07/17/17  8:30 AM  Result Value Ref Range   Glucose-Capillary 152 (H) 65 - 99 mg/dL   Comment 1 Notify RN    Comment 2 Document in Chart     Dg Chest Portable 1 View  Result Date: 07/16/2017 CLINICAL DATA:  Shortness of breath. Decreased lower extremity edema. EXAM: PORTABLE CHEST 1 VIEW COMPARISON:  Chest x-ray dated July 10, 2017. FINDINGS: Stable cardiomegaly status post CABG. Mild vascular congestion. Unchanged scarring/atelectasis of the left lung base. No focal consolidation, pleural effusion, or pneumothorax. No acute osseous abnormality. IMPRESSION: Stable cardiomegaly and mild vascular congestion. Electronically Signed   By: Titus Dubin M.D.   On: 07/16/2017 09:58    Review of Systems  Constitutional: Positive for diaphoresis and malaise/fatigue.  HENT: Positive for congestion.   Eyes: Negative.   Respiratory: Positive for shortness of breath.   Cardiovascular: Positive for orthopnea, leg swelling and PND.  Gastrointestinal: Positive for heartburn.  Genitourinary: Negative.   Musculoskeletal: Positive for back pain and myalgias.  Skin: Negative.   Neurological: Positive for dizziness and weakness.  Endo/Heme/Allergies: Negative.   Psychiatric/Behavioral: Negative.    Blood pressure 136/82, pulse 88, temperature 98.3 F (36.8 C), temperature source Oral, resp. rate 17, weight 187 lb 3.2 oz (84.9 kg), SpO2 94 %. Physical Exam  Nursing note and vitals reviewed. Constitutional: He is oriented to person, place, and time. He appears well-developed and well-nourished.  HENT:  Head: Normocephalic and atraumatic.  Eyes: Conjunctivae and EOM are normal. Pupils are equal, round, and reactive to light.  Neck: Normal range of motion. Neck supple.  Cardiovascular:  Normal rate and regular rhythm. Exam reveals gallop.  Murmur heard. Respiratory: Effort normal and breath sounds normal.  GI: Soft. Bowel sounds are normal.  Musculoskeletal: Normal range of motion. He exhibits edema.  Neurological: He is alert and oriented to person, place, and time. He has normal reflexes.  Skin: Skin is warm and dry.  Psychiatric: He has a normal mood and affect.    Assessment/Plan: Acute influenza A Shortness of breath  Congestive heart failure Cardiomyopathy ejection fraction of 35% End-stage renal disease on dialysis Hypertension Coronary artery disease History of myocardial infarction Coronary bypass surgery Hyperlipidemia Peripheral vascular disease History of TIA CVA Generalized weakness . Plan Agree with admission rule out for microinfarction Continue evaluation for congestive heart failure symptoms Agree with dialysis therapy Agree with lipid management and control with Crestor Recommend continue Protonix therapy for reflux symptoms Borderline elevated troponin Agree with Tamiflu therapy for influenza Consider IV steroid treatment Consider inhalers for mild wheezing Recommend conservative cardiac input except for heart failure therapy   Natacia Chaisson D Aneyah Lortz 07/17/2017, 8:44 AM

## 2017-07-17 NOTE — Progress Notes (Addendum)
[facility] [place] NAME: Terry Tyler    MR#:  [id number]  DATE OF BIRTH:  Jun 01, 1951  SUBJECTIVE: Admitted yesterday for generalized weakness, cough, found to have type a influenza.  Patient seen during hemodialysis, he says he feels much better than yesterday, we will continue Tamiflu, physical therapy evaluation, likely discharge tomorrow.  Denies further shortness of breath.  CHIEF COMPLAINT:   Chief Complaint  Patient presents with  . Weakness  . Shortness of Breath    REVIEW OF SYSTEMS:   ROS CONSTITUTIONAL: No fever, fatigue or weakness.  EYES: No blurred or double vision.  EARS, NOSE, AND THROAT: No tinnitus or ear pain.  RESPIRATORY: No cough, shortness of breath, wheezing or hemoptysis.  CARDIOVASCULAR: No chest pain, orthopnea, edema.  GASTROINTESTINAL: No nausea, vomiting, diarrhea or abdominal pain.  GENITOURINARY: No dysuria, hematuria.  ENDOCRINE: No polyuria, nocturia,  HEMATOLOGY: No anemia, easy bruising or bleeding SKIN: No rash or lesion. MUSCULOSKELETAL: No joint pain or arthritis.   NEUROLOGIC: No tingling, numbness, weakness.  PSYCHIATRY: No anxiety or depression.   DRUG ALLERGIES:   Allergies  Allergen Reactions  . Lisinopril Swelling  . Atorvastatin Calcium Other (See Comments)    Muscle cramps   . Ezetimibe Other (See Comments)    Cannot remember what the allergy was.  . Lipitor [Atorvastatin] Other (See Comments)    Muscle cramping    VITALS:  Blood pressure 133/76, pulse 85, temperature 98.3 F (36.8 C), temperature source Oral, resp. rate (!) 24, weight 84.9 kg (187 lb 2.7 oz), SpO2 100 %.  PHYSICAL EXAMINATION:  GENERAL:  67 y.o.-year-old patient lying in the bed with no acute distress.  EYES: Pupils equal, round, reactive to light. No scleral icterus. Extraocular muscles intact.  HEENT: Head atraumatic, normocephalic. Oropharynx and nasopharynx clear.  NECK:  Supple, no jugular  venous distention. No thyroid enlargement, no tenderness.  LUNGS: Normal breath sounds bilaterally, no wheezing, rales,rhonchi or crepitation. No use of accessory muscles of respiration.  CARDIOVASCULAR: S1, S2 normal. No murmurs, rubs, or gallops.  ABDOMEN: Soft, nontender, nondistended. Bowel sounds present. No organomegaly or mass.  EXTREMITIES: No pedal edema, cyanosis, or clubbing.  NEUROLOGIC: Cranial nerves II through XII are intact. Muscle strength 5/5 in all extremities. Sensation intact. Gait not checked.  PSYCHIATRIC: The patient is alert and oriented x 3.  SKIN: No obvious rash, lesion, or ulcer.    LABORATORY PANEL:   CBC Recent Labs  Lab 07/16/17 0900  WBC 8.4  HGB 10.7*  HCT 32.3*  PLT 88*   ------------------------------------------------------------------------------------------------------------------  Chemistries  Recent Labs  Lab 07/16/17 0900  NA 134*  K 4.0  CL 90*  CO2 29  GLUCOSE 183*  BUN 20  CREATININE 4.69*  CALCIUM 9.2  AST 84*  ALT 33  ALKPHOS 477*  BILITOT 5.9*   ------------------------------------------------------------------------------------------------------------------  Cardiac Enzymes Recent Labs  Lab 07/16/17 2323  TROPONINI 0.70*   ------------------------------------------------------------------------------------------------------------------  RADIOLOGY:  Dg Chest Portable 1 View  Result Date: 07/16/2017 CLINICAL DATA:  Shortness of breath. Decreased lower extremity edema. EXAM: PORTABLE CHEST 1 VIEW COMPARISON:  Chest x-ray dated July 10, 2017. FINDINGS: Stable cardiomegaly status post CABG. Mild vascular congestion. Unchanged scarring/atelectasis of the left lung base. No focal consolidation, pleural effusion, or pneumothorax. No acute osseous abnormality. IMPRESSION: Stable cardiomegaly and mild vascular congestion. Electronically Signed   By: Titus Dubin M.D.   On: 07/16/2017 09:58    EKG:   Orders placed or  performed during the hospital encounter of 07/16/17  . EKG 12-Lead  . EKG 12-Lead    ASSESSMENT AND PLAN:   Generalized weakness and cough due to influenza A: Getting Tamiflu with the dialysis.  And he feels better than yesterday. 2.  ESRD on hemodialysis: Continue hemodialysis Monday, Wednesday, Friday. 3.  Chronic systolic heart failure with EF 35%. 4.  Generalized weakness: Patient will get physical therapy evaluation. 5.slightly elevated troponin likely due  Demand ischemia to influenza;cardiology eval appreciated.pt already on coumadin 6./o CAD,CABG; Likely Discharge tomorrow     All the records are reviewed and case discussed with Care Management/Social Workerr. Management plans discussed with the patient, family and they are in agreement.  CODE STATUS: full  TOTAL TIME TAKING CARE OF THIS PATIENT: 35 minutes.   POSSIBLE D/C IN 1-2 DAYS, DEPENDING ON CLINICAL CONDITION.   Epifanio Lesches M.D on 07/17/2017 at 4:18 PM  Between 7am to 6pm - Pager - [phone]  After 6pm go to [email or web address] - password [email or web address] [facility]  Office  [phone]  CC: Primary care physician; Center, [facility]   Note: This dictation was prepared with Dragon dictation along with smaller phrase technology. Any transcriptional errors that result from this process are unintentional.

## 2017-07-17 NOTE — Progress Notes (Signed)
HD started. 

## 2017-07-17 NOTE — Progress Notes (Signed)
[facility], [place] 07/17/17  Subjective:   Patient known to our practice from previous admission He has end-stage renal disease and gets dialysis at [facility]  He is admitted for generalized weakness, cough.  He tested positive for influenza A Today, he appears to have more strength.  Voice sounds stronger.  Denies any acute shortness of breath, nausea or vomiting  Patient seen during dialysis Tolerating well    HEMODIALYSIS FLOWSHEET:  Blood Flow Rate (mL/min): 400 mL/min Arterial Pressure (mmHg): -90 mmHg Venous Pressure (mmHg): 110 mmHg Transmembrane Pressure (mmHg): 70 mmHg Ultrafiltration Rate (mL/min): 570 mL/min Dialysate Flow Rate (mL/min): 400 ml/min Conductivity: Machine : 14 Conductivity: Machine : 14 Dialysis Fluid Bolus: Normal Saline Bolus Amount (mL): 250 mL     Objective:  Vital signs in last 24 hours:  Temp:  [97.2 F (36.2 C)-99.4 F (37.4 C)] 98.3 F (36.8 C) (02/05 2018) Pulse Rate:  [85-109] 87 (02/06 1000) Resp:  [17-24] 24 (02/06 1000) BP: (130-142)/(75-87) 133/87 (02/06 1000) SpO2:  [87 %-100 %] 100 % (02/06 1000) Weight:  [84.9 kg (187 lb 2.7 oz)-84.9 kg (187 lb 3.2 oz)] 84.9 kg (187 lb 2.7 oz) (02/06 0930)  Weight change:  Filed Weights   07/17/17 0345 07/17/17 0930  Weight: 84.9 kg (187 lb 3.2 oz) 84.9 kg (187 lb 2.7 oz)    Intake/Output:    Intake/Output Summary (Last 24 hours) at 07/17/2017 1248 Last data filed at 07/17/2017 0500 Gross per 24 hour  Intake -  Output 600 ml  Net -600 ml     Physical Exam: General:  laying in the bed  HEENT  oxygen by nasal cannula, moist oral mucous membranes  Neck  supple  Pulm/lungs  normal breathing effort, clear to auscultation anteriorly and laterally  CVS/Heart  no rub or gallop  Abdomen:   Soft, distended, nontender  Extremities:  trace pitting edema bilaterally   Neurologic:  Alert able to answer questions  Skin:  No acute rashes  Access:  Left  forearm AV fistula       Basic Metabolic Panel:  Recent Labs  Lab 07/16/17 0900  NA 134*  K 4.0  CL 90*  CO2 29  GLUCOSE 183*  BUN 20  CREATININE 4.69*  CALCIUM 9.2     CBC: Recent Labs  Lab 07/16/17 0900  WBC 8.4  HGB 10.7*  HCT 32.3*  MCV 81.3  PLT 88*      Lab Results  Component Value Date   HEPBSAG Negative 02/20/2017      Microbiology:  Recent Results (from the past 240 hour(s))  MRSA PCR Screening     Status: None   Collection Time: 07/17/17  4:28 AM  Result Value Ref Range Status   MRSA by PCR NEGATIVE NEGATIVE Final    Comment:        The GeneXpert MRSA Assay (FDA approved for NASAL specimens only), is one component of a comprehensive MRSA colonization surveillance program. It is not intended to diagnose MRSA infection nor to guide or monitor treatment for MRSA infections. Performed at [facility], [address] [place], [place] [address]     Coagulation Studies: Recent Labs    07/16/17 1304 07/17/17 0332  LABPROT 15.6* 15.5*  INR 1.25 1.24    Urinalysis: No results for input(s): COLORURINE, LABSPEC, PHURINE, GLUCOSEU, HGBUR, BILIRUBINUR, KETONESUR, PROTEINUR, UROBILINOGEN, NITRITE, LEUKOCYTESUR in the last 72 hours.  Invalid input(s): APPERANCEUR    Imaging: Dg Chest Portable 1 View  Result  Date: 07/16/2017 CLINICAL DATA:  Shortness of breath. Decreased lower extremity edema. EXAM: PORTABLE CHEST 1 VIEW COMPARISON:  Chest x-ray dated July 10, 2017. FINDINGS: Stable cardiomegaly status post CABG. Mild vascular congestion. Unchanged scarring/atelectasis of the left lung base. No focal consolidation, pleural effusion, or pneumothorax. No acute osseous abnormality. IMPRESSION: Stable cardiomegaly and mild vascular congestion. Electronically Signed   By: Titus Dubin M.D.   On: 07/16/2017 09:58     Medications:    . aspirin EC  81 mg Oral Daily  . gabapentin  100 mg Oral TID  . hydrALAZINE  50 mg Oral BID  .  insulin aspart  0-15 Units Subcutaneous TID WC  . insulin aspart  0-5 Units Subcutaneous QHS  . methylPREDNISolone (SOLU-MEDROL) injection  60 mg Intravenous Q24H  . metoprolol tartrate  25 mg Oral Daily  . oseltamivir  30 mg Oral Q M,W,F-HD  . pantoprazole  40 mg Oral BID AC  . rosuvastatin  20 mg Oral QHS  . sodium chloride flush  3 mL Intravenous Q12H  . warfarin  6 mg Oral q1800  . Warfarin - Pharmacist Dosing Inpatient   Does not apply q1800   acetaminophen **OR** acetaminophen, albuterol, ondansetron **OR** ondansetron (ZOFRAN) IV, polyethylene glycol, simethicone  Assessment/ Plan:  66 y.o.  African-American male with end-stage renal disease, hemodialysis, hypertension, CABG, TIA, CVA, diabetes insulin-dependent, peripheral vascular disease  MWF [facility] [place] Left AVF  1.  End-stage renal disease 2.  Hypertension 3.  Anemia of chronic kidney disease 4.  Secondary hyperparathyroidism 5.  Respiratory distress, influenza A 6.  Lower extremity edema  Patient is admitted with influenza A.  He is profoundly weak and has some cough and shortness of breath.  His respiratory illness is likely a combination of some fluid and influenza A.  He is requiring oxygen by nasal cannula   Continue HD MWF schedule  May restart Lasix 80 BID when able to take normal PO  Monitor Phos during hospitalization.  No binders listed in home medications Treatment of influenza as per hospitalist team   LOS: [place] 2/6/201912:48 [place], [place]

## 2017-07-17 NOTE — Progress Notes (Addendum)
MD Pyreddy made aware of new troponin level. No new orders obtained. PT is asymptomatic; no c/o chest pain. Will continue to monitor.

## 2017-07-17 NOTE — Progress Notes (Signed)
ANTICOAGULATION CONSULT NOTE - Initial Consult  Pharmacy Consult for Warfarin Indication: atrial fibrillation  Allergies  Allergen Reactions  . Lisinopril Swelling  . Atorvastatin Calcium Other (See Comments)    Muscle cramps   . Ezetimibe Other (See Comments)    Cannot remember what the allergy was.  . Lipitor [Atorvastatin] Other (See Comments)    Muscle cramping    Patient Measurements: Weight: 187 lb 3.2 oz (84.9 kg) Heparin Dosing Weight:    Vital Signs: Temp: 98.3 F (36.8 C) (02/05 2018) Temp Source: Oral (02/05 2018) BP: 136/82 (02/06 0345) Pulse Rate: 88 (02/06 0345)  Labs: Recent Labs    07/16/17 0900 07/16/17 1304 07/16/17 1607 07/16/17 2323 07/17/17 0332  HGB 10.7*  --   --   --   --   HCT 32.3*  --   --   --   --   PLT 88*  --   --   --   --   LABPROT  --  15.6*  --   --  15.5*  INR  --  1.25  --   --  1.24  CREATININE 4.69*  --   --   --   --   CKTOTAL 130  --   --   --   --   TROPONINI 0.42*  --  0.49* 0.70*  --     Estimated Creatinine Clearance: 17.5 mL/min (A) (by C-G formula based on SCr of 4.69 mg/dL (H)).   Medical History: Past Medical History:  Diagnosis Date  . C. difficile diarrhea   . CKD (chronic kidney disease)   . Coronary atherosclerosis of unspecified type of vessel, native or graft    Myocardial infarction in 2006. Cardiac catheterization showed significant three-vessel coronary artery disease. He underwent CABG at [facility]. Most recent cardiac catheterization in 2010 showed normal LV systolic function, patent grafts to OM1, RCA and LAD. Occluded SVG to diagonal.  . 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 Dialysis patient [facility])    Mon. Wed. Fri. for dialysis  . Hyperlipidemia   . Hypertension   . Hypertension, renal disease   . Impotence of organic origin   . Keloid scar   . Myocardial infarction ([place]) 2006  . Neoplasm of uncertain behavior, site unspecified   . Peripheral vascular disease,  unspecified ([place])   . Proteinuria   . Stroke [facility]) 04/2016   TIA  . Unspecified vitamin D deficiency     Medications:  Scheduled:  . aspirin EC  81 mg Oral Daily  . gabapentin  100 mg Oral TID  . hydrALAZINE  50 mg Oral BID  . insulin aspart  0-15 Units Subcutaneous TID WC  . insulin aspart  0-5 Units Subcutaneous QHS  . methylPREDNISolone (SOLU-MEDROL) injection  60 mg Intravenous Q24H  . metoprolol tartrate  25 mg Oral Daily  . oseltamivir  30 mg Oral Q M,W,F-HD  . pantoprazole  40 mg Oral BID AC  . rosuvastatin  20 mg Oral QHS  . sodium chloride flush  3 mL Intravenous Q12H  . Warfarin - Pharmacist Dosing Inpatient   Does not apply q1800   Infusions:    Assessment: 67 yo M on Warfarin PTA. Hemodialysis patient. Influenza A positive. Hx chronic mild thrombocytopenia. Home warfarin dose per Med Rec:  4 mg daily.  DATE INR DOSE 2/5 1.25 6 mg x 1 2/6 1.24    Goal of Therapy:  INR 2-3 Monitor platelets by anticoagulation protocol:  Yes   Plan:  Slightly increased warfarin dose was given yesterday. Will give another 6 mg po x 1 due to subtherapeutic INR.   Laural Benes, Pharm.D., BCPS Clinical Pharmacist 07/17/2017,7:20 AM

## 2017-07-18 LAB — PROTIME-INR
INR: 1.39
Prothrombin Time: 16.9 seconds — ABNORMAL HIGH (ref 11.4–15.2)

## 2017-07-18 LAB — GLUCOSE, CAPILLARY
GLUCOSE-CAPILLARY: 149 mg/dL — AB (ref 65–99)
GLUCOSE-CAPILLARY: 179 mg/dL — AB (ref 65–99)
GLUCOSE-CAPILLARY: 226 mg/dL — AB (ref 65–99)
Glucose-Capillary: 221 mg/dL — ABNORMAL HIGH (ref 65–99)

## 2017-07-18 NOTE — Progress Notes (Signed)
[facility], [place] 07/18/17  Subjective:   Patient known to our practice from previous admission He has end-stage renal disease and gets dialysis at [facility]  He is admitted for generalized weakness, cough.  He tested positive for influenza A Today, he appears well but is still requiring oxygen    Objective:  Vital signs in last 24 hours:  Temp:  [98 F (36.7 C)-98.2 F (36.8 C)] 98.1 F (36.7 C) (02/07 0900) Pulse Rate:  [81-91] 81 (02/07 0837) Resp:  [16-18] 16 (02/07 0837) BP: (122-134)/(73-81) 122/73 (02/07 0837) SpO2:  [98 %-100 %] 100 % (02/07 0837) Weight:  [86 kg (189 lb 11.2 oz)] 86 kg (189 lb 11.2 oz) (02/07 0335)  Weight change: -0.013 kg (-0.5 oz) Filed Weights   07/17/17 0345 07/17/17 0930 07/18/17 0335  Weight: 84.9 kg (187 lb 3.2 oz) 84.9 kg (187 lb 2.7 oz) 86 kg (189 lb 11.2 oz)    Intake/Output:    Intake/Output Summary (Last 24 hours) at 07/18/2017 1430 Last data filed at 07/18/2017 1234 Gross per 24 hour  Intake -  Output 400 ml  Net -400 ml     Physical Exam: General:  laying in the bed  HEENT  oxygen by nasal cannula, moist oral mucous membranes  Neck  supple  Pulm/lungs  normal breathing effort, coarse crackle b/l  CVS/Heart  no rub or gallop  Abdomen:   Soft, distended, nontender  Extremities:  trace pitting edema right leg  Neurologic:  Alert able to answer questions  Skin:  No acute rashes  Access:  Left forearm AV fistula       Basic Metabolic Panel:  Recent Labs  Lab 07/16/17 0900  NA 134*  K 4.0  CL 90*  CO2 29  GLUCOSE 183*  BUN 20  CREATININE 4.69*  CALCIUM 9.2     CBC: Recent Labs  Lab 07/16/17 0900  WBC 8.4  HGB 10.7*  HCT 32.3*  MCV 81.3  PLT 88*      Lab Results  Component Value Date   HEPBSAG Negative 02/20/2017      Microbiology:  Recent Results (from the past 240 hour(s))  MRSA PCR Screening     Status: None   Collection Time: 07/17/17  4:28 AM  Result  Value Ref Range Status   MRSA by PCR NEGATIVE NEGATIVE Final    Comment:        The GeneXpert MRSA Assay (FDA approved for NASAL specimens only), is one component of a comprehensive MRSA colonization surveillance program. It is not intended to diagnose MRSA infection nor to guide or monitor treatment for MRSA infections. Performed at [facility], [address] [place], [place] [address]     Coagulation Studies: Recent Labs    07/16/17 1304 07/17/17 0332 07/18/17 0638  LABPROT 15.6* 15.5* 16.9*  INR 1.25 1.24 1.39    Urinalysis: No results for input(s): COLORURINE, LABSPEC, PHURINE, GLUCOSEU, HGBUR, BILIRUBINUR, KETONESUR, PROTEINUR, UROBILINOGEN, NITRITE, LEUKOCYTESUR in the last 72 hours.  Invalid input(s): APPERANCEUR    Imaging: No results found.   Medications:    . aspirin EC  81 mg Oral Daily  . gabapentin  100 mg Oral TID  . hydrALAZINE  50 mg Oral BID  . insulin aspart  0-15 Units Subcutaneous TID WC  . insulin aspart  0-5 Units Subcutaneous QHS  . methylPREDNISolone (SOLU-MEDROL) injection  60 mg Intravenous Q24H  . metoprolol succinate  25 mg Oral Daily  . oseltamivir  30  mg Oral Q M,W,F-HD  . pantoprazole  40 mg Oral BID AC  . rosuvastatin  20 mg Oral QHS  . sodium chloride flush  3 mL Intravenous Q12H  . warfarin  6 mg Oral q1800  . Warfarin - Pharmacist Dosing Inpatient   Does not apply q1800   acetaminophen **OR** acetaminophen, albuterol, ondansetron **OR** ondansetron (ZOFRAN) IV, polyethylene glycol, simethicone  Assessment/ Plan:  67 y.o.  African-American male with end-stage renal disease, hemodialysis, hypertension, CABG, TIA, CVA, diabetes insulin-dependent, peripheral vascular disease  MWF [facility] Left AVF  1.  End-stage renal disease 2.  Hypertension 3.  Anemia of chronic kidney disease 4.  Secondary hyperparathyroidism 5.  Respiratory distress, influenza A 6.  Lower extremity edema  Patient is  admitted with influenza A.  He is profoundly weak and has some cough and shortness of breath.  His respiratory illness is likely a combination of some fluid and influenza A.  He is requiring oxygen by nasal cannula   Continue HD MWF schedule  May restart Lasix 80 BID when able to take normal PO  Monitor Phos during hospitalization.  No binders listed in home medications Treatment of influenza as per hospitalist team   LOS: [place] 2/7/20192:30 PM  [place], [place]

## 2017-07-18 NOTE — Evaluation (Signed)
Physical Therapy Evaluation Patient Details Name: Terry Tyler MRN: [id number] DOB: 1950-10-11 Today's Date: 07/18/2017   History of Present Illness  Pt is a82 y.o.malewith a known history of diabetes mellitus, hypertension, incisional disease, CAD, chronic systolic CHF with ejection fraction 35% presented to the emergency room due to generalized weakness. Patient had his routine hemodialysis and was thought to be more fluid overloaded and with generalized weakness was asked to go to the emergency room. Patient chose to go home. He then had worsening weakness, shortness of breath, cough and wheezing and presented to the emergency room.  Patient's chest x-ray shows vascular congestion.. Influenza A is positive. Troponin found to be 0.48 with no chest pain.  The patient underwent drug-eluting stent to the saphenous vein graft to the PDA in April2018.  Assessment includes: Generalized weakness and cough due to influenza, getting Tamiflu with the dialysis, Chronic systolic heart failure with EF 35%, generalized weakness, and slightly elevated troponin likely due to demand ischemia with influenza.  Per cardiology no workup planned and OK for pt to participate with physial therapy.     Clinical Impression  Pt presents with deficits in strength, transfers, mobility, gait, balance, and activity tolerance.  Pt required min A with BLEs in and out of bed during bed mobility tasks.  Pt required close CGA during transfers with effortful standing from EOB and poor eccentric control during sitting that did improve somewhat with cues for sequencing.  Inquired if pt had a lift chair at home with pt confirming that he does and uses it to assist with transfers.  Educated pt on risks of decline in functional strength over time with use of lift chairs out of convenience with pt agreeing to only use lift feature to minimum height where he can stand safely.  Pt's vital signs at rest on 2LO2/min include SpO2 98% and HR  80 bpm. MD requested SpO2 results with activity on room:  After therex: 97% with HR 80 bpm.  After amb 60': 98% with HR 92 bpm. After amb 100': 93% with HR 95 bpm.  Pt steady during amb trials with RW and CGA with slow cadence and short B step length.  Pt will benefit from HHPT services upon discharge to safely address above deficits for decreased caregiver assistance and eventual return to PLOF.      Follow Up Recommendations Home health PT    Equipment Recommendations  None recommended by PT    Recommendations for Other Services       Precautions / Restrictions Precautions Precautions: Fall Precaution Comments: Droplet precautions for influenza Restrictions Weight Bearing Restrictions: No      Mobility  Bed Mobility Overal bed mobility: Needs Assistance Bed Mobility: Supine to Sit;Sit to Supine     Supine to sit: Min assist Sit to supine: Min assist   General bed mobility comments: Min A for BLEs in and out of bed  Transfers Overall transfer level: Needs assistance Equipment used: Rolling walker (2 wheeled) Transfers: Sit to/from Stand Sit to Stand: Min guard         General transfer comment: Effortful standing from EOB with poor eccentric control during stand to sit that improved with cues  Ambulation/Gait Ambulation/Gait assistance: Min guard Ambulation Distance (Feet): 100 Feet Assistive device: Rolling walker (2 wheeled) Gait Pattern/deviations: Step-through pattern;Decreased step length - right;Decreased step length - left   Gait velocity interpretation: Below normal speed for age/gender General Gait Details: Slow cadence with amb with short B step length but steady  without LOB  Stairs Stairs: (Deferred)          Wheelchair Mobility    Modified Rankin (Stroke Patients Only)       Balance Overall balance assessment: Needs assistance Sitting-balance support: Feet supported;No upper extremity supported Sitting balance-Leahy Scale: Good      Standing balance support: Bilateral upper extremity supported Standing balance-Leahy Scale: Good                               Pertinent Vitals/Pain Pain Assessment: No/denies pain    Home Living Family/patient expects to be discharged to:: Private residence Living Arrangements: Spouse/significant other Available Help at Discharge: Family;Available PRN/intermittently Type of Home: Mobile home Home Access: Stairs to enter Entrance Stairs-Rails: Right;Left;Can reach both Entrance Stairs-Number of Steps: 4 Home Layout: One level Home Equipment: Walker - 2 wheels;Walker - 4 wheels;Cane - quad      Prior Function Level of Independence: Independent with assistive device(s)         Comments: Mod Ind amb limited community distances with QC in home, rollator outdoors around home, and RW in community, no fall history, Ind with ADLs     Hand Dominance   Dominant Hand: Right    Extremity/Trunk Assessment   Upper Extremity Assessment Upper Extremity Assessment: Overall WFL for tasks assessed    Lower Extremity Assessment Lower Extremity Assessment: Generalized weakness       Communication   Communication: No difficulties  Cognition Arousal/Alertness: Awake/alert Behavior During Therapy: WFL for tasks assessed/performed Overall Cognitive Status: Within Functional Limits for tasks assessed                                        General Comments      Exercises Total Joint Exercises Ankle Circles/Pumps: AROM;Both;5 reps;10 reps Long Arc Quad: AROM;Both;5 reps;10 reps Knee Flexion: AROM;Both;5 reps;10 reps Marching in Standing: AROM;Both;5 reps;10 reps;Standing;Seated   Assessment/Plan    PT Assessment Patient needs continued PT services  PT Problem List Decreased strength;Decreased activity tolerance;Decreased balance;Decreased mobility       PT Treatment Interventions DME instruction;Gait training;Stair training;Functional mobility  training;Balance training;Therapeutic exercise;Therapeutic activities;Patient/family education    PT Goals (Current goals can be found in the Care Plan section)  Acute Rehab PT Goals Patient Stated Goal: To walk better PT Goal Formulation: With patient Time For Goal Achievement: 07/31/17 Potential to Achieve Goals: Good    Frequency Min 2X/week   Barriers to discharge        Co-evaluation               AM-PAC PT "6 Clicks" Daily Activity  Outcome Measure Difficulty turning over in bed (including adjusting bedclothes, sheets and blankets)?: Unable Difficulty moving from lying on back to sitting on the side of the bed? : Unable Difficulty sitting down on and standing up from a chair with arms (e.g., wheelchair, bedside commode, etc,.)?: Unable Help needed moving to and from a bed to chair (including a wheelchair)?: A Little Help needed walking in hospital room?: A Little Help needed climbing 3-5 steps with a railing? : A Little 6 Click Score: 12    End of Session Equipment Utilized During Treatment: Gait belt;Oxygen Activity Tolerance: Patient tolerated treatment well Patient left: in bed;with call bell/phone within reach;with bed alarm set Nurse Communication: Mobility status;Other (comment)(SpO2 results with Amb on room air) PT  Visit Diagnosis: Muscle weakness (generalized) (M62.81);Difficulty in walking, not elsewhere classified (R26.2)    Time: 2542-7062 PT Time Calculation (min) (ACUTE ONLY): 29 min   Charges:   PT Evaluation $PT Eval Low Complexity: 1 Low PT Treatments $Therapeutic Exercise: 8-22 mins   PT G Codes:        DRoyetta Asal PT, DPT 07/18/17, 4:31 PM

## 2017-07-18 NOTE — Care Management (Signed)
Discussed weaning oxygen during progression

## 2017-07-18 NOTE — Progress Notes (Signed)
ANTICOAGULATION CONSULT NOTE - FOLLOW UP Consult  Pharmacy Consult for Warfarin Indication: atrial fibrillation  Allergies  Allergen Reactions  . Lisinopril Swelling  . Atorvastatin Calcium Other (See Comments)    Muscle cramps   . Ezetimibe Other (See Comments)    Cannot remember what the allergy was.  . Lipitor [Atorvastatin] Other (See Comments)    Muscle cramping    Patient Measurements: Weight: 189 lb 11.2 oz (86 kg) Heparin Dosing Weight:    Vital Signs: Temp: 98.1 F (36.7 C) (02/07 0900) Temp Source: Oral (02/07 0900) BP: 122/73 (02/07 0837) Pulse Rate: 81 (02/07 0837)  Labs: Recent Labs    07/16/17 0900 07/16/17 1304 07/16/17 1607 07/16/17 2323 07/17/17 0332 07/18/17 0638  HGB 10.7*  --   --   --   --   --   HCT 32.3*  --   --   --   --   --   PLT 88*  --   --   --   --   --   LABPROT  --  15.6*  --   --  15.5* 16.9*  INR  --  1.25  --   --  1.24 1.39  CREATININE 4.69*  --   --   --   --   --   CKTOTAL 130  --   --   --   --   --   TROPONINI 0.42*  --  0.49* 0.70*  --   --     Estimated Creatinine Clearance: 17.5 mL/min (A) (by C-G formula based on SCr of 4.69 mg/dL (H)).   Medical History: Past Medical History:  Diagnosis Date  . C. difficile diarrhea   . CKD (chronic kidney disease)   . Coronary atherosclerosis of unspecified type of vessel, native or graft    Myocardial infarction in 2006. Cardiac catheterization showed significant three-vessel coronary artery disease. He underwent CABG at [facility]. Most recent cardiac catheterization in 2010 showed normal LV systolic function, patent grafts to OM1, RCA and LAD. Occluded SVG to diagonal.  . 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 Dialysis patient [facility])    Mon. Wed. Fri. for dialysis  . Hyperlipidemia   . Hypertension   . Hypertension, renal disease   . Impotence of organic origin   . Keloid scar   . Myocardial infarction ([place]) 2006  . Neoplasm of uncertain behavior,  site unspecified   . Peripheral vascular disease, unspecified ([place])   . Proteinuria   . Stroke [facility]) 04/2016   TIA  . Unspecified vitamin D deficiency     Medications:  Scheduled:  . aspirin EC  81 mg Oral Daily  . gabapentin  100 mg Oral TID  . hydrALAZINE  50 mg Oral BID  . insulin aspart  0-15 Units Subcutaneous TID WC  . insulin aspart  0-5 Units Subcutaneous QHS  . methylPREDNISolone (SOLU-MEDROL) injection  60 mg Intravenous Q24H  . metoprolol succinate  25 mg Oral Daily  . oseltamivir  30 mg Oral Q M,W,F-HD  . pantoprazole  40 mg Oral BID AC  . rosuvastatin  20 mg Oral QHS  . sodium chloride flush  3 mL Intravenous Q12H  . warfarin  6 mg Oral q1800  . Warfarin - Pharmacist Dosing Inpatient   Does not apply q1800   Infusions:    Assessment: 67 yo M on Warfarin PTA. Hemodialysis patient. Influenza A positive. Hx chronic mild thrombocytopenia. Home warfarin dose per Med  Rec:  4 mg daily.  DATE INR DOSE 2/5 1.25 6 mg x 1 2/6 1.24 6 mg  2/7        1.39    6 mg    Goal of Therapy:  INR 2-3 Monitor platelets by anticoagulation protocol: Yes   Plan:  Slightly increased warfarin dose was given yesterday. Will give another 6 mg po x 1 due to subtherapeutic INR.   Jaiden Wahab D, Pharm.D., BCPS Clinical Pharmacist 07/18/2017,12:32 PM

## 2017-07-18 NOTE — Progress Notes (Signed)
Patient was seen by me earlier on the consult patient has borderline troponins of 0.7.  In the face of renal failure I think these low troponins are insignificant and probably represent demand ischemia as well as poor renal clearance.  Patient should be acceptable physical therapy patient at this point. Recommend continue medical therapy

## 2017-07-18 NOTE — Progress Notes (Signed)
[place] [facility] [place] NAME: Mcgregor Tinnon    MR#:  [id number]  DATE OF BIRTH:  07-15-1950  SUBJECTIVE: He says he feels better today does have some cough but improving.  No chest pain.  Requesting oxygen to go home with if needed.  CHIEF COMPLAINT:   Chief Complaint  Patient presents with  . Weakness  . Shortness of Breath    REVIEW OF SYSTEMS:   ROS CONSTITUTIONAL: No fever, fatigue or weakness.  EYES: No blurred or double vision.  EARS, NOSE, AND THROAT: No tinnitus or ear pain.  RESPIRATORY: Cough and green phlegm.  CARDIOVASCULAR: No chest pain, orthopnea, edema.  GASTROINTESTINAL: No nausea, vomiting, diarrhea or abdominal pain.  GENITOURINARY: No dysuria, hematuria.  ENDOCRINE: No polyuria, nocturia,  HEMATOLOGY: No anemia, easy bruising or bleeding SKIN: No rash or lesion. MUSCULOSKELETAL: No joint pain or arthritis.   NEUROLOGIC: No tingling, numbness, weakness.  PSYCHIATRY: No anxiety or depression.   DRUG ALLERGIES:   Allergies  Allergen Reactions  . Lisinopril Swelling  . Atorvastatin Calcium Other (See Comments)    Muscle cramps   . Ezetimibe Other (See Comments)    Cannot remember what the allergy was.  . Lipitor [Atorvastatin] Other (See Comments)    Muscle cramping    VITALS:  Blood pressure 122/73, pulse 81, temperature 98.1 F (36.7 C), temperature source Oral, resp. rate 16, weight 86 kg (189 lb 11.2 oz), SpO2 100 %.  PHYSICAL EXAMINATION:  GENERAL:  67 y.o.-year-old patient lying in the bed with no acute distress.  EYES: Pupils equal, round, reactive to light. No scleral icterus. Extraocular muscles intact.  HEENT: Head atraumatic, normocephalic. Oropharynx and nasopharynx clear.  NECK:  Supple, no jugular venous distention. No thyroid enlargement, no tenderness.  LUNGS: Normal breath sounds bilaterally, no wheezing, rales,rhonchi or crepitation. No use of accessory muscles of respiration.   CARDIOVASCULAR: S1, S2 normal. No murmurs, rubs, or gallops.  ABDOMEN: Soft, nontender, nondistended. Bowel sounds present. No organomegaly or mass.  EXTREMITIES: No pedal edema, cyanosis, or clubbing.  NEUROLOGIC: Cranial nerves II through XII are intact. Muscle strength 5/5 in all extremities. Sensation intact. Gait not checked.  PSYCHIATRIC: The patient is alert and oriented x 3.  SKIN: No obvious rash, lesion, or ulcer.    LABORATORY PANEL:   CBC Recent Labs  Lab 07/16/17 0900  WBC 8.4  HGB 10.7*  HCT 32.3*  PLT 88*   ------------------------------------------------------------------------------------------------------------------  Chemistries  Recent Labs  Lab 07/16/17 0900  NA 134*  K 4.0  CL 90*  CO2 29  GLUCOSE 183*  BUN 20  CREATININE 4.69*  CALCIUM 9.2  AST 84*  ALT 33  ALKPHOS 477*  BILITOT 5.9*   ------------------------------------------------------------------------------------------------------------------  Cardiac Enzymes Recent Labs  Lab 07/16/17 2323  TROPONINI 0.70*   ------------------------------------------------------------------------------------------------------------------  RADIOLOGY:  Dg Chest Portable 1 View  Result Date: 07/16/2017 CLINICAL DATA:  Shortness of breath. Decreased lower extremity edema. EXAM: PORTABLE CHEST 1 VIEW COMPARISON:  Chest x-ray dated July 10, 2017. FINDINGS: Stable cardiomegaly status post CABG. Mild vascular congestion. Unchanged scarring/atelectasis of the left lung base. No focal consolidation, pleural effusion, or pneumothorax. No acute osseous abnormality. IMPRESSION: Stable cardiomegaly and mild vascular congestion. Electronically Signed   By: Titus Dubin M.D.   On: 07/16/2017 09:58    EKG:   Orders placed or performed during the hospital encounter of 07/16/17  . EKG 12-Lead  . EKG 12-Lead    ASSESSMENT AND PLAN:  Generalized weakness and cough due to influenza A: Getting Tamiflu with  the dialysis.  And he feels better than yesterday. Physical therapy consult today, discussed slightly elevated troponin results with Dr. Clayborn Bigness he recommended no further workup and he said he will see the patient  2.  ESRD on hemodialysis: Continue hemodialysis Monday, Wednesday, Friday.  3.  Chronic systolic heart failure with EF 35%. 4.  Generalized weakness: Patient will get physical therapy evaluation. 5.slightly elevated troponin likely due  Demand ischemia to influenza;cardiology eval appreciated.pt already on coumadin, spoke with Dr. Clayborn Bigness suggested slightly red troponin secondary to demand ischemia with his influenza and also ESRD and he is not planning on any workup and recommended that patient can have physical therapy.  6./o CAD,CABG;   Check ambulatory oxygen saturation to see if he qualifies for home oxygen.   All the records are reviewed and case discussed with Care Management/Social Workerr. Management plans discussed with the patient, family and they are in agreement.  CODE STATUS: full  TOTAL TIME TAKING CARE OF THIS PATIENT: 35 minutes.   POSSIBLE D/C IN 1-2 DAYS, DEPENDING ON CLINICAL CONDITION.   Epifanio Lesches M.D on 07/18/2017 at 9:34 AM  Between 7am to 6pm - Pager - [phone]  After 6pm go to [email or web address] - password [email or web address] [place] [facility]  Office  [phone]  CC: Primary care physician; Center, [facility]   Note: This dictation was prepared with Dragon dictation along with smaller phrase technology. Any transcriptional errors that result from this process are unintentional.

## 2017-07-19 LAB — PROTIME-INR
INR: 1.41
Prothrombin Time: 17.1 seconds — ABNORMAL HIGH (ref 11.4–15.2)

## 2017-07-19 LAB — GLUCOSE, CAPILLARY
GLUCOSE-CAPILLARY: 204 mg/dL — AB (ref 65–99)
GLUCOSE-CAPILLARY: 151 mg/dL — AB (ref 65–99)
Glucose-Capillary: 229 mg/dL — ABNORMAL HIGH (ref 65–99)

## 2017-07-19 MED ORDER — BUDESONIDE 0.25 MG/2ML IN SUSP
0.2500 mg | Freq: Two times a day (BID) | RESPIRATORY_TRACT | Status: DC
  Administered 2017-07-19 – 2017-07-20 (×2): 0.25 mg via RESPIRATORY_TRACT
  Filled 2017-07-19 (×2): qty 2

## 2017-07-19 NOTE — Progress Notes (Signed)
Pre HD assessment  

## 2017-07-19 NOTE — Progress Notes (Signed)
HD tx start 

## 2017-07-19 NOTE — Progress Notes (Signed)
ANTICOAGULATION CONSULT NOTE - FOLLOW UP Consult  Pharmacy Consult for Warfarin Indication: atrial fibrillation  Allergies  Allergen Reactions  . Lisinopril Swelling  . Atorvastatin Calcium Other (See Comments)    Muscle cramps   . Ezetimibe Other (See Comments)    Cannot remember what the allergy was.  . Lipitor [Atorvastatin] Other (See Comments)    Muscle cramping    Patient Measurements: Weight: 196 lb 13.9 oz (89.3 kg) Heparin Dosing Weight:    Vital Signs: Temp: 98.1 F (36.7 C) (02/08 1000) Temp Source: Axillary (02/08 1000) BP: 129/77 (02/08 1300) Pulse Rate: 103 (02/08 1300)  Labs: Recent Labs    07/16/17 1607 07/16/17 2323 07/17/17 0332 07/18/17 0175 07/19/17 0625  LABPROT  --   --  15.5* 16.9* 17.1*  INR  --   --  1.24 1.39 1.41  TROPONINI 0.49* 0.70*  --   --   --     Estimated Creatinine Clearance: 17.5 mL/min (A) (by C-G formula based on SCr of 4.69 mg/dL (H)).   Medical History: Past Medical History:  Diagnosis Date  . C. difficile diarrhea   . CKD (chronic kidney disease)   . Coronary atherosclerosis of unspecified type of vessel, native or graft    Myocardial infarction in 2006. Cardiac catheterization showed significant three-vessel coronary artery disease. He underwent CABG at [facility]. Most recent cardiac catheterization in 2010 showed normal LV systolic function, patent grafts to OM1, RCA and LAD. Occluded SVG to diagonal.  . 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 Dialysis patient [facility])    Mon. Wed. Fri. for dialysis  . Hyperlipidemia   . Hypertension   . Hypertension, renal disease   . Impotence of organic origin   . Keloid scar   . Myocardial infarction ([place]) 2006  . Neoplasm of uncertain behavior, site unspecified   . Peripheral vascular disease, unspecified ([place])   . Proteinuria   . Stroke [facility]) 04/2016   TIA  . Unspecified vitamin D deficiency     Medications:  Scheduled:  . aspirin EC  81 mg Oral  Daily  . gabapentin  100 mg Oral TID  . hydrALAZINE  50 mg Oral BID  . insulin aspart  0-15 Units Subcutaneous TID WC  . insulin aspart  0-5 Units Subcutaneous QHS  . methylPREDNISolone (SOLU-MEDROL) injection  60 mg Intravenous Q24H  . metoprolol succinate  25 mg Oral Daily  . oseltamivir  30 mg Oral Q M,W,F-HD  . pantoprazole  40 mg Oral BID AC  . rosuvastatin  20 mg Oral QHS  . sodium chloride flush  3 mL Intravenous Q12H  . warfarin  6 mg Oral q1800  . Warfarin - Pharmacist Dosing Inpatient   Does not apply q1800   Infusions:    Assessment: 67 yo M on Warfarin PTA. Hemodialysis patient. Influenza A positive. Hx chronic mild thrombocytopenia. Home warfarin dose per Med Rec:  4 mg daily.  DATE INR DOSE 2/5 1.25 6 mg x 1 2/6 1.24 6 mg  2/7        1.39    6 mg  2/8        1.41    6 mg    Goal of Therapy:  INR 2-3 Monitor platelets by anticoagulation protocol: Yes   Plan:  Slightly increased warfarin dose was given yesterday. Will give another 6 mg po x 1 due to subtherapeutic INR.   Linnette Panella D, Pharm.D., BCPS Clinical Pharmacist 07/19/2017,1:11 PM

## 2017-07-19 NOTE — Progress Notes (Signed)
[facility], [place] 07/19/17  Subjective:   Patient known to our practice from previous admission He has end-stage renal disease and gets dialysis at [facility]  He is admitted for generalized weakness, cough.  He tested positive for influenza A Today, he appears well but is still requiring oxygen   HEMODIALYSIS FLOWSHEET:  Blood Flow Rate (mL/min): 400 mL/min Arterial Pressure (mmHg): -170 mmHg Venous Pressure (mmHg): 200 mmHg Transmembrane Pressure (mmHg): 50 mmHg Ultrafiltration Rate (mL/min): 870 mL/min Dialysate Flow Rate (mL/min): 600 ml/min Conductivity: Machine : 13.5 Conductivity: Machine : 13.5 Dialysis Fluid Bolus: Normal Saline Bolus Amount (mL): 250 mL      Objective:  Vital signs in last 24 hours:  Temp:  [97.4 F (36.3 C)-98.1 F (36.7 C)] 98.1 F (36.7 C) (02/08 1000) Pulse Rate:  [78-85] 81 (02/08 1030) Resp:  [16-29] 27 (02/08 1030) BP: (140-150)/(79-88) 145/83 (02/08 1030) SpO2:  [98 %-100 %] 100 % (02/08 1030) Weight:  [86.7 kg (191 lb 3.2 oz)] 86.7 kg (191 lb 3.2 oz) (02/08 0500)  Weight change: 1.828 kg (4 lb 0.5 oz) Filed Weights   07/17/17 0930 07/18/17 0335 07/19/17 0500  Weight: 84.9 kg (187 lb 2.7 oz) 86 kg (189 lb 11.2 oz) 86.7 kg (191 lb 3.2 oz)    Intake/Output:    Intake/Output Summary (Last 24 hours) at 07/19/2017 1048 Last data filed at 07/19/2017 0501 Gross per 24 hour  Intake -  Output 400 ml  Net -400 ml     Physical Exam: General:  laying in the bed  HEENT  oxygen by nasal cannula, moist oral mucous membranes  Neck  supple  Pulm/lungs  normal breathing effort, coarse crackle b/l  CVS/Heart  no rub or gallop  Abdomen:   Soft, distended, nontender  Extremities:  trace pitting edema right leg  Neurologic:  Alert able to answer questions  Skin:  No acute rashes  Access:  Left forearm AV fistula       Basic Metabolic Panel:  Recent Labs  Lab 07/16/17 0900  NA 134*  K 4.0  CL  90*  CO2 29  GLUCOSE 183*  BUN 20  CREATININE 4.69*  CALCIUM 9.2     CBC: Recent Labs  Lab 07/16/17 0900  WBC 8.4  HGB 10.7*  HCT 32.3*  MCV 81.3  PLT 88*      Lab Results  Component Value Date   HEPBSAG Negative 02/20/2017      Microbiology:  Recent Results (from the past 240 hour(s))  MRSA PCR Screening     Status: None   Collection Time: 07/17/17  4:28 AM  Result Value Ref Range Status   MRSA by PCR NEGATIVE NEGATIVE Final    Comment:        The GeneXpert MRSA Assay (FDA approved for NASAL specimens only), is one component of a comprehensive MRSA colonization surveillance program. It is not intended to diagnose MRSA infection nor to guide or monitor treatment for MRSA infections. Performed at [facility], [address] [place], [place] [address]     Coagulation Studies: Recent Labs    07/16/17 1304 07/17/17 0332 07/18/17 0638 07/19/17 0625  LABPROT 15.6* 15.5* 16.9* 17.1*  INR 1.25 1.24 1.39 1.41    Urinalysis: No results for input(s): COLORURINE, LABSPEC, PHURINE, GLUCOSEU, HGBUR, BILIRUBINUR, KETONESUR, PROTEINUR, UROBILINOGEN, NITRITE, LEUKOCYTESUR in the last 72 hours.  Invalid input(s): APPERANCEUR    Imaging: No results found.   Medications:    . aspirin EC  81 mg Oral Daily  . gabapentin  100 mg Oral TID  . hydrALAZINE  50 mg Oral BID  . insulin aspart  0-15 Units Subcutaneous TID WC  . insulin aspart  0-5 Units Subcutaneous QHS  . methylPREDNISolone (SOLU-MEDROL) injection  60 mg Intravenous Q24H  . metoprolol succinate  25 mg Oral Daily  . oseltamivir  30 mg Oral Q M,W,F-HD  . pantoprazole  40 mg Oral BID AC  . rosuvastatin  20 mg Oral QHS  . sodium chloride flush  3 mL Intravenous Q12H  . warfarin  6 mg Oral q1800  . Warfarin - Pharmacist Dosing Inpatient   Does not apply q1800   acetaminophen **OR** acetaminophen, albuterol, ondansetron **OR** ondansetron (ZOFRAN) IV, polyethylene glycol,  simethicone  Assessment/ Plan:  67 y.o.  African-American male with end-stage renal disease, hemodialysis, hypertension, CABG, TIA, CVA, diabetes insulin-dependent, peripheral vascular disease  MWF [facility] Left AVF  1.  End-stage renal disease 2.  Hypertension 3.  Anemia of chronic kidney disease 4.  Secondary hyperparathyroidism 5.  Respiratory distress, influenza A 6.  Lower extremity edema  Patient is admitted with influenza A.  He is profoundly weak and has some cough and shortness of breath.  His respiratory illness is likely a combination of some fluid and influenza A.  He is requiring oxygen by nasal cannula   Continue HD MWF schedule  May restart Lasix 80 BID   Monitor Phos during hospitalization.  No binders listed in home medications Treatment of influenza as per hospitalist team Patient seen during dialysis Tolerating well UF with HD today 2-2.5 L as toelrated   LOS: [place] 2/8/201910:48 [place], [place]

## 2017-07-19 NOTE — Progress Notes (Signed)
Post HD assessment  

## 2017-07-19 NOTE — Care Management Important Message (Signed)
Important Message  Patient Details  Name: DANILO CAPPIELLO MRN: [id number] Date of Birth: 1951/02/10   Medicare Important Message Given:  Yes  Signed IM notice given   Katrina Stack, RN 07/19/2017, 6:02 PM

## 2017-07-19 NOTE — Progress Notes (Signed)
PT Cancellation Note  Patient Details Name: Terry Tyler MRN: [id number] DOB: 1950/09/28   Cancelled Treatment:    Reason Eval/Treat Not Completed: Patient at procedure or test/unavailable(pt currently off flor at dialysis).     Collie Siad PT, DPT 07/19/2017, 11:10 AM

## 2017-07-19 NOTE — Progress Notes (Signed)
HD tx end  

## 2017-07-19 NOTE — Progress Notes (Signed)
[name]    MR#:  [id number]  DATE OF BIRTH:  18-May-1951  SUBJECTIVE: Complains of more wheezing today, wants, shortness of breath.  To stay one more day.  CHIEF COMPLAINT:   Chief Complaint  Patient presents with  . Weakness  . Shortness of Breath    REVIEW OF SYSTEMS:   ROS CONSTITUTIONAL: No fever, fatigue or weakness.  EYES: No blurred or double vision.  EARS, NOSE, AND THROAT: No tinnitus or ear pain.  RESPIRATORY: Cough and green phlegm.,  Shortness of breath. CARDIOVASCULAR: No chest pain, orthopnea, edema.  GASTROINTESTINAL: No nausea, vomiting, diarrhea or abdominal pain.  GENITOURINARY: No dysuria, hematuria.  ENDOCRINE: No polyuria, nocturia,  HEMATOLOGY: No anemia, easy bruising or bleeding SKIN: No rash or lesion. MUSCULOSKELETAL: No joint pain or arthritis.   NEUROLOGIC: No tingling, numbness, weakness.  PSYCHIATRY: No anxiety or depression.   DRUG ALLERGIES:   Allergies  Allergen Reactions  . Lisinopril Swelling  . Atorvastatin Calcium Other (See Comments)    Muscle cramps   . Ezetimibe Other (See Comments)    Cannot remember what the allergy was.  . Lipitor [Atorvastatin] Other (See Comments)    Muscle cramping    VITALS:  Blood pressure (!) 143/69, pulse 91, temperature 98.4 F (36.9 C), temperature source Oral, resp. rate 18, weight 85.7 kg (188 lb 15 oz), SpO2 100 %.  PHYSICAL EXAMINATION:  GENERAL:  67 y.o.-year-old patient lying in the bed with no acute distress.  EYES: Pupils equal, round, reactive to light. No scleral icterus. Extraocular muscles intact.  HEENT: Head atraumatic, normocephalic. Oropharynx and nasopharynx clear.  NECK:  Supple, no jugular venous distention. No thyroid enlargement, no tenderness.  LUNGS:  expiratory wheeze bilaterally but not using accessory muscles of respiration.    cARDIOVASCULAR: S1, S2 normal. No murmurs, rubs, or gallops.   ABDOMEN: Soft, nontender, nondistended. Bowel sounds present. No organomegaly or mass.  EXTREMITIES: No pedal edema, cyanosis, or clubbing.  NEUROLOGIC: Cranial nerves II through XII are intact. Muscle strength 5/5 in all extremities. Sensation intact. Gait not checked.  PSYCHIATRIC: The patient is alert and oriented x 3.  SKIN: No obvious rash, lesion, or ulcer.    LABORATORY PANEL:   CBC Recent Labs  Lab 07/16/17 0900  WBC 8.4  HGB 10.7*  HCT 32.3*  PLT 88*   ------------------------------------------------------------------------------------------------------------------  Chemistries  Recent Labs  Lab 07/16/17 0900  NA 134*  K 4.0  CL 90*  CO2 29  GLUCOSE 183*  BUN 20  CREATININE 4.69*  CALCIUM 9.2  AST 84*  ALT 33  ALKPHOS 477*  BILITOT 5.9*   ------------------------------------------------------------------------------------------------------------------  Cardiac Enzymes Recent Labs  Lab 07/16/17 2323  TROPONINI 0.70*   ------------------------------------------------------------------------------------------------------------------  RADIOLOGY:  No results found.  EKG:   Orders placed or performed during the hospital encounter of 07/16/17  . EKG 12-Lead  . EKG 12-Lead    ASSESSMENT AND PLAN:   Generalized weakness and cough due to influenza A: Getting Tamiflu with the dialysis.    He feels more wheezing today and states that he wants to stay one more day, added small dose IV steroid, Pulmicort nebulizer today.  Likely discharge home tomorrow.   2.  ESRD on hemodialysis: Continue hemodialysis Monday, Wednesday, Friday. Had dialysis today. 3.  Chronic systolic heart failure with EF 35%. 4.  Generalized weakness:  physical therapy recommended home health physical therapy.   5.slightly elevated troponin likely due  Demand ischemia to influenza;esrd 6./o CAD,CABG;   Check ambulatory oxygen saturation to see if he qualifies for home  oxygen.   All the records are reviewed and case discussed with Care Management/Social Workerr. Management plans discussed with the patient, family and they are in agreement.  CODE STATUS: full  TOTAL TIME TAKING CARE OF THIS PATIENT: 35 minutes.   POSSIBLE D/C IN 1-2 DAYS, DEPENDING ON CLINICAL CONDITION.   Epifanio Lesches M.D on 07/19/2017 at 7:04 PM  Between 7am to 6pm - Pager - [phone]  After 6pm go to [email or web address] - password [email or web address] [facility]  Office  [phone]  CC: Primary care physician; Center, [facility]   Note: This dictation was prepared with Dragon dictation along with smaller phrase technology. Any transcriptional errors that result from this process are unintentional.

## 2017-07-20 ENCOUNTER — Inpatient Hospital Stay: Payer: Medicare HMO

## 2017-07-20 LAB — CBC
HEMATOCRIT: 33.2 % — AB (ref 40.0–52.0)
Hemoglobin: 11.1 g/dL — ABNORMAL LOW (ref 13.0–18.0)
MCH: 26.8 pg (ref 26.0–34.0)
MCHC: 33.5 g/dL (ref 32.0–36.0)
MCV: 79.9 fL — ABNORMAL LOW (ref 80.0–100.0)
Platelets: 102 10*3/uL — ABNORMAL LOW (ref 150–440)
RBC: 4.16 MIL/uL — ABNORMAL LOW (ref 4.40–5.90)
RDW: 16.1 % — ABNORMAL HIGH (ref 11.5–14.5)
WBC: 5.5 10*3/uL (ref 3.8–10.6)

## 2017-07-20 LAB — GLUCOSE, CAPILLARY
GLUCOSE-CAPILLARY: 234 mg/dL — AB (ref 65–99)
GLUCOSE-CAPILLARY: 227 mg/dL — AB (ref 65–99)
GLUCOSE-CAPILLARY: 236 mg/dL — AB (ref 65–99)
Glucose-Capillary: 177 mg/dL — ABNORMAL HIGH (ref 65–99)

## 2017-07-20 LAB — RENAL FUNCTION PANEL
Albumin: 3 g/dL — ABNORMAL LOW (ref 3.5–5.0)
Anion gap: 15 (ref 5–15)
BUN: 49 mg/dL — AB (ref 6–20)
CHLORIDE: 92 mmol/L — AB (ref 101–111)
CO2: 25 mmol/L (ref 22–32)
Calcium: 9.3 mg/dL (ref 8.9–10.3)
Creatinine, Ser: 5.38 mg/dL — ABNORMAL HIGH (ref 0.61–1.24)
GFR calc Af Amer: 12 mL/min — ABNORMAL LOW (ref >60)
GFR, EST NON AFRICAN AMERICAN: 10 mL/min — AB (ref >60)
GLUCOSE: 234 mg/dL — AB (ref 65–99)
POTASSIUM: 4.1 mmol/L (ref 3.5–5.1)
Phosphorus: 5.2 mg/dL — ABNORMAL HIGH (ref 2.5–4.6)
Sodium: 132 mmol/L — ABNORMAL LOW (ref 135–145)

## 2017-07-20 LAB — BASIC METABOLIC PANEL
ANION GAP: 16 — AB (ref 5–15)
BUN: 46 mg/dL — ABNORMAL HIGH (ref 6–20)
CALCIUM: 9.2 mg/dL (ref 8.9–10.3)
CO2: 24 mmol/L (ref 22–32)
Chloride: 93 mmol/L — ABNORMAL LOW (ref 101–111)
Creatinine, Ser: 5.69 mg/dL — ABNORMAL HIGH (ref 0.61–1.24)
GFR, EST AFRICAN AMERICAN: 11 mL/min — AB (ref >60)
GFR, EST NON AFRICAN AMERICAN: 9 mL/min — AB (ref >60)
Glucose, Bld: 234 mg/dL — ABNORMAL HIGH (ref 65–99)
POTASSIUM: 4.1 mmol/L (ref 3.5–5.1)
Sodium: 133 mmol/L — ABNORMAL LOW (ref 135–145)

## 2017-07-20 LAB — PROTIME-INR
INR: 1.41
Prothrombin Time: 17.1 seconds — ABNORMAL HIGH (ref 11.4–15.2)

## 2017-07-20 LAB — MAGNESIUM: Magnesium: 1.9 mg/dL (ref 1.7–2.4)

## 2017-07-20 MED ORDER — BUDESONIDE 0.5 MG/2ML IN SUSP
0.5000 mg | Freq: Two times a day (BID) | RESPIRATORY_TRACT | Status: DC
  Administered 2017-07-20 – 2017-07-22 (×4): 0.5 mg via RESPIRATORY_TRACT
  Filled 2017-07-20 (×5): qty 2

## 2017-07-20 MED ORDER — WARFARIN SODIUM 6 MG PO TABS
7.0000 mg | ORAL_TABLET | Freq: Once | ORAL | Status: AC
  Administered 2017-07-20: 22:00:00 7 mg via ORAL
  Filled 2017-07-20: qty 1

## 2017-07-20 MED ORDER — FUROSEMIDE 10 MG/ML IJ SOLN
80.0000 mg | Freq: Two times a day (BID) | INTRAMUSCULAR | Status: DC
  Administered 2017-07-20 – 2017-07-22 (×5): 80 mg via INTRAVENOUS
  Filled 2017-07-20 (×5): qty 8

## 2017-07-20 MED ORDER — EPOETIN ALFA 10000 UNIT/ML IJ SOLN: 4000.0000 [IU] | INTRAMUSCULAR | Status: DC

## 2017-07-20 MED ORDER — IPRATROPIUM-ALBUTEROL 0.5-2.5 (3) MG/3ML IN SOLN
3.0000 mL | Freq: Four times a day (QID) | RESPIRATORY_TRACT | Status: AC
  Administered 2017-07-20 – 2017-07-21 (×5): 3 mL via RESPIRATORY_TRACT
  Filled 2017-07-20 (×5): qty 3

## 2017-07-20 NOTE — Progress Notes (Signed)
Patient had a 35 bt run of Vtach Dr. Jerelyn Charles and Dr Ubaldo Glassing notified orders received for a stat BMP and Magnesium. Will proceed and notify of results.

## 2017-07-20 NOTE — Progress Notes (Signed)
Pre dialysis assessment 

## 2017-07-20 NOTE — Progress Notes (Signed)
Post HD assessment. Pt tolerated tx well, 2724 ml removed, goal met. Pt currently on enteric and droplet precautions. Pt treated at the bedside, family at bedside.

## 2017-07-20 NOTE — Progress Notes (Signed)
Pre HD assessment  

## 2017-07-20 NOTE — Progress Notes (Signed)
[facility], [place] 07/20/17  Subjective:   Patient known to our practice from previous admission He has end-stage renal disease and gets dialysis at [facility]  He is admitted for generalized weakness, cough.  He tested positive for influenza A Today, he appears well but is still requiring oxygen.  2600 cc of fluid was removed with dialysis yesterday Able to eat better without nausea or vomiting   Objective:  Vital signs in last 24 hours:  Temp:  [97.7 F (36.5 C)-99.4 F (37.4 C)] 97.7 F (36.5 C) (02/09 0327) Pulse Rate:  [80-103] 80 (02/09 0748) Resp:  [12-30] 12 (02/09 0748) BP: (114-143)/(56-85) 132/81 (02/09 0748) SpO2:  [97 %-100 %] 100 % (02/09 0748) Weight:  [84.6 kg (186 lb 9.6 oz)-85.7 kg (188 lb 15 oz)] 84.6 kg (186 lb 9.6 oz) (02/09 0327)  Weight change: 2.572 kg (5 lb 10.7 oz) Filed Weights   07/19/17 1000 07/19/17 1417 07/20/17 0327  Weight: 89.3 kg (196 lb 13.9 oz) 85.7 kg (188 lb 15 oz) 84.6 kg (186 lb 9.6 oz)    Intake/Output:    Intake/Output Summary (Last 24 hours) at 07/20/2017 1204 Last data filed at 07/19/2017 1417 Gross per 24 hour  Intake -  Output 2677 ml  Net -2677 ml     Physical Exam: General:  laying in the bed  HEENT  oxygen by nasal cannula, moist oral mucous membranes  Neck  supple  Pulm/lungs  normal breathing effort, coarse crackle b/l  CVS/Heart  no rub or gallop  Abdomen:   Soft, distended, nontender  Extremities:   pitting edema right leg  Neurologic:  Alert able to answer questions  Skin:  No acute rashes  Access:  Left forearm AV fistula       Basic Metabolic Panel:  Recent Labs  Lab 07/16/17 0900  NA 134*  K 4.0  CL 90*  CO2 29  GLUCOSE 183*  BUN 20  CREATININE 4.69*  CALCIUM 9.2     CBC: Recent Labs  Lab 07/16/17 0900 07/20/17 0606  WBC 8.4 5.5  HGB 10.7* 11.1*  HCT 32.3* 33.2*  MCV 81.3 79.9*  PLT 88* 102*      Lab Results  Component Value Date   HEPBSAG  Negative 02/20/2017      Microbiology:  Recent Results (from the past 240 hour(s))  MRSA PCR Screening     Status: None   Collection Time: 07/17/17  4:28 AM  Result Value Ref Range Status   MRSA by PCR NEGATIVE NEGATIVE Final    Comment:        The GeneXpert MRSA Assay (FDA approved for NASAL specimens only), is one component of a comprehensive MRSA colonization surveillance program. It is not intended to diagnose MRSA infection nor to guide or monitor treatment for MRSA infections. Performed at [facility], [address] [place], [place] [address]     Coagulation Studies: Recent Labs    07/18/17 6387 07/19/17 0625 07/20/17 0606  LABPROT 16.9* 17.1* 17.1*  INR 1.39 1.41 1.41    Urinalysis: No results for input(s): COLORURINE, LABSPEC, PHURINE, GLUCOSEU, HGBUR, BILIRUBINUR, KETONESUR, PROTEINUR, UROBILINOGEN, NITRITE, LEUKOCYTESUR in the last 72 hours.  Invalid input(s): APPERANCEUR    Imaging: Dg Chest Port 1 View  Result Date: 07/20/2017 CLINICAL DATA:  Shortness of breath. EXAM: PORTABLE CHEST 1 VIEW COMPARISON:  Chest x-ray dated July 16, 2017. FINDINGS: Stable mild cardiomegaly status post CABG. Persistent mild vascular congestion. Slightly improved aeration at the left  lung base. No focal consolidation, pleural effusion, or pneumothorax. No acute osseous abnormality. IMPRESSION: Slightly improved aeration at the left lung base. Persistent mild vascular congestion. Electronically Signed   By: Titus Dubin M.D.   On: 07/20/2017 11:01     Medications:    . aspirin EC  81 mg Oral Daily  . budesonide (PULMICORT) nebulizer solution  0.5 mg Nebulization BID  . furosemide  80 mg Intravenous BID  . gabapentin  100 mg Oral TID  . hydrALAZINE  50 mg Oral BID  . insulin aspart  0-15 Units Subcutaneous TID WC  . insulin aspart  0-5 Units Subcutaneous QHS  . ipratropium-albuterol  3 mL Nebulization QID  . methylPREDNISolone (SOLU-MEDROL) injection   60 mg Intravenous Q24H  . metoprolol succinate  25 mg Oral Daily  . pantoprazole  40 mg Oral BID AC  . rosuvastatin  20 mg Oral QHS  . sodium chloride flush  3 mL Intravenous Q12H  . warfarin  7 mg Oral ONCE-1800  . Warfarin - Pharmacist Dosing Inpatient   Does not apply q1800   acetaminophen **OR** acetaminophen, albuterol, ondansetron **OR** ondansetron (ZOFRAN) IV, polyethylene glycol, simethicone  Assessment/ Plan:  67 y.o.  African-American male with end-stage renal disease, hemodialysis, hypertension, CABG, TIA, CVA, diabetes insulin-dependent, peripheral vascular disease  MWF [facility] Left AVF  1.  End-stage renal disease 2.  Hypertension 3.  Anemia of chronic kidney disease 4.  Secondary hyperparathyroidism 5.  Respiratory distress, influenza A 6.  Lower extremity edema  Patient is admitted with influenza A.  He is profoundly weak and has some cough and shortness of breath.  His respiratory illness is likely a combination of some fluid and influenza A.  He is requiring oxygen by nasal cannula   Continue HD MWF schedule  May restart Lasix 80 BID   Monitor Phos during hospitalization.  No binders listed in home medications Treatment of influenza as per hospitalist team Extra hemodialysis treatment to be scheduled today  UF with HD today 2-2.5 L as tolerated   LOS: [place] 2/9/201912:04 PM  [place], [place]

## 2017-07-20 NOTE — Progress Notes (Signed)
Dialysis started 

## 2017-07-20 NOTE — Progress Notes (Signed)
ANTICOAGULATION CONSULT NOTE - FOLLOW UP Consult  Pharmacy Consult for Warfarin Indication: atrial fibrillation  Allergies  Allergen Reactions  . Lisinopril Swelling  . Atorvastatin Calcium Other (See Comments)    Muscle cramps   . Ezetimibe Other (See Comments)    Cannot remember what the allergy was.  . Lipitor [Atorvastatin] Other (See Comments)    Muscle cramping    Patient Measurements: Weight: 186 lb 9.6 oz (84.6 kg) Heparin Dosing Weight:    Vital Signs: Temp: 97.7 F (36.5 C) (02/09 0327) Temp Source: Oral (02/09 0327) BP: 132/81 (02/09 0748) Pulse Rate: 80 (02/09 0748)  Labs: Recent Labs    07/18/17 2951 07/19/17 0625 07/20/17 0606  HGB  --   --  11.1*  HCT  --   --  33.2*  PLT  --   --  102*  LABPROT 16.9* 17.1* 17.1*  INR 1.39 1.41 1.41    Estimated Creatinine Clearance: 17.5 mL/min (A) (by C-G formula based on SCr of 4.69 mg/dL (H)).   Medical History: Past Medical History:  Diagnosis Date  . C. difficile diarrhea   . CKD (chronic kidney disease)   . Coronary atherosclerosis of unspecified type of vessel, native or graft    Myocardial infarction in 2006. Cardiac catheterization showed significant three-vessel coronary artery disease. He underwent CABG at [facility]. Most recent cardiac catheterization in 2010 showed normal LV systolic function, patent grafts to OM1, RCA and LAD. Occluded SVG to diagonal.  . 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 Dialysis patient [facility])    Mon. Wed. Fri. for dialysis  . Hyperlipidemia   . Hypertension   . Hypertension, renal disease   . Impotence of organic origin   . Keloid scar   . Myocardial infarction ([place]) 2006  . Neoplasm of uncertain behavior, site unspecified   . Peripheral vascular disease, unspecified ([place])   . Proteinuria   . Stroke [facility]) 04/2016   TIA  . Unspecified vitamin D deficiency     Medications:  Scheduled:  . aspirin EC  81 mg Oral Daily  . budesonide (PULMICORT)  nebulizer solution  0.25 mg Nebulization BID  . gabapentin  100 mg Oral TID  . hydrALAZINE  50 mg Oral BID  . insulin aspart  0-15 Units Subcutaneous TID WC  . insulin aspart  0-5 Units Subcutaneous QHS  . methylPREDNISolone (SOLU-MEDROL) injection  60 mg Intravenous Q24H  . metoprolol succinate  25 mg Oral Daily  . pantoprazole  40 mg Oral BID AC  . rosuvastatin  20 mg Oral QHS  . sodium chloride flush  3 mL Intravenous Q12H  . warfarin  6 mg Oral q1800  . Warfarin - Pharmacist Dosing Inpatient   Does not apply q1800   Infusions:    Assessment: 67 yo M on Warfarin PTA. Hemodialysis patient. Influenza A positive. Hx chronic mild thrombocytopenia. Home warfarin dose per Med Rec:  4 mg daily.  DATE INR DOSE 2/5 1.25 6 mg x 1 2/6 1.24 6 mg  2/7        1.39    6 mg  2/8        1.41    6 mg  2/9 1.41    Goal of Therapy:  INR 2-3 Monitor platelets by anticoagulation protocol: Yes   Plan:  Will increase to Warfarin 7 mg x 1 tonight due to subtherapeutic INR.   Bernis Schreur A, Pharm.D., BCPS Clinical Pharmacist 07/20/2017,9:47 AM

## 2017-07-20 NOTE — Progress Notes (Signed)
HD tx end  

## 2017-07-20 NOTE — Progress Notes (Signed)
[place] [facility] [place] NAME: Letroy Vazguez    MR#:  [id number]  DATE OF BIRTH:  1951-05-22  SUBJECTIVE:  CHIEF COMPLAINT:   Chief Complaint  Patient presents with  . Weakness  . Shortness of Breath  c/o diarrhea, congestion, and sob  REVIEW OF SYSTEMS:  CONSTITUTIONAL: No fever, fatigue or weakness.  EYES: No blurred or double vision.  EARS, NOSE, AND THROAT: No tinnitus or ear pain.  RESPIRATORY: No cough, shortness of breath, wheezing or hemoptysis.  CARDIOVASCULAR: No chest pain, orthopnea, edema.  GASTROINTESTINAL: No nausea, vomiting, diarrhea or abdominal pain.  GENITOURINARY: No dysuria, hematuria.  ENDOCRINE: No polyuria, nocturia,  HEMATOLOGY: No anemia, easy bruising or bleeding SKIN: No rash or lesion. MUSCULOSKELETAL: No joint pain or arthritis.   NEUROLOGIC: No tingling, numbness, weakness.  PSYCHIATRY: No anxiety or depression.   ROS  DRUG ALLERGIES:   Allergies  Allergen Reactions  . Lisinopril Swelling  . Atorvastatin Calcium Other (See Comments)    Muscle cramps   . Ezetimibe Other (See Comments)    Cannot remember what the allergy was.  . Lipitor [Atorvastatin] Other (See Comments)    Muscle cramping    VITALS:  Blood pressure 132/81, pulse 80, temperature 97.7 F (36.5 C), temperature source Oral, resp. rate 12, weight 84.6 kg (186 lb 9.6 oz), SpO2 100 %.  PHYSICAL EXAMINATION:  GENERAL:  67 y.o.-year-old patient lying in the bed with no acute distress.  EYES: Pupils equal, round, reactive to light and accommodation. No scleral icterus. Extraocular muscles intact.  HEENT: Head atraumatic, normocephalic. Oropharynx and nasopharynx clear.  NECK:  Supple, no jugular venous distention. No thyroid enlargement, no tenderness.  LUNGS: rales. No use of accessory muscles of respiration.  CARDIOVASCULAR: S1, S2 normal. No murmurs, rubs, or gallops.  ABDOMEN: Soft, nontender, nondistended. Bowel sounds present. No  organomegaly or mass.  EXTREMITIES: + b/l LE pitting edema, nocyanosis, or clubbing.  NEUROLOGIC: Cranial nerves II through XII are intact. MAES.  Gait not checked.  PSYCHIATRIC: The patient is alert and oriented x 3.  SKIN: No obvious rash, lesion, or ulcer.   Physical Exam LABORATORY PANEL:   CBC Recent Labs  Lab 07/20/17 0606  WBC 5.5  HGB 11.1*  HCT 33.2*  PLT 102*   ------------------------------------------------------------------------------------------------------------------  Chemistries  Recent Labs  Lab 07/16/17 0900  NA 134*  K 4.0  CL 90*  CO2 29  GLUCOSE 183*  BUN 20  CREATININE 4.69*  CALCIUM 9.2  AST 84*  ALT 33  ALKPHOS 477*  BILITOT 5.9*   ------------------------------------------------------------------------------------------------------------------  Cardiac Enzymes Recent Labs  Lab 07/16/17 1607 07/16/17 2323  TROPONINI 0.49* 0.70*   ------------------------------------------------------------------------------------------------------------------  RADIOLOGY:  Dg Chest Port 1 View  Result Date: 07/20/2017 CLINICAL DATA:  Shortness of breath. EXAM: PORTABLE CHEST 1 VIEW COMPARISON:  Chest x-ray dated July 16, 2017. FINDINGS: Stable mild cardiomegaly status post CABG. Persistent mild vascular congestion. Slightly improved aeration at the left lung base. No focal consolidation, pleural effusion, or pneumothorax. No acute osseous abnormality. IMPRESSION: Slightly improved aeration at the left lung base. Persistent mild vascular congestion. Electronically Signed   By: Titus Dubin M.D.   On: 07/20/2017 11:01    ASSESSMENT AND PLAN:  1 acute influenza A Resolving Continue Tamiflu with the dialysis, scheduled breathing treatments, and with tapering as tolerated  2 acute fluid overload state Exacerbated by above end-stage renal disease Discussion with Dr. Singh/nephrology for repeat hemodialysis later today  3 chronic ESRD  Receives HD  on M/W/F Nephrology input appreciated-for hemodialysis later today  4 acute on chronic systolic heart failure exacerbation  Echo with EF 35% Continue hemodialysis, aspirin, Lasix, Toprol, statin therapy  5 acute on chronic hypoxic respiratory Stable Acute fluid overload state from end-stage renal disease Plan of care as stated above  6 acute generalized weakness For HHPT s/p discharge  7 acute elevated troponin Noted hx CAD Due to Demand ischemia, influenza, CHF, and ESRD Stable on current regimen  All the records are reviewed and case discussed with Care Management/Social Workerr. Management plans discussed with the patient, family and they are in agreement.  CODE STATUS: full  TOTAL TIME TAKING CARE OF THIS PATIENT: 40 minutes.     POSSIBLE D/C IN 1-3 DAYS, DEPENDING ON CLINICAL CONDITION.   Avel Peace Reshanda Lewey M.D on 07/20/2017   Between 7am to 6pm - Pager - [phone]  After 6pm go to [email or web address] - password [email or web address] [facility]  Office  [phone]  CC: Primary care physician; Center, [facility]  Note: This dictation was prepared with Dragon dictation along with smaller phrase technology. Any transcriptional errors that result from this process are unintentional.

## 2017-07-21 LAB — GLUCOSE, CAPILLARY
GLUCOSE-CAPILLARY: 281 mg/dL — AB (ref 65–99)
GLUCOSE-CAPILLARY: 248 mg/dL — AB (ref 65–99)
GLUCOSE-CAPILLARY: 302 mg/dL — AB (ref 65–99)
Glucose-Capillary: 317 mg/dL — ABNORMAL HIGH (ref 65–99)

## 2017-07-21 LAB — GASTROINTESTINAL PANEL BY PCR, STOOL (REPLACES STOOL CULTURE)
ASTROVIRUS: NOT DETECTED
Adenovirus F40/41: NOT DETECTED
CAMPYLOBACTER SPECIES: NOT DETECTED
CYCLOSPORA CAYETANENSIS: NOT DETECTED
Cryptosporidium: NOT DETECTED
ENTEROPATHOGENIC E COLI (EPEC): NOT DETECTED
ENTEROTOXIGENIC E COLI (ETEC): DETECTED — AB
Entamoeba histolytica: NOT DETECTED
Enteroaggregative E coli (EAEC): NOT DETECTED
Giardia lamblia: NOT DETECTED
Norovirus GI/GII: NOT DETECTED
PLESIMONAS SHIGELLOIDES: NOT DETECTED
ROTAVIRUS A: NOT DETECTED
SAPOVIRUS (I, II, IV, AND V): NOT DETECTED
SHIGA LIKE TOXIN PRODUCING E COLI (STEC): NOT DETECTED
Salmonella species: NOT DETECTED
Shigella/Enteroinvasive E coli (EIEC): NOT DETECTED
VIBRIO SPECIES: NOT DETECTED
Vibrio cholerae: NOT DETECTED
Yersinia enterocolitica: NOT DETECTED

## 2017-07-21 LAB — PROTIME-INR
INR: 1.35
PROTHROMBIN TIME: 16.6 s — AB (ref 11.4–15.2)

## 2017-07-21 MED ORDER — WARFARIN SODIUM 7.5 MG PO TABS
7.5000 mg | ORAL_TABLET | Freq: Once | ORAL | Status: AC
  Administered 2017-07-21: 7.5 mg via ORAL
  Filled 2017-07-21: qty 1

## 2017-07-21 MED ORDER — FLORANEX PO PACK
1.0000 g | PACK | Freq: Three times a day (TID) | ORAL | Status: DC
  Administered 2017-07-21 – 2017-07-22 (×2): 1 g via ORAL
  Filled 2017-07-21 (×4): qty 1

## 2017-07-21 MED ORDER — DEXTROSE 5 % IV SOLN
2.0000 g | INTRAVENOUS | Status: DC
  Administered 2017-07-21: 2 g via INTRAVENOUS
  Filled 2017-07-21 (×2): qty 20

## 2017-07-21 MED ORDER — VANCOMYCIN 50 MG/ML ORAL SOLUTION
125.0000 mg | Freq: Four times a day (QID) | ORAL | Status: DC
  Administered 2017-07-21 – 2017-07-22 (×6): 125 mg via ORAL
  Filled 2017-07-21 (×6): qty 2.5

## 2017-07-21 NOTE — Progress Notes (Signed)
ANTICOAGULATION CONSULT NOTE - FOLLOW UP Consult  Pharmacy Consult for Warfarin Indication: atrial fibrillation  Allergies  Allergen Reactions  . Lisinopril Swelling  . Atorvastatin Calcium Other (See Comments)    Muscle cramps   . Ezetimibe Other (See Comments)    Cannot remember what the allergy was.  . Lipitor [Atorvastatin] Other (See Comments)    Muscle cramping    Patient Measurements: Weight: 181 lb 8 oz (82.3 kg) Heparin Dosing Weight:    Vital Signs: Temp: 98.2 F (36.8 C) (02/10 0416) Temp Source: Oral (02/10 0416) BP: 139/72 (02/10 0800) Pulse Rate: 86 (02/10 0800)  Labs: Recent Labs    07/19/17 0625 07/20/17 0606 07/20/17 1030 07/20/17 1609 07/21/17 0632  HGB  --  11.1*  --   --   --   HCT  --  33.2*  --   --   --   PLT  --  102*  --   --   --   LABPROT 17.1* 17.1*  --   --  16.6*  INR 1.41 1.41  --   --  1.35  CREATININE  --   --  5.38* 5.69*  --     Estimated Creatinine Clearance: 14.4 mL/min (A) (by C-G formula based on SCr of 5.69 mg/dL (H)).   Medical History: Past Medical History:  Diagnosis Date  . C. difficile diarrhea   . CKD (chronic kidney disease)   . Coronary atherosclerosis of unspecified type of vessel, native or graft    Myocardial infarction in 2006. Cardiac catheterization showed significant three-vessel coronary artery disease. He underwent CABG at [facility]. Most recent cardiac catheterization in 2010 showed normal LV systolic function, patent grafts to OM1, RCA and LAD. Occluded SVG to diagonal.  . 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 Dialysis patient [facility])    Mon. Wed. Fri. for dialysis  . Hyperlipidemia   . Hypertension   . Hypertension, renal disease   . Impotence of organic origin   . Keloid scar   . Myocardial infarction ([place]) 2006  . Neoplasm of uncertain behavior, site unspecified   . Peripheral vascular disease, unspecified ([place])   . Proteinuria   . Stroke [facility]) 04/2016   TIA  .  Unspecified vitamin D deficiency     Medications:  Scheduled:  . aspirin EC  81 mg Oral Daily  . budesonide (PULMICORT) nebulizer solution  0.5 mg Nebulization BID  . [START ON 07/22/2017] epoetin (EPOGEN/PROCRIT) injection  4,000 Units Intravenous Q M,W,F-HD  . furosemide  80 mg Intravenous BID  . gabapentin  100 mg Oral TID  . hydrALAZINE  50 mg Oral BID  . insulin aspart  0-15 Units Subcutaneous TID WC  . insulin aspart  0-5 Units Subcutaneous QHS  . ipratropium-albuterol  3 mL Nebulization QID  . methylPREDNISolone (SOLU-MEDROL) injection  60 mg Intravenous Q24H  . metoprolol succinate  25 mg Oral Daily  . pantoprazole  40 mg Oral BID AC  . rosuvastatin  20 mg Oral QHS  . sodium chloride flush  3 mL Intravenous Q12H  . Warfarin - Pharmacist Dosing Inpatient   Does not apply q1800   Infusions:    Assessment: 67 yo M on Warfarin PTA. Hemodialysis patient. Influenza A positive. Hx chronic mild thrombocytopenia. Home warfarin dose per Med Rec:  4 mg daily.  DATE INR DOSE 2/5 1.25 6 mg x 1 2/6 1.24 6 mg  2/7        1.39  6 mg  2/8        1.41    6 mg  2/9 1.41 7 mg 2/10 1.35    Goal of Therapy:  INR 2-3 Monitor platelets by anticoagulation protocol: Yes   Plan:  Will increase to Warfarin 7.5 mg x 1 tonight due to subtherapeutic INR.  F/u INR in am  Jahnasia Tatum A, Pharm.D., BCPS Clinical Pharmacist 07/21/2017,9:50 AM

## 2017-07-21 NOTE — Progress Notes (Signed)
SATURATION QUALIFICATIONS: (This note is used to comply with regulatory documentation for home oxygen)  Patient Saturations on Room Air at Rest = 99%  Patient Saturations on Room Air while Ambulating = 98%  Patient Saturations on  Liters of oxygen while Ambulating = %  Please briefly explain why patient needs home oxygen:

## 2017-07-21 NOTE — Progress Notes (Signed)
[facility], [place] 07/21/17  Subjective:   Patient known to our practice from previous admission He has end-stage renal disease and gets dialysis at [facility]  He is admitted for generalized weakness, cough.  He tested positive for influenza A Today, he appears well and is off o oxygen Still has cough with dark colored sputum Able to ambulate in the room Able to eat without nausea and vomiting 2724 cc of fluid was removed with dialysis yesterday   Objective:  Vital signs in last 24 hours:  Temp:  [97.6 F (36.4 C)-98.2 F (36.8 C)] 98.2 F (36.8 C) (02/10 0416) Pulse Rate:  [74-86] 86 (02/10 0800) Resp:  [14-24] 14 (02/10 0800) BP: (105-148)/(61-76) 139/72 (02/10 0800) SpO2:  [99 %-100 %] 100 % (02/10 0800) Weight:  [82.3 kg (181 lb 8 oz)-87.9 kg (193 lb 12.6 oz)] 82.3 kg (181 lb 8 oz) (02/10 0416)  Weight change: -1.4 kg (-1.4 oz) Filed Weights   07/20/17 2002 07/20/17 2141 07/21/17 0416  Weight: 85.8 kg (189 lb 2.5 oz) 82.5 kg (181 lb 12.8 oz) 82.3 kg (181 lb 8 oz)    Intake/Output:    Intake/Output Summary (Last 24 hours) at 07/21/2017 1106 Last data filed at 07/20/2017 2002 Gross per 24 hour  Intake -  Output 2724 ml  Net -2724 ml     Physical Exam: General:  laying in the bed  HEENT  oxygen by nasal cannula, moist oral mucous membranes  Neck  supple  Pulm/lungs  normal breathing effort, coarse crackle b/l  CVS/Heart  no rub or gallop  Abdomen:   Soft, distended, nontender  Extremities: no  pitting edema right leg  Neurologic:  Alert able to answer questions  Skin:  No acute rashes  Access:  Left forearm AV fistula       Basic Metabolic Panel:  Recent Labs  Lab 07/16/17 0900 07/20/17 1030 07/20/17 1609  NA 134* 132* 133*  K 4.0 4.1 4.1  CL 90* 92* 93*  CO2 29 25 24   GLUCOSE 183* 234* 234*  BUN 20 49* 46*  CREATININE 4.69* 5.38* 5.69*  CALCIUM 9.2 9.3 9.2  MG  --   --  1.9  PHOS  --  5.2*  --       CBC: Recent Labs  Lab 07/16/17 0900 07/20/17 0606  WBC 8.4 5.5  HGB 10.7* 11.1*  HCT 32.3* 33.2*  MCV 81.3 79.9*  PLT 88* 102*      Lab Results  Component Value Date   HEPBSAG Negative 02/20/2017      Microbiology:  Recent Results (from the past 240 hour(s))  MRSA PCR Screening     Status: None   Collection Time: 07/17/17  4:28 AM  Result Value Ref Range Status   MRSA by PCR NEGATIVE NEGATIVE Final    Comment:        The GeneXpert MRSA Assay (FDA approved for NASAL specimens only), is one component of a comprehensive MRSA colonization surveillance program. It is not intended to diagnose MRSA infection nor to guide or monitor treatment for MRSA infections. Performed at [facility], [address] [place], [place] [address]     Coagulation Studies: Recent Labs    07/19/17 2458 07/20/17 0606 07/21/17 0632  LABPROT 17.1* 17.1* 16.6*  INR 1.41 1.41 1.35    Urinalysis: No results for input(s): COLORURINE, LABSPEC, PHURINE, GLUCOSEU, HGBUR, BILIRUBINUR, KETONESUR, PROTEINUR, UROBILINOGEN, NITRITE, LEUKOCYTESUR in the last 72 hours.  Invalid input(s): APPERANCEUR  Imaging: Dg Chest Port 1 View  Result Date: 07/20/2017 CLINICAL DATA:  Shortness of breath. EXAM: PORTABLE CHEST 1 VIEW COMPARISON:  Chest x-ray dated July 16, 2017. FINDINGS: Stable mild cardiomegaly status post CABG. Persistent mild vascular congestion. Slightly improved aeration at the left lung base. No focal consolidation, pleural effusion, or pneumothorax. No acute osseous abnormality. IMPRESSION: Slightly improved aeration at the left lung base. Persistent mild vascular congestion. Electronically Signed   By: Titus Dubin M.D.   On: 07/20/2017 11:01     Medications:    . aspirin EC  81 mg Oral Daily  . budesonide (PULMICORT) nebulizer solution  0.5 mg Nebulization BID  . [START ON 07/22/2017] epoetin (EPOGEN/PROCRIT) injection  4,000 Units Intravenous Q M,W,F-HD   . furosemide  80 mg Intravenous BID  . gabapentin  100 mg Oral TID  . hydrALAZINE  50 mg Oral BID  . insulin aspart  0-15 Units Subcutaneous TID WC  . insulin aspart  0-5 Units Subcutaneous QHS  . ipratropium-albuterol  3 mL Nebulization QID  . methylPREDNISolone (SOLU-MEDROL) injection  60 mg Intravenous Q24H  . metoprolol succinate  25 mg Oral Daily  . pantoprazole  40 mg Oral BID AC  . rosuvastatin  20 mg Oral QHS  . sodium chloride flush  3 mL Intravenous Q12H  . vancomycin  125 mg Oral Q6H  . warfarin  7.5 mg Oral ONCE-1800  . Warfarin - Pharmacist Dosing Inpatient   Does not apply q1800   acetaminophen **OR** acetaminophen, albuterol, ondansetron **OR** ondansetron (ZOFRAN) IV, polyethylene glycol, simethicone  Assessment/ Plan:  67 y.o.  African-American male with end-stage renal disease, hemodialysis, hypertension, CABG, TIA, CVA, diabetes insulin-dependent, peripheral vascular disease  MWF [facility] [place] Left AVF  1.  End-stage renal disease 2.  Hypertension 3.  Anemia of chronic kidney disease 4.  Secondary hyperparathyroidism 5.  Shortness of breath from  influenza A and pulmonary edema 6.  Lower extremity edema  Patient is admitted with influenza A.  He is profoundly weak and has some cough and shortness of breath.  His respiratory illness is likely a combination of some fluid and influenza A.  He is off of oxygen supplementation now   Continue HD MWF schedule Continue Lasix 80 BID   Monitor Phos during hospitalization.  No binders listed in home medications Treatment of influenza as per hospitalist team Extra hemodialysis treatment was given on Saturday.  2.7  L of fluid removed Next hemodialysis planned for Monday   LOS: [place] 2/10/201911:06 [place], [place]

## 2017-07-21 NOTE — Progress Notes (Signed)
Dr. Jerelyn Charles made aware of + GI panel for Enterotoxigenic E.Coli

## 2017-07-21 NOTE — Progress Notes (Addendum)
[place] at [place] NAME: Terry Tyler    MR#:  [id number]  DATE OF BIRTH:  1950/08/04  SUBJECTIVE:  CHIEF COMPLAINT:   Chief Complaint  Patient presents with  . Weakness  . Shortness of Breath  Patient feeling better today, in discussion with support staff as well as nephrology-patient to remain for 1 more day of hemodialysis on tomorrow  REVIEW OF SYSTEMS:  CONSTITUTIONAL: No fever, fatigue or weakness.  EYES: No blurred or double vision.  EARS, NOSE, AND THROAT: No tinnitus or ear pain.  RESPIRATORY: No cough, shortness of breath, wheezing or hemoptysis.  CARDIOVASCULAR: No chest pain, orthopnea, edema.  GASTROINTESTINAL: No nausea, vomiting, diarrhea or abdominal pain.  GENITOURINARY: No dysuria, hematuria.  ENDOCRINE: No polyuria, nocturia,  HEMATOLOGY: No anemia, easy bruising or bleeding SKIN: No rash or lesion. MUSCULOSKELETAL: No joint pain or arthritis.   NEUROLOGIC: No tingling, numbness, weakness.  PSYCHIATRY: No anxiety or depression.   ROS  DRUG ALLERGIES:   Allergies  Allergen Reactions  . Lisinopril Swelling  . Atorvastatin Calcium Other (See Comments)    Muscle cramps   . Ezetimibe Other (See Comments)    Cannot remember what the allergy was.  . Lipitor [Atorvastatin] Other (See Comments)    Muscle cramping    VITALS:  Blood pressure 139/72, pulse 86, temperature 98.2 F (36.8 C), temperature source Oral, resp. rate 14, weight 82.3 kg (181 lb 8 oz), SpO2 99 %.  PHYSICAL EXAMINATION:  GENERAL:  67 y.o.-year-old patient lying in the bed with no acute distress.  EYES: Pupils equal, round, reactive to light and accommodation. No scleral icterus. Extraocular muscles intact.  HEENT: Head atraumatic, normocephalic. Oropharynx and nasopharynx clear.  NECK:  Supple, no jugular venous distention. No thyroid enlargement, no tenderness.  LUNGS: Normal breath sounds bilaterally, no wheezing, rales,rhonchi or  crepitation. No use of accessory muscles of respiration.  CARDIOVASCULAR: S1, S2 normal. No murmurs, rubs, or gallops.  ABDOMEN: Soft, nontender, nondistended. Bowel sounds present. No organomegaly or mass.  EXTREMITIES: No pedal edema, cyanosis, or clubbing.  NEUROLOGIC: Cranial nerves II through XII are intact. Muscle strength 5/5 in all extremities. Sensation intact. Gait not checked.  PSYCHIATRIC: The patient is alert and oriented x 3.  SKIN: No obvious rash, lesion, or ulcer.   Physical Exam LABORATORY PANEL:   CBC Recent Labs  Lab 07/20/17 0606  WBC 5.5  HGB 11.1*  HCT 33.2*  PLT 102*   ------------------------------------------------------------------------------------------------------------------  Chemistries  Recent Labs  Lab 07/16/17 0900  07/20/17 1609  NA 134*   < > 133*  K 4.0   < > 4.1  CL 90*   < > 93*  CO2 29   < > 24  GLUCOSE 183*   < > 234*  BUN 20   < > 46*  CREATININE 4.69*   < > 5.69*  CALCIUM 9.2   < > 9.2  MG  --   --  1.9  AST 84*  --   --   ALT 33  --   --   ALKPHOS 477*  --   --   BILITOT 5.9*  --   --    < > = values in this interval not displayed.   ------------------------------------------------------------------------------------------------------------------  Cardiac Enzymes Recent Labs  Lab 07/16/17 1607 07/16/17 2323  TROPONINI 0.49* 0.70*   ------------------------------------------------------------------------------------------------------------------  RADIOLOGY:  Dg Chest Port 1 View  Result Date: 07/20/2017 CLINICAL DATA:  Shortness of breath. EXAM: PORTABLE CHEST  1 VIEW COMPARISON:  Chest x-ray dated July 16, 2017. FINDINGS: Stable mild cardiomegaly status post CABG. Persistent mild vascular congestion. Slightly improved aeration at the left lung base. No focal consolidation, pleural effusion, or pneumothorax. No acute osseous abnormality. IMPRESSION: Slightly improved aeration at the left lung base. Persistent mild  vascular congestion. Electronically Signed   By: Titus Dubin M.D.   On: 07/20/2017 11:01    ASSESSMENT AND PLAN:  1 acute influenza A Resolving Continue Tamiflu with the dialysis, scheduled breathing treatments, and with tapering as tolerated  2 acute fluid overload state Improved Exacerbated by above end-stage renal disease In discussion with support staff/Dr. Singh/nephrology for repeat hemodialysis on tomorrow with tentative plans for discharge thereafter  3 chronicESRD Stable Receives HD on M/W/F Nephrology input appreciated-for hemodialysis on tomorrow   4 acute on chronic systolic heart failure exacerbation  Echo with EF 35% Improving For repeat HD on tomorrow, continue aspirin, Lasix, Toprol, statin therapy  5 acute on chronic hypoxic respiratory Improving Acute fluid overload state from end-stage renal disease Plan of care as stated above  6 acute generalized weakness For HHPT s/p discharge-tentatively planned for tomorrow after hemodialysis  7 acute elevated troponin Noted hx CAD Due to Demand ischemia, influenza, CHF, and ESRD Stable on current regiment  8 history of recent C. difficile colitis Only completed 10 of 14-day course thus far at home Restart p.o. vancomycin 4 times daily for 1 week course, enteric precautions  All the records are reviewed and case discussed with Care Management/Social Workerr. Management plans discussed with the patient, family and they are in agreement.  CODE STATUS: full  TOTAL TIME TAKING CARE OF THIS PATIENT: 35 minutes.     POSSIBLE D/C IN 1-2 DAYS, DEPENDING ON CLINICAL CONDITION.   Avel Peace Salary M.D on 07/21/2017   Between 7am to 6pm - Pager - [phone]  After 6pm go to [email or web address] - password [email or web address] [facility]  Office  [phone]  CC: Primary care physician; Center, [facility]  Note: This dictation was prepared with Dragon dictation along with smaller  phrase technology. Any transcriptional errors that result from this process are unintentional.

## 2017-07-21 NOTE — Plan of Care (Signed)
  Progressing Elimination: Will not experience complications related to bowel motility 07/21/2017 0208 - Progressing by Loran Senters, RN Will not experience complications related to urinary retention 07/21/2017 0208 - Progressing by Loran Senters, RN Activity: Ability to tolerate increased activity will improve 07/21/2017 0208 - Progressing by Loran Senters, RN Education: Knowledge of disease and its progression will improve 07/21/2017 0208 - Progressing by Loran Senters, RN Activity: Ability to tolerate increased activity will improve 07/21/2017 0208 - Progressing by Loran Senters, RN Education: Knowledge of disease and its progression will improve 07/21/2017 0208 - Progressing by Loran Senters, RN

## 2017-07-22 LAB — GLUCOSE, CAPILLARY
GLUCOSE-CAPILLARY: 188 mg/dL — AB (ref 65–99)
GLUCOSE-CAPILLARY: 170 mg/dL — AB (ref 65–99)
GLUCOSE-CAPILLARY: 165 mg/dL — AB (ref 65–99)

## 2017-07-22 LAB — PROTIME-INR
INR: 1.56
PROTHROMBIN TIME: 18.5 s — AB (ref 11.4–15.2)

## 2017-07-22 MED ORDER — CEFDINIR 300 MG PO CAPS: 300.0000 mg | ORAL_CAPSULE | Freq: Two times a day (BID) | ORAL | 0 refills | Status: DC

## 2017-07-22 MED ORDER — FLORANEX PO PACK: 1.0000 g | PACK | Freq: Three times a day (TID) | ORAL | 0 refills | Status: DC

## 2017-07-22 MED ORDER — VANCOMYCIN HCL 125 MG PO CAPS: 125.0000 mg | ORAL_CAPSULE | Freq: Every day | ORAL | 0 refills | Status: DC

## 2017-07-22 MED ORDER — ASPIRIN 81 MG PO TBEC: 81.0000 mg | DELAYED_RELEASE_TABLET | Freq: Every day | ORAL | 0 refills | Status: AC

## 2017-07-22 MED ORDER — WARFARIN SODIUM 7.5 MG PO TABS
7.5000 mg | ORAL_TABLET | Freq: Once | ORAL | Status: AC
  Administered 2017-07-22: 7.5 mg via ORAL
  Filled 2017-07-22: qty 1

## 2017-07-22 MED ORDER — WARFARIN SODIUM 7.5 MG PO TABS: 7.5000 mg | ORAL_TABLET | Freq: Once | ORAL | 0 refills | Status: DC

## 2017-07-22 MED ORDER — SODIUM CHLORIDE 0.9 % IV SOLN
2.0000 g | INTRAVENOUS | Status: DC
  Filled 2017-07-22: qty 20

## 2017-07-22 NOTE — Progress Notes (Signed)
[facility], [place] 07/22/17  Subjective:  Patient seen and evaluated during hemodialysis. He tested positive for influenza A.    Objective:  Vital signs in last 24 hours:  Temp:  [97.4 F (36.3 C)-98.2 F (36.8 C)] 98.2 F (36.8 C) (02/11 0949) Pulse Rate:  [82-92] 90 (02/11 1245) Resp:  [14-18] 18 (02/11 0739) BP: (125-156)/(65-93) 149/74 (02/11 1245) SpO2:  [92 %-100 %] 100 % (02/11 1245) Weight:  [85.3 kg (188 lb 0.8 oz)-85.3 kg (188 lb 1.6 oz)] 85.3 kg (188 lb 0.8 oz) (02/11 0930)  Weight change: -2.578 kg (-11 oz) Filed Weights   07/21/17 0416 07/22/17 0355 07/22/17 0930  Weight: 82.3 kg (181 lb 8 oz) 85.3 kg (188 lb 1.6 oz) 85.3 kg (188 lb 0.8 oz)    Intake/Output:    Intake/Output Summary (Last 24 hours) at 07/22/2017 1250 Last data filed at 07/22/2017 0406 Gross per 24 hour  Intake 50 ml  Output 0 ml  Net 50 ml     Physical Exam: General:  laying in the bed  HEENT  moist oral mucous membranes  Neck  supple  Pulm/lungs  normal breathing effort, coarse crackle b/l  CVS/Heart  no rub or gallop  Abdomen:   Soft, distended, nontender  Extremities:  no pitting edema right leg  Neurologic:  Alert able to answer questions  Skin:  No acute rashes  Access:  Left forearm AV fistula       Basic Metabolic Panel:  Recent Labs  Lab 07/16/17 0900 07/20/17 1030 07/20/17 1609  NA 134* 132* 133*  K 4.0 4.1 4.1  CL 90* 92* 93*  CO2 29 25 24   GLUCOSE 183* 234* 234*  BUN 20 49* 46*  CREATININE 4.69* 5.38* 5.69*  CALCIUM 9.2 9.3 9.2  MG  --   --  1.9  PHOS  --  5.2*  --      CBC: Recent Labs  Lab 07/16/17 0900 07/20/17 0606  WBC 8.4 5.5  HGB 10.7* 11.1*  HCT 32.3* 33.2*  MCV 81.3 79.9*  PLT 88* 102*      Lab Results  Component Value Date   HEPBSAG Negative 02/20/2017      Microbiology:  Recent Results (from the past 240 hour(s))  MRSA PCR Screening     Status: None   Collection Time: 07/17/17  4:28 AM   Result Value Ref Range Status   MRSA by PCR NEGATIVE NEGATIVE Final    Comment:        The GeneXpert MRSA Assay (FDA approved for NASAL specimens only), is one component of a comprehensive MRSA colonization surveillance program. It is not intended to diagnose MRSA infection nor to guide or monitor treatment for MRSA infections. Performed at [facility], [address] [place],  [address]   Gastrointestinal Panel by PCR , Stool     Status: Abnormal   Collection Time: 07/20/17 12:34 PM  Result Value Ref Range Status   Campylobacter species NOT DETECTED NOT DETECTED Final   Plesimonas shigelloides NOT DETECTED NOT DETECTED Final   Salmonella species NOT DETECTED NOT DETECTED Final   Yersinia enterocolitica NOT DETECTED NOT DETECTED Final   Vibrio species NOT DETECTED NOT DETECTED Final   Vibrio cholerae NOT DETECTED NOT DETECTED Final   Enteroaggregative E coli (EAEC) NOT DETECTED NOT DETECTED Final   Enteropathogenic E coli (EPEC) NOT DETECTED NOT DETECTED Final   Enterotoxigenic E coli (ETEC) DETECTED (A) NOT DETECTED Final    Comment: RESULT CALLED  TO, READ BACK BY AND VERIFIED WITH: JESSICA CHRISTMAS AT 6063 ON 07/21/17 BY SNJ    Shiga like toxin producing E coli (STEC) NOT DETECTED NOT DETECTED Final   Shigella/Enteroinvasive E coli (EIEC) NOT DETECTED NOT DETECTED Final   Cryptosporidium NOT DETECTED NOT DETECTED Final   Cyclospora cayetanensis NOT DETECTED NOT DETECTED Final   Entamoeba histolytica NOT DETECTED NOT DETECTED Final   Giardia lamblia NOT DETECTED NOT DETECTED Final   Adenovirus F40/41 NOT DETECTED NOT DETECTED Final   Astrovirus NOT DETECTED NOT DETECTED Final   Norovirus GI/GII NOT DETECTED NOT DETECTED Final   Rotavirus A NOT DETECTED NOT DETECTED Final   Sapovirus (I, II, IV, and V) NOT DETECTED NOT DETECTED Final    Comment: Performed at [facility], [address] [place], [place] [address]    Coagulation  Studies: Recent Labs    07/20/17 0606 07/21/17 0632 07/22/17 0544  LABPROT 17.1* 16.6* 18.5*  INR 1.41 1.35 1.56    Urinalysis: No results for input(s): COLORURINE, LABSPEC, PHURINE, GLUCOSEU, HGBUR, BILIRUBINUR, KETONESUR, PROTEINUR, UROBILINOGEN, NITRITE, LEUKOCYTESUR in the last 72 hours.  Invalid input(s): APPERANCEUR    Imaging: No results found.   Medications:   . cefTRIAXone (ROCEPHIN)  IV     . aspirin EC  81 mg Oral Daily  . budesonide (PULMICORT) nebulizer solution  0.5 mg Nebulization BID  . epoetin (EPOGEN/PROCRIT) injection  4,000 Units Intravenous Q M,W,F-HD  . furosemide  80 mg Intravenous BID  . gabapentin  100 mg Oral TID  . hydrALAZINE  50 mg Oral BID  . insulin aspart  0-15 Units Subcutaneous TID WC  . insulin aspart  0-5 Units Subcutaneous QHS  . lactobacillus  1 g Oral TID WC  . methylPREDNISolone (SOLU-MEDROL) injection  60 mg Intravenous Q24H  . metoprolol succinate  25 mg Oral Daily  . pantoprazole  40 mg Oral BID AC  . rosuvastatin  20 mg Oral QHS  . sodium chloride flush  3 mL Intravenous Q12H  . vancomycin  125 mg Oral Q6H  . warfarin  7.5 mg Oral ONCE-1800  . Warfarin - Pharmacist Dosing Inpatient   Does not apply q1800   acetaminophen **OR** acetaminophen, albuterol, ondansetron **OR** ondansetron (ZOFRAN) IV, polyethylene glycol, simethicone  Assessment/ Plan:  67 y.o.  African-American male with end-stage renal disease, hemodialysis, hypertension, CABG, TIA, CVA, diabetes insulin-dependent, peripheral vascular disease, admitted with influenza A  MWF [facility] Left AVF  1.  End-stage renal disease 2.  Hypertension 3.  Anemia of chronic kidney disease 4.  Secondary hyperparathyroidism 5.  Shortness of breath from  influenza A and pulmonary edema 6.  Lower extremity edema  -Patient seen and evaluated during hemodialysis.  He is on isolation at the moment.  We plan to complete dialysis today.  Ultrafiltration  target 2.5 kg.  Otherwise management of influenza as per primary team.  Hemoglobin currently 11.1.  Maintain the patient on Epogen 4000 units IV with dialysis while here.  Further plan as patient progresses.   LOS: 6 Carolan Avedisian 2/11/201912:50 PM  [facility] [place], [place]

## 2017-07-22 NOTE — Care Management (Addendum)
Patient for discharge home today.  Agency preference for home health is [facility].  Patient was just closed to agency 07/11/2017 after receiving PT and OT.  Order present for RN PT aide and social work.   Notified [facility] Patient does not qualify for home oxygen

## 2017-07-22 NOTE — Progress Notes (Signed)
Discharge instructions explained to pt, pts wife, and pts care manager/ verbalized an understanding/ iv and tele removed/ transported off unit via wheelchair.

## 2017-07-22 NOTE — Discharge Summary (Signed)
[place] [facility] [place] NAME: Terry Tyler    MR#:  [id number]  DATE OF BIRTH:  June 29, 1950  DATE OF ADMISSION:  07/16/2017 ADMITTING PHYSICIAN: Hillary Bow, MD  DATE OF DISCHARGE: No discharge date for patient encounter.  PRIMARY CARE PHYSICIAN: [facility]    ADMISSION DIAGNOSIS:  Demand ischemia ([place]) [I24.8] Generalized weakness [R53.1]  DISCHARGE DIAGNOSIS:  Active Problems:   CHF (congestive heart failure) ([place])   SECONDARY DIAGNOSIS:   Past Medical History:  Diagnosis Date  . C. difficile diarrhea   . CKD (chronic kidney disease)   . Coronary atherosclerosis of unspecified type of vessel, native or graft    Myocardial infarction in 2006. Cardiac catheterization showed significant three-vessel coronary artery disease. He underwent CABG at [facility]. Most recent cardiac catheterization in 2010 showed normal LV systolic function, patent grafts to OM1, RCA and LAD. Occluded SVG to diagonal.  . 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 Dialysis patient [facility])    Mon. Wed. Fri. for dialysis  . Hyperlipidemia   . Hypertension   . Hypertension, renal disease   . Impotence of organic origin   . Keloid scar   . Myocardial infarction ([place]) 2006  . Neoplasm of uncertain behavior, site unspecified   . Peripheral vascular disease, unspecified ([place])   . Proteinuria   . Stroke [facility]) 04/2016   TIA  . Unspecified vitamin D deficiency     HOSPITAL COURSE:  1 acuteinfluenza A Resolving Treated with a course of Tamiflu while in house   2 acute fluid overload state Resolved Exacerbated by above end-stage renal disease Treated with IV Lasix and most importantly hemodialysis, did receive hemodialysis prior to discharge   3 chronicESRD Stable Receives HD on M/W/F Nephrology did follow patient while in house for hemodialysis needs  4 acute on chronicsystolic heart  failureexacerbation [place] EF 35% Resolved Status post hemodialysis prior to discharge, patient continued on aspirin, Toprol, and statin therapy  5acute on chronic hypoxic respiratory Resolved Secondary to fluid overload state from end-stage renal disease  6acute generalized weakness Evaluated by physical therapy while in house-recommended home health PT status post discharge for continued care   7 acuteelevated troponin Stable Noted hx CAD Due toDemand ischemia,influenza, CHF, and ESRD  8 history of recent C. difficile colitis Only completed 10 of 14-day course per patient/patient's daughter  Vancomycin restarted while in house and will be continued status post discharge to complete course   9 acute E. coli enterocolitis Resolving  Treated with Rocephin while in house, to complete Cefdinir ear status post discharge   DISCHARGE CONDITIONS:  On the day of discharge patient is afebrile, hemodynamic is stable, tolerating a diet, ready for discharge to home with home health services, patient follow-up primary care provider in 3-5 days for reevaluation, for more specific details please see chart  CONSULTS OBTAINED:  Treatment Team:  Yolonda Kida, MD Murlean Iba, MD  DRUG ALLERGIES:   Allergies  Allergen Reactions  . Lisinopril Swelling  . Atorvastatin Calcium Other (See Comments)    Muscle cramps   . Ezetimibe Other (See Comments)    Cannot remember what the allergy was.  . Lipitor [Atorvastatin] Other (See Comments)    Muscle cramping    DISCHARGE MEDICATIONS:   Allergies as of 07/22/2017      Reactions   Lisinopril Swelling   Atorvastatin Calcium Other (See Comments)   Muscle cramps   Ezetimibe  Other (See Comments)   Cannot remember what the allergy was.   Lipitor [atorvastatin] Other (See Comments)   Muscle cramping      Medication List    STOP taking these medications   Simethicone 250 MG Caps   vancomycin 50 mg/mL oral  solution Commonly known as:  VANCOCIN     TAKE these medications   aspirin 81 MG EC tablet Take 1 tablet (81 mg total) by mouth daily.   brompheniramine-pseudoephedrine-DM 30-2-10 MG/5ML syrup Take 5 mLs by mouth 4 (four) times daily as needed.   cefdinir 300 MG capsule Commonly known as:  OMNICEF Take 1 capsule (300 mg total) by mouth 2 (two) times daily.   gabapentin 300 MG capsule Commonly known as:  NEURONTIN Take 300 mg by mouth 3 (three) times daily.   hydrALAZINE 50 MG tablet Commonly known as:  APRESOLINE Take 50 mg by mouth 2 (two) times daily.   lactobacillus Pack Take 1 packet (1 g total) by mouth 3 (three) times daily with meals.   metoprolol tartrate 25 MG tablet Commonly known as:  LOPRESSOR Take 25 mg by mouth daily.   pantoprazole 40 MG tablet Commonly known as:  PROTONIX Take 1 tablet (40 mg total) by mouth 2 (two) times daily before a meal.   rosuvastatin 20 MG tablet Commonly known as:  CRESTOR Take 20 mg by mouth at bedtime.   vancomycin 125 MG capsule Commonly known as:  VANCOCIN Take 1 capsule (125 mg total) by mouth daily. To start after finishing the twice a day prescription   warfarin 7.5 MG tablet Commonly known as:  COUMADIN Take 1 tablet (7.5 mg total) by mouth one time only at 6 PM. What changed:    medication strength  how much to take  when to take this        DISCHARGE INSTRUCTIONS:    If you experience worsening of your admission symptoms, develop shortness of breath, life threatening emergency, suicidal or homicidal thoughts you must seek medical attention immediately by calling 911 or calling your MD immediately  if symptoms less severe.  You Must read complete instructions/literature along with all the possible adverse reactions/side effects for all the Medicines you take and that have been prescribed to you. Take any new Medicines after you have completely understood and accept all the possible adverse reactions/side  effects.   Please note  You were cared for by a hospitalist during your hospital stay. If you have any questions about your discharge medications or the care you received while you were in the hospital after you are discharged, you can call the unit and asked to speak with the hospitalist on call if the hospitalist that took care of you is not available. Once you are discharged, your primary care physician will handle any further medical issues. Please note that NO REFILLS for any discharge medications will be authorized once you are discharged, as it is imperative that you return to your primary care physician (or establish a relationship with a primary care physician if you do not have one) for your aftercare needs so that they can reassess your need for medications and monitor your lab values.    Today   CHIEF COMPLAINT:   Chief Complaint  Patient presents with  . Weakness  . Shortness of Breath    HISTORY OF PRESENT ILLNESS:  67 y.o. male with a known history of diabetes mellitus, hypertension, incisional disease, CAD, chronic systolic CHF with ejection fraction 35% presents to the  emergency room due to generalized weakness.  Patient had his routine hemodialysis yesterday and was thought to be more fluid overloaded and with generalized weakness was asked to go to the emergency room.  Patient chose to go home.  Today he had worsening weakness, shortness of breath, cough and wheezing and presented to the emergency room.  Here patient's chest x-ray shows vascular congestion..  Influenza A is positive.  Troponin found to be 0.48.  No chest pain.  The patient underwent drug-eluting stent to the saphenous vein graft to the PDA in April  2018. VITAL SIGNS:  Blood pressure (!) 144/72, pulse 88, temperature 98.2 F (36.8 C), resp. rate 18, weight 85.3 kg (188 lb 1.6 oz), SpO2 92 %.  I/O:    Intake/Output Summary (Last 24 hours) at 07/22/2017 1012 Last data filed at 07/22/2017 0406 Gross per  24 hour  Intake 50 ml  Output 200 ml  Net -150 ml    PHYSICAL EXAMINATION:  GENERAL:  67 y.o.-year-old patient lying in the bed with no acute distress.  EYES: Pupils equal, round, reactive to light and accommodation. No scleral icterus. Extraocular muscles intact.  HEENT: Head atraumatic, normocephalic. Oropharynx and nasopharynx clear.  NECK:  Supple, no jugular venous distention. No thyroid enlargement, no tenderness.  LUNGS: Normal breath sounds bilaterally, no wheezing, rales,rhonchi or crepitation. No use of accessory muscles of respiration.  CARDIOVASCULAR: S1, S2 normal. No murmurs, rubs, or gallops.  ABDOMEN: Soft, non-tender, non-distended. Bowel sounds present. No organomegaly or mass.  EXTREMITIES: No pedal edema, cyanosis, or clubbing.  NEUROLOGIC: Cranial nerves II through XII are intact. Muscle strength 5/5 in all extremities. Sensation intact. Gait not checked.  PSYCHIATRIC: The patient is alert and oriented x 3.  SKIN: No obvious rash, lesion, or ulcer.   DATA REVIEW:   CBC Recent Labs  Lab 07/20/17 0606  WBC 5.5  HGB 11.1*  HCT 33.2*  PLT 102*    Chemistries  Recent Labs  Lab 07/16/17 0900  07/20/17 1609  NA 134*   < > 133*  K 4.0   < > 4.1  CL 90*   < > 93*  CO2 29   < > 24  GLUCOSE 183*   < > 234*  BUN 20   < > 46*  CREATININE 4.69*   < > 5.69*  CALCIUM 9.2   < > 9.2  MG  --   --  1.9  AST 84*  --   --   ALT 33  --   --   ALKPHOS 477*  --   --   BILITOT 5.9*  --   --    < > = values in this interval not displayed.    Cardiac Enzymes Recent Labs  Lab 07/16/17 2323  TROPONINI 0.70*    Microbiology Results  Results for orders placed or performed during the hospital encounter of 07/16/17  MRSA PCR Screening     Status: None   Collection Time: 07/17/17  4:28 AM  Result Value Ref Range Status   MRSA by PCR NEGATIVE NEGATIVE Final    Comment:        The GeneXpert MRSA Assay (FDA approved for NASAL specimens only), is one component of  a comprehensive MRSA colonization surveillance program. It is not intended to diagnose MRSA infection nor to guide or monitor treatment for MRSA infections. Performed at [facility], [address], [place], [place] [address]   Gastrointestinal Panel by PCR , Stool  Status: Abnormal   Collection Time: 07/20/17 12:34 PM  Result Value Ref Range Status   Campylobacter species NOT DETECTED NOT DETECTED Final   Plesimonas shigelloides NOT DETECTED NOT DETECTED Final   Salmonella species NOT DETECTED NOT DETECTED Final   Yersinia enterocolitica NOT DETECTED NOT DETECTED Final   Vibrio species NOT DETECTED NOT DETECTED Final   Vibrio cholerae NOT DETECTED NOT DETECTED Final   Enteroaggregative E coli (EAEC) NOT DETECTED NOT DETECTED Final   Enteropathogenic E coli (EPEC) NOT DETECTED NOT DETECTED Final   Enterotoxigenic E coli (ETEC) DETECTED (A) NOT DETECTED Final    Comment: RESULT CALLED TO, READ BACK BY AND VERIFIED WITH: JESSICA CHRISTMAS AT 1519 ON 07/21/17 BY SNJ    Shiga like toxin producing E coli (STEC) NOT DETECTED NOT DETECTED Final   Shigella/Enteroinvasive E coli (EIEC) NOT DETECTED NOT DETECTED Final   Cryptosporidium NOT DETECTED NOT DETECTED Final   Cyclospora cayetanensis NOT DETECTED NOT DETECTED Final   Entamoeba histolytica NOT DETECTED NOT DETECTED Final   Giardia lamblia NOT DETECTED NOT DETECTED Final   Adenovirus F40/41 NOT DETECTED NOT DETECTED Final   Astrovirus NOT DETECTED NOT DETECTED Final   Norovirus GI/GII NOT DETECTED NOT DETECTED Final   Rotavirus A NOT DETECTED NOT DETECTED Final   Sapovirus (I, II, IV, and V) NOT DETECTED NOT DETECTED Final    Comment: Performed at [facility], [address], [place], [place] [address]    RADIOLOGY:  Dg Chest Port 1 View  Result Date: 07/20/2017 CLINICAL DATA:  Shortness of breath. EXAM: PORTABLE CHEST 1 VIEW COMPARISON:  Chest x-ray dated July 16, 2017. FINDINGS: Stable mild  cardiomegaly status post CABG. Persistent mild vascular congestion. Slightly improved aeration at the left lung base. No focal consolidation, pleural effusion, or pneumothorax. No acute osseous abnormality. IMPRESSION: Slightly improved aeration at the left lung base. Persistent mild vascular congestion. Electronically Signed   By: Titus Dubin M.D.   On: 07/20/2017 11:01    EKG:   Orders placed or performed during the hospital encounter of 07/16/17  . EKG 12-Lead  . EKG 12-Lead      Management plans discussed with the patient, family and they are in agreement.  CODE STATUS:     Code Status Orders  (From admission, onward)        Start     Ordered   07/16/17 1131  Full code  Continuous     07/16/17 1133    Code Status History    Date Active Date Inactive Code Status Order ID Comments User Context   05/07/2017 13:20 05/10/2017 21:28 Full Code [id number]  Henreitta Leber, MD Inpatient   02/19/2017 15:35 02/21/2017 15:25 Full Code [id number]  Vaughan Basta, MD Inpatient   01/03/2017 09:26 01/03/2017 14:26 Full Code [id number]  Algernon Huxley, MD Inpatient   09/08/2016 17:58 09/11/2016 14:25 Full Code [id number]  Vaughan Basta, MD Inpatient   04/10/2016 00:35 04/11/2016 15:25 Full Code [id number]  Harvie Bridge, DO Inpatient   04/10/2016 00:35 04/10/2016 00:35 Full Code [id number]  Hugelmeyer, Ubaldo Glassing, DO Inpatient      TOTAL TIME TAKING CARE OF THIS PATIENT: 45 minutes.    Avel Peace Eldridge Marcott M.D on 07/22/2017 at 10:12 AM  Between 7am to 6pm - Pager - [phone]  After 6pm go to [email or web address] - password [email or web address] [place] [facility]  Office  [phone]  CC: Primary care physician; [facility]   Note: This dictation was prepared with [employer] dictation  along with smaller phrase technology. Any transcriptional errors that result from this process are unintentional.

## 2017-07-22 NOTE — Progress Notes (Signed)
Pre HD  

## 2017-07-22 NOTE — Progress Notes (Signed)
ANTICOAGULATION CONSULT NOTE - FOLLOW UP Consult  Pharmacy Consult for Warfarin Indication: atrial fibrillation  Allergies  Allergen Reactions  . Lisinopril Swelling  . Atorvastatin Calcium Other (See Comments)    Muscle cramps   . Ezetimibe Other (See Comments)    Cannot remember what the allergy was.  . Lipitor [Atorvastatin] Other (See Comments)    Muscle cramping    Patient Measurements: Weight: 188 lb 1.6 oz (85.3 kg) Heparin Dosing Weight:    Vital Signs: Temp: 98.2 F (36.8 C) (02/11 0739) Temp Source: Oral (02/11 0355) BP: 144/72 (02/11 0739) Pulse Rate: 88 (02/11 0739)  Labs: Recent Labs    07/20/17 0606 07/20/17 1030 07/20/17 1609 07/21/17 0632 07/22/17 0544  HGB 11.1*  --   --   --   --   HCT 33.2*  --   --   --   --   PLT 102*  --   --   --   --   LABPROT 17.1*  --   --  16.6* 18.5*  INR 1.41  --   --  1.35 1.56  CREATININE  --  5.38* 5.69*  --   --     Estimated Creatinine Clearance: 14.4 mL/min (A) (by C-G formula based on SCr of 5.69 mg/dL (H)).   Medical History: Past Medical History:  Diagnosis Date  . C. difficile diarrhea   . CKD (chronic kidney disease)   . Coronary atherosclerosis of unspecified type of vessel, native or graft    Myocardial infarction in 2006. Cardiac catheterization showed significant three-vessel coronary artery disease. He underwent CABG at [facility]. Most recent cardiac catheterization in 2010 showed normal LV systolic function, patent grafts to OM1, RCA and LAD. Occluded SVG to diagonal.  . 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 Dialysis patient [facility])    Mon. Wed. Fri. for dialysis  . Hyperlipidemia   . Hypertension   . Hypertension, renal disease   . Impotence of organic origin   . Keloid scar   . Myocardial infarction ([place]) 2006  . Neoplasm of uncertain behavior, site unspecified   . Peripheral vascular disease, unspecified ([place])   . Proteinuria   . Stroke [facility]) 04/2016   TIA  .  Unspecified vitamin D deficiency     Medications:  Scheduled:  . aspirin EC  81 mg Oral Daily  . budesonide (PULMICORT) nebulizer solution  0.5 mg Nebulization BID  . epoetin (EPOGEN/PROCRIT) injection  4,000 Units Intravenous Q M,W,F-HD  . furosemide  80 mg Intravenous BID  . gabapentin  100 mg Oral TID  . hydrALAZINE  50 mg Oral BID  . insulin aspart  0-15 Units Subcutaneous TID WC  . insulin aspart  0-5 Units Subcutaneous QHS  . lactobacillus  1 g Oral TID WC  . methylPREDNISolone (SOLU-MEDROL) injection  60 mg Intravenous Q24H  . metoprolol succinate  25 mg Oral Daily  . pantoprazole  40 mg Oral BID AC  . rosuvastatin  20 mg Oral QHS  . sodium chloride flush  3 mL Intravenous Q12H  . vancomycin  125 mg Oral Q6H  . warfarin  7.5 mg Oral ONCE-1800  . Warfarin - Pharmacist Dosing Inpatient   Does not apply q1800   Infusions:  . cefTRIAXone (ROCEPHIN)  IV 2 g (07/21/17 1753)    Assessment: 67 yo M on Warfarin PTA. Hemodialysis patient. Influenza A positive. Hx chronic mild thrombocytopenia. Home warfarin dose per Med Rec:  4 mg daily.  DATE INR DOSE 2/5 1.25 6 mg x 1 2/6 1.24 6 mg  2/7        1.39    6 mg  2/8        1.41    6 mg  2/9 1.41 7 mg 2/10 1.35     7.5mg  2/11     1.56         Goal of Therapy:  INR 2-3 Monitor platelets by anticoagulation protocol: Yes   Plan:  Will increase to Warfarin 7.5 mg x 1 tonight due to subtherapeutic INR.  F/u INR in am  Donna Christen Desani Sprung, Pharm.D., BCGP Clinical Pharmacist 07/22/2017,8:53 AM

## 2017-07-22 NOTE — Care Management Important Message (Signed)
Important Message  Patient Details  Name: Terry Tyler MRN: [id number] Date of Birth: Jun 07, 1951   Medicare Important Message Given:  Yes    Katrina Stack, RN 07/22/2017, 2:38 PM

## 2017-07-22 NOTE — Progress Notes (Signed)
HD started. 

## 2017-07-29 NOTE — Care Management (Signed)
Informed by [facility] that patient refused home health- event though he was in agreement with the referral when discussed.

## 2017-08-20 ENCOUNTER — Ambulatory Visit: Payer: Medicare HMO | Admitting: Podiatry

## 2017-08-20 ENCOUNTER — Encounter: Payer: Self-pay | Admitting: Podiatry

## 2017-08-20 DIAGNOSIS — E0843 Diabetes mellitus due to underlying condition with diabetic autonomic (poly)neuropathy: Secondary | ICD-10-CM

## 2017-08-20 DIAGNOSIS — M79676 Pain in unspecified toe(s): Secondary | ICD-10-CM

## 2017-08-20 DIAGNOSIS — B351 Tinea unguium: Secondary | ICD-10-CM | POA: Diagnosis not present

## 2017-08-20 DIAGNOSIS — L84 Corns and callosities: Secondary | ICD-10-CM | POA: Diagnosis not present

## 2017-08-20 NOTE — Progress Notes (Signed)
   Subjective:    Patient ID: Terry Tyler, male    DOB: January 09, 1951, 67 y.o.   MRN: [id number]  HPI    Review of Systems  Cardiovascular: Positive for leg swelling.  Musculoskeletal: Positive for gait problem.  Neurological: Positive for weakness.  All other systems reviewed and are negative.      Objective:   Physical Exam        Assessment & Plan:

## 2017-08-21 NOTE — Progress Notes (Signed)
    Subjective: Patient is a 67 y.o. male presenting to the office today as a new patient with a chief complaint of a painful callus lesion to the right great toe that has been present for several months. Applying pressure to the area increases the pain. He has tried "scraping" the corn down for treatment.   Patient also complains of elongated, thickened nails that cause pain while ambulating in shoes. He is unable to trim his own nails. Patient presents today for further treatment and evaluation.  Past Medical History:  Diagnosis Date  . C. difficile diarrhea   . CKD (chronic kidney disease)   . Coronary atherosclerosis of unspecified type of vessel, native or graft    Myocardial infarction in 2006. Cardiac catheterization showed significant three-vessel coronary artery disease. He underwent CABG at [facility]. Most recent cardiac catheterization in 2010 showed normal LV systolic function, patent grafts to OM1, RCA and LAD. Occluded SVG to diagonal.  . 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 Dialysis patient [facility])    Mon. Wed. Fri. for dialysis  . Hyperlipidemia   . Hypertension   . Hypertension, renal disease   . Impotence of organic origin   . Keloid scar   . Myocardial infarction ([place]) 2006  . Neoplasm of uncertain behavior, site unspecified   . Peripheral vascular disease, unspecified ([place])   . Proteinuria   . Stroke [facility]) 04/2016   TIA  . Unspecified vitamin D deficiency     Objective:  Physical Exam General: Alert and oriented x3 in no acute distress  Dermatology: Hyperkeratotic lesion present on the right great toe. Pain on palpation with a central nucleated core noted. Skin is warm, dry and supple bilateral lower extremities. Negative for open lesions or macerations. Nails are tender, long, thickened and dystrophic with subungual debris, consistent with onychomycosis, 1-5 bilateral. No signs of infection noted.  Vascular: Palpable pedal pulses  bilaterally. No edema or erythema noted. Capillary refill within normal limits.  Neurological: Epicritic and protective threshold grossly intact bilaterally.   Musculoskeletal Exam: Pain on palpation at the keratotic lesion noted. Range of motion within normal limits bilateral. Muscle strength 5/5 in all groups bilateral.  Assessment: 1. Onychodystrophic nails 1-5 bilateral with hyperkeratosis of nails.  2. Onychomycosis of nail due to dermatophyte bilateral 3. Pre-ulcerative callus lesion noted to the right great toe   Plan of Care:  #1 Patient evaluated. #2 Excisional debridement of keratoic lesion using a chisel blade was performed without incident.  #3 Dressed with light dressing. #4 Mechanical debridement of nails 1-5 bilaterally performed using a nail nipper. Filed with dremel without incident.  #5 Patient is to return to the clinic in 3 months.   Edrick Kins, DPM [facility]  Dr. Edrick Kins, [address]                                        [place], [place] [address]                Office [phone]  Fax [phone]

## 2017-08-27 ENCOUNTER — Other Ambulatory Visit: Payer: Self-pay

## 2017-08-27 ENCOUNTER — Encounter: Payer: Self-pay | Admitting: Emergency Medicine

## 2017-08-27 DIAGNOSIS — Z5321 Procedure and treatment not carried out due to patient leaving prior to being seen by health care provider: Secondary | ICD-10-CM | POA: Diagnosis not present

## 2017-08-27 DIAGNOSIS — T82838A Hemorrhage of vascular prosthetic devices, implants and grafts, initial encounter: Secondary | ICD-10-CM | POA: Diagnosis not present

## 2017-08-27 DIAGNOSIS — Y829 Unspecified medical devices associated with adverse incidents: Secondary | ICD-10-CM | POA: Diagnosis not present

## 2017-08-27 LAB — COMPREHENSIVE METABOLIC PANEL
ALBUMIN: 3.2 g/dL — AB (ref 3.5–5.0)
ALT: 29 U/L (ref 17–63)
ANION GAP: 15 (ref 5–15)
AST: 64 U/L — AB (ref 15–41)
Alkaline Phosphatase: 384 U/L — ABNORMAL HIGH (ref 38–126)
BILIRUBIN TOTAL: 3.7 mg/dL — AB (ref 0.3–1.2)
BUN: 20 mg/dL (ref 6–20)
CHLORIDE: 95 mmol/L — AB (ref 101–111)
CO2: 27 mmol/L (ref 22–32)
Calcium: 8.4 mg/dL — ABNORMAL LOW (ref 8.9–10.3)
Creatinine, Ser: 4.49 mg/dL — ABNORMAL HIGH (ref 0.61–1.24)
GFR calc Af Amer: 15 mL/min — ABNORMAL LOW (ref >60)
GFR, EST NON AFRICAN AMERICAN: 13 mL/min — AB (ref >60)
Glucose, Bld: 145 mg/dL — ABNORMAL HIGH (ref 65–99)
POTASSIUM: 3.8 mmol/L (ref 3.5–5.1)
Sodium: 137 mmol/L (ref 135–145)
TOTAL PROTEIN: 8.5 g/dL — AB (ref 6.5–8.1)

## 2017-08-27 LAB — CBC
HEMATOCRIT: 34.9 % — AB (ref 40.0–52.0)
Hemoglobin: 11.4 g/dL — ABNORMAL LOW (ref 13.0–18.0)
MCH: 26.7 pg (ref 26.0–34.0)
MCHC: 32.8 g/dL (ref 32.0–36.0)
MCV: 81.5 fL (ref 80.0–100.0)
PLATELETS: 174 10*3/uL (ref 150–440)
RBC: 4.28 MIL/uL — ABNORMAL LOW (ref 4.40–5.90)
RDW: 19.5 % — AB (ref 11.5–14.5)
WBC: 9.9 10*3/uL (ref 3.8–10.6)

## 2017-08-27 NOTE — ED Triage Notes (Signed)
Pt arrived to the ED accompanied by his family for complaints of bleeding from his fistula. Pt reports that he is a dialysis patient and that he has been bleeding from his fistula since it was accessed 2 days ago. Pt reports that he is on blood thinners. Pt is AOx4 in no apparent distress with no bleeding noticed during triage; site is bandaged.

## 2017-08-28 ENCOUNTER — Other Ambulatory Visit: Payer: Self-pay

## 2017-08-28 ENCOUNTER — Emergency Department
Admission: EM | Admit: 2017-08-28 | Discharge: 2017-08-28 | Disposition: A | Discharge status: Home / Self Care | Payer: Medicare HMO | Attending: Emergency Medicine | Admitting: Emergency Medicine

## 2017-08-28 ENCOUNTER — Other Ambulatory Visit (INDEPENDENT_AMBULATORY_CARE_PROVIDER_SITE_OTHER): Payer: Self-pay | Admitting: Vascular Surgery

## 2017-08-28 ENCOUNTER — Encounter (INDEPENDENT_AMBULATORY_CARE_PROVIDER_SITE_OTHER): Payer: Self-pay | Admitting: Vascular Surgery

## 2017-08-28 ENCOUNTER — Ambulatory Visit (INDEPENDENT_AMBULATORY_CARE_PROVIDER_SITE_OTHER): Payer: Medicare HMO | Admitting: Vascular Surgery

## 2017-08-28 ENCOUNTER — Encounter: Payer: Self-pay | Admitting: Emergency Medicine

## 2017-08-28 ENCOUNTER — Telehealth: Payer: Self-pay | Admitting: Emergency Medicine

## 2017-08-28 ENCOUNTER — Encounter (INDEPENDENT_AMBULATORY_CARE_PROVIDER_SITE_OTHER): Payer: Self-pay

## 2017-08-28 ENCOUNTER — Emergency Department
Admission: EM | Admit: 2017-08-28 | Discharge: 2017-08-28 | Disposition: A | Discharge status: Home / Self Care | Payer: Medicare HMO | Source: Home / Self Care | Attending: Emergency Medicine | Admitting: Emergency Medicine

## 2017-08-28 VITALS — BP 114/65 | HR 98 | Resp 16 | Wt 200.8 lb

## 2017-08-28 DIAGNOSIS — Y828 Other medical devices associated with adverse incidents: Secondary | ICD-10-CM

## 2017-08-28 DIAGNOSIS — I2510 Atherosclerotic heart disease of native coronary artery without angina pectoris: Secondary | ICD-10-CM | POA: Insufficient documentation

## 2017-08-28 DIAGNOSIS — N186 End stage renal disease: Secondary | ICD-10-CM

## 2017-08-28 DIAGNOSIS — T82838A Hemorrhage of vascular prosthetic devices, implants and grafts, initial encounter: Secondary | ICD-10-CM | POA: Insufficient documentation

## 2017-08-28 DIAGNOSIS — E1122 Type 2 diabetes mellitus with diabetic chronic kidney disease: Secondary | ICD-10-CM

## 2017-08-28 DIAGNOSIS — Z79899 Other long term (current) drug therapy: Secondary | ICD-10-CM

## 2017-08-28 DIAGNOSIS — Z7901 Long term (current) use of anticoagulants: Secondary | ICD-10-CM

## 2017-08-28 DIAGNOSIS — Z992 Dependence on renal dialysis: Secondary | ICD-10-CM

## 2017-08-28 DIAGNOSIS — R791 Abnormal coagulation profile: Secondary | ICD-10-CM | POA: Insufficient documentation

## 2017-08-28 DIAGNOSIS — I120 Hypertensive chronic kidney disease with stage 5 chronic kidney disease or end stage renal disease: Secondary | ICD-10-CM

## 2017-08-28 DIAGNOSIS — Z8673 Personal history of transient ischemic attack (TIA), and cerebral infarction without residual deficits: Secondary | ICD-10-CM | POA: Insufficient documentation

## 2017-08-28 DIAGNOSIS — N189 Chronic kidney disease, unspecified: Secondary | ICD-10-CM | POA: Diagnosis not present

## 2017-08-28 DIAGNOSIS — Z7982 Long term (current) use of aspirin: Secondary | ICD-10-CM

## 2017-08-28 DIAGNOSIS — I252 Old myocardial infarction: Secondary | ICD-10-CM

## 2017-08-28 DIAGNOSIS — D631 Anemia in chronic kidney disease: Secondary | ICD-10-CM | POA: Diagnosis not present

## 2017-08-28 DIAGNOSIS — Z87891 Personal history of nicotine dependence: Secondary | ICD-10-CM | POA: Insufficient documentation

## 2017-08-28 DIAGNOSIS — Z859 Personal history of malignant neoplasm, unspecified: Secondary | ICD-10-CM | POA: Insufficient documentation

## 2017-08-28 DIAGNOSIS — R58 Hemorrhage, not elsewhere classified: Secondary | ICD-10-CM

## 2017-08-28 LAB — COMPREHENSIVE METABOLIC PANEL
ALT: 28 U/L (ref 17–63)
ANION GAP: 11 (ref 5–15)
AST: 57 U/L — ABNORMAL HIGH (ref 15–41)
Albumin: 3 g/dL — ABNORMAL LOW (ref 3.5–5.0)
Alkaline Phosphatase: 355 U/L — ABNORMAL HIGH (ref 38–126)
BUN: 25 mg/dL — ABNORMAL HIGH (ref 6–20)
CHLORIDE: 97 mmol/L — AB (ref 101–111)
CO2: 27 mmol/L (ref 22–32)
Calcium: 8.2 mg/dL — ABNORMAL LOW (ref 8.9–10.3)
Creatinine, Ser: 5.38 mg/dL — ABNORMAL HIGH (ref 0.61–1.24)
GFR, EST AFRICAN AMERICAN: 12 mL/min — AB (ref >60)
GFR, EST NON AFRICAN AMERICAN: 10 mL/min — AB (ref >60)
Glucose, Bld: 157 mg/dL — ABNORMAL HIGH (ref 65–99)
POTASSIUM: 3.5 mmol/L (ref 3.5–5.1)
SODIUM: 135 mmol/L (ref 135–145)
Total Bilirubin: 3.4 mg/dL — ABNORMAL HIGH (ref 0.3–1.2)
Total Protein: 7.8 g/dL (ref 6.5–8.1)

## 2017-08-28 LAB — CBC
HEMATOCRIT: 30.5 % — AB (ref 40.0–52.0)
Hemoglobin: 10.2 g/dL — ABNORMAL LOW (ref 13.0–18.0)
MCH: 27 pg (ref 26.0–34.0)
MCHC: 33.3 g/dL (ref 32.0–36.0)
MCV: 80.9 fL (ref 80.0–100.0)
PLATELETS: 165 10*3/uL (ref 150–440)
RBC: 3.78 MIL/uL — AB (ref 4.40–5.90)
RDW: 18.9 % — ABNORMAL HIGH (ref 11.5–14.5)
WBC: 8.3 10*3/uL (ref 3.8–10.6)

## 2017-08-28 LAB — APTT: APTT: 115 s — AB (ref 24–36)

## 2017-08-28 LAB — PROTIME-INR
INR: 6.58
Prothrombin Time: 57.1 seconds — ABNORMAL HIGH (ref 11.4–15.2)

## 2017-08-28 MED ORDER — PHYTONADIONE 5 MG PO TABS
10.0000 mg | ORAL_TABLET | Freq: Once | ORAL | Status: AC
  Administered 2017-08-28: 10 mg via ORAL
  Filled 2017-08-28: qty 2

## 2017-08-28 MED ORDER — LIDOCAINE-EPINEPHRINE-TETRACAINE (LET) SOLUTION
3.0000 mL | Freq: Once | NASAL | Status: DC
  Filled 2017-08-28: qty 3

## 2017-08-28 NOTE — ED Notes (Signed)
Family up to desk inquiring about results and wait time. Explained to family that there was still a few people ahead of the patient but that all results are reviewed by RN as patients are waiting to be seen to make sure there are no critical values. Did explain to family that PT INR was not able to be done due to hemolysis per lab and they will wait to have it redrawn till seen by EDP. Per family no bleeding noted to dressing around fistula and pt is in no distress.

## 2017-08-28 NOTE — ED Notes (Signed)
Date and time results received: 08/28/17 1514 (use smartphrase ".now" to insert current time)  INR Critical Value: 6.51  Name of Provider Notified: Laban Emperor PA  No orders received at present

## 2017-08-28 NOTE — Telephone Encounter (Signed)
Called patient due to Terry Tyler to inquire about condition and follow up plans. Pt is at dr dews now to get  Treatment for fistula problem

## 2017-08-28 NOTE — Progress Notes (Signed)
Subjective:    Patient ID: Terry Tyler, male    DOB: 1951/03/27, 67 y.o.   MRN: [id number] Chief Complaint  Patient presents with  . Follow-up    infected access   The patient presents at the request of [facility] for evaluation of an "infected fistula".  The patient was at [facility] ED last night for bleeding from his fistula.  The patient notes that the dialysis center uses large needles.  He feels that these "large needles" make him bleed.  He states that he has asked the center to use smaller needles however they have not done this.  The patient notes bleeding from the proximal end of his fistula since his last treatment on Monday.  The patient was triaged at the ED however left as he was upset due to his length of wait.  The patient denies any left upper extremity pain or ulceration.  The patient denies any purulent drainage from the fistula site.  The patient denies any erythema to the fistula site.  The patient denies any fever, nausea vomiting for the fistula site.  The patient denies any swelling from the fistula site.   Review of Systems  Constitutional: Negative.   HENT: Negative.   Eyes: Negative.   Respiratory: Negative.   Cardiovascular:       Left upper extremity fistula bleeding  Gastrointestinal: Negative.   Endocrine: Negative.   Genitourinary: Negative.   Musculoskeletal: Negative.   Skin: Negative.   Allergic/Immunologic: Negative.   Neurological: Negative.   Hematological: Negative.   Psychiatric/Behavioral: Negative.       Objective:   Physical Exam  Constitutional: He is oriented to person, place, and time. He appears well-developed and well-nourished. No distress.  HENT:  Head: Normocephalic and atraumatic.  Eyes: Conjunctivae are normal. Pupils are equal, round, and reactive to light.  Neck: Normal range of motion.  Cardiovascular: Normal rate, normal heart sounds and intact distal pulses.  Pulses:      Radial  pulses are 2+ on the right side, and 2+ on the left side.  Left upper extremity fistula: Small pinhole area at the proximal and with some slow oozing.  There is no purulent discharge.  There is no erythema or swelling to the fistula.  It is not tender to palpation.  2+ radial pulse noted.  The left hand is warm.  Pulmonary/Chest: Effort normal and breath sounds normal.  Musculoskeletal: Normal range of motion. He exhibits no edema.  Neurological: He is alert and oriented to person, place, and time.  Skin: He is not diaphoretic.  Psychiatric: He has a normal mood and affect. His behavior is normal. Judgment and thought content normal.  Vitals reviewed.  BP 114/65 (BP Location: Right Arm)   Pulse 98   Resp 16   Wt 200 lb 12.8 oz (91.1 kg)   BMI 27.23 kg/m   Past Medical History:  Diagnosis Date  . C. difficile diarrhea   . CKD (chronic kidney disease)   . Coronary atherosclerosis of unspecified type of vessel, native or graft    Myocardial infarction in 2006. Cardiac catheterization showed significant three-vessel coronary artery disease. He underwent CABG at [facility]. Most recent cardiac catheterization in 2010 showed normal LV systolic function, patent grafts to OM1, RCA and LAD. Occluded SVG to diagonal.  . 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 Dialysis patient [facility])    Mon. Wed. Fri. for dialysis  . Hyperlipidemia   .  Hypertension   . Hypertension, renal disease   . Impotence of organic origin   . Keloid scar   . Myocardial infarction ([place]) 2006  . Neoplasm of uncertain behavior, site unspecified   . Peripheral vascular disease, unspecified ([place])   . Proteinuria   . Stroke [facility]) 04/2016   TIA  . Unspecified vitamin D deficiency    Social History   Socioeconomic History  . Marital status: Married    Spouse name: Not on file  . Number of children: Not on file  . Years of education: Not on file  . Highest education level: Not on file  Social Needs    . Financial resource strain: Not on file  . Food insecurity - worry: Not on file  . Food insecurity - inability: Not on file  . Transportation needs - medical: Not on file  . Transportation needs - non-medical: Not on file  Occupational History  . Not on file  Tobacco Use  . Smoking status: Former Smoker    Packs/day: 0.50    Years: 0.00    Pack years: 0.00    Types: Cigarettes    Last attempt to quit: 06/11/1980    Years since quitting: 37.2  . Smokeless tobacco: Never Used  Substance and Sexual Activity  . Alcohol use: No    Comment: socially  . Drug use: No  . Sexual activity: Not Currently  Other Topics Concern  . Not on file  Social History Narrative  . Not on file   Past Surgical History:  Procedure Laterality Date  . A/V FISTULAGRAM Left 01/03/2017   Procedure: A/V Fistulagram;  Surgeon: Algernon Huxley, MD;  Location: [place] [facility];  Service: Cardiovascular;  Laterality: Left;  . A/V FISTULAGRAM Left 06/06/2017   Procedure: A/V FISTULAGRAM;  Surgeon: Algernon Huxley, MD;  Location: [place] [facility];  Service: Cardiovascular;  Laterality: Left;  . A/V SHUNT INTERVENTION N/A 01/03/2017   Procedure: A/V Shunt Intervention;  Surgeon: Algernon Huxley, MD;  Location: [place] CV [facility];  Service: Cardiovascular;  Laterality: N/A;  . A/V SHUNT INTERVENTION N/A 06/06/2017   Procedure: A/V SHUNT INTERVENTION;  Surgeon: Algernon Huxley, MD;  Location: [place] [facility];  Service: Cardiovascular;  Laterality: N/A;  . AV FISTULA PLACEMENT Left 10/25/2016   Procedure: ARTERIOVENOUS (AV) FISTULA CREATION ( RADIOCEPHALIC );  Surgeon: Algernon Huxley, MD;  Location: [facility] ORS;  Service: Vascular;  Laterality: Left;  . CARDIAC CATHETERIZATION  12/2004   [facility];[place]  . CARDIAC CATHETERIZATION  09/2008   [facility];[place]  . COLONOSCOPY WITH PROPOFOL N/A 05/09/2017   Procedure: COLONOSCOPY WITH PROPOFOL;  Surgeon: Toledo, Benay Pike, MD;  Location: [facility] ENDOSCOPY;  Service: Gastroenterology;   Laterality: N/A;  . CORONARY ANGIOPLASTY    . CORONARY ARTERY BYPASS GRAFT  2006   [facility]  . CORONARY STENT INTERVENTION N/A 09/10/2016   Procedure: Coronary Stent Intervention;  Surgeon: Isaias Cowman, MD;  Location: [place] [facility];  Service: Cardiovascular;  Laterality: N/A;  . DIALYSIS/PERMA CATHETER REMOVAL N/A 02/14/2017   Procedure: DIALYSIS/PERMA CATHETER REMOVAL;  Surgeon: Algernon Huxley, MD;  Location: [place] [facility];  Service: Cardiovascular;  Laterality: N/A;  . ESOPHAGOGASTRODUODENOSCOPY (EGD) WITH PROPOFOL N/A 05/08/2017   Procedure: ESOPHAGOGASTRODUODENOSCOPY (EGD) WITH PROPOFOL;  Surgeon: Toledo, Benay Pike, MD;  Location: [facility] ENDOSCOPY;  Service: Gastroenterology;  Laterality: N/A;  . keloid removal     right neck  . LEFT HEART CATH AND CORONARY ANGIOGRAPHY N/A 09/10/2016  Procedure: Left Heart Cath and Coronary Angiography;  Surgeon: Isaias Cowman, MD;  Location: [place] [facility];  Service: Cardiovascular;  Laterality: N/A;   Family History  Problem Relation Age of Onset  . Hypertension Mother   . Hyperlipidemia Mother   . Heart disease Mother   . Heart disease Father   . Hyperlipidemia Father   . Hypertension Father   . Kidney disease Sister   . Diabetes Sister   . Hypertension Sister   . Diabetes Brother   . Hypertension Brother   . HIV/AIDS Brother    Allergies  Allergen Reactions  . Lisinopril Swelling  . Atorvastatin Calcium Other (See Comments)    Muscle cramps   . Ezetimibe Other (See Comments)    Cannot remember what the allergy was.  . Lipitor [Atorvastatin] Other (See Comments)    Muscle cramping      Assessment & Plan:  The patient presents at the request of [facility] for evaluation of an "infected fistula".  The patient was at [facility] ED last night for bleeding from his fistula.  The patient notes that the dialysis center uses large needles.  He feels that these "large needles" make him  bleed.  He states that he has asked the center to use smaller needles however they have not done this.  The patient notes bleeding from the proximal end of his fistula since his last treatment on Monday.  The patient was triaged at the ED however left as he was upset due to his length of wait.  The patient denies any left upper extremity pain or ulceration.  The patient denies any purulent drainage from the fistula site.  The patient denies any erythema to the fistula site.  The patient denies any fever, nausea vomiting for the fistula site.  The patient denies any swelling from the fistula site.  1. ESRD on dialysis ([place]) - Stable Patient with slow oozing from the proximal aspect of his fistula since Monday The patient made an attempt to be seen at [facility] ED last night however left due to a long wait time. There are no signs of infection noted to the fistula on examination I placed a pressure dressing to the area of bleeding.  I told the patient to remove the dressing in a few hours and if he was still bleeding he would need to go back to the ED to have it treated as I do not have the ability to stop the bleeding in the outpatient setting. I placed the patient on the schedule for Monday to undergo a fistulogram Procedure, risks and benefits explained to the patient all questions answered the patient is willing to proceed It is okay for the patient to dialyze and I told him and his family member who was present with him that.  2. Anemia due to chronic kidney disease, unspecified CKD stage - Stable Patient presents asymptomatically This is followed by his nephrologist  Current Outpatient Medications on File Prior to Visit  Medication Sig Dispense Refill  . aspirin EC 81 MG EC tablet Take 1 tablet (81 mg total) by mouth daily. 180 tablet 0  . gabapentin (NEURONTIN) 300 MG capsule Take 300 mg by mouth 3 (three) times daily.    . hydrALAZINE (APRESOLINE) 50 MG tablet Take 50  mg by mouth 2 (two) times daily.     Marland Kitchen lactobacillus (FLORANEX/LACTINEX) PACK Take 1 packet (1 g total) by mouth 3 (three) times daily with  meals. 30 packet 0  . metoprolol tartrate (LOPRESSOR) 25 MG tablet Take 25 mg by mouth daily.    . pantoprazole (PROTONIX) 40 MG tablet Take 1 tablet (40 mg total) by mouth 2 (two) times daily before a meal. 60 tablet 1  . rosuvastatin (CRESTOR) 20 MG tablet Take 20 mg by mouth at bedtime.     Marland Kitchen warfarin (COUMADIN) 7.5 MG tablet Take 1 tablet (7.5 mg total) by mouth one time only at 6 PM. 30 tablet 0  . brompheniramine-pseudoephedrine-DM 30-2-10 MG/5ML syrup Take 5 mLs by mouth 4 (four) times daily as needed. (Patient not taking: Reported on 08/28/2017) 120 mL 0  . cefdinir (OMNICEF) 300 MG capsule Take 1 capsule (300 mg total) by mouth 2 (two) times daily. (Patient not taking: Reported on 08/28/2017) 8 capsule 0  . vancomycin (VANCOCIN) 125 MG capsule Take 1 capsule (125 mg total) by mouth daily. To start after finishing the twice a day prescription (Patient not taking: Reported on 08/28/2017) 24 capsule 0   No current facility-administered medications on file prior to visit.    There are no Patient Instructions on file for this visit. No Follow-up on file.  Winter Jocelyn A Netta Fodge, PA-C

## 2017-08-28 NOTE — ED Notes (Signed)

## 2017-08-28 NOTE — ED Triage Notes (Signed)
Presents with bleeding from left arm at fistula    States area has been bleeding for 2 days   Went to Dialysis but was not dialyzed  Presents to triage with pressure dressing applied

## 2017-08-28 NOTE — ED Provider Notes (Signed)
[facility] Emergency Department Provider Note  ____________________________________________  Time seen: Approximately 3:51 PM  I have reviewed the triage vital signs and the nursing notes.   HISTORY  Chief Complaint Vascular Access Problem    HPI Terry Tyler is a 67 y.o. male that presents to emergency department for evaluation of minor bleed to left fistula site for 2 days.  Patient states that fistula site has been bleeding since dialysis on Monday.  He has had a pressure dressing on and off since Monday.  He went to dialysis this morning and they were unable to do dialysis due to continued slow bleed of site.  Patient came to the ED last night but left due to wait time.  He takes aspirin and warfarin daily for "possible clot."He has not taken his warfarin since Monday.  He denies any additional bleeding.  Past Medical History:  Diagnosis Date  . C. difficile diarrhea   . CKD (chronic kidney disease)   . Coronary atherosclerosis of unspecified type of vessel, native or graft    Myocardial infarction in 2006. Cardiac catheterization showed significant three-vessel coronary artery disease. He underwent CABG at [facility]. Most recent cardiac catheterization in 2010 showed normal LV systolic function, patent grafts to OM1, RCA and LAD. Occluded SVG to diagonal.  . 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 Dialysis patient [facility])    Mon. Wed. Fri. for dialysis  . Hyperlipidemia   . Hypertension   . Hypertension, renal disease   . Impotence of organic origin   . Keloid scar   . Myocardial infarction ([place]) 2006  . Neoplasm of uncertain behavior, site unspecified   . Peripheral vascular disease, unspecified ([place])   . Proteinuria   . Stroke [facility]) 04/2016   TIA  . Unspecified vitamin D deficiency     Patient Active Problem List   Diagnosis Date Noted  . CHF (congestive heart failure) ([place]) 07/16/2017  . GI bleed 05/07/2017  . 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 Hyperbilirubinemia 04/10/2017  . Diarrhea 02/20/2017  . C. difficile diarrhea 02/19/2017  . Diarrhea of presumed infectious origin 11/01/2016  . ESRD on dialysis ([place]) 10/16/2016  . Fatigue 10/02/2016  . Leukocytosis 10/02/2016  . Nausea & vomiting 10/02/2016  . Inferior myocardial infarction ([place]) 09/18/2016  . Elevated troponin 09/08/2016  . CVA (cerebral vascular accident) ([place]) 04/09/2016  . Hyperphosphatemia 08/09/2015  . Secondary hyperparathyroidism ([place]) 08/09/2015  . Chronic kidney disease (CKD) stage G4/A1, severely decreased glomerular filtration rate (GFR) between 15-29 mL/min/1.73 square meter and albuminuria creatinine ratio less than 30 mg/g (HCC) 05/31/2015  . Bilateral carotid artery stenosis 07/24/2014  . Hypertensive heart disease without heart failure 07/24/2014  . Nonrheumatic mitral valve insufficiency 07/24/2014  . Stenosis of carotid artery 07/24/2014  . Anemia 03/30/2013  . Vitamin D deficiency 03/30/2013  . Obesity 08/28/2012  . PAD (peripheral artery disease) ([place]) 04/10/2012  . Coronary atherosclerosis of unspecified type of vessel, native or graft   . Hyperlipidemia   . Hypertension   . Proteinuria 06/19/2010  . Type II diabetes mellitus ([place]) 06/19/2010    Past Surgical History:  Procedure Laterality Date  . A/V FISTULAGRAM Left 01/03/2017   Procedure: A/V Fistulagram;  Surgeon: Algernon Huxley, MD;  Location: [place] [facility];  Service: Cardiovascular;  Laterality: Left;  . A/V FISTULAGRAM Left 06/06/2017   Procedure: A/V FISTULAGRAM;  Surgeon: Algernon Huxley, MD;  Location: [place] [facility];  Service: Cardiovascular;  Laterality: Left;  . A/V SHUNT INTERVENTION N/A 01/03/2017   Procedure: A/V Shunt Intervention;  Surgeon: Algernon Huxley, MD;  Location: [place] [facility];  Service: Cardiovascular;  Laterality: N/A;  . A/V SHUNT INTERVENTION N/A 06/06/2017   Procedure: A/V SHUNT INTERVENTION;  Surgeon:  Algernon Huxley, MD;  Location: [place] [facility];  Service: Cardiovascular;  Laterality: N/A;  . AV FISTULA PLACEMENT Left 10/25/2016   Procedure: ARTERIOVENOUS (AV) FISTULA CREATION ( RADIOCEPHALIC );  Surgeon: Algernon Huxley, MD;  Location: [facility];  Service: Vascular;  Laterality: Left;  . CARDIAC CATHETERIZATION  12/2004   [facility]  . CARDIAC CATHETERIZATION  09/2008   [facility]  . COLONOSCOPY WITH PROPOFOL N/A 05/09/2017   Procedure: COLONOSCOPY WITH PROPOFOL;  Surgeon: Toledo, Benay Pike, MD;  Location: [facility];  Service: Gastroenterology;  Laterality: N/A;  . CORONARY ANGIOPLASTY    . CORONARY ARTERY BYPASS GRAFT  2006   [facility]  . CORONARY STENT INTERVENTION N/A 09/10/2016   Procedure: Coronary Stent Intervention;  Surgeon: Isaias Cowman, MD;  Location: [place] [facility];  Service: Cardiovascular;  Laterality: N/A;  . DIALYSIS/PERMA CATHETER REMOVAL N/A 02/14/2017   Procedure: DIALYSIS/PERMA CATHETER REMOVAL;  Surgeon: Algernon Huxley, MD;  Location: [place] [facility];  Service: Cardiovascular;  Laterality: N/A;  . ESOPHAGOGASTRODUODENOSCOPY (EGD) WITH PROPOFOL N/A 05/08/2017   Procedure: ESOPHAGOGASTRODUODENOSCOPY (EGD) WITH PROPOFOL;  Surgeon: Toledo, Benay Pike, MD;  Location: [facility];  Service: Gastroenterology;  Laterality: N/A;  . keloid removal     right neck  . LEFT HEART CATH AND CORONARY ANGIOGRAPHY N/A 09/10/2016   Procedure: Left Heart Cath and Coronary Angiography;  Surgeon: Isaias Cowman, MD;  Location: [place] [facility];  Service: Cardiovascular;  Laterality: N/A;    Prior to Admission medications   Medication Sig Start Date End Date Taking? Authorizing Provider  aspirin EC 81 MG EC tablet Take 1 tablet (81 mg total) by mouth daily. 07/22/17   Salary, Avel Peace, MD  brompheniramine-pseudoephedrine-DM 30-2-10 MG/5ML syrup Take 5 mLs by mouth 4 (four) times daily as needed. Patient not taking: Reported on 08/28/2017 07/10/17   Johnn Hai, PA-C  cefdinir (OMNICEF) 300 MG capsule Take 1 capsule (300 mg total) by mouth 2 (two) times daily. Patient not taking: Reported on 08/28/2017 07/22/17   Salary, Holly Bodily D, MD  gabapentin (NEURONTIN) 300 MG capsule Take 300 mg by mouth 3 (three) times daily.    [provider]  hydrALAZINE (APRESOLINE) 50 MG tablet Take 50 mg by mouth 2 (two) times daily.     [provider]  lactobacillus (FLORANEX/LACTINEX) PACK Take 1 packet (1 g total) by mouth 3 (three) times daily with meals. 07/22/17   Salary, Avel Peace, MD  metoprolol tartrate (LOPRESSOR) 25 MG tablet Take 25 mg by mouth daily.    [provider]  pantoprazole (PROTONIX) 40 MG tablet Take 1 tablet (40 mg total) by mouth 2 (two) times daily before a meal. 05/10/17   Gladstone Lighter, MD  rosuvastatin (CRESTOR) 20 MG tablet Take 20 mg by mouth at bedtime.     [provider]  vancomycin (VANCOCIN) 125 MG capsule Take 1 capsule (125 mg total) by mouth daily. To start after finishing the twice a day prescription Patient not taking: Reported on 08/28/2017 07/22/17   Salary, Holly Bodily D, MD  warfarin (COUMADIN) 7.5 MG tablet Take 1 tablet (7.5 mg total) by mouth one time only at 6 PM. 07/22/17   Salary,  Avel Peace, MD    Allergies Lisinopril; Atorvastatin calcium; Ezetimibe; and Lipitor [atorvastatin]  Family History  Problem Relation Age of Onset  . Hypertension Mother   . Hyperlipidemia Mother   . Heart disease Mother   . Heart disease Father   . Hyperlipidemia Father   . Hypertension Father   . Kidney disease Sister   . Diabetes Sister   . Hypertension Sister   . Diabetes Brother   . Hypertension Brother   . HIV/AIDS Brother     Social History Social History   Tobacco Use  . Smoking status: Former Smoker    Packs/day: 0.50    Years: 0.00    Pack years: 0.00    Types: Cigarettes    Last attempt to quit: 06/11/1980    Years since quitting: 37.2  . Smokeless tobacco: Never Used  Substance  Use Topics  . Alcohol use: No    Comment: socially  . Drug use: No     Review of Systems  Constitutional: No fever/chills Cardiovascular: No chest pain. Respiratory: No SOB. Gastrointestinal: No abdominal pain.  Musculoskeletal: Negative for musculoskeletal pain. Skin: Negative for rash, abrasions, lacerations, ecchymosis.   ____________________________________________   PHYSICAL EXAM:  VITAL SIGNS: ED Triage Vitals  Enc Vitals Group     BP 08/28/17 1205 122/73     Pulse Rate 08/28/17 1205 90     Resp 08/28/17 1205 18     Temp 08/28/17 1205 98.2 F (36.8 C)     Temp Source 08/28/17 1205 Oral     SpO2 08/28/17 1205 100 %     Weight 08/28/17 1206 200 lb (90.7 kg)     Height 08/28/17 1206 6' (1.829 m)     Head Circumference --      Peak Flow --      Pain Score --      Pain Loc --      Pain Edu? --      Excl. in [place]? --      Constitutional: Alert and oriented. Well appearing and in no acute distress. Eyes: Conjunctivae are normal. PERRL. EOMI. Head: Atraumatic. ENT:      Ears:      Nose: No congestion/rhinnorhea.      Mouth/Throat: Mucous membranes are moist.  Neck: No stridor.  Cardiovascular: Normal rate, regular rhythm.  Good peripheral circulation. Respiratory: Normal respiratory effort without tachypnea or retractions. Lungs CTAB. Good air entry to the bases with no decreased or absent breath sounds. Gastrointestinal: Bowel sounds 4 quadrants. Soft and nontender to palpation. No guarding or rigidity. No palpable masses. No distention.  Musculoskeletal: Full range of motion to all extremities. No gross deformities appreciated. Neurologic:  Normal speech and language. No gross focal neurologic deficits are appreciated.  Skin:  Skin is warm, dry and intact.  Pinpoint bleed from left fistula site.   ____________________________________________   LABS (all labs ordered are listed, but only abnormal results are displayed)  Labs Reviewed  COMPREHENSIVE  METABOLIC PANEL - Abnormal; Notable for the following components:      Result Value   Chloride 97 (*)    Glucose, Bld 157 (*)    BUN 25 (*)    Creatinine, Ser 5.38 (*)    Calcium 8.2 (*)    Albumin 3.0 (*)    AST 57 (*)    Alkaline Phosphatase 355 (*)    Total Bilirubin 3.4 (*)    GFR calc non Af Amer 10 (*)    GFR calc Af Wyvonnia Lora  12 (*)    All other components within normal limits  CBC - Abnormal; Notable for the following components:   RBC 3.78 (*)    Hemoglobin 10.2 (*)    HCT 30.5 (*)    RDW 18.9 (*)    All other components within normal limits  PROTIME-INR - Abnormal; Notable for the following components:   Prothrombin Time 57.1 (*)    INR 6.58 (*)    All other components within normal limits  APTT - Abnormal; Notable for the following components:   aPTT 115 (*)    All other components within normal limits   ____________________________________________  EKG   ____________________________________________  RADIOLOGY   No results found.  ____________________________________________    PROCEDURES  Procedure(s) performed:    Procedures    Medications  lidocaine-EPINEPHrine-tetracaine (LET) solution (3 mLs Topical Not Given 08/28/17 1544)  phytonadione (VITAMIN K) tablet 10 mg (not administered)     ____________________________________________   INITIAL IMPRESSION / ASSESSMENT AND PLAN / ED COURSE  Pertinent labs & imaging results that were available during my care of the patient were reviewed by me and considered in my medical decision making (see chart for details).  Review of the [place] CSRS was performed in accordance of the [place] prior to dispensing any controlled drugs.   Patient presented to the emergency department for evaluation of slow bleed from fistula site.  Vital signs and exam are reassuring.  Bleeding stopped in the ED with LET, surgiseal, and pressure.  He has not had any bleeding in the 4 hours in ED. Pressure dressing left in place.   PT/INR, APTT were discussed with Dr. Joni Fears and Dr. Archie Balboa.  They agree with plan of discharge with  vitamin K administration and follow-up with dialysis in the morning and repeat INR check.    Patient will not take warfarin tonight.  He was given a dose of vitamin K in ED.  He will return to the emergency department for any continued bleeding.  He will follow-up with dialysis and PCP tomorrow.     __________________________________________  FINAL CLINICAL IMPRESSION(S) / ED DIAGNOSES  Final diagnoses:  Bleeding of blood vessel  Warfarin anticoagulation  Elevated INR      NEW MEDICATIONS STARTED DURING THIS VISIT:  ED Discharge Orders    None          This chart was dictated using voice recognition software/Dragon. Despite best efforts to proofread, errors can occur which can change the meaning. Any change was purely unintentional.    Laban Emperor, PA-C 08/28/17 1618    Carrie Mew, MD 08/30/17 [phone]

## 2017-08-28 NOTE — ED Notes (Signed)
FN: pt sent over from Dr Bunnie Domino office for bleeding vascular access. Pt  noted to have a pressure dressing to left arm with no bleeding noted. Daughter of pt states he was here last night for same and waited for six hours and then left.

## 2017-08-28 NOTE — Discharge Instructions (Signed)
Do not take Warfarin tonight. Follow up with dialysis tomorrow. Have INR rechecked tomorrow before restarting warfarin.

## 2017-08-28 NOTE — ED Notes (Addendum)
Pt to STAT desk via w/c, no distress noted, no c/o; family st leaving now & upset due to wait time; informed that he is the next to go back to an exam room; family wants copy of labs; informed that labs would be available thru his chart for viewing & assured labs were reviewed already; also informed that at PT/INR was ordered but not resulted and still needed; family st that she was informed of such but st tired of waiting and will f/u with PCP

## 2017-09-01 MED ORDER — CEFAZOLIN SODIUM-DEXTROSE 1-4 GM/50ML-% IV SOLN: 1.0000 g | Freq: Once | INTRAVENOUS | Status: DC

## 2017-09-02 ENCOUNTER — Other Ambulatory Visit
Admission: RE | Admit: 2017-09-02 | Discharge: 2017-09-02 | Disposition: A | Discharge status: Home / Self Care | Payer: Medicare HMO | Source: Ambulatory Visit | Attending: Gastroenterology | Admitting: Gastroenterology

## 2017-09-02 ENCOUNTER — Ambulatory Visit
Admission: RE | Admit: 2017-09-02 | Discharge: 2017-09-02 | Disposition: A | Discharge status: Home / Self Care | Payer: Medicare HMO | Source: Ambulatory Visit | Attending: Vascular Surgery | Admitting: Vascular Surgery

## 2017-09-02 ENCOUNTER — Encounter
Admission: RE | Disposition: A | Discharge status: Home / Self Care | Payer: Self-pay | Source: Ambulatory Visit | Attending: Vascular Surgery

## 2017-09-02 DIAGNOSIS — I871 Compression of vein: Secondary | ICD-10-CM | POA: Insufficient documentation

## 2017-09-02 DIAGNOSIS — Y832 Surgical operation with anastomosis, bypass or graft as the cause of abnormal reaction of the patient, or of later complication, without mention of misadventure at the time of the procedure: Secondary | ICD-10-CM | POA: Diagnosis not present

## 2017-09-02 DIAGNOSIS — Z7982 Long term (current) use of aspirin: Secondary | ICD-10-CM | POA: Insufficient documentation

## 2017-09-02 DIAGNOSIS — T8241XA Breakdown (mechanical) of vascular dialysis catheter, initial encounter: Secondary | ICD-10-CM | POA: Diagnosis present

## 2017-09-02 DIAGNOSIS — Z992 Dependence on renal dialysis: Secondary | ICD-10-CM | POA: Insufficient documentation

## 2017-09-02 DIAGNOSIS — E1022 Type 1 diabetes mellitus with diabetic chronic kidney disease: Secondary | ICD-10-CM | POA: Diagnosis present

## 2017-09-02 DIAGNOSIS — Z87891 Personal history of nicotine dependence: Secondary | ICD-10-CM | POA: Diagnosis not present

## 2017-09-02 DIAGNOSIS — I120 Hypertensive chronic kidney disease with stage 5 chronic kidney disease or end stage renal disease: Secondary | ICD-10-CM | POA: Diagnosis not present

## 2017-09-02 DIAGNOSIS — N186 End stage renal disease: Secondary | ICD-10-CM | POA: Insufficient documentation

## 2017-09-02 DIAGNOSIS — Z7901 Long term (current) use of anticoagulants: Secondary | ICD-10-CM | POA: Insufficient documentation

## 2017-09-02 DIAGNOSIS — Z79899 Other long term (current) drug therapy: Secondary | ICD-10-CM | POA: Insufficient documentation

## 2017-09-02 DIAGNOSIS — D631 Anemia in chronic kidney disease: Secondary | ICD-10-CM | POA: Diagnosis not present

## 2017-09-02 DIAGNOSIS — E1051 Type 1 diabetes mellitus with diabetic peripheral angiopathy without gangrene: Secondary | ICD-10-CM | POA: Diagnosis not present

## 2017-09-02 DIAGNOSIS — T82868A Thrombosis of vascular prosthetic devices, implants and grafts, initial encounter: Secondary | ICD-10-CM | POA: Diagnosis not present

## 2017-09-02 HISTORY — PX: A/V FISTULAGRAM: CATH118298

## 2017-09-02 LAB — GLUCOSE, CAPILLARY: Glucose-Capillary: 105 mg/dL — ABNORMAL HIGH (ref 65–99)

## 2017-09-02 LAB — POTASSIUM (ARMC VASCULAR LAB ONLY): Potassium (ARMC vascular lab): 4.1 (ref 3.5–5.1)

## 2017-09-02 LAB — GASTROINTESTINAL PANEL BY PCR, STOOL (REPLACES STOOL CULTURE)

## 2017-09-02 LAB — CLOSTRIDIUM DIFFICILE BY PCR, REFLEXED: CDIFFPCR: NEGATIVE

## 2017-09-02 LAB — C DIFFICILE QUICK SCREEN W PCR REFLEX
C DIFFICILE (CDIFF) TOXIN: NEGATIVE
C DIFFICLE (CDIFF) ANTIGEN: POSITIVE — AB

## 2017-09-02 LAB — PROTIME-INR
INR: 1.66
PROTHROMBIN TIME: 19.5 s — AB (ref 11.4–15.2)

## 2017-09-02 SURGERY — A/V FISTULAGRAM
Anesthesia: Moderate Sedation | Laterality: Left

## 2017-09-02 MED ORDER — FAMOTIDINE 20 MG PO TABS: 40.0000 mg | ORAL_TABLET | ORAL | Status: DC | PRN

## 2017-09-02 MED ORDER — FENTANYL CITRATE (PF) 100 MCG/2ML IJ SOLN
INTRAMUSCULAR | Status: AC
  Filled 2017-09-02: qty 2

## 2017-09-02 MED ORDER — MIDAZOLAM HCL 2 MG/2ML IJ SOLN
INTRAMUSCULAR | Status: DC | PRN
  Administered 2017-09-02: 1 mg via INTRAVENOUS
  Administered 2017-09-02: 2 mg via INTRAVENOUS

## 2017-09-02 MED ORDER — HEPARIN SODIUM (PORCINE) 1000 UNIT/ML IJ SOLN
INTRAMUSCULAR | Status: DC | PRN
  Administered 2017-09-02: 3000 [IU] via INTRAVENOUS

## 2017-09-02 MED ORDER — ONDANSETRON HCL 4 MG/2ML IJ SOLN: 4.0000 mg | Freq: Four times a day (QID) | INTRAMUSCULAR | Status: DC | PRN

## 2017-09-02 MED ORDER — HEPARIN (PORCINE) IN NACL 2-0.9 UNIT/ML-% IJ SOLN
INTRAMUSCULAR | Status: AC
  Filled 2017-09-02: qty 1000

## 2017-09-02 MED ORDER — LIDOCAINE-EPINEPHRINE (PF) 1 %-1:200000 IJ SOLN
INTRAMUSCULAR | Status: AC
  Filled 2017-09-02: qty 30

## 2017-09-02 MED ORDER — MIDAZOLAM HCL 5 MG/5ML IJ SOLN
INTRAMUSCULAR | Status: AC
  Filled 2017-09-02: qty 5

## 2017-09-02 MED ORDER — HYDROMORPHONE HCL 1 MG/ML IJ SOLN: 1.0000 mg | Freq: Once | INTRAMUSCULAR | Status: DC | PRN

## 2017-09-02 MED ORDER — HEPARIN SODIUM (PORCINE) 1000 UNIT/ML IJ SOLN
INTRAMUSCULAR | Status: AC
  Filled 2017-09-02: qty 1

## 2017-09-02 MED ORDER — IOPAMIDOL (ISOVUE-300) INJECTION 61%
INTRAVENOUS | Status: DC | PRN
  Administered 2017-09-02: 25 mL via INTRAVENOUS

## 2017-09-02 MED ORDER — FENTANYL CITRATE (PF) 100 MCG/2ML IJ SOLN
INTRAMUSCULAR | Status: DC | PRN
  Administered 2017-09-02: 50 ug via INTRAVENOUS
  Administered 2017-09-02: 25 ug via INTRAVENOUS

## 2017-09-02 MED ORDER — METHYLPREDNISOLONE SODIUM SUCC 125 MG IJ SOLR: 125.0000 mg | INTRAMUSCULAR | Status: DC | PRN

## 2017-09-02 MED ORDER — SODIUM CHLORIDE 0.9 % IV SOLN
INTRAVENOUS | Status: DC
  Administered 2017-09-02: 10:00:00 via INTRAVENOUS

## 2017-09-02 SURGICAL SUPPLY — 14 items
BALLN DORADO 6X40X80 (BALLOONS) ×3
BALLN LUTONIX DCB 5X60X130 (BALLOONS) ×3
BALLOON DORADO 6X40X80 (BALLOONS) ×1 IMPLANT
BALLOON LUTONIX DCB 5X60X130 (BALLOONS) ×1 IMPLANT
CANNULA 5F STIFF (CANNULA) ×3 IMPLANT
CATH BEACON 5 .035 40 KMP TP (CATHETERS) ×1 IMPLANT
CATH BEACON 5 .038 40 KMP TP (CATHETERS) ×2
DEVICE PRESTO INFLATION (MISCELLANEOUS) ×3 IMPLANT
DRAPE BRACHIAL (DRAPES) ×3 IMPLANT
PACK ANGIOGRAPHY (CUSTOM PROCEDURE TRAY) ×3 IMPLANT
SHEATH BRITE TIP 6FRX5.5 (SHEATH) ×3 IMPLANT
SUT MNCRL AB 4-0 PS2 18 (SUTURE) ×3 IMPLANT
TOWEL OR 17X26 4PK STRL BLUE (TOWEL DISPOSABLE) ×3 IMPLANT
WIRE MAGIC TOR.035 180C (WIRE) ×3 IMPLANT

## 2017-09-02 NOTE — Op Note (Signed)
[facility]    OPERATIVE NOTE   PROCEDURE: 1.   Left radiocephalic arteriovenous fistula cannulation under ultrasound guidance 2.   Left arm fistulagram including central venogram 3.   Percutaneous transluminal angioplasty of perianastomotic cephalic vein with 5 mm diameter drug-coated and 6 mm diameter high-pressure angioplasty balloon  PRE-OPERATIVE DIAGNOSIS: 1. ESRD 2. Poorly functional left radiocephalic AVF  POST-OPERATIVE DIAGNOSIS: same as above   SURGEON: Terry Pain, MD  ANESTHESIA: local with MCS  ESTIMATED BLOOD LOSS: 5 cc  FINDING(S): 1. Hyperplastic stenosis of the cephalic vein just beyond the anastomosis creating a 70-80% stenosis.  The remainder of the forearm cephalic vein had no significant stenosis.  The upper arm outflow was dominant through the basilic vein.  The central venous circulation was widely patent.  SPECIMEN(S):  None  CONTRAST: 25 cc  FLUORO TIME: 2.1 minutes  MODERATE CONSCIOUS SEDATION TIME: Approximately 20 minutes with 2 mg of Versed and 50 Mcg of Fentanyl   INDICATIONS: Terry Tyler is a 67 y.o. male who presents with malfunctioning left radiocephalic arteriovenous fistula.  The patient is scheduled for left arm fistulagram.  The patient is aware the risks include but are not limited to: bleeding, infection, thrombosis of the cannulated access, and possible anaphylactic reaction to the contrast.  The patient is aware of the risks of the procedure and elects to proceed forward.  DESCRIPTION: After full informed written consent was obtained, the patient was brought back to the angiography suite and placed supine upon the angiography table.  The patient was connected to monitoring equipment. Moderate conscious sedation was administered with a face to face encounter with the patient throughout the procedure with my supervision of the RN administering medicines and monitoring the patient's vital signs and mental status  throughout from the start of the procedure until the patient was taken to the recovery room. The left arm was prepped and draped in the standard fashion for a percutaneous access intervention.  Under ultrasound guidance, the left radiocephalic arteriovenous fistula was cannulated with a micropuncture needle under direct ultrasound guidance in a retrograde fashion in the proximal forearm and a permanent image was performed.  The microwire was advanced into the fistula and the needle was exchanged for the a microsheath.  I then upsized to a 6 Fr Sheath and imaging was performed.  A Kumpe catheter and a Magic torque wire were advanced to the anastomosis to evaluate the entirety of the fistula.  The wire was removed.  Hand injections were completed to image the access including the central venous system. This demonstrated a hyperplastic stenosis of the cephalic vein just beyond the anastomosis creating a 70-80% stenosis.  The remainder of the forearm cephalic vein had no significant stenosis.  The upper arm outflow was dominant through the basilic vein.  The central venous circulation was widely patent..  Based on the images, this patient will need intervention to this perianastomotic stenosis. I then gave the patient 3000 units of intravenous heparin.  I then crossed the stenosis with a Magic Tourqe wire.  Based on the imaging, a 5 mm x 6 cm Lutonix drug-coated angioplasty balloon was selected.  The balloon was centered around the perianastomotic cephalic vein stenosis and inflated to 12 ATM for 1 minute(s).  This was slightly undersized and a greater than 50% residual stenosis was present.  I then upsized to a 6 mm diameter by 4 cm length high-pressure angioplasty balloon for the perianastomotic cephalic vein.  This was inflated  to 20 atm for 1 minute.  On completion imaging, a 30 % residual stenosis was present.     Based on the completion imaging, no further intervention is necessary.  The wire and balloon were  removed from the sheath.  A 4-0 Monocryl purse-string suture was sewn around the sheath.  The sheath was removed while tying down the suture.  A sterile bandage was applied to the puncture site.  COMPLICATIONS: None  CONDITION: Stable   Terry Tyler  09/02/2017 12:51 PM   This note was created with Dragon Medical transcription system. Any errors in dictation are purely unintentional.

## 2017-09-02 NOTE — H&P (Signed)
[facility] History & Physical Update  The patient was interviewed and re-examined.  The patient's previous History and Physical has been reviewed and is unchanged.  There is no change in the plan of care. We plan to proceed with the scheduled procedure.  [name], MD  09/02/2017, 10:53 AM

## 2017-10-10 ENCOUNTER — Other Ambulatory Visit: Payer: Self-pay

## 2017-10-10 ENCOUNTER — Emergency Department: Payer: Medicare HMO

## 2017-10-10 DIAGNOSIS — I6523 Occlusion and stenosis of bilateral carotid arteries: Secondary | ICD-10-CM | POA: Diagnosis not present

## 2017-10-10 DIAGNOSIS — Z79899 Other long term (current) drug therapy: Secondary | ICD-10-CM | POA: Diagnosis not present

## 2017-10-10 DIAGNOSIS — E1122 Type 2 diabetes mellitus with diabetic chronic kidney disease: Secondary | ICD-10-CM | POA: Insufficient documentation

## 2017-10-10 DIAGNOSIS — Z888 Allergy status to other drugs, medicaments and biological substances status: Secondary | ICD-10-CM | POA: Insufficient documentation

## 2017-10-10 DIAGNOSIS — N186 End stage renal disease: Secondary | ICD-10-CM | POA: Insufficient documentation

## 2017-10-10 DIAGNOSIS — Z86718 Personal history of other venous thrombosis and embolism: Secondary | ICD-10-CM | POA: Insufficient documentation

## 2017-10-10 DIAGNOSIS — Z8249 Family history of ischemic heart disease and other diseases of the circulatory system: Secondary | ICD-10-CM | POA: Insufficient documentation

## 2017-10-10 DIAGNOSIS — Z8619 Personal history of other infectious and parasitic diseases: Secondary | ICD-10-CM | POA: Diagnosis not present

## 2017-10-10 DIAGNOSIS — I739 Peripheral vascular disease, unspecified: Secondary | ICD-10-CM | POA: Diagnosis not present

## 2017-10-10 DIAGNOSIS — I509 Heart failure, unspecified: Secondary | ICD-10-CM | POA: Diagnosis not present

## 2017-10-10 DIAGNOSIS — Y95 Nosocomial condition: Secondary | ICD-10-CM | POA: Insufficient documentation

## 2017-10-10 DIAGNOSIS — R748 Abnormal levels of other serum enzymes: Secondary | ICD-10-CM | POA: Diagnosis not present

## 2017-10-10 DIAGNOSIS — Z7901 Long term (current) use of anticoagulants: Secondary | ICD-10-CM | POA: Diagnosis not present

## 2017-10-10 DIAGNOSIS — Z992 Dependence on renal dialysis: Secondary | ICD-10-CM | POA: Insufficient documentation

## 2017-10-10 DIAGNOSIS — E785 Hyperlipidemia, unspecified: Secondary | ICD-10-CM | POA: Diagnosis not present

## 2017-10-10 DIAGNOSIS — E559 Vitamin D deficiency, unspecified: Secondary | ICD-10-CM | POA: Insufficient documentation

## 2017-10-10 DIAGNOSIS — E1151 Type 2 diabetes mellitus with diabetic peripheral angiopathy without gangrene: Secondary | ICD-10-CM | POA: Diagnosis not present

## 2017-10-10 DIAGNOSIS — Z794 Long term (current) use of insulin: Secondary | ICD-10-CM | POA: Diagnosis not present

## 2017-10-10 DIAGNOSIS — Z8489 Family history of other specified conditions: Secondary | ICD-10-CM | POA: Insufficient documentation

## 2017-10-10 DIAGNOSIS — Z955 Presence of coronary angioplasty implant and graft: Secondary | ICD-10-CM | POA: Diagnosis not present

## 2017-10-10 DIAGNOSIS — Z841 Family history of disorders of kidney and ureter: Secondary | ICD-10-CM | POA: Insufficient documentation

## 2017-10-10 DIAGNOSIS — I340 Nonrheumatic mitral (valve) insufficiency: Secondary | ICD-10-CM | POA: Diagnosis not present

## 2017-10-10 DIAGNOSIS — Z8673 Personal history of transient ischemic attack (TIA), and cerebral infarction without residual deficits: Secondary | ICD-10-CM | POA: Diagnosis not present

## 2017-10-10 DIAGNOSIS — Z7982 Long term (current) use of aspirin: Secondary | ICD-10-CM | POA: Diagnosis not present

## 2017-10-10 DIAGNOSIS — Z87891 Personal history of nicotine dependence: Secondary | ICD-10-CM | POA: Insufficient documentation

## 2017-10-10 DIAGNOSIS — J189 Pneumonia, unspecified organism: Principal | ICD-10-CM | POA: Insufficient documentation

## 2017-10-10 DIAGNOSIS — I132 Hypertensive heart and chronic kidney disease with heart failure and with stage 5 chronic kidney disease, or end stage renal disease: Secondary | ICD-10-CM | POA: Diagnosis not present

## 2017-10-10 DIAGNOSIS — I252 Old myocardial infarction: Secondary | ICD-10-CM | POA: Insufficient documentation

## 2017-10-10 DIAGNOSIS — I2510 Atherosclerotic heart disease of native coronary artery without angina pectoris: Secondary | ICD-10-CM | POA: Insufficient documentation

## 2017-10-10 DIAGNOSIS — Z833 Family history of diabetes mellitus: Secondary | ICD-10-CM | POA: Insufficient documentation

## 2017-10-10 DIAGNOSIS — N2581 Secondary hyperparathyroidism of renal origin: Secondary | ICD-10-CM | POA: Diagnosis not present

## 2017-10-10 LAB — COMPREHENSIVE METABOLIC PANEL
ALT: 24 U/L (ref 17–63)
AST: 57 U/L — AB (ref 15–41)
Albumin: 3.1 g/dL — ABNORMAL LOW (ref 3.5–5.0)
Alkaline Phosphatase: 396 U/L — ABNORMAL HIGH (ref 38–126)
Anion gap: 13 (ref 5–15)
BUN: 22 mg/dL — AB (ref 6–20)
CHLORIDE: 92 mmol/L — AB (ref 101–111)
CO2: 28 mmol/L (ref 22–32)
CREATININE: 5.33 mg/dL — AB (ref 0.61–1.24)
Calcium: 8.1 mg/dL — ABNORMAL LOW (ref 8.9–10.3)
GFR calc Af Amer: 12 mL/min — ABNORMAL LOW (ref >60)
GFR calc non Af Amer: 10 mL/min — ABNORMAL LOW (ref >60)
Glucose, Bld: 122 mg/dL — ABNORMAL HIGH (ref 65–99)
Potassium: 4.9 mmol/L (ref 3.5–5.1)
SODIUM: 133 mmol/L — AB (ref 135–145)
Total Bilirubin: 3 mg/dL — ABNORMAL HIGH (ref 0.3–1.2)
Total Protein: 8.4 g/dL — ABNORMAL HIGH (ref 6.5–8.1)

## 2017-10-10 LAB — CBC
HCT: 34.4 % — ABNORMAL LOW (ref 40.0–52.0)
Hemoglobin: 11.4 g/dL — ABNORMAL LOW (ref 13.0–18.0)
MCH: 26.7 pg (ref 26.0–34.0)
MCHC: 33.1 g/dL (ref 32.0–36.0)
MCV: 80.7 fL (ref 80.0–100.0)
PLATELETS: 154 10*3/uL (ref 150–440)
RBC: 4.26 MIL/uL — AB (ref 4.40–5.90)
RDW: 17 % — ABNORMAL HIGH (ref 11.5–14.5)
WBC: 10.3 10*3/uL (ref 3.8–10.6)

## 2017-10-10 MED ORDER — ACETAMINOPHEN 500 MG PO TABS
ORAL_TABLET | ORAL | Status: AC
  Administered 2017-10-10: 1000 mg via ORAL
  Filled 2017-10-10: qty 2

## 2017-10-10 MED ORDER — ACETAMINOPHEN 500 MG PO TABS
1000.0000 mg | ORAL_TABLET | Freq: Once | ORAL | Status: AC
  Administered 2017-10-10: 1000 mg via ORAL

## 2017-10-10 NOTE — ED Triage Notes (Signed)
Pt in with co weakness and fever since Monday. Pt with co shortness of breath, vomited x 2 and diarrhea x 2 today. Pt without complaints of pain at this time, pt is on dialysis and is due for tx tomorrow.

## 2017-10-11 ENCOUNTER — Observation Stay
Admission: EM | Admit: 2017-10-11 | Discharge: 2017-10-13 | Disposition: A | Discharge status: Home / Self Care | Payer: Medicare HMO | Attending: Internal Medicine | Admitting: Internal Medicine

## 2017-10-11 ENCOUNTER — Encounter: Payer: Self-pay | Admitting: *Deleted

## 2017-10-11 DIAGNOSIS — E119 Type 2 diabetes mellitus without complications: Secondary | ICD-10-CM

## 2017-10-11 DIAGNOSIS — I2510 Atherosclerotic heart disease of native coronary artery without angina pectoris: Secondary | ICD-10-CM | POA: Diagnosis present

## 2017-10-11 DIAGNOSIS — K922 Gastrointestinal hemorrhage, unspecified: Secondary | ICD-10-CM

## 2017-10-11 DIAGNOSIS — J189 Pneumonia, unspecified organism: Secondary | ICD-10-CM

## 2017-10-11 DIAGNOSIS — E785 Hyperlipidemia, unspecified: Secondary | ICD-10-CM | POA: Diagnosis present

## 2017-10-11 DIAGNOSIS — N186 End stage renal disease: Secondary | ICD-10-CM

## 2017-10-11 DIAGNOSIS — I10 Essential (primary) hypertension: Secondary | ICD-10-CM | POA: Diagnosis present

## 2017-10-11 DIAGNOSIS — R0602 Shortness of breath: Secondary | ICD-10-CM

## 2017-10-11 DIAGNOSIS — Z992 Dependence on renal dialysis: Secondary | ICD-10-CM

## 2017-10-11 LAB — CBC
HEMATOCRIT: 32 % — AB (ref 40.0–52.0)
HEMOGLOBIN: 10.9 g/dL — AB (ref 13.0–18.0)
MCH: 27.4 pg (ref 26.0–34.0)
MCHC: 34 g/dL (ref 32.0–36.0)
MCV: 80.7 fL (ref 80.0–100.0)
Platelets: 136 10*3/uL — ABNORMAL LOW (ref 150–440)
RBC: 3.97 MIL/uL — ABNORMAL LOW (ref 4.40–5.90)
RDW: 16.6 % — AB (ref 11.5–14.5)
WBC: 7.8 10*3/uL (ref 3.8–10.6)

## 2017-10-11 LAB — PROTIME-INR
INR: 5.67 — AB
Prothrombin Time: 50.8 seconds — ABNORMAL HIGH (ref 11.4–15.2)

## 2017-10-11 LAB — COMPREHENSIVE METABOLIC PANEL
ALK PHOS: 378 U/L — AB (ref 38–126)
ALT: 20 U/L (ref 17–63)
ANION GAP: 10 (ref 5–15)
AST: 44 U/L — ABNORMAL HIGH (ref 15–41)
Albumin: 3 g/dL — ABNORMAL LOW (ref 3.5–5.0)
BUN: 24 mg/dL — ABNORMAL HIGH (ref 6–20)
CALCIUM: 8 mg/dL — AB (ref 8.9–10.3)
CO2: 29 mmol/L (ref 22–32)
Chloride: 95 mmol/L — ABNORMAL LOW (ref 101–111)
Creatinine, Ser: 5.6 mg/dL — ABNORMAL HIGH (ref 0.61–1.24)
GFR calc non Af Amer: 10 mL/min — ABNORMAL LOW (ref >60)
GFR, EST AFRICAN AMERICAN: 11 mL/min — AB (ref >60)
Glucose, Bld: 131 mg/dL — ABNORMAL HIGH (ref 65–99)
Potassium: 3.8 mmol/L (ref 3.5–5.1)
SODIUM: 134 mmol/L — AB (ref 135–145)
Total Bilirubin: 2.9 mg/dL — ABNORMAL HIGH (ref 0.3–1.2)
Total Protein: 8.3 g/dL — ABNORMAL HIGH (ref 6.5–8.1)

## 2017-10-11 LAB — APTT: APTT: 100 s — AB (ref 24–36)

## 2017-10-11 LAB — GLUCOSE, CAPILLARY
GLUCOSE-CAPILLARY: 176 mg/dL — AB (ref 65–99)
GLUCOSE-CAPILLARY: 142 mg/dL — AB (ref 65–99)
GLUCOSE-CAPILLARY: 100 mg/dL — AB (ref 65–99)

## 2017-10-11 LAB — C DIFFICILE QUICK SCREEN W PCR REFLEX
C Diff antigen: NEGATIVE
C Diff interpretation: NOT DETECTED
C Diff toxin: NEGATIVE

## 2017-10-11 LAB — MRSA PCR SCREENING: MRSA by PCR: NEGATIVE

## 2017-10-11 MED ORDER — VANCOMYCIN HCL IN DEXTROSE 1-5 GM/200ML-% IV SOLN: 1000.0000 mg | INTRAVENOUS | Status: DC | PRN

## 2017-10-11 MED ORDER — INSULIN ASPART 100 UNIT/ML ~~LOC~~ SOLN
0.0000 [IU] | Freq: Three times a day (TID) | SUBCUTANEOUS | Status: DC
  Administered 2017-10-11: 2 [IU] via SUBCUTANEOUS
  Administered 2017-10-11 – 2017-10-12 (×2): 1 [IU] via SUBCUTANEOUS
  Administered 2017-10-13: 2 [IU] via SUBCUTANEOUS
  Filled 2017-10-11 (×4): qty 1

## 2017-10-11 MED ORDER — ROSUVASTATIN CALCIUM 20 MG PO TABS
20.0000 mg | ORAL_TABLET | Freq: Every day | ORAL | Status: DC
Start: 2017-10-11 — End: 2017-10-13
  Administered 2017-10-11 – 2017-10-12 (×2): 20 mg via ORAL
  Filled 2017-10-11: qty 2
  Filled 2017-10-11: qty 1
  Filled 2017-10-11: qty 2
  Filled 2017-10-11: qty 1

## 2017-10-11 MED ORDER — SODIUM CHLORIDE 0.9 % IV SOLN
1.0000 g | INTRAVENOUS | Status: DC
  Administered 2017-10-12: 1 g via INTRAVENOUS
  Filled 2017-10-11 (×2): qty 1

## 2017-10-11 MED ORDER — ONDANSETRON HCL 4 MG PO TABS: 4.0000 mg | ORAL_TABLET | Freq: Four times a day (QID) | ORAL | Status: DC | PRN

## 2017-10-11 MED ORDER — INSULIN ASPART 100 UNIT/ML ~~LOC~~ SOLN: 0.0000 [IU] | Freq: Every day | SUBCUTANEOUS | Status: DC

## 2017-10-11 MED ORDER — VANCOMYCIN HCL IN DEXTROSE 1-5 GM/200ML-% IV SOLN
1000.0000 mg | Freq: Once | INTRAVENOUS | Status: AC
  Administered 2017-10-11: 1000 mg via INTRAVENOUS
  Filled 2017-10-11: qty 200

## 2017-10-11 MED ORDER — VANCOMYCIN HCL IN DEXTROSE 1-5 GM/200ML-% IV SOLN
1000.0000 mg | Freq: Once | INTRAVENOUS | Status: AC
Start: 2017-10-11 — End: 2017-10-11
  Administered 2017-10-11: 1000 mg via INTRAVENOUS
  Filled 2017-10-11: qty 200

## 2017-10-11 MED ORDER — GABAPENTIN 300 MG PO CAPS
300.0000 mg | ORAL_CAPSULE | Freq: Three times a day (TID) | ORAL | Status: DC
  Administered 2017-10-11 – 2017-10-13 (×7): 300 mg via ORAL
  Filled 2017-10-11 (×7): qty 1

## 2017-10-11 MED ORDER — GUAIFENESIN-DM 100-10 MG/5ML PO SYRP: 5.0000 mL | ORAL_SOLUTION | ORAL | Status: DC | PRN

## 2017-10-11 MED ORDER — SODIUM CHLORIDE 0.9 % IV SOLN
2.0000 g | Freq: Once | INTRAVENOUS | Status: AC
  Administered 2017-10-11: 2 g via INTRAVENOUS
  Filled 2017-10-11: qty 2

## 2017-10-11 MED ORDER — SODIUM CHLORIDE 0.9 % IV BOLUS
1000.0000 mL | Freq: Once | INTRAVENOUS | Status: AC
  Administered 2017-10-11: 1000 mL via INTRAVENOUS

## 2017-10-11 MED ORDER — ACETAMINOPHEN 650 MG RE SUPP: 650.0000 mg | Freq: Four times a day (QID) | RECTAL | Status: DC | PRN

## 2017-10-11 MED ORDER — PANTOPRAZOLE SODIUM 40 MG PO TBEC
40.0000 mg | DELAYED_RELEASE_TABLET | Freq: Two times a day (BID) | ORAL | Status: DC
  Administered 2017-10-11 – 2017-10-13 (×4): 40 mg via ORAL
  Filled 2017-10-11 (×4): qty 1

## 2017-10-11 MED ORDER — ONDANSETRON HCL 4 MG/2ML IJ SOLN: 4.0000 mg | Freq: Four times a day (QID) | INTRAMUSCULAR | Status: DC | PRN

## 2017-10-11 MED ORDER — METOPROLOL TARTRATE 25 MG PO TABS
25.0000 mg | ORAL_TABLET | Freq: Every day | ORAL | Status: DC
  Administered 2017-10-11 – 2017-10-13 (×2): 25 mg via ORAL
  Filled 2017-10-11 (×3): qty 1

## 2017-10-11 MED ORDER — HYDRALAZINE HCL 50 MG PO TABS
50.0000 mg | ORAL_TABLET | Freq: Two times a day (BID) | ORAL | Status: DC
  Administered 2017-10-11 – 2017-10-13 (×3): 50 mg via ORAL
  Filled 2017-10-11 (×4): qty 1

## 2017-10-11 MED ORDER — ASPIRIN EC 81 MG PO TBEC
81.0000 mg | DELAYED_RELEASE_TABLET | Freq: Every day | ORAL | Status: DC
  Administered 2017-10-11 – 2017-10-13 (×3): 81 mg via ORAL
  Filled 2017-10-11 (×3): qty 1

## 2017-10-11 MED ORDER — FAMOTIDINE IN NACL 20-0.9 MG/50ML-% IV SOLN
20.0000 mg | Freq: Two times a day (BID) | INTRAVENOUS | Status: DC
  Administered 2017-10-11 (×2): 20 mg via INTRAVENOUS
  Filled 2017-10-11 (×2): qty 50

## 2017-10-11 MED ORDER — VANCOMYCIN HCL IN DEXTROSE 1-5 GM/200ML-% IV SOLN: 1000.0000 mg | INTRAVENOUS | Status: DC

## 2017-10-11 MED ORDER — RENA-VITE PO TABS
1.0000 | ORAL_TABLET | Freq: Every day | ORAL | Status: DC
  Administered 2017-10-11 – 2017-10-12 (×2): 1 via ORAL
  Filled 2017-10-11 (×2): qty 1

## 2017-10-11 MED ORDER — IPRATROPIUM-ALBUTEROL 0.5-2.5 (3) MG/3ML IN SOLN
3.0000 mL | RESPIRATORY_TRACT | Status: DC | PRN
  Administered 2017-10-12: 3 mL via RESPIRATORY_TRACT
  Filled 2017-10-11: qty 3

## 2017-10-11 MED ORDER — VITAMIN C 500 MG PO TABS
500.0000 mg | ORAL_TABLET | Freq: Two times a day (BID) | ORAL | Status: DC
  Administered 2017-10-12 – 2017-10-13 (×3): 500 mg via ORAL
  Filled 2017-10-11 (×5): qty 1

## 2017-10-11 MED ORDER — ACETAMINOPHEN 325 MG PO TABS: 650.0000 mg | ORAL_TABLET | Freq: Four times a day (QID) | ORAL | Status: DC | PRN

## 2017-10-11 NOTE — H&P (Signed)
[facility] NAME: Terry Tyler    MR#:  [id number]  DATE OF BIRTH:  1950-08-13  DATE OF ADMISSION:  10/11/2017  PRIMARY CARE PHYSICIAN: Elisabeth Cara, NP   REQUESTING/REFERRING PHYSICIAN: Joni Fears, MD  CHIEF COMPLAINT:   Chief Complaint  Patient presents with  . Fever  . Weakness    HISTORY OF PRESENT ILLNESS:  Terry Tyler  is a 67 y.o. male who presents with fever, cough productive of yellow sputum.  Patient was admitted here in the hospital in the relatively recent past, and was found here tonight on work-up in the ED to have pneumonia.  He does not meet sepsis criteria.  He was given treatment for healthcare associated pneumonia, and hospitalist were called for admission.  PAST MEDICAL HISTORY:   Past Medical History:  Diagnosis Date  . C. difficile diarrhea   . CKD (chronic kidney disease)   . Coronary atherosclerosis of unspecified type of vessel, native or graft    Myocardial infarction in 2006. Cardiac catheterization showed significant three-vessel coronary artery disease. He underwent CABG at [facility]. Most recent cardiac catheterization in 2010 showed normal LV systolic function, patent grafts to OM1, RCA and LAD. Occluded SVG to diagonal.  . 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 Dialysis patient [facility])    Mon. Wed. Fri. for dialysis  . Hyperlipidemia   . Hypertension   . Hypertension, renal disease   . Impotence of organic origin   . Keloid scar   . Myocardial infarction ([place]) 2006  . Neoplasm of uncertain behavior, site unspecified   . Peripheral vascular disease, unspecified ([place])   . Proteinuria   . Stroke [facility]) 04/2016   TIA  . Unspecified vitamin D deficiency      PAST SURGICAL HISTORY:   Past Surgical History:  Procedure Laterality Date  . A/V FISTULAGRAM Left 01/03/2017   Procedure: A/V Fistulagram;  Surgeon: Algernon Huxley, MD;  Location: [place] [facility];   Service: Cardiovascular;  Laterality: Left;  . A/V FISTULAGRAM Left 06/06/2017   Procedure: A/V FISTULAGRAM;  Surgeon: Algernon Huxley, MD;  Location: [place] [facility] LAB;  Service: Cardiovascular;  Laterality: Left;  . A/V FISTULAGRAM Left 09/02/2017   Procedure: A/V FISTULAGRAM;  Surgeon: Algernon Huxley, MD;  Location: [place] [facility] LAB;  Service: Cardiovascular;  Laterality: Left;  . A/V SHUNT INTERVENTION N/A 01/03/2017   Procedure: A/V Shunt Intervention;  Surgeon: Algernon Huxley, MD;  Location: [place] [facility] LAB;  Service: Cardiovascular;  Laterality: N/A;  . A/V SHUNT INTERVENTION N/A 06/06/2017   Procedure: A/V SHUNT INTERVENTION;  Surgeon: Algernon Huxley, MD;  Location: [place] CV LAB;  Service: Cardiovascular;  Laterality: N/A;  . AV FISTULA PLACEMENT Left 10/25/2016   Procedure: ARTERIOVENOUS (AV) FISTULA CREATION ( RADIOCEPHALIC );  Surgeon: Algernon Huxley, MD;  Location: [facility] ORS;  Service: Vascular;  Laterality: Left;  . CARDIAC CATHETERIZATION  12/2004   [facility]  . CARDIAC CATHETERIZATION  09/2008   [facility]  . COLONOSCOPY WITH PROPOFOL N/A 05/09/2017   Procedure: COLONOSCOPY WITH PROPOFOL;  Surgeon: Toledo, Benay Pike, MD;  Location: [facility] ENDOSCOPY;  Service: Gastroenterology;  Laterality: N/A;  . CORONARY ANGIOPLASTY    . CORONARY ARTERY BYPASS GRAFT  2006   [facility]  . CORONARY STENT INTERVENTION N/A 09/10/2016   Procedure: Coronary Stent Intervention;  Surgeon: Isaias Cowman, MD;  Location: [place] CV LAB;  Service: Cardiovascular;  Laterality: N/A;  . DIALYSIS/PERMA CATHETER REMOVAL N/A 02/14/2017   Procedure: DIALYSIS/PERMA CATHETER REMOVAL;  Surgeon: Algernon Huxley, MD;  Location: [place] CV LAB;  Service: Cardiovascular;  Laterality: N/A;  . ESOPHAGOGASTRODUODENOSCOPY (EGD) WITH PROPOFOL N/A 05/08/2017   Procedure: ESOPHAGOGASTRODUODENOSCOPY (EGD) WITH PROPOFOL;  Surgeon: Toledo, Benay Pike, MD;  Location: [facility] ENDOSCOPY;  Service: Gastroenterology;   Laterality: N/A;  . keloid removal     right neck  . LEFT HEART CATH AND CORONARY ANGIOGRAPHY N/A 09/10/2016   Procedure: Left Heart Cath and Coronary Angiography;  Surgeon: Isaias Cowman, MD;  Location: [place] CV LAB;  Service: Cardiovascular;  Laterality: N/A;     SOCIAL HISTORY:   Social History   Tobacco Use  . Smoking status: Former Smoker    Packs/day: 0.50    Years: 0.00    Pack years: 0.00    Types: Cigarettes    Last attempt to quit: 06/11/1980    Years since quitting: 37.3  . Smokeless tobacco: Never Used  Substance Use Topics  . Alcohol use: No    Comment: socially     FAMILY HISTORY:   Family History  Problem Relation Age of Onset  . Hypertension Mother   . Hyperlipidemia Mother   . Heart disease Mother   . Heart disease Father   . Hyperlipidemia Father   . Hypertension Father   . Kidney disease Sister   . Diabetes Sister   . Hypertension Sister   . Diabetes Brother   . Hypertension Brother   . HIV/AIDS Brother      DRUG ALLERGIES:   Allergies  Allergen Reactions  . Lisinopril Swelling  . Atorvastatin Calcium Other (See Comments)    Muscle cramps   . Ezetimibe Other (See Comments)    Cannot remember what the allergy was.  . Lipitor [Atorvastatin] Other (See Comments)    Muscle cramping    MEDICATIONS AT HOME:   Prior to Admission medications   Medication Sig Start Date End Date Taking? Authorizing Provider  aspirin EC 81 MG EC tablet Take 1 tablet (81 mg total) by mouth daily. 07/22/17   Salary, Avel Peace, MD  gabapentin (NEURONTIN) 300 MG capsule Take 300 mg by mouth 3 (three) times daily.    [provider]  hydrALAZINE (APRESOLINE) 50 MG tablet Take 50 mg by mouth 2 (two) times daily.     [provider]  lactobacillus (FLORANEX/LACTINEX) PACK Take 1 packet (1 g total) by mouth 3 (three) times daily with meals. 07/22/17   Salary, Avel Peace, MD  metoprolol tartrate (LOPRESSOR) 25 MG tablet Take 25 mg by mouth  daily.    [provider]  pantoprazole (PROTONIX) 40 MG tablet Take 1 tablet (40 mg total) by mouth 2 (two) times daily before a meal. 05/10/17   Gladstone Lighter, MD  rosuvastatin (CRESTOR) 20 MG tablet Take 20 mg by mouth at bedtime.     [provider]  warfarin (COUMADIN) 7.5 MG tablet Take 1 tablet (7.5 mg total) by mouth one time only at 6 PM. 07/22/17   Salary, Avel Peace, MD    REVIEW OF SYSTEMS:  Review of Systems  Constitutional: Negative for chills, fever, malaise/fatigue and weight loss.  HENT: Negative for ear pain, hearing loss and tinnitus.   Eyes: Negative for blurred vision, double vision, pain and redness.  Respiratory: Positive for cough, sputum production and shortness of breath. Negative for hemoptysis.   Cardiovascular: Negative for chest pain, palpitations, orthopnea and leg swelling.  Gastrointestinal:  Negative for abdominal pain, constipation, diarrhea, nausea and vomiting.  Genitourinary: Negative for dysuria, frequency and hematuria.  Musculoskeletal: Negative for back pain, joint pain and neck pain.  Skin:       No acne, rash, or lesions  Neurological: Negative for dizziness, tremors, focal weakness and weakness.  Endo/Heme/Allergies: Negative for polydipsia. Does not bruise/bleed easily.  Psychiatric/Behavioral: Negative for depression. The patient is not nervous/anxious and does not have insomnia.      VITAL SIGNS:   Vitals:   10/10/17 2300 10/10/17 2301  BP: 120/72   Pulse: 100   Resp: 18   Temp: (!) 101.1 F (38.4 C)   TempSrc: Oral   SpO2: 92%   Weight:  93 kg (205 lb)   Wt Readings from Last 3 Encounters:  10/10/17 93 kg (205 lb)  09/02/17 88.5 kg (195 lb)  08/28/17 90.7 kg (200 lb)    PHYSICAL EXAMINATION:  Physical Exam  Vitals reviewed. Constitutional: He is oriented to person, place, and time. He appears well-developed and well-nourished. No distress.  HENT:  Head: Normocephalic and atraumatic.  Mouth/Throat:  Oropharynx is clear and moist.  Eyes: Pupils are equal, round, and reactive to light. Conjunctivae and EOM are normal. No scleral icterus.  Neck: Normal range of motion. Neck supple. No JVD present. No thyromegaly present.  Cardiovascular: Normal rate, regular rhythm and intact distal pulses. Exam reveals no gallop and no friction rub.  No murmur heard. Respiratory: Effort normal. No respiratory distress. He has no wheezes. He has no rales.  Coarse breath sounds  GI: Soft. Bowel sounds are normal. He exhibits no distension. There is no tenderness.  Musculoskeletal: Normal range of motion. He exhibits no edema.  No arthritis, no gout  Lymphadenopathy:    He has no cervical adenopathy.  Neurological: He is alert and oriented to person, place, and time. No cranial nerve deficit.  No dysarthria, no aphasia  Skin: Skin is warm and dry. No rash noted. No erythema.  Psychiatric: He has a normal mood and affect. His behavior is normal. Judgment and thought content normal.    LABORATORY PANEL:   CBC Recent Labs  Lab 10/10/17 2316  WBC 10.3  HGB 11.4*  HCT 34.4*  PLT 154   ------------------------------------------------------------------------------------------------------------------  Chemistries  Recent Labs  Lab 10/10/17 2316  NA 133*  K 4.9  CL 92*  CO2 28  GLUCOSE 122*  BUN 22*  CREATININE 5.33*  CALCIUM 8.1*  AST 57*  ALT 24  ALKPHOS 396*  BILITOT 3.0*   ------------------------------------------------------------------------------------------------------------------  Cardiac Enzymes No results for input(s): TROPONINI in the last 168 hours. ------------------------------------------------------------------------------------------------------------------  RADIOLOGY:  Dg Chest 2 View  Result Date: 10/10/2017 CLINICAL DATA:  Cough, weakness, fever EXAM: CHEST - 2 VIEW COMPARISON:  07/20/2017 FINDINGS: Patchy left upper and lower lobe opacities, suspicious for  pneumonia. Right lung is essentially clear. No pleural effusion or pneumothorax. Cardiomegaly.  Postsurgical changes related to prior CABG. Median sternotomy. IMPRESSION: Patchy left upper and lower lobe opacities, suspicious for pneumonia. Electronically Signed   By: Julian Hy M.D.   On: 10/10/2017 23:33    EKG:   Orders placed or performed during the hospital encounter of 07/16/17  . EKG 12-Lead  . EKG 12-Lead    IMPRESSION AND PLAN:  Principal Problem:   HCAP (healthcare-associated pneumonia) -IV antibiotics given, PRN duo nebs and antitussive Active Problems:   CAD (coronary artery disease) -continue home meds   Hypertension -continue home meds   ESRD on dialysis [facility]) -nephrology  consult for dialysis support   Type II diabetes mellitus (HCC) -sliding scale insulin with corresponding glucose checks   Hyperlipidemia -Home dose antilipid  Chart review performed and case discussed with ED provider. Labs, imaging and/or ECG reviewed by provider and discussed with patient/family. Management plans discussed with the patient and/or family.  DVT PROPHYLAXIS: Systemic anticoagulation  GI PROPHYLAXIS: None  ADMISSION STATUS: Inpatient  CODE STATUS: Full Code Status History    Date Active Date Inactive Code Status Order ID Comments User Context   07/16/2017 1133 07/22/2017 2033 Full Code [id number]  Hillary Bow, MD ED   05/07/2017 1320 05/10/2017 2128 Full Code [id number]  Henreitta Leber, MD Inpatient   02/19/2017 1535 02/21/2017 1525 Full Code [id number]  Vaughan Basta, MD Inpatient   01/03/2017 0926 01/03/2017 1426 Full Code [id number]  Algernon Huxley, MD Inpatient   09/08/2016 1758 09/11/2016 1425 Full Code [id number]  Vaughan Basta, MD Inpatient   04/10/2016 0035 04/11/2016 1525 Full Code [id number]  Harvie Bridge, DO Inpatient   04/10/2016 0035 04/10/2016 0035 Full Code [id number]  Hugelmeyer, Ubaldo Glassing, DO Inpatient      TOTAL TIME TAKING CARE OF THIS PATIENT:  45 minutes.   Eldean Nanna Brillion 10/11/2017, 3:03 AM  [facility]  Office  [phone]  CC: Primary care physician; Elisabeth Cara, NP  Note:  This document was prepared using Dragon voice recognition software and may include unintentional dictation errors.

## 2017-10-11 NOTE — Progress Notes (Signed)
ANTICOAGULATION CONSULT NOTE - Initial Consult  Pharmacy Consult for warfarin dosing Indication: CVA  Allergies  Allergen Reactions  . Lisinopril Swelling  . Atorvastatin Calcium Other (See Comments)    Muscle cramps   . Ezetimibe Other (See Comments)    Cannot remember what the allergy was.  . Lipitor [Atorvastatin] Other (See Comments)    Muscle cramping    Patient Measurements: Weight: 205 lb (93 kg) Heparin Dosing Weight: n/a  Vital Signs: Temp: 101.1 F (38.4 C) (05/02 2300) Temp Source: Oral (05/02 2300) BP: 120/72 (05/02 2300) Pulse Rate: 100 (05/02 2300)  Labs: Recent Labs    10/10/17 2316 10/11/17 0253  HGB 11.4*  --   HCT 34.4*  --   PLT 154  --   APTT  --  100*  LABPROT  --  50.8*  INR  --  5.67*  CREATININE 5.33*  --     Estimated Creatinine Clearance: 15.4 mL/min (A) (by C-G formula based on SCr of 5.33 mg/dL (H)).   Medical History: Past Medical History:  Diagnosis Date  . C. difficile diarrhea   . CKD (chronic kidney disease)   . Coronary atherosclerosis of unspecified type of vessel, native or graft    Myocardial infarction in 2006. Cardiac catheterization showed significant three-vessel coronary artery disease. He underwent CABG at [facility]. Most recent cardiac catheterization in 2010 showed normal LV systolic function, patent grafts to OM1, RCA and LAD. Occluded SVG to diagonal.  . 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 Dialysis patient [facility])    Mon. Wed. Fri. for dialysis  . Hyperlipidemia   . Hypertension   . Hypertension, renal disease   . Impotence of organic origin   . Keloid scar   . Myocardial infarction ([place]) 2006  . Neoplasm of uncertain behavior, site unspecified   . Peripheral vascular disease, unspecified ([place])   . Proteinuria   . Stroke [facility]) 04/2016   TIA  . Unspecified vitamin D deficiency     Medications:  Warfarin 7.5 mg daily as home regimen  Assessment: INR supratherapeutic on  admission  Goal of Therapy:  INR 2-3    Plan:  Hold warfarin for now. Daily INR ordered. Will resume when INR at acceptable level.  Shylynn Bruning S 10/11/2017,3:17 AM

## 2017-10-11 NOTE — Progress Notes (Signed)
HD Tx started  

## 2017-10-11 NOTE — Progress Notes (Signed)
Post HD Assessment, pt stable w/o complaint. o change from initial assessment.    10/11/17 2000  Neurological  Level of Consciousness Alert  Orientation Level Oriented X4  Respiratory  Respiratory Pattern Regular  Chest Assessment Chest expansion symmetrical  Cough None  Cardiac  Pulse Regular  Heart Sounds S1, S2  ECG Monitor Yes  Vascular  R Radial Pulse +1  L Radial Pulse +1  Psychosocial  Psychosocial (WDL) WDL

## 2017-10-11 NOTE — Care Management (Signed)
Amanda Morris dialysis liaison notified of admission.    

## 2017-10-11 NOTE — Progress Notes (Signed)
HD Tx ended    10/11/17 1945  Vital Signs  Pulse Rate (!) 101  Pulse Rate Source Monitor  Resp 13  BP 130/75  BP Location Right Arm  BP Method Automatic  Patient Position (if appropriate) Lying  Oxygen Therapy  SpO2 100 %  During Hemodialysis Assessment  Blood Flow Rate (mL/min) 400 mL/min  Arterial Pressure (mmHg) -180 mmHg  Venous Pressure (mmHg) 220 mmHg  Transmembrane Pressure (mmHg) 80 mmHg  Ultrafiltration Rate (mL/min) 1170 mL/min  Dialysate Flow Rate (mL/min) 800 ml/min  Conductivity: Machine  14  HD Safety Checks Performed Yes  Dialysis Fluid Bolus Normal Saline  Bolus Amount (mL) 250 mL  Intra-Hemodialysis Comments Tx completed;Tolerated well  Fistula / Graft Left Forearm Arteriovenous fistula  No Placement Date or Time found.   Orientation: Left  Access Location: Forearm  Access Type: Arteriovenous fistula  Fistula / Graft Assessment Present;Thrill;Bruit  Status Deaccessed  Drainage Description None

## 2017-10-11 NOTE — Progress Notes (Signed)
Initial Nutrition Assessment  DOCUMENTATION CODES:   Not applicable  INTERVENTION:   Pt with a confirmed Vitamin C deficiency Nov. 2018 and he has not been supplementing: Recommend Vitamin C 500 mg BID  Magic cup TID with meals, each supplement provides 290 kcal and 9 grams of protein (mixed berry)  Recommend liberalize diet to just renal, 1200 mL  NUTRITION DIAGNOSIS:   Increased nutrient needs related to chronic illness(ESRD on dialysis, CAD) as evidenced by increased estimated needs.  GOAL:   Patient will meet greater than or equal to 90% of their needs  MONITOR:   PO intake, Supplement acceptance, Labs, I & O's, Weight trends  REASON FOR ASSESSMENT:   Malnutrition Screening Tool   ASSESSMENT:  67 y.o. male with a history of ESRD on dialysis, CAD, HLD, HTN, diabetes who presents with shortness of breath and productive cough, worsening for the past week. He was treated for C. difficile colitis about a month ago, he reports that he is still having diarrhea. Admitted with HCAP.   Met with pt in room today. Patient's breakfast tray was on side table with 100% consumed. Pt reports a good appetite now. He states poor appetite and po intake for the past two days d/t nausea, vomiting, and diarrhea. Pt unsure if he's had a BM today. Prior to GI issues, pt reports good intake at home consuming 2-3 meals/day. He reports consuming protein at every meal.  Per chart, pt with stable weight with minor fluctuations for the past few months.  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 Pt states he does not take Vitamin C supplements at home. He reports taking rena-vite daily at home. Pt reports no easy bruising or bleeding gums. Noted pt with GI bleed. Recommend Vitamin C 500 mg BID.   Pt reports receiving Nepro at Dialysis center but he doesn't like it. He is not willing to try other supplements at time d/t GI issues. Pt is willing to try Magic cup (wild  berry). Pt was not interested in receiving double protein at meals.  Pt reports going to all dialysis treatments. He cannot recall renal lab values.  Pt is almost edentulous but reports no problems chewing or swallowing at this time.  Noted pt with mild edema on bilateral lower extremities. Unable to assess lower extremities in nutrition focused physical exam.  Medications reviewed and include: aspirin, gabapentin, hydralazine, novolog, metoprolol, pantoprazole, rosuvastatin, cefeplme,   Labs reviewed: Na 134(L), BUN 24(H), Creatinine(H), Albumin 3(L), GFR 11(L), INR 5.67(H), Glucose 122, 131 x 24 hrs   NUTRITION - FOCUSED PHYSICAL EXAM:    Most Recent Value  Orbital Region  Mild depletion  Upper Arm Region  Moderate depletion  Thoracic and Lumbar Region  No depletion  Buccal Region  No depletion  Temple Region  Mild depletion  Clavicle Bone Region  No depletion  Clavicle and Acromion Bone Region  No depletion  Scapular Bone Region  No depletion  Dorsal Hand  No depletion  Patellar Region  Unable to assess  Anterior Thigh Region  Unable to assess  Posterior Calf Region  Unable to assess  Edema (RD Assessment)  Mild  Hair  Reviewed  Eyes  Reviewed  Mouth  Reviewed  Skin  Reviewed  Nails  Reviewed       Diet Order:   Diet Order           Diet renal/carb modified with fluid restriction Diet-HS Snack? Nothing; Fluid restriction: 1200  mL Fluid; Room service appropriate? Yes; Fluid consistency: Thin  Diet effective now          EDUCATION NEEDS:   Education needs have been addressed  Skin:  Skin Assessment: Reviewed RN Assessment  Last BM:  10/11/17- Type 7(per nurse)  Height:   Ht Readings from Last 1 Encounters:  10/11/17 6' (1.829 m)    Weight:   Wt Readings from Last 1 Encounters:  10/11/17 204 lb (92.5 kg)    Ideal Body Weight:  81 kg  BMI:  Body mass index is 27.67 kg/m.  Estimated Nutritional Needs:   Kcal:  2200-2600 kcal (MSJ ABW x  1.3-1.5)  Protein:  111-130 gm/day (ABW x 1.2-1.4)  Fluid:  1200 mL (fluid restriction)  Alfonse Ras, [place] Dietetic Intern [phone]

## 2017-10-11 NOTE — Progress Notes (Signed)
Pharmacy Antibiotic Note  Terry Tyler is a 67 y.o. male admitted on 10/11/2017 with pneumonia.  Pharmacy has been consulted for vancomycin and cefepime dosing.  Plan: Vancomycin 2 grams total first dose. ! Gram every HD  Cefepime 1 gram daily ordered  Weight: 205 lb (93 kg)  Temp (24hrs), Avg:101.1 F (38.4 C), Min:101.1 F (38.4 C), Max:101.1 F (38.4 C)  Recent Labs  Lab 10/10/17 2316  WBC 10.3  CREATININE 5.33*    Estimated Creatinine Clearance: 15.4 mL/min (A) (by C-G formula based on SCr of 5.33 mg/dL (H)).    Allergies  Allergen Reactions  . Lisinopril Swelling  . Atorvastatin Calcium Other (See Comments)    Muscle cramps   . Ezetimibe Other (See Comments)    Cannot remember what the allergy was.  . Lipitor [Atorvastatin] Other (See Comments)    Muscle cramping    Antimicrobials this admission: Vancomycin cefepime 5/3 >>    >>   Dose adjustments this admission:   Microbiology results: No micro      5/2 CXR: upper and lower lobe opacities Thank you for allowing pharmacy to be a part of this patient's care.  Brandyn Thien S 10/11/2017 3:13 AM

## 2017-10-11 NOTE — Progress Notes (Signed)
[facility] [place] NAME: Laurie Lovejoy    MR#:  [id number]  DATE OF BIRTH:  12/16/1950  SUBJECTIVE:  came in with increasing shortness of breath and weakness with fever found to have left upper lobe pneumonia.  REVIEW OF SYSTEMS:   Review of Systems  Constitutional: Positive for malaise/fatigue. Negative for chills, fever and weight loss.  HENT: Negative for ear discharge, ear pain and nosebleeds.   Eyes: Negative for blurred vision, pain and discharge.  Respiratory: Positive for cough and shortness of breath. Negative for sputum production, wheezing and stridor.   Cardiovascular: Negative for chest pain, palpitations, orthopnea and PND.  Gastrointestinal: Negative for abdominal pain, diarrhea, nausea and vomiting.  Genitourinary: Negative for frequency and urgency.  Musculoskeletal: Negative for back pain and joint pain.  Neurological: Positive for weakness. Negative for sensory change, speech change and focal weakness.  Psychiatric/Behavioral: Negative for depression and hallucinations. The patient is not nervous/anxious.    Tolerating Diet:yes Tolerating PT: ambulatory  DRUG ALLERGIES:   Allergies  Allergen Reactions  . Lisinopril Swelling  . Atorvastatin Calcium Other (See Comments)    Muscle cramps   . Ezetimibe Other (See Comments)    Cannot remember what the allergy was.  . Lipitor [Atorvastatin] Other (See Comments)    Muscle cramping    VITALS:  Blood pressure (!) 149/82, pulse 90, temperature (!) 97.5 F (36.4 C), temperature source Oral, resp. rate 19, height 6' (1.829 m), weight 92.5 kg (204 lb), SpO2 97 %.  PHYSICAL EXAMINATION:   Physical Exam  GENERAL:  67 y.o.-year-old patient lying in the bed with no acute distress.  EYES: Pupils equal, round, reactive to light and accommodation. No scleral icterus. Extraocular muscles intact.  HEENT: Head atraumatic, normocephalic. Oropharynx and nasopharynx clear.   NECK:  Supple, no jugular venous distention. No thyroid enlargement, no tenderness.  LUNGS: Normal breath sounds bilaterally, no wheezing, rales, rhonchi. No use of accessory muscles of respiration.  CARDIOVASCULAR: S1, S2 normal. No murmurs, rubs, or gallops.  ABDOMEN: Soft, nontender, nondistended. Bowel sounds present. No organomegaly or mass.  EXTREMITIES: No cyanosis, clubbing or edema b/l.    NEUROLOGIC: Cranial nerves II through XII are intact. No focal Motor or sensory deficits b/l.   PSYCHIATRIC:  patient is alert and oriented x 3.  SKIN: No obvious rash, lesion, or ulcer.   LABORATORY PANEL:  CBC Recent Labs  Lab 10/11/17 0529  WBC 7.8  HGB 10.9*  HCT 32.0*  PLT 136*    Chemistries  Recent Labs  Lab 10/11/17 0529  NA 134*  K 3.8  CL 95*  CO2 29  GLUCOSE 131*  BUN 24*  CREATININE 5.60*  CALCIUM 8.0*  AST 44*  ALT 20  ALKPHOS 378*  BILITOT 2.9*   Cardiac Enzymes No results for input(s): TROPONINI in the last 168 hours. RADIOLOGY:  Dg Chest 2 View  Result Date: 10/10/2017 CLINICAL DATA:  Cough, weakness, fever EXAM: CHEST - 2 VIEW COMPARISON:  07/20/2017 FINDINGS: Patchy left upper and lower lobe opacities, suspicious for pneumonia. Right lung is essentially clear. No pleural effusion or pneumothorax. Cardiomegaly.  Postsurgical changes related to prior CABG. Median sternotomy. IMPRESSION: Patchy left upper and lower lobe opacities, suspicious for pneumonia. Electronically Signed   By: Julian Hy M.D.   On: 10/10/2017 23:33   ASSESSMENT AND PLAN:  Sadiq Mccauley  is a 67 y.o. male who presents with fever, cough productive of yellow sputum.  Patient was admitted  here in the hospital in the relatively recent past, and was found here tonight on work-up in the ED to have pneumonia.   * HCAP (healthcare-associated pneumonia)  -IV antibiotics given, PRN duo nebs and antitussive -BC neg so far -MRSA negative -IV cefepime  -WBC normal, afebrile  *  CAD  (coronary artery disease) -continue home meds -cont asa, metoprolol, and crestor  *  Hypertension -continue home meds  *  ESRD on dialysis [facility]) -nephrology consult for dialysis support  *  Type II diabetes mellitus (HCC) -sliding scale insulin with corresponding glucose checks   * Hyperlipidemia -Home dose antilipid  Patient is ambulatory at home. He was discharged to home when ready.  Case discussed with Care Management/Social Worker. Management plans discussed with the patient, family and they are in agreement.  CODE STATUS: full  DVT Prophylaxis: heparin  TOTAL TIME TAKING CARE OF THIS PATIENT: *30* minutes.  >50% time spent on counselling and coordination of care  POSSIBLE D/C IN *1-2* DAYS, DEPENDING ON CLINICAL CONDITION.  Note: This dictation was prepared with Dragon dictation along with smaller phrase technology. Any transcriptional errors that result from this process are unintentional.  Fritzi Mandes M.D on 10/11/2017 at 3:15 PM  Between 7am to 6pm - Pager - [phone]  After 6pm go to [email or web address] - password [email or web address] [facility]  Office  [phone]  CC: Primary care physician; Elisabeth Cara, NPPatient ID: Hulen Skains, male   DOB: 1950/12/30, 67 y.o.   MRN: [id number]

## 2017-10-11 NOTE — ED Provider Notes (Signed)
[facility] Emergency Department Provider Note  ____________________________________________  Time seen: Approximately 2:48 AM  I have reviewed the triage vital signs and the nursing notes.   HISTORY  Chief Complaint Fever and Weakness    HPI Terry Tyler is a 67 y.o. male with a history of end-stage renal disease on dialysis, hypertension, diabetes who presents with shortness of breath and productive cough, worsening for the past week. Constant, no aggravating or alleviating factors, moderate severity. Also has chills at home. Has also noticed that his stool was black. He was treated for C. difficile colitis about a month ago, he reports that he is still having diarrhea. Denies abdominal pain.   patient does take Coumadin which she reports is due to a suspected blood clot in his leg.   Past Medical History:  Diagnosis Date  . C. difficile diarrhea   . CKD (chronic kidney disease)   . Coronary atherosclerosis of unspecified type of vessel, native or graft    Myocardial infarction in 2006. Cardiac catheterization showed significant three-vessel coronary artery disease. He underwent CABG at [facility]. Most recent cardiac catheterization in 2010 showed normal LV systolic function, patent grafts to OM1, RCA and LAD. Occluded SVG to diagonal.  . 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 Dialysis patient [facility])    Mon. Wed. Fri. for dialysis  . Hyperlipidemia   . Hypertension   . Hypertension, renal disease   . Impotence of organic origin   . Keloid scar   . Myocardial infarction ([place]) 2006  . Neoplasm of uncertain behavior, site unspecified   . Peripheral vascular disease, unspecified ([place])   . Proteinuria   . Stroke [facility]) 04/2016   TIA  . Unspecified vitamin D deficiency      Patient Active Problem List   Diagnosis Date Noted  . CHF (congestive heart failure) ([place]) 07/16/2017  . GI bleed 05/07/2017  . 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 Hyperbilirubinemia 04/10/2017  . Diarrhea 02/20/2017  . C. difficile diarrhea 02/19/2017  . Diarrhea of presumed infectious origin 11/01/2016  . ESRD on dialysis ([place]) 10/16/2016  . Fatigue 10/02/2016  . Leukocytosis 10/02/2016  . Nausea & vomiting 10/02/2016  . Inferior myocardial infarction ([place]) 09/18/2016  . Elevated troponin 09/08/2016  . CVA (cerebral vascular accident) ([place]) 04/09/2016  . Hyperphosphatemia 08/09/2015  . Secondary hyperparathyroidism ([place]) 08/09/2015  . Chronic kidney disease (CKD) stage G4/A1, severely decreased glomerular filtration rate (GFR) between 15-29 mL/min/1.73 square meter and albuminuria creatinine ratio less than 30 mg/g (HCC) 05/31/2015  . Bilateral carotid artery stenosis 07/24/2014  . Hypertensive heart disease without heart failure 07/24/2014  . Nonrheumatic mitral valve insufficiency 07/24/2014  . Stenosis of carotid artery 07/24/2014  . Anemia 03/30/2013  . Vitamin D deficiency 03/30/2013  . Obesity 08/28/2012  . PAD (peripheral artery disease) ([place]) 04/10/2012  . Coronary atherosclerosis of unspecified type of vessel, native or graft   . Hyperlipidemia   . Hypertension   . Proteinuria 06/19/2010  . Type II diabetes mellitus ([place]) 06/19/2010     Past Surgical History:  Procedure Laterality Date  . A/V FISTULAGRAM Left 01/03/2017   Procedure: A/V Fistulagram;  Surgeon: Algernon Huxley, MD;  Location: [place] CV LAB;  Service: Cardiovascular;  Laterality: Left;  . A/V FISTULAGRAM Left 06/06/2017   Procedure: A/V FISTULAGRAM;  Surgeon: Algernon Huxley, MD;  Location: [place] CV LAB;  Service: Cardiovascular;  Laterality: Left;  . A/V FISTULAGRAM  Left 09/02/2017   Procedure: A/V FISTULAGRAM;  Surgeon: Algernon Huxley, MD;  Location: [place] CV LAB;  Service: Cardiovascular;  Laterality: Left;  . A/V SHUNT INTERVENTION N/A 01/03/2017   Procedure: A/V Shunt Intervention;  Surgeon: Algernon Huxley, MD;   Location: [place] CV LAB;  Service: Cardiovascular;  Laterality: N/A;  . A/V SHUNT INTERVENTION N/A 06/06/2017   Procedure: A/V SHUNT INTERVENTION;  Surgeon: Algernon Huxley, MD;  Location: [place] CV LAB;  Service: Cardiovascular;  Laterality: N/A;  . AV FISTULA PLACEMENT Left 10/25/2016   Procedure: ARTERIOVENOUS (AV) FISTULA CREATION ( RADIOCEPHALIC );  Surgeon: Algernon Huxley, MD;  Location: [facility] ORS;  Service: Vascular;  Laterality: Left;  . CARDIAC CATHETERIZATION  12/2004   [facility]  . CARDIAC CATHETERIZATION  09/2008   [facility]  . COLONOSCOPY WITH PROPOFOL N/A 05/09/2017   Procedure: COLONOSCOPY WITH PROPOFOL;  Surgeon: Toledo, Benay Pike, MD;  Location: [facility] ENDOSCOPY;  Service: Gastroenterology;  Laterality: N/A;  . CORONARY ANGIOPLASTY    . CORONARY ARTERY BYPASS GRAFT  2006   [facility]  . CORONARY STENT INTERVENTION N/A 09/10/2016   Procedure: Coronary Stent Intervention;  Surgeon: Isaias Cowman, MD;  Location: [place] CV LAB;  Service: Cardiovascular;  Laterality: N/A;  . DIALYSIS/PERMA CATHETER REMOVAL N/A 02/14/2017   Procedure: DIALYSIS/PERMA CATHETER REMOVAL;  Surgeon: Algernon Huxley, MD;  Location: [place] CV LAB;  Service: Cardiovascular;  Laterality: N/A;  . ESOPHAGOGASTRODUODENOSCOPY (EGD) WITH PROPOFOL N/A 05/08/2017   Procedure: ESOPHAGOGASTRODUODENOSCOPY (EGD) WITH PROPOFOL;  Surgeon: Toledo, Benay Pike, MD;  Location: [facility] ENDOSCOPY;  Service: Gastroenterology;  Laterality: N/A;  . keloid removal     right neck  . LEFT HEART CATH AND CORONARY ANGIOGRAPHY N/A 09/10/2016   Procedure: Left Heart Cath and Coronary Angiography;  Surgeon: Isaias Cowman, MD;  Location: [place] CV LAB;  Service: Cardiovascular;  Laterality: N/A;     Prior to Admission medications   Medication Sig Start Date End Date Taking? Authorizing Provider  aspirin EC 81 MG EC tablet Take 1 tablet (81 mg total) by mouth daily. 07/22/17   Salary, Avel Peace, MD   brompheniramine-pseudoephedrine-DM 30-2-10 MG/5ML syrup Take 5 mLs by mouth 4 (four) times daily as needed. Patient not taking: Reported on 08/28/2017 07/10/17   Johnn Hai, PA-C  cefdinir (OMNICEF) 300 MG capsule Take 1 capsule (300 mg total) by mouth 2 (two) times daily. Patient not taking: Reported on 08/28/2017 07/22/17   Salary, Holly Bodily D, MD  gabapentin (NEURONTIN) 300 MG capsule Take 300 mg by mouth 3 (three) times daily.    [provider]  hydrALAZINE (APRESOLINE) 50 MG tablet Take 50 mg by mouth 2 (two) times daily.     [provider]  lactobacillus (FLORANEX/LACTINEX) PACK Take 1 packet (1 g total) by mouth 3 (three) times daily with meals. 07/22/17   Salary, Avel Peace, MD  metoprolol tartrate (LOPRESSOR) 25 MG tablet Take 25 mg by mouth daily.    [provider]  pantoprazole (PROTONIX) 40 MG tablet Take 1 tablet (40 mg total) by mouth 2 (two) times daily before a meal. 05/10/17   Gladstone Lighter, MD  rosuvastatin (CRESTOR) 20 MG tablet Take 20 mg by mouth at bedtime.     [provider]  vancomycin (VANCOCIN) 125 MG capsule Take 1 capsule (125 mg total) by mouth daily. To start after finishing the twice a day prescription Patient not taking: Reported on 08/28/2017 07/22/17   Salary, Avel Peace, MD  warfarin (COUMADIN)  7.5 MG tablet Take 1 tablet (7.5 mg total) by mouth one time only at 6 PM. 07/22/17   Salary, Avel Peace, MD     Allergies Lisinopril; Atorvastatin calcium; Ezetimibe; and Lipitor [atorvastatin]   Family History  Problem Relation Age of Onset  . Hypertension Mother   . Hyperlipidemia Mother   . Heart disease Mother   . Heart disease Father   . Hyperlipidemia Father   . Hypertension Father   . Kidney disease Sister   . Diabetes Sister   . Hypertension Sister   . Diabetes Brother   . Hypertension Brother   . HIV/AIDS Brother     Social History Social History   Tobacco Use  . Smoking status: Former Smoker     Packs/day: 0.50    Years: 0.00    Pack years: 0.00    Types: Cigarettes    Last attempt to quit: 06/11/1980    Years since quitting: 37.3  . Smokeless tobacco: Never Used  Substance Use Topics  . Alcohol use: No    Comment: socially  . Drug use: No    Review of Systems  Constitutional:   No fever or chills.  ENT:   No sore throat. No rhinorrhea. Cardiovascular:   No chest pain or syncope. Respiratory:   positive shortness of breath and cough. Gastrointestinal:   Negative for abdominal pain, positive for diarrhea and black stool.  Musculoskeletal:   Negative for focal pain or swelling All other systems reviewed and are negative except as documented above in ROS and HPI.  ____________________________________________   PHYSICAL EXAM:  VITAL SIGNS: ED Triage Vitals  Enc Vitals Group     BP 10/10/17 2300 120/72     Pulse Rate 10/10/17 2300 100     Resp 10/10/17 2300 18     Temp 10/10/17 2300 (!) 101.1 F (38.4 C)     Temp Source 10/10/17 2300 Oral     SpO2 10/10/17 2300 92 %     Weight 10/10/17 2301 205 lb (93 kg)     Height --      Head Circumference --      Peak Flow --      Pain Score --      Pain Loc --      Pain Edu? --      Excl. in [place]? --     Vital signs reviewed, nursing assessments reviewed.   Constitutional:   Alert and oriented. ill-appearing, not in distress. Eyes:   Conjunctivae are normal. EOMI. PERRL. ENT      Head:   Normocephalic and atraumatic.      Nose:   No congestion/rhinnorhea.       Mouth/Throat:   dry mucous membranes, no pharyngeal erythema. No peritonsillar mass.       Neck:   No meningismus. Full ROM. Hematological/Lymphatic/Immunilogical:   No cervical lymphadenopathy. Cardiovascular:   RRR. Symmetric bilateral radial and DP pulses.  No murmurs. left forearm AV fistula dialysis access has 2 punctate bleeding sites with very slow oozing. Strong pulse and thrill. Respiratory:   Normal respiratory effort without tachypnea/retractions.  diffuse left-sided crackles Gastrointestinal:   Soft and nontender. Non distended. There is no CVA tenderness.  No rebound, rigidity, or guarding.rectal exam reveals black stool, strongly Hemoccult positive.  Musculoskeletal:   Normal range of motion in all extremities. No joint effusions.  No lower extremity tenderness.  No edema. Neurologic:   Normal speech and language.  Motor grossly intact. No acute focal neurologic deficits  are appreciated.  Skin:    Skin is warm, dry and intact. No rash noted.  No petechiae, purpura, or bullae.  ____________________________________________    LABS (pertinent positives/negatives) (all labs ordered are listed, but only abnormal results are displayed) Labs Reviewed  CBC - Abnormal; Notable for the following components:      Result Value   RBC 4.26 (*)    Hemoglobin 11.4 (*)    HCT 34.4 (*)    RDW 17.0 (*)    All other components within normal limits  COMPREHENSIVE METABOLIC PANEL - Abnormal; Notable for the following components:   Sodium 133 (*)    Chloride 92 (*)    Glucose, Bld 122 (*)    BUN 22 (*)    Creatinine, Ser 5.33 (*)    Calcium 8.1 (*)    Total Protein 8.4 (*)    Albumin 3.1 (*)    AST 57 (*)    Alkaline Phosphatase 396 (*)    Total Bilirubin 3.0 (*)    GFR calc non Af Amer 10 (*)    GFR calc Af Amer 12 (*)    All other components within normal limits  C DIFFICILE QUICK SCREEN W PCR REFLEX  PROTIME-INR  APTT   ____________________________________________   EKG    ____________________________________________    RADIOLOGY  Dg Chest 2 View  Result Date: 10/10/2017 CLINICAL DATA:  Cough, weakness, fever EXAM: CHEST - 2 VIEW COMPARISON:  07/20/2017 FINDINGS: Patchy left upper and lower lobe opacities, suspicious for pneumonia. Right lung is essentially clear. No pleural effusion or pneumothorax. Cardiomegaly.  Postsurgical changes related to prior CABG. Median sternotomy. IMPRESSION: Patchy left upper and lower lobe  opacities, suspicious for pneumonia. Electronically Signed   By: Julian Hy M.D.   On: 10/10/2017 23:33    ____________________________________________   PROCEDURES .Critical Care Performed by: Carrie Mew, MD Authorized by: Carrie Mew, MD   Critical care provider statement:    Critical care time (minutes):  30   Critical care was necessary to treat or prevent imminent or life-threatening deterioration of the following conditions:  Shock   Critical care was time spent personally by me on the following activities:  Ordering and performing treatments and interventions, ordering and review of laboratory studies, ordering and review of radiographic studies, pulse oximetry, re-evaluation of patient's condition, obtaining history from patient or surrogate, examination of patient and development of treatment plan with patient or surrogate   Bleeding AV fistula treated with Surgicel application, nonadherent Telfa dressing, Kerlix wrap. ____________________________________________   CLINICAL IMPRESSION / ASSESSMENT AND PLAN / ED COURSE  Pertinent labs & imaging results that were available during my care of the patient were reviewed by me and considered in my medical decision making (see chart for details).      Clinical Course as of Oct 12 246  Fri Oct 11, 2017  0119 Labs all at chronic baseline. P/w fever, pneumonia. With recent procedures and hospitalization, c/w HCAP. Will start broad spectrum abx, plan to admit. Pt is not septic. Exam does reveal GI bleed and persistent oozing bleed from AV fistula punctures. Suspect high INR, will check coags.    [PS]  [id number] Lab needs recollect of coags.    [PS]    Clinical Course User Index [PS] Carrie Mew, MD    ----------------------------------------- 2:56 AM on 10/11/2017 -----------------------------------------  IV Protonix as on critical shortage. IV Pepcid ordered for this patient's GI bleed given stable  vital signs and hemoglobin level. Bleeding AV fistula treated.  We'll follow-up coags regarding possible need for Coumadin reversal. Case discussed with the hospitalist for further management of healthcare associated pneumonia.   ____________________________________________   FINAL CLINICAL IMPRESSION(S) / ED DIAGNOSES    Final diagnoses:  HCAP (healthcare-associated pneumonia)  Gastrointestinal hemorrhage, unspecified gastrointestinal hemorrhage type  End-stage renal disease on hemodialysis [facility])     ED Discharge Orders    None      Portions of this note were generated with dragon dictation software. Dictation errors may occur despite best attempts at proofreading.    Carrie Mew, MD 10/11/17 [phone]

## 2017-10-11 NOTE — Progress Notes (Signed)
[facility], [place] 10/11/17  Subjective:   Patient is known to our practice from previous admissions This time he presents for shortness of breath, fever, cough, sputum production for about a week since Monday Diagnosed with pneumonia.  Admitted for further management Patient also reports excessive bleeding from AV fistula since Wednesday.  Patient is on Coumadin and INR is high.  Objective:  Vital signs in last 24 hours:  Temp:  [97.5 F (36.4 C)-101.1 F (38.4 C)] 97.5 F (36.4 C) (05/03 1136) Pulse Rate:  [90-100] 90 (05/03 1136) Resp:  [18-20] 19 (05/03 1136) BP: (120-149)/(72-85) 149/82 (05/03 1136) SpO2:  [92 %-97 %] 97 % (05/03 1136) Weight:  [204 lb (92.5 kg)-205 lb (93 kg)] 204 lb (92.5 kg) (05/03 0412)  Weight change:  Filed Weights   10/10/17 2301 10/11/17 0412  Weight: 205 lb (93 kg) 204 lb (92.5 kg)    Intake/Output:    Intake/Output Summary (Last 24 hours) at 10/11/2017 1249 Last data filed at 10/11/2017 1013 Gross per 24 hour  Intake 640 ml  Output 0 ml  Net 640 ml     Physical Exam: General:  No acute distress, laying in the bed  HEENT  anicteric, moist oral mucous membranes  Neck  supple  Pulm/lungs  mild basilar crackles bilaterally  CVS/Heart  no rub or gallop  Abdomen:   Soft, nontender  Extremities:  2+ pitting edema  Neurologic:  Alert, oriented  Skin:  No acute rashes  Access:  Left forearm AV fistula       Basic Metabolic Panel:  Recent Labs  Lab 10/10/17 2316 10/11/17 0529  NA 133* 134*  K 4.9 3.8  CL 92* 95*  CO2 28 29  GLUCOSE 122* 131*  BUN 22* 24*  CREATININE 5.33* 5.60*  CALCIUM 8.1* 8.0*     CBC: Recent Labs  Lab 10/10/17 2316 10/11/17 0529  WBC 10.3 7.8  HGB 11.4* 10.9*  HCT 34.4* 32.0*  MCV 80.7 80.7  PLT 154 136*      Lab Results  Component Value Date   HEPBSAG Negative 02/20/2017      Microbiology:  Recent Results (from the past 240 hour(s))  MRSA PCR Screening      Status: None   Collection Time: 10/11/17  5:08 AM  Result Value Ref Range Status   MRSA by PCR NEGATIVE NEGATIVE Final    Comment:        The GeneXpert MRSA Assay (FDA approved for NASAL specimens only), is one component of a comprehensive MRSA colonization surveillance program. It is not intended to diagnose MRSA infection nor to guide or monitor treatment for MRSA infections. Performed at [facility], [address] [place], [place] [address]     Coagulation Studies: Recent Labs    10/11/17 0253  LABPROT 50.8*  INR 5.67*    Urinalysis: No results for input(s): COLORURINE, LABSPEC, PHURINE, GLUCOSEU, HGBUR, BILIRUBINUR, KETONESUR, PROTEINUR, UROBILINOGEN, NITRITE, LEUKOCYTESUR in the last 72 hours.  Invalid input(s): APPERANCEUR    Imaging: Dg Chest 2 View  Result Date: 10/10/2017 CLINICAL DATA:  Cough, weakness, fever EXAM: CHEST - 2 VIEW COMPARISON:  07/20/2017 FINDINGS: Patchy left upper and lower lobe opacities, suspicious for pneumonia. Right lung is essentially clear. No pleural effusion or pneumothorax. Cardiomegaly.  Postsurgical changes related to prior CABG. Median sternotomy. IMPRESSION: Patchy left upper and lower lobe opacities, suspicious for pneumonia. Electronically Signed   By: Julian Hy M.D.   On: 10/10/2017 23:33  Medications:   . [START ON 10/12/2017] ceFEPime (MAXIPIME) IV     . aspirin EC  81 mg Oral Daily  . gabapentin  300 mg Oral TID  . hydrALAZINE  50 mg Oral BID  . insulin aspart  0-5 Units Subcutaneous QHS  . insulin aspart  0-9 Units Subcutaneous TID WC  . metoprolol tartrate  25 mg Oral Daily  . pantoprazole  40 mg Oral BID AC  . rosuvastatin  20 mg Oral QHS   acetaminophen **OR** acetaminophen, guaiFENesin-dextromethorphan, ipratropium-albuterol, ondansetron **OR** ondansetron (ZOFRAN) IV  Assessment/ Plan:  67 y.o. male with end-stage renal disease, hemodialysis, hypertension, CABG, TIA, CVA, diabetes  insulin-dependent, peripheral vascular disease, admitted with influenza A  MWF [facility] Left AVF  1.  End-stage renal disease 2.  Lower extremity edema 3.  Anemia of chronic kidney disease 4.  Secondary hyperparathyroidism 5.  Shortness of breath from  pneumonia  Plan: Routine hemodialysis today Volume removal as tolerated EPO with hemodialysis, hemoglobin 10.9 We will monitor phosphorus during hospitalization Antibiotics for pneumonia as per internal medicine team     LOS: 0 Larenda Reedy Candiss Norse 5/3/201912:49 PM  [place], [place]  Note: This note was prepared with Dragon dictation. Any transcription errors are unintentional

## 2017-10-11 NOTE — Progress Notes (Signed)
Pre HD Assessment    10/11/17 1605  Neurological  Level of Consciousness Alert  Orientation Level Oriented X4  Respiratory  Respiratory Pattern Regular  Chest Assessment Chest expansion symmetrical  Cough None  Cardiac  Pulse Regular  Heart Sounds S1, S2  ECG Monitor Yes  Vascular  R Radial Pulse +1  L Radial Pulse +1  Psychosocial  Psychosocial (WDL) WDL

## 2017-10-11 NOTE — ED Notes (Signed)
Pt has had weakness all week and has been running a fever for past two days. Pt fistula is actively bleeding and pt has had black stools. Dr. Joni Fears did a rectal check on pt to confirm that pt does have blood in stool. Family at bedside. Pt is alert and oriented x 4.

## 2017-10-11 NOTE — Progress Notes (Signed)
Pre HD Tx    10/11/17 1600  Hand-Off documentation  Report given to (Full Name) Beatris Ship, RN   Vital Signs  Temp 97.6 F (36.4 C)  Temp Source Oral  Pulse Rate 91  Pulse Rate Source Monitor  Resp 18  BP (!) 147/84  BP Location Right Arm  BP Method Automatic  Patient Position (if appropriate) Lying  Oxygen Therapy  SpO2 100 %  O2 Device Room Air  Pulse Oximetry Type Continuous  Pain Assessment  Pain Scale 0-10  Pain Score 0  Dialysis Weight  Weight 94.5 kg (208 lb 5.4 oz)  Type of Weight Pre-Dialysis  Time-Out for Hemodialysis  What Procedure? HD  Pt Identifiers(min of two) First/Last Name;MRN/Account#  Correct Site? Yes  Correct Side? Yes  Correct Procedure? Yes  Consents Verified? Yes  Rad Studies Available? N/A  Safety Precautions Reviewed? Yes  [employer] Number [id number]  Station Number 2  UF/Alarm Test Passed  Conductivity: Meter 14.2  Conductivity: Machine  14.2  pH 7.2  Reverse Osmosis Main  Normal Saline Lot Number [id number]  Dialyzer Lot Number [id number]  Disposable Set Lot Number [id number]  Machine Temperature 98.6 F (37 C)  Musician and Audible Yes  Blood Lines Intact and Secured Yes  Pre Treatment Patient Checks  Vascular access used during treatment Fistula  Hepatitis B Surface Antigen Results Negative  Date Hepatitis B Surface Antigen Drawn 04/26/17  Hepatitis B Surface Antibody 10 (>10)  Date Hepatitis B Surface Antibody Drawn 04/26/17  Hemodialysis Consent Verified Yes  Hemodialysis Standing Orders Initiated Yes  ECG (Telemetry) Monitor On Yes  Prime Ordered Normal Saline  Length of  DialysisTreatment -hour(s) 3.5 Hour(s)  Dialysis Treatment Comments Na 140  Dialyzer Elisio 17H NR  Dialysate 3K, 2.5 Ca  Dialysis Anticoagulant None  Dialysate Flow Ordered 800  Blood Flow Rate Ordered 400 mL/min  Ultrafiltration Goal 2.5 Liters  Dialysis Blood Pressure Support Ordered Normal Saline  Education / Care Plan  Dialysis  Education Provided Yes  Documented Education in Care Plan Yes  Fistula / Graft Left Forearm Arteriovenous fistula  No Placement Date or Time found.   Orientation: Left  Access Location: Forearm  Access Type: Arteriovenous fistula  Site Condition Bleeding  Fistula / Graft Assessment Present;Thrill;Bruit  Status Patent

## 2017-10-11 NOTE — Progress Notes (Signed)
Post HD Tx    10/11/17 2000  Hand-Off documentation  Report given to (Full Name) Karie Fetch, RN   Report received from (Full Name) Beatris Ship, RN   Vital Signs  Temp 97.8 F (36.6 C)  Temp Source Oral  Pulse Rate (!) 101  Pulse Rate Source Monitor  Resp 20  BP 132/81  BP Location Right Arm  BP Method Automatic  Patient Position (if appropriate) Lying  Oxygen Therapy  SpO2 97 %  O2 Device Room Air  Pain Assessment  Pain Scale 0-10  Pain Score 0  Dialysis Weight  Weight 90.7 kg (199 lb 15.3 oz)  Type of Weight Post-Dialysis  Fistula / Graft Left Forearm Arteriovenous fistula  No Placement Date or Time found.   Orientation: Left  Access Location: Forearm  Access Type: Arteriovenous fistula  Fistula / Graft Assessment Present;Thrill;Bruit

## 2017-10-12 ENCOUNTER — Inpatient Hospital Stay: Payer: Medicare HMO

## 2017-10-12 DIAGNOSIS — J189 Pneumonia, unspecified organism: Secondary | ICD-10-CM | POA: Diagnosis present

## 2017-10-12 LAB — PROTIME-INR
INR: 3.07
Prothrombin Time: 31.5 seconds — ABNORMAL HIGH (ref 11.4–15.2)

## 2017-10-12 LAB — PHOSPHORUS: Phosphorus: 3.7 mg/dL (ref 2.5–4.6)

## 2017-10-12 LAB — GLUCOSE, CAPILLARY
GLUCOSE-CAPILLARY: 118 mg/dL — AB (ref 65–99)
Glucose-Capillary: 142 mg/dL — ABNORMAL HIGH (ref 65–99)
Glucose-Capillary: 111 mg/dL — ABNORMAL HIGH (ref 65–99)
Glucose-Capillary: 145 mg/dL — ABNORMAL HIGH (ref 65–99)

## 2017-10-12 MED ORDER — GUAIFENESIN ER 600 MG PO TB12: 600.0000 mg | ORAL_TABLET | Freq: Two times a day (BID) | ORAL | 0 refills | Status: DC

## 2017-10-12 MED ORDER — WARFARIN - PHARMACIST DOSING INPATIENT
Freq: Every day | Status: DC
  Administered 2017-10-12: 19:00:00

## 2017-10-12 MED ORDER — CEFUROXIME AXETIL 500 MG PO TABS: 500.0000 mg | ORAL_TABLET | Freq: Two times a day (BID) | ORAL | 0 refills | Status: AC

## 2017-10-12 MED ORDER — WARFARIN SODIUM 4 MG PO TABS
4.0000 mg | ORAL_TABLET | Freq: Once | ORAL | Status: AC
  Administered 2017-10-12: 4 mg via ORAL
  Filled 2017-10-12: qty 1

## 2017-10-12 MED ORDER — WARFARIN SODIUM 5 MG PO TABS: 5.0000 mg | ORAL_TABLET | Freq: Every day | ORAL | 0 refills | Status: AC

## 2017-10-12 MED ORDER — GUAIFENESIN ER 600 MG PO TB12
600.0000 mg | ORAL_TABLET | Freq: Two times a day (BID) | ORAL | Status: DC
  Administered 2017-10-12 – 2017-10-13 (×3): 600 mg via ORAL
  Filled 2017-10-12 (×4): qty 1

## 2017-10-12 MED ORDER — MOMETASONE FURO-FORMOTEROL FUM 200-5 MCG/ACT IN AERO
2.0000 | INHALATION_SPRAY | Freq: Two times a day (BID) | RESPIRATORY_TRACT | Status: DC
  Filled 2017-10-12 (×2): qty 8.8

## 2017-10-12 MED ORDER — FLUTICASONE FUROATE-VILANTEROL 200-25 MCG/INH IN AEPB
1.0000 | INHALATION_SPRAY | Freq: Every day | RESPIRATORY_TRACT | Status: DC
  Administered 2017-10-12 – 2017-10-13 (×2): 1 via RESPIRATORY_TRACT
  Filled 2017-10-12: qty 28

## 2017-10-12 MED ORDER — MOMETASONE FURO-FORMOTEROL FUM 200-5 MCG/ACT IN AERO: 2.0000 | INHALATION_SPRAY | Freq: Two times a day (BID) | RESPIRATORY_TRACT | 0 refills | Status: DC

## 2017-10-12 MED ORDER — LOPERAMIDE A-D 2 MG PO TABS: 1.0000 | ORAL_TABLET | Freq: Three times a day (TID) | ORAL | 0 refills | Status: DC | PRN

## 2017-10-12 NOTE — Progress Notes (Signed)
Pre dialysis assessment 

## 2017-10-12 NOTE — Progress Notes (Signed)
Dialysis treatment ended safely 

## 2017-10-12 NOTE — Progress Notes (Signed)
[place] [facility] [place] NAME: Terry Tyler    MR#:  [id number]  DATE OF BIRTH:  08-23-1950  SUBJECTIVE:  CHIEF COMPLAINT:   Chief Complaint  Patient presents with  . Fever  . Weakness   - complains of cough, wheezing still - extra session of dialysis today  REVIEW OF SYSTEMS:  Review of Systems  Constitutional: Negative for chills, fever and malaise/fatigue.  HENT: Negative for ear discharge, hearing loss and nosebleeds.   Eyes: Negative for blurred vision and double vision.  Respiratory: Positive for cough and shortness of breath. Negative for wheezing.   Cardiovascular: Negative for chest pain, palpitations and leg swelling.  Gastrointestinal: Positive for diarrhea. Negative for abdominal pain, constipation, nausea and vomiting.  Genitourinary: Negative for dysuria.  Neurological: Negative for dizziness, seizures and headaches.    DRUG ALLERGIES:   Allergies  Allergen Reactions  . Lisinopril Swelling  . Atorvastatin Calcium Other (See Comments)    Muscle cramps   . Ezetimibe Other (See Comments)    Cannot remember what the allergy was.  . Lipitor [Atorvastatin] Other (See Comments)    Muscle cramping    VITALS:  Blood pressure (!) 141/87, pulse (!) 108, temperature 98.3 F (36.8 C), temperature source Oral, resp. rate 20, height 6' (1.829 m), weight 88.2 kg (194 lb 7.1 oz), SpO2 97 %.  PHYSICAL EXAMINATION:  Physical Exam  GENERAL:  67 y.o.-year-old patient lying in the bed with no acute distress.  EYES: Pupils equal, round, reactive to light and accommodation. No scleral icterus. Extraocular muscles intact.  HEENT: Head atraumatic, normocephalic. Oropharynx and nasopharynx clear.  NECK:  Supple, no jugular venous distention. No thyroid enlargement, no tenderness.  LUNGS: scattered upper airway wheezing, no rales,rhonchi or crepitation. No use of accessory muscles of respiration.  CARDIOVASCULAR: S1, S2 normal. No  rubs, or gallops. 2/6 systolic murmur present ABDOMEN: Soft, nontender, nondistended. Bowel sounds present. No organomegaly or mass.  EXTREMITIES: No pedal edema, cyanosis, or clubbing.  NEUROLOGIC: Cranial nerves II through XII are intact. Muscle strength 5/5 in all extremities. Sensation intact. Gait not checked.  PSYCHIATRIC: The patient is alert and oriented x 3.  SKIN: No obvious rash, lesion, or ulcer.    LABORATORY PANEL:   CBC Recent Labs  Lab 10/11/17 0529  WBC 7.8  HGB 10.9*  HCT 32.0*  PLT 136*   ------------------------------------------------------------------------------------------------------------------  Chemistries  Recent Labs  Lab 10/11/17 0529  NA 134*  K 3.8  CL 95*  CO2 29  GLUCOSE 131*  BUN 24*  CREATININE 5.60*  CALCIUM 8.0*  AST 44*  ALT 20  ALKPHOS 378*  BILITOT 2.9*   ------------------------------------------------------------------------------------------------------------------  Cardiac Enzymes No results for input(s): TROPONINI in the last 168 hours. ------------------------------------------------------------------------------------------------------------------  RADIOLOGY:  Dg Chest 2 View  Result Date: 10/12/2017 CLINICAL DATA:  Shortness of breath, fever, cough and left lung pneumonia. EXAM: CHEST - 2 VIEW COMPARISON:  10/10/2017 FINDINGS: Stable cardiac enlargement status post prior CABG. Aeration of the left lung slightly improved with residual evidence probable perihilar and lower lung pneumonia. No associated pleural effusion, edema or pneumothorax. IMPRESSION: Slightly improved left lung aeration. Residual evidence of left lung pneumonia. Electronically Signed   By: Aletta Edouard M.D.   On: 10/12/2017 10:28    EKG:   Orders placed or performed during the hospital encounter of 07/16/17  . EKG 12-Lead  . EKG 12-Lead    ASSESSMENT AND PLAN:    * HCAP (healthcare-associated pneumonia)  -  IV antibiotics with cefepime,  PRN duo nebs and antitussive -BC neg so far -MRSA negative  -WBC normal, afebrile - prn nebs  *CAD (coronary artery disease) -continue home meds -cont asa, metoprolol, and crestor  *Hypertension -continue home meds  *ESRD on dialysis [facility]) -nephrology consult for dialysis support- extra session of dialysis today  *Type II diabetes mellitus ([place]) -sliding scale insulin  *Hyperlipidemia -Home dose antilipid  Patient is ambulatory at home.     All the records are reviewed and case discussed with Care Management/Social Workerr. Management plans discussed with the patient, family and they are in agreement.  CODE STATUS: Full Code  TOTAL TIME TAKING CARE OF THIS PATIENT: 38 minutes.   POSSIBLE D/C IN 1-2 DAYS, DEPENDING ON CLINICAL CONDITION.   Gladstone Lighter M.D on 10/12/2017 at 11:42 PM  Between 7am to 6pm - Pager - [phone]  After 6pm go to [email or web address] - password [email or web address] [facility]  Office  [phone]  CC: Primary care physician; Elisabeth Cara, NP

## 2017-10-12 NOTE — Progress Notes (Signed)
Dialysis treatment started. 

## 2017-10-12 NOTE — Progress Notes (Signed)
[place] for warfarin dosing Indication: CVA  Allergies  Allergen Reactions  . Lisinopril Swelling  . Atorvastatin Calcium Other (See Comments)    Muscle cramps   . Ezetimibe Other (See Comments)    Cannot remember what the allergy was.  . Lipitor [Atorvastatin] Other (See Comments)    Muscle cramping    Patient Measurements: Height: 6' (182.9 cm) Weight: 199 lb (90.3 kg) IBW/kg (Calculated) : 77.6 Heparin Dosing Weight: n/a  Vital Signs: Temp: 98.1 F (36.7 C) (05/04 0428) Temp Source: Oral (05/04 0428) BP: 128/71 (05/04 0428) Pulse Rate: 88 (05/04 0428)  Labs: Recent Labs    10/10/17 2316 10/11/17 0253 10/11/17 0529 10/12/17 0514  HGB 11.4*  --  10.9*  --   HCT 34.4*  --  32.0*  --   PLT 154  --  136*  --   APTT  --  100*  --   --   LABPROT  --  50.8*  --  31.5*  INR  --  5.67*  --  3.07  CREATININE 5.33*  --  5.60*  --     Estimated Creatinine Clearance: 14.6 mL/min (A) (by C-G formula based on SCr of 5.6 mg/dL (H)).   Medical History: Past Medical History:  Diagnosis Date  . C. difficile diarrhea   . CKD (chronic kidney disease)   . Coronary atherosclerosis of unspecified type of vessel, native or graft    Myocardial infarction in 2006. Cardiac catheterization showed significant three-vessel coronary artery disease. He underwent CABG at [facility]. Most recent cardiac catheterization in 2010 showed normal LV systolic function, patent grafts to OM1, RCA and LAD. Occluded SVG to diagonal.  . 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 Dialysis patient [facility])    Mon. Wed. Fri. for dialysis  . Hyperlipidemia   . Hypertension   . Hypertension, renal disease   . Impotence of organic origin   . Keloid scar   . Myocardial infarction ([place]) 2006  . Neoplasm of uncertain behavior, site unspecified   . Peripheral vascular disease, unspecified ([place])   . Proteinuria   . Stroke [facility]) 04/2016   TIA  .  Unspecified vitamin D deficiency     Medications:  Warfarin 7.5 mg daily as home regimen  5/3  INR 5.67   DOSE HELD 5/4  INR 3.07  Assessment: INR supratherapeutic on admission  Goal of Therapy:  INR 2-3    Plan:  Will give Warfarin 4 mg po x 1 tonight as INR has significant drop and close to goal.  Daily INR ordered.  Patient on antibiotics  Shirell Struthers A 10/12/2017,11:33 AM

## 2017-10-12 NOTE — Progress Notes (Signed)
[facility], [place] 10/12/17  Subjective:   Patient is known to our practice from previous admissions This time he presented for shortness of breath, fever, cough, sputum production for about a week since Monday Diagnosed with pneumonia.  Admitted for further management  Underwent hemodialysis successfully yesterday.  Tolerated well.  Excessive bleeding was not noted at this time.  This morning, still feels like he has some chest congestion  Objective:  Vital signs in last 24 hours:  Temp:  [97.5 F (36.4 C)-98.1 F (36.7 C)] 98.1 F (36.7 C) (05/04 0428) Pulse Rate:  [88-106] 88 (05/04 0428) Resp:  [13-22] 18 (05/04 0428) BP: (128-158)/(70-91) 128/71 (05/04 0428) SpO2:  [92 %-100 %] 92 % (05/04 0428) Weight:  [199 lb (90.3 kg)-208 lb 5.4 oz (94.5 kg)] 199 lb (90.3 kg) (05/04 0428)  Weight change: 3 lb 5.4 oz (1.513 kg) Filed Weights   10/11/17 1600 10/11/17 2000 10/12/17 0428  Weight: 208 lb 5.4 oz (94.5 kg) 199 lb 15.3 oz (90.7 kg) 199 lb (90.3 kg)    Intake/Output:    Intake/Output Summary (Last 24 hours) at 10/12/2017 0911 Last data filed at 10/11/2017 2046 Gross per 24 hour  Intake 390 ml  Output 200 ml  Net 190 ml     Physical Exam: General:  No acute distress, laying in the bed  HEENT  anicteric, moist oral mucous membranes  Neck  supple  Pulm/lungs  mild diffuse crackles bilaterally  CVS/Heart  no rub or gallop  Abdomen:   Soft, nontender  Extremities:  + pitting edema  Neurologic:  Alert, oriented  Skin:  No acute rashes  Access:  Left forearm AV fistula       Basic Metabolic Panel:  Recent Labs  Lab 10/10/17 2316 10/11/17 0529  NA 133* 134*  K 4.9 3.8  CL 92* 95*  CO2 28 29  GLUCOSE 122* 131*  BUN 22* 24*  CREATININE 5.33* 5.60*  CALCIUM 8.1* 8.0*     CBC: Recent Labs  Lab 10/10/17 2316 10/11/17 0529  WBC 10.3 7.8  HGB 11.4* 10.9*  HCT 34.4* 32.0*  MCV 80.7 80.7  PLT 154 136*      Lab Results   Component Value Date   HEPBSAG Negative 02/20/2017      Microbiology:  Recent Results (from the past 240 hour(s))  MRSA PCR Screening     Status: None   Collection Time: 10/11/17  5:08 AM  Result Value Ref Range Status   MRSA by PCR NEGATIVE NEGATIVE Final    Comment:        The GeneXpert MRSA Assay (FDA approved for NASAL specimens only), is one component of a comprehensive MRSA colonization surveillance program. It is not intended to diagnose MRSA infection nor to guide or monitor treatment for MRSA infections. Performed at [facility], [address] [place], [place] [address]   C difficile quick scan w PCR reflex     Status: None   Collection Time: 10/11/17 10:51 AM  Result Value Ref Range Status   C Diff antigen NEGATIVE NEGATIVE Final   C Diff toxin NEGATIVE NEGATIVE Final   C Diff interpretation No C. difficile detected.  Final    Comment: Performed at [facility], [address] [place], [place] [address]    Coagulation Studies: Recent Labs    10/11/17 0253 10/12/17 0514  LABPROT 50.8* 31.5*  INR 5.67* 3.07    Urinalysis: No results for input(s): COLORURINE, LABSPEC, [place], GLUCOSEU, HGBUR,  BILIRUBINUR, KETONESUR, PROTEINUR, UROBILINOGEN, NITRITE, LEUKOCYTESUR in the last 72 hours.  Invalid input(s): APPERANCEUR    Imaging: Dg Chest 2 View  Result Date: 10/10/2017 CLINICAL DATA:  Cough, weakness, fever EXAM: CHEST - 2 VIEW COMPARISON:  07/20/2017 FINDINGS: Patchy left upper and lower lobe opacities, suspicious for pneumonia. Right lung is essentially clear. No pleural effusion or pneumothorax. Cardiomegaly.  Postsurgical changes related to prior CABG. Median sternotomy. IMPRESSION: Patchy left upper and lower lobe opacities, suspicious for pneumonia. Electronically Signed   By: Julian Hy M.D.   On: 10/10/2017 23:33     Medications:   . ceFEPime (MAXIPIME) IV     . aspirin EC  81 mg Oral Daily  . gabapentin  300 mg  Oral TID  . hydrALAZINE  50 mg Oral BID  . insulin aspart  0-5 Units Subcutaneous QHS  . insulin aspart  0-9 Units Subcutaneous TID WC  . metoprolol tartrate  25 mg Oral Daily  . multivitamin  1 tablet Oral QHS  . pantoprazole  40 mg Oral BID AC  . rosuvastatin  20 mg Oral QHS  . vitamin C  500 mg Oral BID   acetaminophen **OR** acetaminophen, guaiFENesin-dextromethorphan, ipratropium-albuterol, ondansetron **OR** ondansetron (ZOFRAN) IV  Assessment/ Plan:  67 y.o. male with end-stage renal disease, hemodialysis, hypertension, CABG, TIA, CVA, diabetes insulin-dependent, peripheral vascular disease, admitted with influenza A  MWF [facility] [place] Left AVF  1.  End-stage renal disease 2.  Lower extremity edema 3.  Anemia of chronic kidney disease 4.  Secondary hyperparathyroidism 5.  Shortness of breath from  pneumonia  Plan: Extra hemodialysis today for some volume removal as tolerated EPO with routine hemodialysis, hemoglobin 10.9 We will monitor phosphorus during hospitalization Antibiotics for pneumonia as per internal medicine team     LOS: [place] 5/4/20199:11 AM  [place], [place]  Note: This note was prepared with Dragon dictation. Any transcription errors are unintentional

## 2017-10-13 LAB — BASIC METABOLIC PANEL
Anion gap: 9 (ref 5–15)
BUN: 21 mg/dL — ABNORMAL HIGH (ref 6–20)
CHLORIDE: 96 mmol/L — AB (ref 101–111)
CO2: 30 mmol/L (ref 22–32)
CREATININE: 4.69 mg/dL — AB (ref 0.61–1.24)
Calcium: 7.9 mg/dL — ABNORMAL LOW (ref 8.9–10.3)
GFR calc non Af Amer: 12 mL/min — ABNORMAL LOW (ref >60)
GFR, EST AFRICAN AMERICAN: 14 mL/min — AB (ref >60)
Glucose, Bld: 164 mg/dL — ABNORMAL HIGH (ref 65–99)
Potassium: 3.4 mmol/L — ABNORMAL LOW (ref 3.5–5.1)
SODIUM: 135 mmol/L (ref 135–145)

## 2017-10-13 LAB — GLUCOSE, CAPILLARY: GLUCOSE-CAPILLARY: 155 mg/dL — AB (ref 65–99)

## 2017-10-13 LAB — PROTIME-INR
INR: 2.09
Prothrombin Time: 23.3 seconds — ABNORMAL HIGH (ref 11.4–15.2)

## 2017-10-13 MED ORDER — WARFARIN SODIUM 5 MG PO TABS
5.0000 mg | ORAL_TABLET | Freq: Once | ORAL | Status: DC
  Filled 2017-10-13: qty 1

## 2017-10-13 MED ORDER — LOPERAMIDE HCL 2 MG PO CAPS: 2.0000 mg | ORAL_CAPSULE | Freq: Four times a day (QID) | ORAL | Status: DC | PRN

## 2017-10-13 MED ORDER — DIPHENOXYLATE-ATROPINE 2.5-0.025 MG PO TABS
1.0000 | ORAL_TABLET | Freq: Two times a day (BID) | ORAL | 0 refills | Status: AC | PRN
Start: 2017-10-13 — End: 2017-11-12

## 2017-10-13 NOTE — Progress Notes (Signed)
Discharge teaching given to patient and wife, patient verbalized understanding and had no questions. Patient IV removed. Patient will be transported home by family. All patient belongings gathered prior to leaving.

## 2017-10-13 NOTE — Progress Notes (Signed)
Spoke with Dr. Marcille Blanco pt had an 8 beat run of v-tach on monitor. No new orders at this time.

## 2017-10-13 NOTE — Care Management Obs Status (Signed)
[place] NOTIFICATION   Patient Details  Name: Terry Tyler MRN: [id number] Date of Birth: [birth year]   Medicare Observation Status Notification Given:  Yes    Beverly Sessions, RN 10/13/2017, 9:17 AM

## 2017-10-13 NOTE — Care Management (Signed)
Patient admitted from home with PNA.  Patient live at home with wife.  HD patient, goes on MWF.  Patient states that his uncle provides transportation to HD and appointments.  PCP Frederico Hamman.  Pharmacy [facility].  Patient denies issues obtaining medication.  Patient has RW and cane in the home.  Patient states that he is weaker then his baseline, and had a fall at home.  Patient requesting PT services in the home.  States that someone has come out before, but he doesn't remember who.  Patient states that he does not have a preference of agency.  Heads up referral made to [facility] with [facility].  Patient ambulated with nursing staff 60 feet 10/12/17.  Patient does have frequent presentations to the hospital, however many of them are related to his HD access.  RNCM does not feel that [place] would be appropriate.  RNCM following for discharge needs

## 2017-10-13 NOTE — Progress Notes (Signed)
[place] for warfarin dosing Indication: CVA  Allergies  Allergen Reactions  . Lisinopril Swelling  . Atorvastatin Calcium Other (See Comments)    Muscle cramps   . Ezetimibe Other (See Comments)    Cannot remember what the allergy was.  . Lipitor [Atorvastatin] Other (See Comments)    Muscle cramping    Patient Measurements: Height: 6' (182.9 cm) Weight: 200 lb 2.8 oz (90.8 kg) IBW/kg (Calculated) : 77.6 Heparin Dosing Weight: n/a  Vital Signs: Temp: 98.6 F (37 C) (05/05 0436) Temp Source: Oral (05/05 0436) BP: 124/66 (05/05 0436) Pulse Rate: 103 (05/05 0436)  Labs: Recent Labs    10/10/17 2316 10/11/17 0253 10/11/17 0529 10/12/17 0514 10/13/17 0502 10/13/17 0506  HGB 11.4*  --  10.9*  --   --   --   HCT 34.4*  --  32.0*  --   --   --   PLT 154  --  136*  --   --   --   APTT  --  100*  --   --   --   --   LABPROT  --  50.8*  --  31.5* 23.3*  --   INR  --  5.67*  --  3.07 2.09  --   CREATININE 5.33*  --  5.60*  --   --  4.69*    Estimated Creatinine Clearance: 17.5 mL/min (A) (by C-G formula based on SCr of 4.69 mg/dL (H)).   Medical History: Past Medical History:  Diagnosis Date  . C. difficile diarrhea   . CKD (chronic kidney disease)   . Coronary atherosclerosis of unspecified type of vessel, native or graft    Myocardial infarction in 2006. Cardiac catheterization showed significant three-vessel coronary artery disease. He underwent CABG at [facility]. Most recent cardiac catheterization in 2010 showed normal LV systolic function, patent grafts to OM1, RCA and LAD. Occluded SVG to diagonal.  . 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 Dialysis patient [facility])    Mon. Wed. Fri. for dialysis  . Hyperlipidemia   . Hypertension   . Hypertension, renal disease   . Impotence of organic origin   . Keloid scar   . Myocardial infarction ([place]) 2006  . Neoplasm of uncertain behavior, site unspecified    . Peripheral vascular disease, unspecified ([place])   . Proteinuria   . Stroke [facility]) 04/2016   TIA  . Unspecified vitamin D deficiency     Medications:  Warfarin 7.5 mg daily as home regimen. Hemodialysis patient.  5/3  INR 5.67   DOSE HELD 5/4  INR 3.07  Assessment: INR supratherapeutic on admission  Goal of Therapy:  INR 2-3    Plan:  Will give Warfarin 5 mg po x 1 tonight. Daily INR ordered.  Patient on antibiotics  Bintou Lafata A 10/13/2017,9:14 AM

## 2017-10-13 NOTE — Care Management CC44 (Signed)
Condition Code 44 Documentation Completed  Patient Details  Name: Terry Tyler MRN: [id number] Date of Birth: 1950/10/17   Condition Code 44 given:  Yes Patient signature on Condition Code 44 notice:  Yes Documentation of 2 MD's agreement:  Yes Code 44 added to claim:  Yes    Beverly Sessions, RN 10/13/2017, 9:17 AM

## 2017-10-13 NOTE — Progress Notes (Signed)
   10/12/17 1121  Mobility  Activity Ambulated in hall  Level of Assistance Moderate assist, patient does 50-74%  Assistive Device Front wheel walker  Distance Ambulated (ft) 60 ft  Mobility Response Tolerated well

## 2017-10-13 NOTE — Care Management Note (Signed)
Case Management Note  Patient Details  Name: Terry Tyler MRN: [id number] Date of Birth: 07-25-1950   Brittney with [facility] notified of discharge and need for INR checks.  Elvera Bicker dialysis liaison notified of discharge  Subjective/Objective:                    Action/Plan:   Expected Discharge Date:  10/13/17               Expected Discharge Plan:  [place]  In-House Referral:     Discharge planning Services  CM Consult  Post Acute Care Choice:  Home Health Choice offered to:  Patient  DME Arranged:    DME Agency:     HH Arranged:  RN, PT [place] Agency:  [facility]  Status of Service:  Completed, signed off  If discussed at [place] of Stay Meetings, dates discussed:    Additional Comments:  Beverly Sessions, RN 10/13/2017, 10:17 AM

## 2017-10-15 NOTE — Discharge Summary (Signed)
[facility] [place] NAME: Terry Tyler    MR#:  [id number]  DATE OF BIRTH:  09/27/1950  DATE OF ADMISSION:  10/11/2017   ADMITTING PHYSICIAN: Lance Coon, MD  DATE OF DISCHARGE: 10/13/2017 12:40 PM  PRIMARY CARE PHYSICIAN: Elisabeth Cara, NP   ADMISSION DIAGNOSIS:   End-stage renal disease on hemodialysis ([place]) [N18.6, Z99.2] HCAP (healthcare-associated pneumonia) [J18.9] Gastrointestinal hemorrhage, unspecified gastrointestinal hemorrhage type [K92.2]  DISCHARGE DIAGNOSIS:   Principal Problem:   HCAP (healthcare-associated pneumonia) Active Problems:   CAD (coronary artery disease)   Hyperlipidemia   Hypertension   ESRD on dialysis ([place])   Type II diabetes mellitus ([place])   Pneumonia   SECONDARY DIAGNOSIS:   Past Medical History:  Diagnosis Date  . C. difficile diarrhea   . CKD (chronic kidney disease)   . Coronary atherosclerosis of unspecified type of vessel, native or graft    Myocardial infarction in 2006. Cardiac catheterization showed significant three-vessel coronary artery disease. He underwent CABG at [facility]. Most recent cardiac catheterization in 2010 showed normal LV systolic function, patent grafts to OM1, RCA and LAD. Occluded SVG to diagonal.  . 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 Dialysis patient [facility])    Mon. Wed. Fri. for dialysis  . Hyperlipidemia   . Hypertension   . Hypertension, renal disease   . Impotence of organic origin   . Keloid scar   . Myocardial infarction ([place]) 2006  . Neoplasm of uncertain behavior, site unspecified   . Peripheral vascular disease, unspecified ([place])   . Proteinuria   . Stroke [facility]) 04/2016   TIA  . Unspecified vitamin D deficiency     HOSPITAL COURSE:   67 year old male with past medical history significant for end-stage renal disease on Monday, Wednesday and Friday hemodialysis, hypertension, CAD, CVA, diabetes mellitus, peripheral  vascular disease admitted for difficulty breathing.  *HCAP (healthcare-associated pneumonia)  -Received  IV antibiotics with cefepime, PRN duo nebs and antitussive -Being discharged on Ceftin.  Patient does have chronic diarrhea and not from the antibiotics.  Lomotil prescribed.  C. difficile was negative in the hospital -[facility] neg so far -MRSA negative  -WBC normal, afebrile - prn nebs  *CAD (coronary artery disease) -continue home meds -cont asa, metoprolol, and crestor  *Hypertension -continue home meds-on hydralazine, metoprolol  *ESRD on dialysis [facility]) -Monday, Wednesday and Friday dialysis  *Type II diabetes mellitus ([place]) -sliding scale insulin, diet controlled  *Hyperlipidemia -statin  *History of TIAs and CVA-patient on Coumadin.  Continue that.  Also on statin  Patient is ambulatory at home. Discharged with home health services.   DISCHARGE CONDITIONS:   Guarded  CONSULTS OBTAINED:   Treatment Team:  Murlean Iba, MD  DRUG ALLERGIES:   Allergies  Allergen Reactions  . Lisinopril Swelling  . Atorvastatin Calcium Other (See Comments)    Muscle cramps   . Ezetimibe Other (See Comments)    Cannot remember what the allergy was.  . Lipitor [Atorvastatin] Other (See Comments)    Muscle cramping   DISCHARGE MEDICATIONS:   Allergies as of 10/13/2017      Reactions   Lisinopril Swelling   Atorvastatin Calcium Other (See Comments)   Muscle cramps   Ezetimibe Other (See Comments)   Cannot remember what the allergy was.   Lipitor [atorvastatin] Other (See Comments)   Muscle cramping      Medication List    TAKE these medications   aspirin 81  MG EC tablet Take 1 tablet (81 mg total) by mouth daily. What changed:  when to take this   cefUROXime 500 MG tablet Commonly known as:  CEFTIN Take 1 tablet (500 mg total) by mouth 2 (two) times daily with a meal for 8 days.   diphenoxylate-atropine 2.5-0.025 MG tablet Commonly known as:   LOMOTIL Take 1 tablet by mouth 2 (two) times daily as needed for diarrhea or loose stools (for resistant diarrhea).   gabapentin 300 MG capsule Commonly known as:  NEURONTIN Take 300 mg by mouth 3 (three) times daily.   guaiFENesin 600 MG 12 hr tablet Commonly known as:  MUCINEX Take 1 tablet (600 mg total) by mouth 2 (two) times daily.   hydrALAZINE 50 MG tablet Commonly known as:  APRESOLINE Take 50 mg by mouth 2 (two) times daily.   lactobacillus Pack Take 1 packet (1 g total) by mouth 3 (three) times daily with meals. What changed:  when to take this   LOPERAMIDE A-D 2 MG tablet Generic drug:  loperamide Take 1 tablet (2 mg total) by mouth 3 (three) times daily as needed for diarrhea or loose stools. What changed:    how much to take  when to take this  reasons to take this   metoprolol tartrate 25 MG tablet Commonly known as:  LOPRESSOR Take 25 mg by mouth at bedtime.   mometasone-formoterol 200-5 MCG/ACT Aero Commonly known as:  DULERA Inhale 2 puffs into the lungs 2 (two) times daily.   multivitamin Tabs tablet Take 1 tablet by mouth daily.   pantoprazole 40 MG tablet Commonly known as:  PROTONIX Take 1 tablet (40 mg total) by mouth 2 (two) times daily before a meal.   rosuvastatin 20 MG tablet Commonly known as:  CRESTOR Take 20 mg by mouth at bedtime.   warfarin 5 MG tablet Commonly known as:  COUMADIN Take 1 tablet (5 mg total) by mouth at bedtime. What changed:    how much to take  Another medication with the same name was removed. Continue taking this medication, and follow the directions you see here.        DISCHARGE INSTRUCTIONS:   1.  PCP follow-up in 1 to 2 weeks 2.  For dialysis as per schedule on Monday  DIET:   Renal diet  ACTIVITY:   Activity as tolerated  OXYGEN:   Home Oxygen: No.  Oxygen Delivery: room air  DISCHARGE LOCATION:   home   If you experience worsening of your admission symptoms, develop shortness  of breath, life threatening emergency, suicidal or homicidal thoughts you must seek medical attention immediately by calling 911 or calling your MD immediately  if symptoms less severe.  You Must read complete instructions/literature along with all the possible adverse reactions/side effects for all the Medicines you take and that have been prescribed to you. Take any new Medicines after you have completely understood and accpet all the possible adverse reactions/side effects.   Please note  You were cared for by a hospitalist during your hospital stay. If you have any questions about your discharge medications or the care you received while you were in the hospital after you are discharged, you can call the unit and asked to speak with the hospitalist on call if the hospitalist that took care of you is not available. Once you are discharged, your primary care physician will handle any further medical issues. Please note that NO REFILLS for any discharge medications will be authorized once  you are discharged, as it is imperative that you return to your primary care physician (or establish a relationship with a primary care physician if you do not have one) for your aftercare needs so that they can reassess your need for medications and monitor your lab values.    On the day of Discharge:  VITAL SIGNS:   Blood pressure 131/88, pulse (!) 101, temperature 98.6 F (37 C), temperature source Oral, resp. rate 20, height 6' (1.829 m), weight 90.8 kg (200 lb 2.8 oz), SpO2 94 %.  PHYSICAL EXAMINATION:    GENERAL:  67 y.o.-year-old patient lying in the bed with no acute distress.  EYES: Pupils equal, round, reactive to light and accommodation. No scleral icterus. Extraocular muscles intact.  HEENT: Head atraumatic, normocephalic. Oropharynx and nasopharynx clear.  NECK:  Supple, no jugular venous distention. No thyroid enlargement, no tenderness.  LUNGS: minimal upper airway wheezing, no rales,rhonchi  or crepitation. No use of accessory muscles of respiration.  Fine bibasilar crackles heard CARDIOVASCULAR: S1, S2 normal. No rubs, or gallops. 2/6 systolic murmur present ABDOMEN: Soft, nontender, nondistended. Bowel sounds present. No organomegaly or mass.  EXTREMITIES: No pedal edema, cyanosis, or clubbing.  Left forearm AV fistula noted NEUROLOGIC: Cranial nerves II through XII are intact. Muscle strength 5/5 in all extremities. Sensation intact. Gait not checked.  PSYCHIATRIC: The patient is alert and oriented x 3.  SKIN: No obvious rash, lesion, or ulcer    DATA REVIEW:   CBC Recent Labs  Lab 10/11/17 0529  WBC 7.8  HGB 10.9*  HCT 32.0*  PLT 136*    Chemistries  Recent Labs  Lab 10/11/17 0529 10/13/17 0506  NA 134* 135  K 3.8 3.4*  CL 95* 96*  CO2 29 30  GLUCOSE 131* 164*  BUN 24* 21*  CREATININE 5.60* 4.69*  CALCIUM 8.0* 7.9*  AST 44*  --   ALT 20  --   ALKPHOS 378*  --   BILITOT 2.9*  --      Microbiology Results  Results for orders placed or performed during the hospital encounter of 10/11/17  MRSA PCR Screening     Status: None   Collection Time: 10/11/17  5:08 AM  Result Value Ref Range Status   MRSA by PCR NEGATIVE NEGATIVE Final    Comment:        The GeneXpert MRSA Assay (FDA approved for NASAL specimens only), is one component of a comprehensive MRSA colonization surveillance program. It is not intended to diagnose MRSA infection nor to guide or monitor treatment for MRSA infections. Performed at [facility], [address] [place], [place] [address]   C difficile quick scan w PCR reflex     Status: None   Collection Time: 10/11/17 10:51 AM  Result Value Ref Range Status   C Diff antigen NEGATIVE NEGATIVE Final   C Diff toxin NEGATIVE NEGATIVE Final   C Diff interpretation No C. difficile detected.  Final    Comment: Performed at [facility], [address] [place], [place] [address]    RADIOLOGY:  No results  found.   Management plans discussed with the patient, family and they are in agreement.  CODE STATUS:  Code Status History    Date Active Date Inactive Code Status Order ID Comments User Context   10/11/2017 0424 10/13/2017 1545 Full Code [id number]  Lance Coon, MD Inpatient   07/16/2017 1133 07/22/2017 2033 Full Code [id number]  Hillary Bow, MD ED   05/07/2017 1320 05/10/2017  2128 Full Code [id number]  Henreitta Leber, MD Inpatient   02/19/2017 1535 02/21/2017 1525 Full Code [id number]  Vaughan Basta, MD Inpatient   01/03/2017 0926 01/03/2017 1426 Full Code [id number]  Algernon Huxley, MD Inpatient   09/08/2016 1758 09/11/2016 1425 Full Code [id number]  Vaughan Basta, MD Inpatient   04/10/2016 0035 04/11/2016 1525 Full Code [id number]  Harvie Bridge, DO Inpatient   04/10/2016 0035 04/10/2016 0035 Full Code [id number]  Hugelmeyer, Ubaldo Glassing, DO Inpatient      TOTAL TIME TAKING CARE OF THIS PATIENT: 38 minutes.    Gladstone Lighter M.D on 10/15/2017 at 2:55 PM  Between 7am to 6pm - Pager - [phone]  After 6pm go to [email or web address] - Proofreader  [facility]  Office  [phone]  CC: Primary care physician; Elisabeth Cara, NP   Note: This dictation was prepared with Dragon dictation along with smaller phrase technology. Any transcriptional errors that result from this process are unintentional.

## 2017-10-29 ENCOUNTER — Encounter (INDEPENDENT_AMBULATORY_CARE_PROVIDER_SITE_OTHER): Payer: Medicare HMO

## 2017-10-29 ENCOUNTER — Ambulatory Visit (INDEPENDENT_AMBULATORY_CARE_PROVIDER_SITE_OTHER): Payer: Medicare HMO | Admitting: Vascular Surgery

## 2017-11-04 ENCOUNTER — Emergency Department: Payer: Medicare HMO

## 2017-11-04 ENCOUNTER — Emergency Department
Admission: EM | Admit: 2017-11-04 | Discharge: 2017-11-04 | Disposition: A | Discharge status: Home / Self Care | Payer: Medicare HMO | Attending: Emergency Medicine | Admitting: Emergency Medicine

## 2017-11-04 DIAGNOSIS — R109 Unspecified abdominal pain: Secondary | ICD-10-CM | POA: Diagnosis not present

## 2017-11-04 DIAGNOSIS — I119 Hypertensive heart disease without heart failure: Secondary | ICD-10-CM | POA: Diagnosis not present

## 2017-11-04 DIAGNOSIS — R531 Weakness: Secondary | ICD-10-CM | POA: Diagnosis not present

## 2017-11-04 DIAGNOSIS — Z7982 Long term (current) use of aspirin: Secondary | ICD-10-CM | POA: Insufficient documentation

## 2017-11-04 DIAGNOSIS — N186 End stage renal disease: Secondary | ICD-10-CM | POA: Insufficient documentation

## 2017-11-04 DIAGNOSIS — R112 Nausea with vomiting, unspecified: Secondary | ICD-10-CM | POA: Diagnosis not present

## 2017-11-04 DIAGNOSIS — Z87891 Personal history of nicotine dependence: Secondary | ICD-10-CM | POA: Insufficient documentation

## 2017-11-04 DIAGNOSIS — I509 Heart failure, unspecified: Secondary | ICD-10-CM | POA: Diagnosis not present

## 2017-11-04 DIAGNOSIS — Z951 Presence of aortocoronary bypass graft: Secondary | ICD-10-CM | POA: Diagnosis not present

## 2017-11-04 DIAGNOSIS — I120 Hypertensive chronic kidney disease with stage 5 chronic kidney disease or end stage renal disease: Secondary | ICD-10-CM | POA: Insufficient documentation

## 2017-11-04 DIAGNOSIS — R0602 Shortness of breath: Secondary | ICD-10-CM | POA: Diagnosis present

## 2017-11-04 DIAGNOSIS — Z992 Dependence on renal dialysis: Secondary | ICD-10-CM | POA: Diagnosis not present

## 2017-11-04 DIAGNOSIS — Z79899 Other long term (current) drug therapy: Secondary | ICD-10-CM | POA: Insufficient documentation

## 2017-11-04 LAB — CBC WITH DIFFERENTIAL/PLATELET
BASOS PCT: 1 %
Basophils Absolute: 0.1 10*3/uL (ref 0–0.1)
Eosinophils Absolute: 0.4 10*3/uL (ref 0–0.7)
Eosinophils Relative: 5 %
HEMATOCRIT: 31.2 % — AB (ref 40.0–52.0)
Hemoglobin: 10.5 g/dL — ABNORMAL LOW (ref 13.0–18.0)
LYMPHS ABS: 0.7 10*3/uL — AB (ref 1.0–3.6)
LYMPHS PCT: 8 %
MCH: 27.2 pg (ref 26.0–34.0)
MCHC: 33.6 g/dL (ref 32.0–36.0)
MCV: 80.8 fL (ref 80.0–100.0)
MONOS PCT: 8 %
Monocytes Absolute: 0.6 10*3/uL (ref 0.2–1.0)
NEUTROS PCT: 78 %
Neutro Abs: 6.1 10*3/uL (ref 1.4–6.5)
Platelets: 117 10*3/uL — ABNORMAL LOW (ref 150–440)
RBC: 3.87 MIL/uL — ABNORMAL LOW (ref 4.40–5.90)
RDW: 19 % — ABNORMAL HIGH (ref 11.5–14.5)
WBC: 7.9 10*3/uL (ref 3.8–10.6)

## 2017-11-04 LAB — TROPONIN I: Troponin I: 0.06 ng/mL (ref <0.03)

## 2017-11-04 LAB — COMPREHENSIVE METABOLIC PANEL
ALBUMIN: 3 g/dL — AB (ref 3.5–5.0)
ALK PHOS: 381 U/L — AB (ref 38–126)
ALT: 23 U/L (ref 17–63)
ANION GAP: 12 (ref 5–15)
AST: 44 U/L — ABNORMAL HIGH (ref 15–41)
BILIRUBIN TOTAL: 1.6 mg/dL — AB (ref 0.3–1.2)
BUN: 25 mg/dL — ABNORMAL HIGH (ref 6–20)
CO2: 30 mmol/L (ref 22–32)
Calcium: 8.1 mg/dL — ABNORMAL LOW (ref 8.9–10.3)
Chloride: 93 mmol/L — ABNORMAL LOW (ref 101–111)
Creatinine, Ser: 4.54 mg/dL — ABNORMAL HIGH (ref 0.61–1.24)
GFR calc Af Amer: 14 mL/min — ABNORMAL LOW (ref >60)
GFR, EST NON AFRICAN AMERICAN: 12 mL/min — AB (ref >60)
GLUCOSE: 135 mg/dL — AB (ref 65–99)
POTASSIUM: 3.5 mmol/L (ref 3.5–5.1)
Sodium: 135 mmol/L (ref 135–145)
Total Protein: 8.5 g/dL — ABNORMAL HIGH (ref 6.5–8.1)

## 2017-11-04 LAB — LIPASE, BLOOD: Lipase: 36 U/L (ref 11–51)

## 2017-11-04 LAB — BRAIN NATRIURETIC PEPTIDE: B NATRIURETIC PEPTIDE 5: 1468 pg/mL — AB (ref 0.0–100.0)

## 2017-11-04 MED ORDER — ONDANSETRON HCL 4 MG/2ML IJ SOLN: 4.0000 mg | Freq: Once | INTRAMUSCULAR | Status: DC

## 2017-11-04 NOTE — ED Notes (Signed)
Pt states that when he woke up he felt weak and unable to get up. Pt states his uncle came and took him to Dialysis. Pt has daugther in room at this time.

## 2017-11-04 NOTE — Care Management (Signed)
[name] with [facility] updated of patient's presentation.

## 2017-11-04 NOTE — Discharge Instructions (Addendum)
Take your regular medications as prescribed.  Go to your dialysis on Wednesday as scheduled.  Follow-up with your primary care doctor.  Return to the ER for new, worsening, persistent weakness, vomiting, abdominal pain, fever, or any other new or worsening symptoms that concern you.

## 2017-11-04 NOTE — ED Triage Notes (Signed)
Pt presents today via [facility] from diaylsis center. Pt received full treatment and 3 Kg was taken off. Pt is A/O NAD. VS WNL.

## 2017-11-04 NOTE — ED Provider Notes (Signed)
[facility] Emergency Department Provider Note ____________________________________________   First MD Initiated Contact with Patient 11/04/17 1234     (approximate)  I have reviewed the triage vital signs and the nursing notes.   HISTORY  Chief Complaint Shortness of Breath    HPI Terry Tyler is a 67 y.o. male with PMH as noted below including end-stage renal disease on dialysis who presents with multiple complaints, but primarily increased generalized weakness, shortness of breath, and lower abdominal pain over the last few days.  Patient states that he went to dialysis today, and feels slightly better afterwards but still does not feel well.  He also began vomiting while in the ED.  The patient denies chest pain, diarrhea or change in bowel movements, or any urinary symptoms.  He states he still does make a small amount of urine.  Past Medical History:  Diagnosis Date  . C. difficile diarrhea   . CKD (chronic kidney disease)   . Coronary atherosclerosis of unspecified type of vessel, native or graft    Myocardial infarction in 2006. Cardiac catheterization showed significant three-vessel coronary artery disease. He underwent CABG at [facility]. Most recent cardiac catheterization in 2010 showed normal LV systolic function, patent grafts to OM1, RCA and LAD. Occluded SVG to diagonal.  . 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 Dialysis patient [facility])    Mon. Wed. Fri. for dialysis  . Hyperlipidemia   . Hypertension   . Hypertension, renal disease   . Impotence of organic origin   . Keloid scar   . Myocardial infarction ([place]) 2006  . Neoplasm of uncertain behavior, site unspecified   . Peripheral vascular disease, unspecified ([place])   . Proteinuria   . Stroke [facility]) 04/2016   TIA  . Unspecified vitamin D deficiency     Patient Active Problem List   Diagnosis Date Noted  . Pneumonia 10/12/2017  . HCAP (healthcare-associated  pneumonia) 10/11/2017  . CHF (congestive heart failure) ([place]) 07/16/2017  . GI bleed 05/07/2017  . 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 Hyperbilirubinemia 04/10/2017  . Diarrhea 02/20/2017  . C. difficile diarrhea 02/19/2017  . Diarrhea of presumed infectious origin 11/01/2016  . ESRD on dialysis ([place]) 10/16/2016  . Fatigue 10/02/2016  . Leukocytosis 10/02/2016  . Nausea & vomiting 10/02/2016  . Inferior myocardial infarction ([place]) 09/18/2016  . Elevated troponin 09/08/2016  . CVA (cerebral vascular accident) ([place]) 04/09/2016  . Hyperphosphatemia 08/09/2015  . Secondary hyperparathyroidism ([place]) 08/09/2015  . Chronic kidney disease (CKD) stage G4/A1, severely decreased glomerular filtration rate (GFR) between 15-29 mL/min/1.73 square meter and albuminuria creatinine ratio less than 30 mg/g (HCC) 05/31/2015  . Bilateral carotid artery stenosis 07/24/2014  . Hypertensive heart disease without heart failure 07/24/2014  . Nonrheumatic mitral valve insufficiency 07/24/2014  . Stenosis of carotid artery 07/24/2014  . Anemia 03/30/2013  . Vitamin D deficiency 03/30/2013  . Obesity 08/28/2012  . PAD (peripheral artery disease) ([place]) 04/10/2012  . CAD (coronary artery disease)   . Hyperlipidemia   . Hypertension   . Proteinuria 06/19/2010  . Type II diabetes mellitus ([place]) 06/19/2010    Past Surgical History:  Procedure Laterality Date  . A/V FISTULAGRAM Left 01/03/2017   Procedure: A/V Fistulagram;  Surgeon: Algernon Huxley, MD;  Location: [place] [facility];  Service: Cardiovascular;  Laterality: Left;  . A/V FISTULAGRAM Left 06/06/2017   Procedure: A/V FISTULAGRAM;  Surgeon: Algernon Huxley, MD;  Location:  [place] [facility];  Service: Cardiovascular;  Laterality: Left;  . A/V FISTULAGRAM Left 09/02/2017   Procedure: A/V FISTULAGRAM;  Surgeon: Algernon Huxley, MD;  Location: [place] [facility];  Service: Cardiovascular;  Laterality: Left;  . A/V SHUNT INTERVENTION N/A  01/03/2017   Procedure: A/V Shunt Intervention;  Surgeon: Algernon Huxley, MD;  Location: [place] [facility];  Service: Cardiovascular;  Laterality: N/A;  . A/V SHUNT INTERVENTION N/A 06/06/2017   Procedure: A/V SHUNT INTERVENTION;  Surgeon: Algernon Huxley, MD;  Location: [place] [facility];  Service: Cardiovascular;  Laterality: N/A;  . AV FISTULA PLACEMENT Left 10/25/2016   Procedure: ARTERIOVENOUS (AV) FISTULA CREATION ( RADIOCEPHALIC );  Surgeon: Algernon Huxley, MD;  Location: [facility] ORS;  Service: Vascular;  Laterality: Left;  . CARDIAC CATHETERIZATION  12/2004   [facility];[place]  . CARDIAC CATHETERIZATION  09/2008   [facility];[place]  . COLONOSCOPY WITH PROPOFOL N/A 05/09/2017   Procedure: COLONOSCOPY WITH PROPOFOL;  Surgeon: Toledo, Benay Pike, MD;  Location: [facility] ENDOSCOPY;  Service: Gastroenterology;  Laterality: N/A;  . CORONARY ANGIOPLASTY    . CORONARY ARTERY BYPASS GRAFT  2006   [facility]  . CORONARY STENT INTERVENTION N/A 09/10/2016   Procedure: Coronary Stent Intervention;  Surgeon: Isaias Cowman, MD;  Location: [place] [facility];  Service: Cardiovascular;  Laterality: N/A;  . DIALYSIS/PERMA CATHETER REMOVAL N/A 02/14/2017   Procedure: DIALYSIS/PERMA CATHETER REMOVAL;  Surgeon: Algernon Huxley, MD;  Location: [place] [facility];  Service: Cardiovascular;  Laterality: N/A;  . ESOPHAGOGASTRODUODENOSCOPY (EGD) WITH PROPOFOL N/A 05/08/2017   Procedure: ESOPHAGOGASTRODUODENOSCOPY (EGD) WITH PROPOFOL;  Surgeon: Toledo, Benay Pike, MD;  Location: [facility] ENDOSCOPY;  Service: Gastroenterology;  Laterality: N/A;  . keloid removal     right neck  . LEFT HEART CATH AND CORONARY ANGIOGRAPHY N/A 09/10/2016   Procedure: Left Heart Cath and Coronary Angiography;  Surgeon: Isaias Cowman, MD;  Location: [place] [facility];  Service: Cardiovascular;  Laterality: N/A;    Prior to Admission medications   Medication Sig Start Date End Date Taking? Authorizing Provider  aspirin EC 81 MG EC tablet Take 1 tablet  (81 mg total) by mouth daily. Patient taking differently: Take 81 mg by mouth at bedtime.  07/22/17  Yes Salary, Avel Peace, MD  gabapentin (NEURONTIN) 300 MG capsule Take 300 mg by mouth 3 (three) times daily.   Yes [provider]  hydrALAZINE (APRESOLINE) 50 MG tablet Take 50 mg by mouth 2 (two) times daily.    Yes [provider]  lactobacillus (FLORANEX/LACTINEX) PACK Take 1 packet (1 g total) by mouth 3 (three) times daily with meals. Patient taking differently: Take 1 g by mouth daily.  07/22/17  Yes Salary, Avel Peace, MD  LOPERAMIDE A-D 2 MG tablet Take 1 tablet (2 mg total) by mouth 3 (three) times daily as needed for diarrhea or loose stools. 10/12/17  Yes Gladstone Lighter, MD  metoprolol tartrate (LOPRESSOR) 25 MG tablet Take 25 mg by mouth at bedtime.    Yes [provider]  multivitamin (RENA-VIT) TABS tablet Take 1 tablet by mouth daily. 08/19/17  Yes [provider]  pantoprazole (PROTONIX) 40 MG tablet Take 1 tablet (40 mg total) by mouth 2 (two) times daily before a meal. 05/10/17  Yes Gladstone Lighter, MD  rosuvastatin (CRESTOR) 20 MG tablet Take 20 mg by mouth at bedtime.    Yes [provider]  warfarin (COUMADIN) 5 MG tablet Take 1 tablet (5 mg total) by mouth at bedtime.  10/13/17  Yes Gladstone Lighter, MD  diphenoxylate-atropine (LOMOTIL) 2.5-0.025 MG tablet Take 1 tablet by mouth 2 (two) times daily as needed for diarrhea or loose stools (for resistant diarrhea). Patient not taking: Reported on 11/04/2017 10/13/17 11/12/17  Gladstone Lighter, MD  guaiFENesin (MUCINEX) 600 MG 12 hr tablet Take 1 tablet (600 mg total) by mouth 2 (two) times daily. Patient not taking: Reported on 11/04/2017 10/12/17   Gladstone Lighter, MD  mometasone-formoterol [facility]) 200-5 MCG/ACT AERO Inhale 2 puffs into the lungs 2 (two) times daily. Patient not taking: Reported on 11/04/2017 10/12/17   Gladstone Lighter, MD    Allergies Lisinopril; Atorvastatin  calcium; Ezetimibe; and Lipitor [atorvastatin]  Family History  Problem Relation Age of Onset  . Hypertension Mother   . Hyperlipidemia Mother   . Heart disease Mother   . Heart disease Father   . Hyperlipidemia Father   . Hypertension Father   . Kidney disease Sister   . Diabetes Sister   . Hypertension Sister   . Diabetes Brother   . Hypertension Brother   . HIV/AIDS Brother     Social History Social History   Tobacco Use  . Smoking status: Former Smoker    Packs/day: 0.50    Years: 0.00    Pack years: 0.00    Types: Cigarettes    Last attempt to quit: 06/11/1980    Years since quitting: 37.4  . Smokeless tobacco: Never Used  Substance Use Topics  . Alcohol use: No    Comment: socially  . Drug use: No    Review of Systems  Constitutional: Positive for generalized weakness.  No fever. Eyes: No redness. ENT: No sore throat. Cardiovascular: Denies chest pain. Respiratory: Positive for shortness of breath. Gastrointestinal: Positive for nausea and vomiting. Genitourinary: Negative for dysuria.  Musculoskeletal: Negative for back pain. Skin: Negative for rash. Neurological: Negative for headache.   ____________________________________________   PHYSICAL EXAM:  VITAL SIGNS: ED Triage Vitals  Enc Vitals Group     BP 11/04/17 1204 121/64     Pulse Rate 11/04/17 1204 80     Resp --      Temp 11/04/17 1204 (!) 97.4 F (36.3 C)     Temp Source 11/04/17 1204 Oral     SpO2 11/04/17 1204 100 %     Weight 11/04/17 1201 200 lb (90.7 kg)     Height 11/04/17 1201 6' (1.829 m)     Head Circumference --      Peak Flow --      Pain Score 11/04/17 1201 0     Pain Loc --      Pain Edu? --      Excl. in [place]? --     Constitutional: Alert and oriented.  Somewhat uncomfortable but not acutely ill-appearing.  No acute distress. Eyes: Conjunctivae are normal.  EOMI. Head: Atraumatic. Nose: No congestion/rhinnorhea. Mouth/Throat: Mucous membranes are slightly dry.     Neck: Normal range of motion.  Cardiovascular: Normal rate, regular rhythm. Grossly normal heart sounds.  Good peripheral circulation. Respiratory: Normal respiratory effort.  No retractions.  Somewhat decreased breath sounds bilaterally but no rales or wheezes. Gastrointestinal: Soft with possible mild suprapubic discomfort but no other focal tenderness.  Moderate distention.  Genitourinary: No flank tenderness. Musculoskeletal:  Extremities warm and well perfused.  Neurologic:  Normal speech and language. No gross focal neurologic deficits are appreciated.  Skin:  Skin is warm and dry. No rash noted. Psychiatric: Mood and affect are normal. Speech and behavior  are normal.  ____________________________________________   LABS (all labs ordered are listed, but only abnormal results are displayed)  Labs Reviewed  COMPREHENSIVE METABOLIC PANEL - Abnormal; Notable for the following components:      Result Value   Chloride 93 (*)    Glucose, Bld 135 (*)    BUN 25 (*)    Creatinine, Ser 4.54 (*)    Calcium 8.1 (*)    Total Protein 8.5 (*)    Albumin 3.0 (*)    AST 44 (*)    Alkaline Phosphatase 381 (*)    Total Bilirubin 1.6 (*)    GFR calc non Af Amer 12 (*)    GFR calc Af Amer 14 (*)    All other components within normal limits  CBC WITH DIFFERENTIAL/PLATELET - Abnormal; Notable for the following components:   RBC 3.87 (*)    Hemoglobin 10.5 (*)    HCT 31.2 (*)    RDW 19.0 (*)    Platelets 117 (*)    Lymphs Abs 0.7 (*)    All other components within normal limits  TROPONIN I - Abnormal; Notable for the following components:   Troponin I 0.06 (*)    All other components within normal limits  BRAIN NATRIURETIC PEPTIDE - Abnormal; Notable for the following components:   B Natriuretic Peptide 1,468.0 (*)    All other components within normal limits  LIPASE, BLOOD  URINALYSIS, COMPLETE (UACMP) WITH MICROSCOPIC   ____________________________________________  EKG  ED ECG  REPORT I, Arta Silence, the attending physician, personally viewed and interpreted this ECG.  Date: 11/04/2017 EKG Time: 1206 Rate: 88 Rhythm: normal sinus rhythm QRS Axis: normal Intervals: Borderline PR, incomplete RBBB, prolonged QT ST/T Wave abnormalities: normal Narrative Interpretation: no evidence of acute ischemia  ____________________________________________  RADIOLOGY  CXR: Cardiomegaly with mild pulmonary vascular congestion CT abdomen: Abdominal ascites, small left pleural effusion, borderline retroperitoneal adenopathy  ____________________________________________   PROCEDURES  Procedure(s) performed: No  Procedures  Critical Care performed: No ____________________________________________   INITIAL IMPRESSION / ASSESSMENT AND PLAN / ED COURSE  Pertinent labs & imaging results that were available during my care of the patient were reviewed by me and considered in my medical decision making (see chart for details).  67 year old male with history of ESRD on dialysis and other PMH as noted above presents with multiple symptoms over the last day, including generalized weakness, increased shortness of breath, some lower abdominal discomfort, and vomiting.  These persisted despite him getting dialysis today.  On exam, the vital signs are normal.  The patient is somewhat uncomfortable but not acutely ill-appearing.  The remainder the exam is as described above.  Differential includes fluid overload, pulmonary edema or effusion, worsening ascites, UTI/cystitis, electrolyte abnormality or other metabolic cause, or other source of infection.  Given the overall benign abdominal exam and lack of fever there is no clinical evidence for SBP.  Plan: Chest x-ray, CT abdomen, lab work-up, UA, and reassess.    ----------------------------------------- 4:42 PM on 11/04/2017 -----------------------------------------  CT abdomen shows no concerning acute findings.   Chest x-ray shows some vascular congestion but no significant acute change.  The lab work-up is largely unremarkable.  Patient has multiple mild electrolyte abnormalities and elevated alk phos, however these are consistent with his baseline.  Patient's troponin is also minimally elevated but this is consistent with a normal finding for a dialysis patient and patient's prior labs.  Patient was unable to provide urine, however given the lack of elevated WBC count  or fever, and the intermittent nature of the symptoms, there is no clinical evidence for UTI/cystitis at this time.  On reassessment, the patient states he is feeling much better and would like to go home.  He has not vomited since the beginning of the ED visit.  Given that he has remained stable and his work-up has not revealed any concerning acute findings, I believe that it is safe to discharge the patient home.  He agrees to follow-up with his primary care doctor and go to dialysis as scheduled in 2 days.  Return precautions given, and he expresses understanding. ____________________________________________   FINAL CLINICAL IMPRESSION(S) / ED DIAGNOSES  Final diagnoses:  Generalized weakness  Non-intractable vomiting with nausea, unspecified vomiting type      NEW MEDICATIONS STARTED DURING THIS VISIT:  New Prescriptions   No medications on file     Note:  This document was prepared using Dragon voice recognition software and may include unintentional dictation errors.    Arta Silence, MD 11/04/17 [id number]

## 2017-11-21 ENCOUNTER — Encounter: Payer: Self-pay | Admitting: Podiatry

## 2017-11-21 ENCOUNTER — Ambulatory Visit: Payer: Medicare HMO | Admitting: Podiatry

## 2017-11-21 DIAGNOSIS — L97422 Non-pressure chronic ulcer of left heel and midfoot with fat layer exposed: Secondary | ICD-10-CM

## 2017-11-21 DIAGNOSIS — M79674 Pain in right toe(s): Secondary | ICD-10-CM

## 2017-11-21 DIAGNOSIS — B351 Tinea unguium: Secondary | ICD-10-CM

## 2017-11-21 DIAGNOSIS — E1159 Type 2 diabetes mellitus with other circulatory complications: Secondary | ICD-10-CM | POA: Diagnosis not present

## 2017-11-21 DIAGNOSIS — M79675 Pain in left toe(s): Secondary | ICD-10-CM

## 2017-11-21 DIAGNOSIS — E1142 Type 2 diabetes mellitus with diabetic polyneuropathy: Secondary | ICD-10-CM

## 2017-11-21 NOTE — Progress Notes (Signed)
This patient presents to the office with chief complaint of an open wound on the top of his left forefoot. He says his skin has been open and blisters have been present since this past Monday. This patient states that he had a stroke on his right side. He says he believed he rubbed  his right foot on his left foot to cause this open wound.   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  He also is on dialysis.  He says he was seen at his primary care physician who looked at the lesion applied back to to trace and and wrapped the foot.  He now presents the office today for continued evaluation and treatment of this open wound and blistered left foot.     General Appearance  Alert, conversant and in no acute stress.  Vascular  Dorsalis pedis and posterior tibial  pulses are not  palpable  bilaterally.  Capillary return is within normal limits  Bilaterally. Cold foot right.  Foot temperature WNL left foot.  Neurologic  Senn-Weinstein monofilament wire test absent  bilaterally.  Nails Thick disfigured discolored nails with subungual debris  from hallux to fifth toes bilaterally. No evidence of bacterial infection or drainage bilaterally.  Orthopedic  No limitations of motion of motion feet .  No crepitus or effusions noted.  No bony pathology or digital deformities noted. Significant swelling feet  B/L.  Skin  normotropic skin with no porokeratosis noted bilaterally.  No signs of infections or ulcers noted.  There is skin necrosis noted on the dorsum of the left foot over the third MPJ.  There is evidence of significant redness and inflammation noted over the fourth MPJ and the fourth toe left foot.  There is maceration noted in the third and fourth interdigital spaces left foot.  There is also a blister noted on the fifth toe left foot.  No pus or drainage noted.    Diabetic ulcer left foot left forefoot dorsally.  Blister fifth toe  left foot.  Masceration left foot. Interdigitally.   ROV.   Examination of his foot reveals a desquamation over the left forefoot dorsally revealing subcutaneous tissue.  Picture of his foot can be seen below.  Silvadene dry sterile dressing was applied.  Patient was told to ambulate with surgical shoe covers until he was seen in one week.  Debride nails  X 10.  Talked to [facility] from Dr. Cherylann Banas and she recommended that no antibiotics be prescribed unless there was an infection.  Since I was considering the use of this medicine for a possible infection,  I will wait until his return next week before ordering antibiotics.     Gardiner Barefoot DPM

## 2017-11-25 ENCOUNTER — Other Ambulatory Visit: Payer: Self-pay

## 2017-11-25 ENCOUNTER — Emergency Department: Payer: Medicare Other

## 2017-11-25 ENCOUNTER — Inpatient Hospital Stay: Payer: Medicare Other

## 2017-11-25 ENCOUNTER — Inpatient Hospital Stay
Admission: EM | Admit: 2017-11-25 | Discharge: 2017-11-29 | DRG: 314 | Disposition: A | Discharge status: Home Health | Payer: Medicare Other | Attending: Specialist | Admitting: Specialist

## 2017-11-25 DIAGNOSIS — Z7901 Long term (current) use of anticoagulants: Secondary | ICD-10-CM | POA: Diagnosis not present

## 2017-11-25 DIAGNOSIS — E1151 Type 2 diabetes mellitus with diabetic peripheral angiopathy without gangrene: Secondary | ICD-10-CM | POA: Diagnosis present

## 2017-11-25 DIAGNOSIS — I5023 Acute on chronic systolic (congestive) heart failure: Secondary | ICD-10-CM | POA: Diagnosis present

## 2017-11-25 DIAGNOSIS — Z888 Allergy status to other drugs, medicaments and biological substances status: Secondary | ICD-10-CM

## 2017-11-25 DIAGNOSIS — I429 Cardiomyopathy, unspecified: Secondary | ICD-10-CM | POA: Diagnosis present

## 2017-11-25 DIAGNOSIS — N2581 Secondary hyperparathyroidism of renal origin: Secondary | ICD-10-CM | POA: Diagnosis not present

## 2017-11-25 DIAGNOSIS — D631 Anemia in chronic kidney disease: Secondary | ICD-10-CM | POA: Diagnosis present

## 2017-11-25 DIAGNOSIS — E1122 Type 2 diabetes mellitus with diabetic chronic kidney disease: Secondary | ICD-10-CM | POA: Diagnosis present

## 2017-11-25 DIAGNOSIS — I132 Hypertensive heart and chronic kidney disease with heart failure and with stage 5 chronic kidney disease, or end stage renal disease: Secondary | ICD-10-CM | POA: Diagnosis not present

## 2017-11-25 DIAGNOSIS — H538 Other visual disturbances: Secondary | ICD-10-CM | POA: Diagnosis present

## 2017-11-25 DIAGNOSIS — I639 Cerebral infarction, unspecified: Secondary | ICD-10-CM | POA: Diagnosis present

## 2017-11-25 DIAGNOSIS — N186 End stage renal disease: Secondary | ICD-10-CM | POA: Diagnosis present

## 2017-11-25 DIAGNOSIS — Z8349 Family history of other endocrine, nutritional and metabolic diseases: Secondary | ICD-10-CM

## 2017-11-25 DIAGNOSIS — I6521 Occlusion and stenosis of right carotid artery: Secondary | ICD-10-CM | POA: Diagnosis present

## 2017-11-25 DIAGNOSIS — E785 Hyperlipidemia, unspecified: Secondary | ICD-10-CM | POA: Diagnosis present

## 2017-11-25 DIAGNOSIS — Z833 Family history of diabetes mellitus: Secondary | ICD-10-CM

## 2017-11-25 DIAGNOSIS — Z87891 Personal history of nicotine dependence: Secondary | ICD-10-CM

## 2017-11-25 DIAGNOSIS — R531 Weakness: Secondary | ICD-10-CM | POA: Diagnosis present

## 2017-11-25 DIAGNOSIS — R188 Other ascites: Secondary | ICD-10-CM | POA: Diagnosis not present

## 2017-11-25 DIAGNOSIS — R1310 Dysphagia, unspecified: Secondary | ICD-10-CM | POA: Diagnosis present

## 2017-11-25 DIAGNOSIS — Z955 Presence of coronary angioplasty implant and graft: Secondary | ICD-10-CM | POA: Diagnosis not present

## 2017-11-25 DIAGNOSIS — I252 Old myocardial infarction: Secondary | ICD-10-CM

## 2017-11-25 DIAGNOSIS — E559 Vitamin D deficiency, unspecified: Secondary | ICD-10-CM | POA: Diagnosis present

## 2017-11-25 DIAGNOSIS — Z830 Family history of human immunodeficiency virus [HIV] disease: Secondary | ICD-10-CM

## 2017-11-25 DIAGNOSIS — Z992 Dependence on renal dialysis: Secondary | ICD-10-CM

## 2017-11-25 DIAGNOSIS — Z8673 Personal history of transient ischemic attack (TIA), and cerebral infarction without residual deficits: Secondary | ICD-10-CM

## 2017-11-25 DIAGNOSIS — I959 Hypotension, unspecified: Secondary | ICD-10-CM | POA: Diagnosis not present

## 2017-11-25 DIAGNOSIS — I2510 Atherosclerotic heart disease of native coronary artery without angina pectoris: Secondary | ICD-10-CM | POA: Diagnosis present

## 2017-11-25 DIAGNOSIS — Z7982 Long term (current) use of aspirin: Secondary | ICD-10-CM

## 2017-11-25 DIAGNOSIS — K219 Gastro-esophageal reflux disease without esophagitis: Secondary | ICD-10-CM | POA: Diagnosis present

## 2017-11-25 DIAGNOSIS — Z8249 Family history of ischemic heart disease and other diseases of the circulatory system: Secondary | ICD-10-CM

## 2017-11-25 DIAGNOSIS — Z951 Presence of aortocoronary bypass graft: Secondary | ICD-10-CM

## 2017-11-25 DIAGNOSIS — E1140 Type 2 diabetes mellitus with diabetic neuropathy, unspecified: Secondary | ICD-10-CM | POA: Diagnosis not present

## 2017-11-25 DIAGNOSIS — Z841 Family history of disorders of kidney and ureter: Secondary | ICD-10-CM

## 2017-11-25 HISTORY — DX: Heart failure, unspecified: I50.9

## 2017-11-25 HISTORY — DX: Disorder of kidney and ureter, unspecified: N28.9

## 2017-11-25 LAB — BASIC METABOLIC PANEL
Anion gap: 16 — ABNORMAL HIGH (ref 5–15)
BUN: 14 mg/dL (ref 6–20)
CHLORIDE: 93 mmol/L — AB (ref 101–111)
CO2: 27 mmol/L (ref 22–32)
Calcium: 7.9 mg/dL — ABNORMAL LOW (ref 8.9–10.3)
Creatinine, Ser: 3.4 mg/dL — ABNORMAL HIGH (ref 0.61–1.24)
GFR calc non Af Amer: 18 mL/min — ABNORMAL LOW (ref >60)
GFR, EST AFRICAN AMERICAN: 20 mL/min — AB (ref >60)
Glucose, Bld: 143 mg/dL — ABNORMAL HIGH (ref 65–99)
POTASSIUM: 3.4 mmol/L — AB (ref 3.5–5.1)
SODIUM: 136 mmol/L (ref 135–145)

## 2017-11-25 LAB — CBC
HCT: 30.3 % — ABNORMAL LOW (ref 40.0–52.0)
Hemoglobin: 10.4 g/dL — ABNORMAL LOW (ref 13.0–18.0)
MCH: 26.5 pg (ref 26.0–34.0)
MCHC: 34.1 g/dL (ref 32.0–36.0)
MCV: 77.7 fL — AB (ref 80.0–100.0)
Platelets: 251 10*3/uL (ref 150–440)
RBC: 3.9 MIL/uL — AB (ref 4.40–5.90)
RDW: 16.6 % — AB (ref 11.5–14.5)
WBC: 8.8 10*3/uL (ref 3.8–10.6)

## 2017-11-25 LAB — GLUCOSE, CAPILLARY: GLUCOSE-CAPILLARY: 173 mg/dL — AB (ref 65–99)

## 2017-11-25 LAB — HEPATIC FUNCTION PANEL
ALBUMIN: 2.7 g/dL — AB (ref 3.5–5.0)
ALT: 16 U/L — ABNORMAL LOW (ref 17–63)
AST: 29 U/L (ref 15–41)
Alkaline Phosphatase: 285 U/L — ABNORMAL HIGH (ref 38–126)
BILIRUBIN TOTAL: 1.2 mg/dL (ref 0.3–1.2)
Bilirubin, Direct: 0.6 mg/dL — ABNORMAL HIGH (ref 0.1–0.5)
Indirect Bilirubin: 0.6 mg/dL (ref 0.3–0.9)
TOTAL PROTEIN: 8.2 g/dL — AB (ref 6.5–8.1)

## 2017-11-25 LAB — PROTIME-INR
INR: 1.59
PROTHROMBIN TIME: 18.8 s — AB (ref 11.4–15.2)

## 2017-11-25 MED ORDER — WARFARIN SODIUM 5 MG PO TABS
5.0000 mg | ORAL_TABLET | Freq: Every day | ORAL | Status: AC
  Administered 2017-11-26: 5 mg via ORAL
  Filled 2017-11-25: qty 1

## 2017-11-25 MED ORDER — GABAPENTIN 300 MG PO CAPS
300.0000 mg | ORAL_CAPSULE | Freq: Three times a day (TID) | ORAL | Status: DC
  Administered 2017-11-25 – 2017-11-29 (×11): 300 mg via ORAL
  Filled 2017-11-25 (×11): qty 1

## 2017-11-25 MED ORDER — WARFARIN SODIUM 5 MG PO TABS
7.5000 mg | ORAL_TABLET | Freq: Once | ORAL | Status: DC
  Filled 2017-11-25: qty 1.5

## 2017-11-25 MED ORDER — ASPIRIN EC 81 MG PO TBEC
81.0000 mg | DELAYED_RELEASE_TABLET | Freq: Every day | ORAL | Status: DC
  Administered 2017-11-25 – 2017-11-29 (×5): 81 mg via ORAL
  Filled 2017-11-25 (×5): qty 1

## 2017-11-25 MED ORDER — INSULIN ASPART 100 UNIT/ML ~~LOC~~ SOLN
0.0000 [IU] | Freq: Three times a day (TID) | SUBCUTANEOUS | Status: DC
  Administered 2017-11-26 (×2): 2 [IU] via SUBCUTANEOUS
  Administered 2017-11-26: 1 [IU] via SUBCUTANEOUS
  Administered 2017-11-27 – 2017-11-28 (×4): 2 [IU] via SUBCUTANEOUS
  Administered 2017-11-29: 1 [IU] via SUBCUTANEOUS
  Administered 2017-11-29: 2 [IU] via SUBCUTANEOUS
  Filled 2017-11-25 (×9): qty 1

## 2017-11-25 MED ORDER — METOPROLOL TARTRATE 25 MG PO TABS
25.0000 mg | ORAL_TABLET | Freq: Every day | ORAL | Status: DC
  Administered 2017-11-27 – 2017-11-28 (×2): 25 mg via ORAL
  Filled 2017-11-25 (×3): qty 1

## 2017-11-25 MED ORDER — IOHEXOL 300 MG/ML  SOLN
100.0000 mL | Freq: Once | INTRAMUSCULAR | Status: AC | PRN
  Administered 2017-11-25: 100 mL via INTRAVENOUS

## 2017-11-25 MED ORDER — GUAIFENESIN ER 600 MG PO TB12
600.0000 mg | ORAL_TABLET | Freq: Two times a day (BID) | ORAL | Status: DC
  Administered 2017-11-25 – 2017-11-29 (×8): 600 mg via ORAL
  Filled 2017-11-25 (×8): qty 1

## 2017-11-25 MED ORDER — LORAZEPAM 2 MG/ML IJ SOLN
1.0000 mg | INTRAMUSCULAR | Status: AC
  Administered 2017-11-25: 23:00:00 2 mg via INTRAVENOUS
  Filled 2017-11-25: qty 1

## 2017-11-25 MED ORDER — HYDRALAZINE HCL 50 MG PO TABS
50.0000 mg | ORAL_TABLET | Freq: Two times a day (BID) | ORAL | Status: DC
  Administered 2017-11-27 – 2017-11-28 (×3): 50 mg via ORAL
  Filled 2017-11-25 (×5): qty 1

## 2017-11-25 MED ORDER — WARFARIN - PHARMACIST DOSING INPATIENT
Freq: Every day | Status: DC
  Administered 2017-11-28: 18:00:00

## 2017-11-25 MED ORDER — IOPAMIDOL (ISOVUE-300) INJECTION 61%
30.0000 mL | Freq: Once | INTRAVENOUS | Status: AC | PRN
  Administered 2017-11-25: 30 mL via ORAL

## 2017-11-25 MED ORDER — PANTOPRAZOLE SODIUM 40 MG PO TBEC
40.0000 mg | DELAYED_RELEASE_TABLET | Freq: Two times a day (BID) | ORAL | Status: DC
  Administered 2017-11-25 – 2017-11-29 (×7): 40 mg via ORAL
  Filled 2017-11-25 (×7): qty 1

## 2017-11-25 MED ORDER — ACETAMINOPHEN 650 MG RE SUPP: 650.0000 mg | RECTAL | Status: DC | PRN

## 2017-11-25 MED ORDER — ACETAMINOPHEN 325 MG PO TABS: 650.0000 mg | ORAL_TABLET | ORAL | Status: DC | PRN

## 2017-11-25 MED ORDER — ACETAMINOPHEN 160 MG/5ML PO SOLN
650.0000 mg | ORAL | Status: DC | PRN
Start: 2017-11-25 — End: 2017-11-29
  Administered 2017-11-25: 650 mg
  Filled 2017-11-25 (×2): qty 20.3

## 2017-11-25 MED ORDER — FLORANEX PO PACK
1.0000 g | PACK | Freq: Three times a day (TID) | ORAL | Status: DC
  Administered 2017-11-26 – 2017-11-29 (×9): 1 g via ORAL
  Filled 2017-11-25 (×13): qty 1

## 2017-11-25 MED ORDER — STROKE: EARLY STAGES OF RECOVERY BOOK
Freq: Once | Status: AC
  Administered 2017-11-25: 19:00:00

## 2017-11-25 MED ORDER — ASPIRIN 81 MG PO CHEW
324.0000 mg | CHEWABLE_TABLET | Freq: Once | ORAL | Status: AC
  Administered 2017-11-25: 324 mg via ORAL
  Filled 2017-11-25: qty 4

## 2017-11-25 MED ORDER — WARFARIN SODIUM 7.5 MG PO TABS
7.5000 mg | ORAL_TABLET | Freq: Once | ORAL | Status: AC
Start: 2017-11-25 — End: 2017-11-25
  Administered 2017-11-25: 23:00:00 7.5 mg via ORAL
  Filled 2017-11-25: qty 1

## 2017-11-25 MED ORDER — INSULIN ASPART 100 UNIT/ML ~~LOC~~ SOLN: 0.0000 [IU] | Freq: Three times a day (TID) | SUBCUTANEOUS | Status: DC

## 2017-11-25 MED ORDER — ROSUVASTATIN CALCIUM 20 MG PO TABS
20.0000 mg | ORAL_TABLET | Freq: Every day | ORAL | Status: DC
  Administered 2017-11-25 – 2017-11-28 (×4): 20 mg via ORAL
  Filled 2017-11-25 (×4): qty 1

## 2017-11-25 MED ORDER — RENA-VITE PO TABS
1.0000 | ORAL_TABLET | Freq: Every day | ORAL | Status: DC
  Administered 2017-11-25 – 2017-11-29 (×5): 1 via ORAL
  Filled 2017-11-25 (×5): qty 1

## 2017-11-25 NOTE — ED Triage Notes (Signed)
Pt states during his dialysis he began having loss of vision and lower back pain and did all but the last 5 minutes of dialysis. Pt still has his site accessed on arrival of the left arm. Pt states his vision has improved but still having issues. Pt is a/ox4 on arrival.

## 2017-11-25 NOTE — ED Provider Notes (Signed)
----------------------------------------- 3:44 PM on 11/25/2017 -----------------------------------------   Blood pressure 124/77, pulse 82, temperature 98.1 F (36.7 C), temperature source Oral, resp. rate 18, height 6' (1.829 m), weight 90.7 kg (200 lb), SpO2 96 %.  Assuming care from Dr. Corky Downs of Terry Tyler is a 67 y.o. male with a chief complaint of Loss of Vision .    Please refer to H&P by previous MD for further details.  The current plan of care is to f/u CT head.   I have personally reviewed the images performed during this visit and I agree with the Radiologist's read.   Interpretation by Radiologist:  Ct Head Wo Contrast  Result Date: 11/25/2017 CLINICAL DATA:  Blurry vision and weakness EXAM: CT HEAD WITHOUT CONTRAST TECHNIQUE: Contiguous axial images were obtained from the base of the skull through the vertex without intravenous contrast. COMPARISON:  04/09/2016 FINDINGS: Brain: No evidence of acute infarction, hemorrhage, hydrocephalus, extra-axial collection or mass lesion/mass effect. Vascular: No hyperdense vessel or unexpected calcification. Diffuse calcification of subcutaneous vessels is noted. This is consistent with the patient's given clinical history of end-stage renal disease. Skull: Normal. Negative for fracture or focal lesion. Sinuses/Orbits: No acute finding. Other: None. IMPRESSION: No acute intracranial abnormality noted. Electronically Signed   By: Inez Catalina M.D.   On: 11/25/2017 15:35   Ct Abdomen Pelvis W Contrast  Result Date: 11/25/2017 CLINICAL DATA:  67 year old male experience blurred vision during dialysis. Initial encounter. EXAM: CT ABDOMEN AND PELVIS WITH CONTRAST TECHNIQUE: Multidetector CT imaging of the abdomen and pelvis was performed using the standard protocol following bolus administration of intravenous contrast. CONTRAST:  36mL ISOVUE-300 IOPAMIDOL (ISOVUE-300) INJECTION 61%, 174mL OMNIPAQUE IOHEXOL 300 MG/ML SOLN COMPARISON:   11/04/2017 and 07/13/2009 CT CT. FINDINGS: Lower chest: Mild basilar atelectasis with small left-sided pleural effusion. Cardiomegaly.  Prominent 3 vessel coronary artery calcification. Hepatobiliary: No worrisome hepatic lesion. Possible 4 mm cyst posterior right lobe. 6 mm calcified gallstone. Pancreas: No worrisome pancreatic mass. Spleen: Slightly elongated spleen spanning over 13.6 cm. No focal splenic mass. Adrenals/Urinary Tract: Poorly functioning atrophic kidneys. Low-density structures suggestive of renal cysts, some too small to characterize and 1 appearing calcific (lower pole right kidney). Mild adrenal gland hyperplasia without adrenal mass. Contracted noncontrast filled urinary bladder with limited evaluation. Stomach/Bowel: Evaluation of bowel limited by prominent ascites and areas of under distension. Vascular/Lymphatic: Prominent atherosclerotic changes aorta and aortic branch vessels. No abdominal aortic aneurysm or large vessel occlusion. Once again noted are increase number of normal to slightly prominent size upper abdominal and retroperitoneal lymph nodes. Reproductive: No acute abnormality. Other: No free air or bowel containing hernia. Third spacing of fluid. Musculoskeletal: No osseous destructive lesion. Mild sclerosis femoral heads without definitive findings of avascular necrosis. Partial fusion sacroiliac joints. Degenerative changes lumbar spine most notable involving L3-4 and L4-5 facet joints. IMPRESSION: Prominent ascites limits evaluation of bowel. Third spacing of fluid. Aortic Atherosclerosis (ICD10-I70.0). Prominent atherosclerotic changes aortic branch vessels and coronary arteries. Top-normal to slightly enlarged upper abdominal and retroperitoneal lymph nodes once again noted, possibly reactive in origin. Slightly elongated spleen spanning over 13.6 cm. 6 mm calcified gallstone. Poorly functioning kidneys. Low-density structures suggestive of renal cysts although some too  small to characterize. One right renal cyst is partially calcified. Cardiomegaly. Electronically Signed   By: Genia Del M.D.   On: 11/25/2017 14:36     CT head negative for acute findings. Plan per Dr. Corky Downs to admit to Hospitalist for stroke eval. Patient given full dose  ASA.        Rudene Re, MD 11/25/17 [phone]

## 2017-11-25 NOTE — H&P (Signed)
Terry Tyler    MR#:  [id number]  DATE OF BIRTH:  09/07/1950  DATE OF ADMISSION:  11/25/2017  PRIMARY CARE PHYSICIAN: Elisabeth Cara, NP   REQUESTING/REFERRING PHYSICIAN: Lavonia Drafts MD  CHIEF COMPLAINT:   Chief Complaint  Patient presents with  . Loss of Vision    HISTORY OF PRESENT ILLNESS: Terry Tyler  is a 67 y.o. male with a known history of end-stage renal disease, diabetes type 2, essential hypertension, hyperlipidemia, coronary artery disease, peripheral vascular disease who was in his dialysis session when he started developing bilateral blurred vision.  His vision is now back to normal.  Patient also reports that he has had trouble with his right lower extremity intermittently being weak today is much worse unable to move that right lower extremity much.  He otherwise denies any chest pain shortness of breath no palpitations no syncope.  Patient in the ER was also complaining of abdominal distention and tightening of the abdomen.  PAST MEDICAL HISTORY:   Past Medical History:  Diagnosis Date  . C. difficile diarrhea   . CHF (congestive heart failure) ([place])   . CKD (chronic kidney disease)   . Coronary atherosclerosis of unspecified type of vessel, native or graft    Myocardial infarction in 2006. Cardiac catheterization showed significant three-vessel coronary artery disease. He underwent [facility]G at [facility]. Most recent cardiac catheterization in 2010 showed normal LV systolic function, patent grafts to OM1, RCA and LAD. Occluded SVG to diagonal.  . 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 Dialysis patient [id number])    Mon. Wed. Fri. for dialysis  . Hyperlipidemia   . Hypertension   . Hypertension, renal disease   . Impotence of organic origin   . Keloid scar   . Myocardial infarction ([place]) 2006  . Neoplasm of uncertain behavior, site unspecified   . Peripheral vascular disease,  unspecified ([place])   . Proteinuria   . Renal insufficiency   . Stroke [facility]) 04/2016   TIA  . Unspecified vitamin D deficiency     PAST SURGICAL HISTORY:  Past Surgical History:  Procedure Laterality Date  . A/V FISTULAGRAM Left 01/03/2017   Procedure: A/V Fistulagram;  Surgeon: Algernon Huxley, MD;  Location: [place] CV LAB;  Service: Cardiovascular;  Laterality: Left;  . A/V FISTULAGRAM Left 06/06/2017   Procedure: A/V FISTULAGRAM;  Surgeon: Algernon Huxley, MD;  Location: [place] CV LAB;  Service: Cardiovascular;  Laterality: Left;  . A/V FISTULAGRAM Left 09/02/2017   Procedure: A/V FISTULAGRAM;  Surgeon: Algernon Huxley, MD;  Location: [place] CV LAB;  Service: Cardiovascular;  Laterality: Left;  . A/V SHUNT INTERVENTION N/A 01/03/2017   Procedure: A/V Shunt Intervention;  Surgeon: Algernon Huxley, MD;  Location: [place] CV LAB;  Service: Cardiovascular;  Laterality: N/A;  . A/V SHUNT INTERVENTION N/A 06/06/2017   Procedure: A/V SHUNT INTERVENTION;  Surgeon: Algernon Huxley, MD;  Location: [place] CV LAB;  Service: Cardiovascular;  Laterality: N/A;  . AV FISTULA PLACEMENT Left 10/25/2016   Procedure: ARTERIOVENOUS (AV) FISTULA CREATION ( RADIOCEPHALIC );  Surgeon: Algernon Huxley, MD;  Location: [facility] ORS;  Service: Vascular;  Laterality: Left;  . CARDIAC CATHETERIZATION  12/2004   [facility]  . CARDIAC CATHETERIZATION  09/2008   [facility];[place]  . COLONOSCOPY WITH PROPOFOL N/A 05/09/2017   Procedure: COLONOSCOPY WITH PROPOFOL;  Surgeon: Alice Reichert, Benay Pike, MD;  Location: [facility]  ENDOSCOPY;  Service: Gastroenterology;  Laterality: N/A;  . CORONARY ANGIOPLASTY    . CORONARY ARTERY BYPASS GRAFT  2006   [facility]  . CORONARY STENT INTERVENTION N/A 09/10/2016   Procedure: Coronary Stent Intervention;  Surgeon: Isaias Cowman, MD;  Location: [place] CV LAB;  Service: Cardiovascular;  Laterality: N/A;  . DIALYSIS/PERMA CATHETER REMOVAL N/A 02/14/2017   Procedure: DIALYSIS/PERMA  CATHETER REMOVAL;  Surgeon: Algernon Huxley, MD;  Location: [place] CV LAB;  Service: Cardiovascular;  Laterality: N/A;  . ESOPHAGOGASTRODUODENOSCOPY (EGD) WITH PROPOFOL N/A 05/08/2017   Procedure: ESOPHAGOGASTRODUODENOSCOPY (EGD) WITH PROPOFOL;  Surgeon: Toledo, Benay Pike, MD;  Location: [facility] ENDOSCOPY;  Service: Gastroenterology;  Laterality: N/A;  . keloid removal     right neck  . LEFT HEART CATH AND CORONARY ANGIOGRAPHY N/A 09/10/2016   Procedure: Left Heart Cath and Coronary Angiography;  Surgeon: Isaias Cowman, MD;  Location: [place] CV LAB;  Service: Cardiovascular;  Laterality: N/A;    SOCIAL HISTORY:  Social History   Tobacco Use  . Smoking status: Former Smoker    Packs/day: 0.50    Years: 0.00    Pack years: 0.00    Types: Cigarettes    Last attempt to quit: 06/11/1980    Years since quitting: 37.4  . Smokeless tobacco: Never Used  Substance Use Topics  . Alcohol use: No    Comment: socially    FAMILY HISTORY:  Family History  Problem Relation Age of Onset  . Hypertension Mother   . Hyperlipidemia Mother   . Heart disease Mother   . Heart disease Father   . Hyperlipidemia Father   . Hypertension Father   . Kidney disease Sister   . Diabetes Sister   . Hypertension Sister   . Diabetes Brother   . Hypertension Brother   . HIV/AIDS Brother     DRUG ALLERGIES:  Allergies  Allergen Reactions  . Lisinopril Swelling  . Atorvastatin Calcium Other (See Comments)    Muscle cramps   . Ezetimibe Other (See Comments)    Cannot remember what the allergy was.  . Lipitor [Atorvastatin] Other (See Comments)    Muscle cramping    REVIEW OF SYSTEMS:   CONSTITUTIONAL: No fever, fatigue or weakness.  EYES: No blurred or double vision.  EARS, NOSE, AND THROAT: No tinnitus or ear pain.  RESPIRATORY: No cough, shortness of breath, wheezing or hemoptysis.  CARDIOVASCULAR: No chest pain, orthopnea, edema.  GASTROINTESTINAL: No nausea, vomiting, diarrhea or  abdominal pain.  Positive abdominal distention GENITOURINARY: No dysuria, hematuria.  ENDOCRINE: No polyuria, nocturia,  HEMATOLOGY: No anemia, easy bruising or bleeding SKIN: No rash or lesion. MUSCULOSKELETAL: No joint pain or arthritis.   NEUROLOGIC: Blurred vision as well as right lower extremity weakness PSYCHIATRY: No anxiety or depression.   MEDICATIONS AT HOME:  Prior to Admission medications   Medication Sig Start Date End Date Taking? Authorizing Provider  aspirin EC 81 MG EC tablet Take 1 tablet (81 mg total) by mouth daily. Patient taking differently: Take 81 mg by mouth at bedtime.  07/22/17   Salary, Avel Peace, MD  gabapentin (NEURONTIN) 300 MG capsule Take 300 mg by mouth 3 (three) times daily.    [provider]  guaiFENesin (MUCINEX) 600 MG 12 hr tablet Take 1 tablet (600 mg total) by mouth 2 (two) times daily. Patient not taking: Reported on 11/04/2017 10/12/17   Gladstone Lighter, MD  hydrALAZINE (APRESOLINE) 50 MG tablet Take 50 mg by mouth 2 (two) times daily.  [provider]  lactobacillus (FLORANEX/LACTINEX) PACK Take 1 packet (1 g total) by mouth 3 (three) times daily with meals. Patient taking differently: Take 1 g by mouth daily.  07/22/17   Salary, Avel Peace, MD  LOPERAMIDE A-D 2 MG tablet Take 1 tablet (2 mg total) by mouth 3 (three) times daily as needed for diarrhea or loose stools. 10/12/17   Gladstone Lighter, MD  metoprolol tartrate (LOPRESSOR) 25 MG tablet Take 25 mg by mouth at bedtime.     [provider]  mometasone-formoterol (DULERA) 200-5 MCG/ACT AERO Inhale 2 puffs into the lungs 2 (two) times daily. Patient not taking: Reported on 11/04/2017 10/12/17   Gladstone Lighter, MD  multivitamin (RENA-VIT) TABS tablet Take 1 tablet by mouth daily. 08/19/17   [provider]  pantoprazole (PROTONIX) 40 MG tablet Take 1 tablet (40 mg total) by mouth 2 (two) times daily before a meal. 05/10/17   Gladstone Lighter, MD   rosuvastatin (CRESTOR) 20 MG tablet Take 20 mg by mouth at bedtime.     [provider]  warfarin (COUMADIN) 5 MG tablet Take 1 tablet (5 mg total) by mouth at bedtime. 10/13/17   Gladstone Lighter, MD      PHYSICAL EXAMINATION:   VITAL SIGNS: Blood pressure 124/77, pulse 82, temperature 98.1 F (36.7 C), temperature source Oral, resp. rate 18, height 6' (1.829 m), weight 90.7 kg (200 lb), SpO2 96 %.  GENERAL:  67 y.o.-year-old patient lying in the bed with no acute distress.  EYES: Pupils equal, round, reactive to light and accommodation. No scleral icterus. Extraocular muscles intact.  HEENT: Head atraumatic, normocephalic. Oropharynx and nasopharynx clear.  NECK:  Supple, no jugular venous distention. No thyroid enlargement, no tenderness.  LUNGS: Normal breath sounds bilaterally, no wheezing, rales,rhonchi or crepitation. No use of accessory muscles of respiration.  CARDIOVASCULAR: S1, S2 normal. No murmurs, rubs, or gallops.  ABDOMEN: Soft, nontender but very distended bowel sounds present. No organomegaly or mass.  EXTREMITIES: 2+ pedal edema, cyanosis, or clubbing.  NEUROLOGIC: Cranial nerves II through XII are intact.  Right lower extremity strength is 3 out of 5 PSYCHIATRIC: The patient is alert and oriented x 3.  SKIN: No obvious rash, lesion, or ulcer.   LABORATORY PANEL:   CBC Recent Labs  Lab 11/25/17 1144  WBC 8.8  HGB 10.4*  HCT 30.3*  PLT 251  MCV 77.7*  MCH 26.5  MCHC 34.1  RDW 16.6*   ------------------------------------------------------------------------------------------------------------------  Chemistries  Recent Labs  Lab 11/25/17 1144  NA 136  K 3.4*  CL 93*  CO2 27  GLUCOSE 143*  BUN 14  CREATININE 3.40*  CALCIUM 7.9*  AST 29  ALT 16*  ALKPHOS 285*  BILITOT 1.2   ------------------------------------------------------------------------------------------------------------------ estimated creatinine clearance is 24.1 mL/min (A)  (by C-G formula based on SCr of 3.4 mg/dL (H)). ------------------------------------------------------------------------------------------------------------------ No results for input(s): TSH, T4TOTAL, T3FREE, THYROIDAB in the last 72 hours.  Invalid input(s): FREET3   Coagulation profile No results for input(s): INR, PROTIME in the last 168 hours. ------------------------------------------------------------------------------------------------------------------- No results for input(s): DDIMER in the last 72 hours. -------------------------------------------------------------------------------------------------------------------  Cardiac Enzymes No results for input(s): CKMB, TROPONINI, MYOGLOBIN in the last 168 hours.  Invalid input(s): CK ------------------------------------------------------------------------------------------------------------------ Invalid input(s): POCBNP  ---------------------------------------------------------------------------------------------------------------  Urinalysis    Component Value Date/Time   COLORURINE YELLOW (A) 09/09/2016 1202   APPEARANCEUR CLEAR (A) 09/09/2016 1202   LABSPEC 1.008 09/09/2016 1202   PHURINE 7.0 09/09/2016 1202   GLUCOSEU >=500 (A) 09/09/2016 1202  HGBUR NEGATIVE 09/09/2016 1202   BILIRUBINUR NEGATIVE 09/09/2016 1202   KETONESUR NEGATIVE 09/09/2016 1202   PROTEINUR 100 (A) 09/09/2016 1202   NITRITE NEGATIVE 09/09/2016 1202   LEUKOCYTESUR NEGATIVE 09/09/2016 1202     RADIOLOGY: Ct Head Wo Contrast  Result Date: 11/25/2017 CLINICAL DATA:  Blurry vision and weakness EXAM: CT HEAD WITHOUT CONTRAST TECHNIQUE: Contiguous axial images were obtained from the base of the skull through the vertex without intravenous contrast. COMPARISON:  04/09/2016 FINDINGS: Brain: No evidence of acute infarction, hemorrhage, hydrocephalus, extra-axial collection or mass lesion/mass effect. Vascular: No hyperdense vessel or unexpected  calcification. Diffuse calcification of subcutaneous vessels is noted. This is consistent with the patient's given clinical history of end-stage renal disease. Skull: Normal. Negative for fracture or focal lesion. Sinuses/Orbits: No acute finding. Other: None. IMPRESSION: No acute intracranial abnormality noted. Electronically Signed   By: Inez Catalina M.D.   On: 11/25/2017 15:35   Ct Abdomen Pelvis W Contrast  Result Date: 11/25/2017 CLINICAL DATA:  67 year old male experience blurred vision during dialysis. Initial encounter. EXAM: CT ABDOMEN AND PELVIS WITH CONTRAST TECHNIQUE: Multidetector CT imaging of the abdomen and pelvis was performed using the standard protocol following bolus administration of intravenous contrast. CONTRAST:  62mL ISOVUE-300 IOPAMIDOL (ISOVUE-300) INJECTION 61%, 140mL OMNIPAQUE IOHEXOL 300 MG/ML SOLN COMPARISON:  11/04/2017 and 07/13/2009 CT CT. FINDINGS: Lower chest: Mild basilar atelectasis with small left-sided pleural effusion. Cardiomegaly.  Prominent 3 vessel coronary artery calcification. Hepatobiliary: No worrisome hepatic lesion. Possible 4 mm cyst posterior right lobe. 6 mm calcified gallstone. Pancreas: No worrisome pancreatic mass. Spleen: Slightly elongated spleen spanning over 13.6 cm. No focal splenic mass. Adrenals/Urinary Tract: Poorly functioning atrophic kidneys. Low-density structures suggestive of renal cysts, some too small to characterize and 1 appearing calcific (lower pole right kidney). Mild adrenal gland hyperplasia without adrenal mass. Contracted noncontrast filled urinary bladder with limited evaluation. Stomach/Bowel: Evaluation of bowel limited by prominent ascites and areas of under distension. Vascular/Lymphatic: Prominent atherosclerotic changes aorta and aortic branch vessels. No abdominal aortic aneurysm or large vessel occlusion. Once again noted are increase number of normal to slightly prominent size upper abdominal and retroperitoneal lymph  nodes. Reproductive: No acute abnormality. Other: No free air or bowel containing hernia. Third spacing of fluid. Musculoskeletal: No osseous destructive lesion. Mild sclerosis femoral heads without definitive findings of avascular necrosis. Partial fusion sacroiliac joints. Degenerative changes lumbar spine most notable involving L3-4 and L4-5 facet joints. IMPRESSION: Prominent ascites limits evaluation of bowel. Third spacing of fluid. Aortic Atherosclerosis (ICD10-I70.0). Prominent atherosclerotic changes aortic branch vessels and coronary arteries. Top-normal to slightly enlarged upper abdominal and retroperitoneal lymph nodes once again noted, possibly reactive in origin. Slightly elongated spleen spanning over 13.6 cm. 6 mm calcified gallstone. Poorly functioning kidneys. Low-density structures suggestive of renal cysts although some too small to characterize. One right renal cyst is partially calcified. Cardiomegaly. Electronically Signed   By: Genia Del M.D.   On: 11/25/2017 14:36    EKG: Orders placed or performed during the hospital encounter of 11/25/17  . ED EKG  . ED EKG    IMPRESSION AND PLAN: Patient is a 67 year old with multiple medical problems presents with strokelike symptoms  1.  Acute CVA We will obtain MRI MRA of the brain Carotid Dopplers Obtain echocardiogram of the heart Neurology consult Continue aspirin  2.  Ascites etiology unclear no history of alcohol use in the past I will have radiology do ultrasound-guided paracentesis Obtain hepatitis panel  3.  Intermittent right lower  extremity weakness worse with intermittent back pain obtain MRI of the lower back  4.  End-stage renal disease nephrology consult for dialysis  5.  Diabetes type 2 placed on sliding scale insulin Check a hemoglobin A1c  6.  Essential hypertension continue hydralazine Lopressor  7.  Hyperlipidemia continue Crestor   8.  GERD continue Protonix  9.  Miscellaneous patient is on  chronic Coumadin I am unable to find in his record why he is on this however we will continue this for now  All the records are reviewed and case discussed with ED provider. Management plans discussed with the patient, family and they are in agreement.  CODE STATUS: Code Status History    Date Active Date Inactive Code Status Order ID Comments User Context   10/11/2017 0424 10/13/2017 1545 Full Code [id number]  Lance Coon, MD Inpatient   07/16/2017 1133 07/22/2017 2033 Full Code [id number]  Hillary Bow, MD ED   05/07/2017 1320 05/10/2017 2128 Full Code [id number]  Henreitta Leber, MD Inpatient   02/19/2017 1535 02/21/2017 1525 Full Code [id number]  Vaughan Basta, MD Inpatient   01/03/2017 0926 01/03/2017 1426 Full Code [id number]  Algernon Huxley, MD Inpatient   09/08/2016 1758 09/11/2016 1425 Full Code [id number]  Vaughan Basta, MD Inpatient   04/10/2016 0035 04/11/2016 1525 Full Code [id number]  Harvie Bridge, DO Inpatient   04/10/2016 0035 04/10/2016 0035 Full Code [id number]  Hugelmeyer, Ubaldo Glassing, DO Inpatient       TOTAL TIME TAKING CARE OF THIS PATIENT:55 minutes.    Dustin Flock M.D on 11/25/2017 at 3:54 PM  Between 7am to 6pm - Pager - [phone]  After 6pm go to [email or web address] - password [email or web address] [facility]  [phone]  CC: Primary care physician; Elisabeth Cara, NP

## 2017-11-25 NOTE — ED Notes (Signed)
Pt reports that he has not had full strength of his right leg for a couple weeks.

## 2017-11-25 NOTE — Progress Notes (Signed)
ANTICOAGULATION CONSULT NOTE - Initial Consult  Pharmacy Consult for  Warfarin  Indication: atrial fibrillation  Allergies  Allergen Reactions  . Lisinopril Swelling  . Atorvastatin Calcium Other (See Comments)    Muscle cramps   . Ezetimibe Other (See Comments)    Cannot remember what the allergy was.  . Lipitor [Atorvastatin] Other (See Comments)    Muscle cramping    Patient Measurements: Height: 6' (182.9 cm) Weight: 200 lb (90.7 kg) IBW/kg (Calculated) : 77.6 Heparin Dosing Weight:   Vital Signs: Temp: 98 F (36.7 C) (06/17 2036) Temp Source: Oral (06/17 2036) BP: 94/66 (06/17 2036) Pulse Rate: 89 (06/17 2036)  Labs: Recent Labs    11/25/17 1144 11/25/17 1952  HGB 10.4*  --   HCT 30.3*  --   PLT 251  --   LABPROT  --  18.8*  INR  --  1.59  CREATININE 3.40*  --     Estimated Creatinine Clearance: 24.1 mL/min (A) (by C-G formula based on SCr of 3.4 mg/dL (H)).   Medical History: Past Medical History:  Diagnosis Date  . C. difficile diarrhea   . CHF (congestive heart failure) ([place])   . CKD (chronic kidney disease)   . Coronary atherosclerosis of unspecified type of vessel, native or graft    Myocardial infarction in 2006. Cardiac catheterization showed significant three-vessel coronary artery disease. He underwent [facility]G at [facility]. Most recent cardiac catheterization in 2010 showed normal LV systolic function, patent grafts to OM1, RCA and LAD. Occluded SVG to diagonal.  . 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 Dialysis patient [facility])    Mon. Wed. Fri. for dialysis  . Hyperlipidemia   . Hypertension   . Hypertension, renal disease   . Impotence of organic origin   . Keloid scar   . Myocardial infarction ([place]) 2006  . Neoplasm of uncertain behavior, site unspecified   . Peripheral vascular disease, unspecified ([place])   . Proteinuria   . Renal insufficiency   . Stroke [facility]) 04/2016   TIA  . Unspecified vitamin D deficiency      Medications:  Medications Prior to Admission  Medication Sig Dispense Refill Last Dose  . aspirin EC 81 MG EC tablet Take 1 tablet (81 mg total) by mouth daily. (Patient taking differently: Take 81 mg by mouth at bedtime. ) 180 tablet 0 11/03/2017 at 2000  . gabapentin (NEURONTIN) 300 MG capsule Take 300 mg by mouth 3 (three) times daily.   11/03/2017 at 2000  . hydrALAZINE (APRESOLINE) 50 MG tablet Take 50 mg by mouth 2 (two) times daily.    11/03/2017 at 2000  . lactobacillus (FLORANEX/LACTINEX) PACK Take 1 packet (1 g total) by mouth 3 (three) times daily with meals. (Patient taking differently: Take 1 g by mouth daily. ) 30 packet 0 11/03/2017 at 0800  . metoprolol tartrate (LOPRESSOR) 25 MG tablet Take 25 mg by mouth at bedtime.    11/03/2017 at 2000  . multivitamin (RENA-VIT) TABS tablet Take 1 tablet by mouth daily.  4 11/03/2017 at 0800  . pantoprazole (PROTONIX) 40 MG tablet Take 1 tablet (40 mg total) by mouth 2 (two) times daily before a meal. 60 tablet 1 11/04/2017 at 0600  . rosuvastatin (CRESTOR) 20 MG tablet Take 20 mg by mouth at bedtime.    11/03/2017 at 2000  . warfarin (COUMADIN) 5 MG tablet Take 1 tablet (5 mg total) by mouth at bedtime. 30 tablet 0 11/03/2017 at 2000  .  guaiFENesin (MUCINEX) 600 MG 12 hr tablet Take 1 tablet (600 mg total) by mouth 2 (two) times daily. (Patient not taking: Reported on 11/04/2017) 30 tablet 0 Not Taking at Unknown time  . LOPERAMIDE A-D 2 MG tablet Take 1 tablet (2 mg total) by mouth 3 (three) times daily as needed for diarrhea or loose stools. (Patient not taking: Reported on 11/25/2017) 30 tablet 0 Not Taking at Unknown time  . mometasone-formoterol (DULERA) 200-5 MCG/ACT AERO Inhale 2 puffs into the lungs 2 (two) times daily. (Patient not taking: Reported on 11/04/2017) 1 Inhaler 0 Not Taking at Unknown time    Assessment: Pharmacy consulted to dose warfarin in this 67 year old male with Afib. Pt was on warfarin 5 mg PO daily at home. 6/17 :  INR  = 1.59 ,  No warfarin given on 6/17 at time of INR draw  Goal of Therapy:  INR 2-3   Plan:  Warfarin 7.5 mg PO X 1 ordered to be given on 6/17 . Will recheck INR on 6/18 with AM labs. Warfarin 5 mg PO Q24H ordered to resume on 6/18 .   Henritta Mutz D 11/25/2017,9:48 PM

## 2017-11-25 NOTE — ED Provider Notes (Addendum)
[facility] Emergency Department Provider Note   ____________________________________________    I have reviewed the triage vital signs and the nursing notes.   HISTORY  Chief Complaint Loss of Vision     HPI Terry Tyler is a 66 y.o. male with history of end-stage renal disease, diabetes, CAD, CHF who presents from dialysis because of blurry vision and weakness.  Patient reports he was about 90% done with dialysis when he developed blurry vision bilaterally and felt quite weak.  States he felt well at the beginning of dialysis.  Currently states he feels much better and his vision is normal.  He also states he is concerned because his abdomen is more distended than usual but he denies abdominal pain.  No history of liver disease reported.  No fevers or chills.  Neuro deficits   Past Medical History:  Diagnosis Date  . C. difficile diarrhea   . CHF (congestive heart failure) ([place])   . CKD (chronic kidney disease)   . Coronary atherosclerosis of unspecified type of vessel, native or graft    Myocardial infarction in 2006. Cardiac catheterization showed significant three-vessel coronary artery disease. He underwent CABG at [facility]. Most recent cardiac catheterization in 2010 showed normal LV systolic function, patent grafts to OM1, RCA and LAD. Occluded SVG to diagonal.  . 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 Dialysis patient [facility])    Mon. Wed. Fri. for dialysis  . Hyperlipidemia   . Hypertension   . Hypertension, renal disease   . Impotence of organic origin   . Keloid scar   . Myocardial infarction ([place]) 2006  . Neoplasm of uncertain behavior, site unspecified   . Peripheral vascular disease, unspecified ([place])   . Proteinuria   . Renal insufficiency   . Stroke [facility]) 04/2016   TIA  . Unspecified vitamin D deficiency     Patient Active Problem List   Diagnosis Date Noted  . Pneumonia 10/12/2017  . HCAP  (healthcare-associated pneumonia) 10/11/2017  . CHF (congestive heart failure) ([place]) 07/16/2017  . GI bleed 05/07/2017  . 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 Hyperbilirubinemia 04/10/2017  . Diarrhea 02/20/2017  . C. difficile diarrhea 02/19/2017  . Diarrhea of presumed infectious origin 11/01/2016  . ESRD on dialysis ([place]) 10/16/2016  . Fatigue 10/02/2016  . Leukocytosis 10/02/2016  . Nausea & vomiting 10/02/2016  . Inferior myocardial infarction ([place]) 09/18/2016  . Elevated troponin 09/08/2016  . CVA (cerebral vascular accident) ([place]) 04/09/2016  . Hyperphosphatemia 08/09/2015  . Secondary hyperparathyroidism ([place]) 08/09/2015  . Chronic kidney disease (CKD) stage G4/A1, severely decreased glomerular filtration rate (GFR) between 15-29 mL/min/1.73 square meter and albuminuria creatinine ratio less than 30 mg/g (HCC) 05/31/2015  . Bilateral carotid artery stenosis 07/24/2014  . Hypertensive heart disease without heart failure 07/24/2014  . Nonrheumatic mitral valve insufficiency 07/24/2014  . Stenosis of carotid artery 07/24/2014  . Anemia 03/30/2013  . Vitamin D deficiency 03/30/2013  . Obesity 08/28/2012  . PAD (peripheral artery disease) ([place]) 04/10/2012  . CAD (coronary artery disease)   . Hyperlipidemia   . Hypertension   . Proteinuria 06/19/2010  . Type II diabetes mellitus ([place]) 06/19/2010    Past Surgical History:  Procedure Laterality Date  . A/V FISTULAGRAM Left 01/03/2017   Procedure: A/V Fistulagram;  Surgeon: Algernon Huxley, MD;  Location: [place] [facility];  Service: Cardiovascular;  Laterality: Left;  . A/V FISTULAGRAM Left 06/06/2017   Procedure:  A/V FISTULAGRAM;  Surgeon: Algernon Huxley, MD;  Location: [place] [facility];  Service: Cardiovascular;  Laterality: Left;  . A/V FISTULAGRAM Left 09/02/2017   Procedure: A/V FISTULAGRAM;  Surgeon: Algernon Huxley, MD;  Location: [place] [facility];  Service: Cardiovascular;  Laterality: Left;  . A/V  SHUNT INTERVENTION N/A 01/03/2017   Procedure: A/V Shunt Intervention;  Surgeon: Algernon Huxley, MD;  Location: [place] [facility];  Service: Cardiovascular;  Laterality: N/A;  . A/V SHUNT INTERVENTION N/A 06/06/2017   Procedure: A/V SHUNT INTERVENTION;  Surgeon: Algernon Huxley, MD;  Location: [place] [facility];  Service: Cardiovascular;  Laterality: N/A;  . AV FISTULA PLACEMENT Left 10/25/2016   Procedure: ARTERIOVENOUS (AV) FISTULA CREATION ( RADIOCEPHALIC );  Surgeon: Algernon Huxley, MD;  Location: [facility];  Service: Vascular;  Laterality: Left;  . CARDIAC CATHETERIZATION  12/2004   [facility]  . CARDIAC CATHETERIZATION  09/2008   [facility]  . COLONOSCOPY WITH PROPOFOL N/A 05/09/2017   Procedure: COLONOSCOPY WITH PROPOFOL;  Surgeon: Toledo, Benay Pike, MD;  Location: [facility];  Service: Gastroenterology;  Laterality: N/A;  . CORONARY ANGIOPLASTY    . CORONARY ARTERY BYPASS GRAFT  2006   [facility]  . CORONARY STENT INTERVENTION N/A 09/10/2016   Procedure: Coronary Stent Intervention;  Surgeon: Isaias Cowman, MD;  Location: [place] [facility];  Service: Cardiovascular;  Laterality: N/A;  . DIALYSIS/PERMA CATHETER REMOVAL N/A 02/14/2017   Procedure: DIALYSIS/PERMA CATHETER REMOVAL;  Surgeon: Algernon Huxley, MD;  Location: [place] [facility];  Service: Cardiovascular;  Laterality: N/A;  . ESOPHAGOGASTRODUODENOSCOPY (EGD) WITH PROPOFOL N/A 05/08/2017   Procedure: ESOPHAGOGASTRODUODENOSCOPY (EGD) WITH PROPOFOL;  Surgeon: Toledo, Benay Pike, MD;  Location: [facility] END[facility];  Service: Gastroenterology;  Laterality: N/A;  . keloid removal     right neck  . LEFT HEART CATH AND CORONARY ANGIOGRAPHY N/A 09/10/2016   Procedure: Left Heart Cath and Coronary Angiography;  Surgeon: Isaias Cowman, MD;  Location: [place] [facility];  Service: Cardiovascular;  Laterality: N/A;    Prior to Admission medications   Medication Sig Start Date End Date Taking? Authorizing Provider  aspirin EC 81 MG  EC tablet Take 1 tablet (81 mg total) by mouth daily. Patient taking differently: Take 81 mg by mouth at bedtime.  07/22/17   Salary, Avel Peace, MD  gabapentin (NEURONTIN) 300 MG capsule Take 300 mg by mouth 3 (three) times daily.    [provider]  guaiFENesin (MUCINEX) 600 MG 12 hr tablet Take 1 tablet (600 mg total) by mouth 2 (two) times daily. Patient not taking: Reported on 11/04/2017 10/12/17   Gladstone Lighter, MD  hydrALAZINE (APRESOLINE) 50 MG tablet Take 50 mg by mouth 2 (two) times daily.     [provider]  lactobacillus (FLORANEX/LACTINEX) PACK Take 1 packet (1 g total) by mouth 3 (three) times daily with meals. Patient taking differently: Take 1 g by mouth daily.  07/22/17   Salary, Avel Peace, MD  LOPERAMIDE A-D 2 MG tablet Take 1 tablet (2 mg total) by mouth 3 (three) times daily as needed for diarrhea or loose stools. 10/12/17   Gladstone Lighter, MD  metoprolol tartrate (LOPRESSOR) 25 MG tablet Take 25 mg by mouth at bedtime.     [provider]  mometasone-formoterol (DULERA) 200-5 MCG/ACT AERO Inhale 2 puffs into the lungs 2 (two) times daily. Patient not taking: Reported on 11/04/2017 10/12/17   Gladstone Lighter, MD  multivitamin (RENA-VIT) TABS tablet Take 1 tablet by mouth  daily. 08/19/17   [provider]  pantoprazole (PROTONIX) 40 MG tablet Take 1 tablet (40 mg total) by mouth 2 (two) times daily before a meal. 05/10/17   Gladstone Lighter, MD  rosuvastatin (CRESTOR) 20 MG tablet Take 20 mg by mouth at bedtime.     [provider]  warfarin (COUMADIN) 5 MG tablet Take 1 tablet (5 mg total) by mouth at bedtime. 10/13/17   Gladstone Lighter, MD     Allergies Lisinopril; Atorvastatin calcium; Ezetimibe; and Lipitor [atorvastatin]  Family History  Problem Relation Age of Onset  . Hypertension Mother   . Hyperlipidemia Mother   . Heart disease Mother   . Heart disease Father   . Hyperlipidemia Father   . Hypertension Father     . Kidney disease Sister   . Diabetes Sister   . Hypertension Sister   . Diabetes Brother   . Hypertension Brother   . HIV/AIDS Brother     Social History Social History   Tobacco Use  . Smoking status: Former Smoker    Packs/day: 0.50    Years: 0.00    Pack years: 0.00    Types: Cigarettes    Last attempt to quit: 06/11/1980    Years since quitting: 37.4  . Smokeless tobacco: Never Used  Substance Use Topics  . Alcohol use: No    Comment: socially  . Drug use: No    Review of Systems  Constitutional: No fever/chills Eyes: No visual changes.  ENT: No sore throat. Cardiovascular: Denies chest pain. Respiratory: Denies shortness of breath. Gastrointestinal: Abdominal distention, no nausea vomiting normal stools Genitourinary: Negative for dysuria. Musculoskeletal: Negative for back pain. Skin: Negative for rash. Neurological: Negative for headache   ____________________________________________   PHYSICAL EXAM:  VITAL SIGNS: ED Triage Vitals  Enc Vitals Group     BP 11/25/17 1130 (!) 99/55     Pulse Rate 11/25/17 1130 84     Resp 11/25/17 1130 17     Temp 11/25/17 1130 98.1 F (36.7 C)     Temp Source 11/25/17 1130 Oral     SpO2 11/25/17 1130 95 %     Weight 11/25/17 1131 90.7 kg (200 lb)     Height 11/25/17 1131 1.829 m (6')     Head Circumference --      Peak Flow --      Pain Score 11/25/17 1131 10     Pain Loc --      Pain Edu? --      Excl. in [place]? --     Constitutional: Alert and oriented. No acute distress. Pleasant and interactive Eyes: Conjunctivae are normal.   Nose: No congestion/rhinnorhea. Mouth/Throat: Mucous membranes are moist.   Neck:  Painless ROM Cardiovascular: Normal rate, regular rhythm. Grossly normal heart sounds.  Good peripheral circulation. Respiratory: Normal respiratory effort.  No retractions. Lungs CTAB. Gastrointestinal: Soft and nontender.  Significant distention, suspicious for ascites. no CVA  tenderness.  Musculoskeletal:   Warm and well perfused Neurologic:  Normal speech and language.  Skin:  Skin is warm, dry and intact. No rash noted. Psychiatric: Mood and affect are normal. Speech and behavior are normal.  ____________________________________________   LABS (all labs ordered are listed, but only abnormal results are displayed)  Labs Reviewed  BASIC METABOLIC PANEL - Abnormal; Notable for the following components:      Result Value   Potassium 3.4 (*)    Chloride 93 (*)    Glucose, Bld 143 (*)  Creatinine, Ser 3.40 (*)    Calcium 7.9 (*)    GFR calc non Af Amer 18 (*)    GFR calc Af Amer 20 (*)    Anion gap 16 (*)    All other components within normal limits  CBC - Abnormal; Notable for the following components:   RBC 3.90 (*)    Hemoglobin 10.4 (*)    HCT 30.3 (*)    MCV 77.7 (*)    RDW 16.6 (*)    All other components within normal limits  HEPATIC FUNCTION PANEL - Abnormal; Notable for the following components:   Total Protein 8.2 (*)    Albumin 2.7 (*)    ALT 16 (*)    Alkaline Phosphatase 285 (*)    Bilirubin, Direct 0.6 (*)    All other components within normal limits  URINALYSIS, COMPLETE (UACMP) WITH MICROSCOPIC   ____________________________________________  EKG  ED ECG REPORT I, Lavonia Drafts, the attending physician, personally viewed and interpreted this ECG.  Date: 11/25/2017  Rhythm: normal sinus rhythm QRS Axis: normal Intervals: normal ST/T Wave abnormalities: Nonspecific changes   ____________________________________________  RADIOLOGY  CT abdomen pelvis demonstrate worsening ascites ____________________________________________   PROCEDURES  Procedure(s) performed: No  Procedures   Critical Care performed:No ____________________________________________   INITIAL IMPRESSION / ASSESSMENT AND PLAN / ED COURSE  Pertinent labs & imaging results that were available during my care of the patient were reviewed by me  and considered in my medical decision making (see chart for details).  Patient presents with episode of blurry vision weakness during dialysis.  Overall lab work is unremarkable.  He is most concerned about his worsening distention of his abdomen.  Will order CT abdomen pelvis to evaluate for ascites as he states this is new  ----------------------------------------- 2:42 PM on 11/25/2017 -----------------------------------------  CT demonstrates worsening ascites   ----------------------------------------- 3:23 PM on 11/25/2017 -----------------------------------------  Discussed CT results with the patient he now states that he has weakness in his right leg which developed at 6 AM this morning prior to going to dialysis.  He states that he forgot to tell me this.  On exam he is unable to lift his leg off the bed against gravity.  No other neuro deficits noted.  Will obtain CT head anticipate admission  I have asked Dr. Alfred Levins to follow-up on CT head     ____________________________________________   FINAL CLINICAL IMPRESSION(S) / ED DIAGNOSES  Final diagnoses:  Cerebrovascular accident (CVA), unspecified mechanism ([place])  Other ascites        Note:  This document was prepared using Dragon voice recognition software and may include unintentional dictation errors.    Lavonia Drafts, MD 11/25/17 1524    Lavonia Drafts, MD 11/25/17 [phone]

## 2017-11-25 NOTE — ED Notes (Signed)
Report called to receiving RN on floor and pt updated on plan of care. VSS.

## 2017-11-25 NOTE — ED Notes (Signed)
Pt arrives to ED for blurred vision while at dialysis today. Pt reports "my blood pressure dropped and I was having trouble watching the TV." Pt reports his vision has improved but is still a little blurry when looking at objects. Pt also c/o lower back pain that started today along with generalized weakness. Pt denies any injury or falls recently. Pt reports last BM was yesterday and was normal. Pt able to ambulate with assistance.

## 2017-11-25 NOTE — ED Notes (Signed)
Pt. state's he is a dialysis pt. And can't urinate.

## 2017-11-25 NOTE — ED Notes (Signed)
Patient transported to CT 

## 2017-11-25 NOTE — ED Notes (Signed)
Gave pt. Specimen cup. He states he does not have to urinate at this time.

## 2017-11-25 NOTE — Progress Notes (Signed)
Reviewed Stroke booklet with dtr, wife and pt.  Discussed management of HTN, DM.  Signs and symptoms of CVA and when to call.  Reviewed reason for frequent vital signs and neuro checks. Family with good understanding of teaching. Dorna Bloom RN

## 2017-11-25 NOTE — ED Notes (Signed)
Dialysis RN at bedside to de-access fistula. Room is ready and pt to be transported to floor.

## 2017-11-25 NOTE — Progress Notes (Signed)
Advanced care plan.  Purpose of the Encounter: CODE STATUS  Parties in Attendance: Patient himself  Patient's Decision Capacity: Intact  Subjective/Patient's story:  Patient is a 67 year old with end-stage renal disease on dialysis, hypertension, diabetes type 2 presenting with strokelike symptoms  Objective/Medical story Due to patient's multiple medical problems I discussed with him regarding CODE STATUS.  Explained him what involves and CPR I, pulmonary resuscitation.   Goals of care determination: Patient wishes to be a full code    CODE STATUS: Full code  Time spent discussing advanced care planning: 16 minutes

## 2017-11-25 NOTE — ED Triage Notes (Signed)
Pt in from dialysis center with c/o back pain. EMS reports pt received approx 90% of his treatment. Pt still with access to left arm.

## 2017-11-25 NOTE — ED Notes (Signed)
Contacted dialysis RN for them to de-access fistula, pt updated with plan of care. Will continue to monitor.

## 2017-11-26 ENCOUNTER — Inpatient Hospital Stay
Admit: 2017-11-26 | Discharge: 2017-11-26 | Disposition: A | Discharge status: Home / Self Care | Payer: Medicare Other | Attending: Internal Medicine | Admitting: Internal Medicine

## 2017-11-26 ENCOUNTER — Inpatient Hospital Stay: Payer: Medicare Other

## 2017-11-26 DIAGNOSIS — I959 Hypotension, unspecified: Secondary | ICD-10-CM | POA: Diagnosis not present

## 2017-11-26 DIAGNOSIS — H538 Other visual disturbances: Secondary | ICD-10-CM | POA: Diagnosis not present

## 2017-11-26 LAB — LIPID PANEL
Cholesterol: 82 mg/dL (ref 0–200)
HDL: 34 mg/dL — AB (ref >40)
LDL CALC: 31 mg/dL (ref 0–99)
TRIGLYCERIDES: 84 mg/dL (ref <150)
Total CHOL/HDL Ratio: 2.4 RATIO
VLDL: 17 mg/dL (ref 0–40)

## 2017-11-26 LAB — ECHOCARDIOGRAM COMPLETE
Height: 72 in
Weight: 3200 oz

## 2017-11-26 LAB — PROTEIN, PLEURAL OR PERITONEAL FLUID: Total protein, fluid: 4.7 g/dL

## 2017-11-26 LAB — ALBUMIN, PLEURAL OR PERITONEAL FLUID: Albumin, Fluid: 1.8 g/dL

## 2017-11-26 LAB — GLUCOSE, CAPILLARY
Glucose-Capillary: 189 mg/dL — ABNORMAL HIGH (ref 65–99)
Glucose-Capillary: 171 mg/dL — ABNORMAL HIGH (ref 65–99)
Glucose-Capillary: 135 mg/dL — ABNORMAL HIGH (ref 65–99)
Glucose-Capillary: 118 mg/dL — ABNORMAL HIGH (ref 65–99)

## 2017-11-26 LAB — LACTATE DEHYDROGENASE, PLEURAL OR PERITONEAL FLUID: LD, Fluid: 119 U/L — ABNORMAL HIGH (ref 3–23)

## 2017-11-26 LAB — BODY FLUID CELL COUNT WITH DIFFERENTIAL
Eos, Fluid: 0 %
Lymphs, Fluid: 19 %
Monocyte-Macrophage-Serous Fluid: 39 %
NEUTROPHIL FLUID: 42 %
Total Nucleated Cell Count, Fluid: 723 cu mm

## 2017-11-26 LAB — HEMOGLOBIN A1C
Hgb A1c MFr Bld: 6.7 % — ABNORMAL HIGH (ref 4.8–5.6)
Mean Plasma Glucose: 145.59 mg/dL

## 2017-11-26 LAB — PROTIME-INR
INR: 1.72
Prothrombin Time: 20 seconds — ABNORMAL HIGH (ref 11.4–15.2)

## 2017-11-26 LAB — PATHOLOGIST SMEAR REVIEW

## 2017-11-26 MED ORDER — ALBUMIN HUMAN 25 % IV SOLN
25.0000 g | Freq: Once | INTRAVENOUS | Status: AC
  Administered 2017-11-26: 14:00:00 25 g via INTRAVENOUS
  Filled 2017-11-26: qty 100

## 2017-11-26 NOTE — Progress Notes (Signed)
[facility]  ROUNDING NOTE   Subjective:  Patient well-known to [place] from prior admissions. He presented after hemodialysis yesterday with lightheadedness and dizziness. He also developed blurry vision. In addition patient had significant abdominal distention and is status post paracentesis this a.m.  Objective:  Vital signs in last 24 hours:  Temp:  [97.6 F (36.4 C)-98.5 F (36.9 C)] 97.6 F (36.4 C) (06/18 0814) Pulse Rate:  [80-92] 84 (06/18 1056) Resp:  [15-20] 16 (06/18 1056) BP: (90-130)/(61-82) 130/77 (06/18 1056) SpO2:  [92 %-99 %] 95 % (06/18 1056)  Weight change:  Filed Weights   11/25/17 1131  Weight: 90.7 kg (200 lb)    Intake/Output: No intake/output data recorded.   Intake/Output this shift:  Total I/O In: 360 [P.O.:360] Out: -   Physical Exam: General: No acute distress  Head: Normocephalic, atraumatic. Moist oral mucosal membranes  Eyes: Anicteric  Neck: Supple, trachea midline  Lungs:  Clear to auscultation, normal effort  Heart: S1S2 no rubs  Abdomen:  Soft, mild distension  Extremities: 2+ peripheral edema.  Neurologic: Awake, alert, following commands  Skin: No lesions  Access: LUE AVF    Basic Metabolic Panel: Recent Labs  Lab 11/25/17 1144  NA 136  K 3.4*  CL 93*  CO2 27  GLUCOSE 143*  BUN 14  CREATININE 3.40*  CALCIUM 7.9*    Liver Function Tests: Recent Labs  Lab 11/25/17 1144  AST 29  ALT 16*  ALKPHOS 285*  BILITOT 1.2  PROT 8.2*  ALBUMIN 2.7*   No results for input(s): LIPASE, AMYLASE in the last 168 hours. No results for input(s): AMMONIA in the last 168 hours.  CBC: Recent Labs  Lab 11/25/17 1144  WBC 8.8  HGB 10.4*  HCT 30.3*  MCV 77.7*  PLT 251    Cardiac Enzymes: No results for input(s): CKTOTAL, CKMB, CKMBINDEX, TROPONINI in the last 168 hours.  BNP: Invalid input(s): POCBNP  CBG: Recent Labs  Lab 11/25/17 2136 11/26/17 0742 11/26/17 1222  GLUCAP 173* 189* 171*     Microbiology: Results for orders placed or performed during the hospital encounter of 10/11/17  MRSA PCR Screening     Status: None   Collection Time: 10/11/17  5:08 AM  Result Value Ref Range Status   MRSA by PCR NEGATIVE NEGATIVE Final    Comment:        The GeneXpert MRSA Assay (FDA approved for NASAL specimens only), is one component of a comprehensive MRSA colonization surveillance program. It is not intended to diagnose MRSA infection nor to guide or monitor treatment for MRSA infections. Performed at [facility], [address] [place], [place] [address]   C difficile quick scan w PCR reflex     Status: None   Collection Time: 10/11/17 10:51 AM  Result Value Ref Range Status   C Diff antigen NEGATIVE NEGATIVE Final   C Diff toxin NEGATIVE NEGATIVE Final   C Diff interpretation No C. difficile detected.  Final    Comment: Performed at [facility], [address] [place], [place] [address]    Coagulation Studies: Recent Labs    11/25/17 1952 11/26/17 0351  LABPROT 18.8* 20.0*  INR 1.59 1.72    Urinalysis: No results for input(s): COLORURINE, LABSPEC, PHURINE, GLUCOSEU, HGBUR, BILIRUBINUR, KETONESUR, PROTEINUR, UROBILINOGEN, NITRITE, LEUKOCYTESUR in the last 72 hours.  Invalid input(s): APPERANCEUR    Imaging: Ct Head Wo Contrast  Result Date: 11/25/2017 CLINICAL DATA:  Blurry vision and weakness EXAM: CT HEAD  WITHOUT CONTRAST TECHNIQUE: Contiguous axial images were obtained from the base of the skull through the vertex without intravenous contrast. COMPARISON:  04/09/2016 FINDINGS: Brain: No evidence of acute infarction, hemorrhage, hydrocephalus, extra-axial collection or mass lesion/mass effect. Vascular: No hyperdense vessel or unexpected calcification. Diffuse calcification of subcutaneous vessels is noted. This is consistent with the patient's given clinical history of end-stage renal disease. Skull: Normal. Negative for fracture or  focal lesion. Sinuses/Orbits: No acute finding. Other: None. IMPRESSION: No acute intracranial abnormality noted. Electronically Signed   By: Inez Catalina M.D.   On: 11/25/2017 15:35   Mr Brain Wo Contrast  Result Date: 11/26/2017 CLINICAL DATA:  Blurry vision, intermittent lower extremity weakness. History of end-stage renal disease on dialysis, stroke, hypertension, hyperlipidemia. EXAM: MRI HEAD WITHOUT CONTRAST MRA HEAD WITHOUT CONTRAST TECHNIQUE: Multiplanar, multiecho pulse sequences of the brain and surrounding structures were obtained without intravenous contrast. Angiographic images of the head were obtained using MRA technique without contrast. COMPARISON:  CT HEAD November 25, 2017 and MRI/MRA head April 10, 2016. FINDINGS: MRI HEAD FINDINGS INTRACRANIAL CONTENTS: No reduced diffusion to suggest acute ischemia. No susceptibility artifact to suggest hemorrhage. Mild parenchymal brain volume loss, increased from prior MRI. No hydrocephalus. Faint supratentorial and LEFT pontine white matter FLAIR T2 hyperintensities. No suspicious parenchymal signal, masses, mass effect. No abnormal extra-axial fluid collections. No extra-axial masses. VASCULAR: Normal major intracranial vascular flow voids present at skull base. SKULL AND UPPER CERVICAL SPINE: No abnormal sellar expansion. No suspicious calvarial bone marrow signal. Craniocervical junction maintained. SINUSES/ORBITS: RIGHT mastoid effusion. LEFT maxillary mucosal retention cyst. Trace paranasal sinus mucosal thickening. Included ocular globes and orbital contents are non-suspicious. OTHER: None. MRA HEAD FINDINGS-mild motion degraded examination. ANTERIOR CIRCULATION: Flow related enhancement of the included cervical, petrous, cavernous and supraclinoid internal carotid arteries. Dolichoectatic petrous, cavernous ICA. Moderate RIGHT and mild LEFT stenosis anterior genu. Patent anterior communicating artery. Patent anterior and middle cerebral arteries.  Mild luminal irregularity anterior middle cerebral arteries. No large vessel occlusion, flow limiting stenosis, aneurysm. POSTERIOR CIRCULATION: RIGHT vertebral artery is dominant. Basilar artery is patent, with normal flow related enhancement of the main branch vessels. Patent posterior cerebral arteries, mild luminal irregularity. Small LEFT posterior communicating artery present. No large vessel occlusion, flow limiting stenosis,  aneurysm. ANATOMIC VARIANTS: None. Source images and MIP images were reviewed. IMPRESSION: MRI HEAD: 1. No acute intracranial process. 2. Mild parenchymal brain volume loss, progressed from 2017. 3. Stable mild chronic small vessel ischemic changes. MRA HEAD: 1. Mildly motion degraded examination. No emergent large vessel occlusion or flow-limiting stenosis. 2. Moderate stenosis RIGHT cavernous ICA, mild on the LEFT. 3. mild intracranial atherosclerosis versus motion artifact. Electronically Signed   By: Elon Alas M.D.   On: 11/26/2017 00:46   Mr Lumbar Spine Wo Contrast  Result Date: 11/26/2017 CLINICAL DATA:  Back pain, weakness. History of end-stage renal disease on dialysis. EXAM: MRI LUMBAR SPINE WITHOUT CONTRAST TECHNIQUE: Multiplanar, multisequence MR imaging of the lumbar spine was performed. No intravenous contrast was administered. COMPARISON:  CT abdomen pelvis November 25, 2017 FINDINGS: Poor signal to noise ratio, potentially related to abdominal distension. Nondiagnostic STIR sequence. SEGMENTATION: For the purposes of this report, the last well-formed intervertebral disc is reported as L5-S1. ALIGNMENT: Maintained lumbar lordosis. No malalignment. VERTEBRAE:Vertebral bodies are intact. Intervertebral discs demonstrate normal morphology and signal characteristics. Pain probable subacute discogenic endplate changes L3. Limited assessment for bone marrow edema. CONUS MEDULLARIS AND CAUDA EQUINA: Nondiagnostic assessment of conus medullaris and cauda equina.  PARASPINAL AND OTHER SOFT TISSUES: Limited assessment, non suspicious. DISC LEVELS: L1-2, L2-3: No disc bulge, canal stenosis nor neural foraminal narrowing. L3-4: No disc bulge, canal stenosis nor neural foraminal narrowing. Mild facet arthropathy. L4-5: Small broad-based disc bulge, moderate facet arthropathy and ligamentum flavum redundancy. No canal stenosis. Mild-to-moderate RIGHT, moderate LEFT neural foraminal narrowing. L5-S1: Annular bulging, small LEFT subarticular extraforaminal disc protrusion and annular fissure. Mild facet arthropathy. No canal stenosis. Mild LEFT neural foraminal narrowing. IMPRESSION: 1. Habitus and technically limited examination. Limited assessment of bone marrow edema, nondiagnostic assessment of spinal cord and cauda equina. 2. No fracture or malalignment. 3. No canal stenosis. Neural foraminal narrowing L4-5 and L5-S1: Moderate on the LEFT at L4-5. Electronically Signed   By: Elon Alas M.D.   On: 11/26/2017 00:37   Ct Abdomen Pelvis W Contrast  Result Date: 11/25/2017 CLINICAL DATA:  67 year old male experience blurred vision during dialysis. Initial encounter. EXAM: CT ABDOMEN AND PELVIS WITH CONTRAST TECHNIQUE: Multidetector CT imaging of the abdomen and pelvis was performed using the standard protocol following bolus administration of intravenous contrast. CONTRAST:  7mL ISOVUE-300 IOPAMIDOL (ISOVUE-300) INJECTION 61%, 156mL OMNIPAQUE IOHEXOL 300 MG/ML SOLN COMPARISON:  11/04/2017 and 07/13/2009 CT CT. FINDINGS: Lower chest: Mild basilar atelectasis with small left-sided pleural effusion. Cardiomegaly.  Prominent 3 vessel coronary artery calcification. Hepatobiliary: No worrisome hepatic lesion. Possible 4 mm cyst posterior right lobe. 6 mm calcified gallstone. Pancreas: No worrisome pancreatic mass. Spleen: Slightly elongated spleen spanning over 13.6 cm. No focal splenic mass. Adrenals/Urinary Tract: Poorly functioning atrophic kidneys. Low-density structures  suggestive of renal cysts, some too small to characterize and 1 appearing calcific (lower pole right kidney). Mild adrenal gland hyperplasia without adrenal mass. Contracted noncontrast filled urinary bladder with limited evaluation. Stomach/Bowel: Evaluation of bowel limited by prominent ascites and areas of under distension. Vascular/Lymphatic: Prominent atherosclerotic changes aorta and aortic branch vessels. No abdominal aortic aneurysm or large vessel occlusion. Once again noted are increase number of normal to slightly prominent size upper abdominal and retroperitoneal lymph nodes. Reproductive: No acute abnormality. Other: No free air or bowel containing hernia. Third spacing of fluid. Musculoskeletal: No osseous destructive lesion. Mild sclerosis femoral heads without definitive findings of avascular necrosis. Partial fusion sacroiliac joints. Degenerative changes lumbar spine most notable involving L3-4 and L4-5 facet joints. IMPRESSION: Prominent ascites limits evaluation of bowel. Third spacing of fluid. Aortic Atherosclerosis (ICD10-I70.0). Prominent atherosclerotic changes aortic branch vessels and coronary arteries. Top-normal to slightly enlarged upper abdominal and retroperitoneal lymph nodes once again noted, possibly reactive in origin. Slightly elongated spleen spanning over 13.6 cm. 6 mm calcified gallstone. Poorly functioning kidneys. Low-density structures suggestive of renal cysts although some too small to characterize. One right renal cyst is partially calcified. Cardiomegaly. Electronically Signed   By: Genia Del M.D.   On: 11/25/2017 14:36   US Paracentesis  Result Date: 11/26/2017 INDICATION: Patient with history of end-stage renal disease on dialysis, now with ascites. Request is made for diagnostic and therapeutic paracentesis. EXAM: ULTRASOUND GUIDED DIAGNOSTIC AND THERAPEUTIC PARACENTESIS MEDICATIONS: 10 mL 1% lidocaine COMPLICATIONS: None immediate. PROCEDURE: Informed  written consent was obtained from the patient after a discussion of the risks, benefits and alternatives to treatment. A timeout was performed prior to the initiation of the procedure. Initial ultrasound scanning demonstrates a moderate amount of ascites within the right lower abdominal quadrant. The right lower abdomen was prepped and draped in the usual sterile fashion. 1% lidocaine was used for local anesthesia. Following this, a 6 Fr  Safe-T-Centesis catheter was introduced. An ultrasound image was saved for documentation purposes. The paracentesis was performed. The catheter was removed and a dressing was applied. The patient tolerated the procedure well without immediate post procedural complication. FINDINGS: A total of approximately 4.0 liters of clear, yellow fluid was removed. Samples were sent to the laboratory as requested by the clinical team. IMPRESSION: Successful ultrasound-guided diagnostic and therapeutic paracentesis yielding 4.0 liters of peritoneal fluid. Read by: Brynda Greathouse PA-C Electronically Signed   By: Markus Daft M.D.   On: 11/26/2017 11:45   Mr Jodene Nam Head/brain AL Cm  Result Date: 11/26/2017 CLINICAL DATA:  Blurry vision, intermittent lower extremity weakness. History of end-stage renal disease on dialysis, stroke, hypertension, hyperlipidemia. EXAM: MRI HEAD WITHOUT CONTRAST MRA HEAD WITHOUT CONTRAST TECHNIQUE: Multiplanar, multiecho pulse sequences of the brain and surrounding structures were obtained without intravenous contrast. Angiographic images of the head were obtained using MRA technique without contrast. COMPARISON:  CT HEAD November 25, 2017 and MRI/MRA head April 10, 2016. FINDINGS: MRI HEAD FINDINGS INTRACRANIAL CONTENTS: No reduced diffusion to suggest acute ischemia. No susceptibility artifact to suggest hemorrhage. Mild parenchymal brain volume loss, increased from prior MRI. No hydrocephalus. Faint supratentorial and LEFT pontine white matter FLAIR T2 hyperintensities.  No suspicious parenchymal signal, masses, mass effect. No abnormal extra-axial fluid collections. No extra-axial masses. VASCULAR: Normal major intracranial vascular flow voids present at skull base. SKULL AND UPPER CERVICAL SPINE: No abnormal sellar expansion. No suspicious calvarial bone marrow signal. Craniocervical junction maintained. SINUSES/ORBITS: RIGHT mastoid effusion. LEFT maxillary mucosal retention cyst. Trace paranasal sinus mucosal thickening. Included ocular globes and orbital contents are non-suspicious. OTHER: None. MRA HEAD FINDINGS-mild motion degraded examination. ANTERIOR CIRCULATION: Flow related enhancement of the included cervical, petrous, cavernous and supraclinoid internal carotid arteries. Dolichoectatic petrous, cavernous ICA. Moderate RIGHT and mild LEFT stenosis anterior genu. Patent anterior communicating artery. Patent anterior and middle cerebral arteries. Mild luminal irregularity anterior middle cerebral arteries. No large vessel occlusion, flow limiting stenosis, aneurysm. POSTERIOR CIRCULATION: RIGHT vertebral artery is dominant. Basilar artery is patent, with normal flow related enhancement of the main branch vessels. Patent posterior cerebral arteries, mild luminal irregularity. Small LEFT posterior communicating artery present. No large vessel occlusion, flow limiting stenosis,  aneurysm. ANATOMIC VARIANTS: None. Source images and MIP images were reviewed. IMPRESSION: MRI HEAD: 1. No acute intracranial process. 2. Mild parenchymal brain volume loss, progressed from 2017. 3. Stable mild chronic small vessel ischemic changes. MRA HEAD: 1. Mildly motion degraded examination. No emergent large vessel occlusion or flow-limiting stenosis. 2. Moderate stenosis RIGHT cavernous ICA, mild on the LEFT. 3. mild intracranial atherosclerosis versus motion artifact. Electronically Signed   By: Elon Alas M.D.   On: 11/26/2017 00:46     Medications:    . aspirin EC  81 mg Oral  Daily  . gabapentin  300 mg Oral TID  . guaiFENesin  600 mg Oral BID  . hydrALAZINE  50 mg Oral BID  . insulin aspart  0-9 Units Subcutaneous TID WC  . lactobacillus  1 g Oral TID WC  . metoprolol tartrate  25 mg Oral QHS  . multivitamin  1 tablet Oral Daily  . pantoprazole  40 mg Oral BID AC  . rosuvastatin  20 mg Oral QHS  . warfarin  5 mg Oral q1800  . Warfarin - Pharmacist Dosing Inpatient   Does not apply q1800   acetaminophen **OR** acetaminophen (TYLENOL) oral liquid 160 mg/5 mL **OR** acetaminophen  Assessment/ Plan:  67 y.o. male  end-stage renal disease, hemodialysis, hypertension, CABG, TIA, CVA, diabetes insulin-dependent, peripheral vascular disease, admitted post dialysis with abdominal distension.  MWF/[facility]/LUE AVF  1.  ESRD on HD Monday Wednesday Friday 2.  Ascites. 3.  Anemia of chronic kidney disease. 4.  Secondary hyperparathyroidism 5.  Hypertension.  Plan: Patient underwent hemodialysis yesterday.  He did become quite dizzy and developed blurry vision after dialysis.  Ultrafiltration performed yesterday was 3 kg.  No urgent indication for dialysis today.  He underwent paracentesis today and 4 L of ascites was removed.  We will plan for hemodialysis again tomorrow if the patient is still here.  Work-up of ascites as per hospitalist.     LOS: 1 Macklen Wilhoite 6/18/201912:39 PM

## 2017-11-26 NOTE — Progress Notes (Signed)
OT Cancellation Note  Patient Details Name: Terry Tyler MRN: [id number] DOB: March 24, 1951   Cancelled Treatment:    Reason Eval/Treat Not Completed: Patient at procedure or test/ unavailable. On 2nd attempt, pt still out of room. Will re-attempt at later date/time as pt is available and medically appropriate for OT evaluation.   Jeni Salles, MPH, MS, OTR/L ascom [phone] 11/26/17, 12:08 PM

## 2017-11-26 NOTE — Progress Notes (Signed)
[facility] [place] NAME: Terry Tyler    MR#:  [id number]  DATE OF BIRTH:  1950-11-29  SUBJECTIVE:   Patient admitted to the hospital secondary to blurry vision and also right leg weakness.  Right leg weakness has been chronic but has gotten worse.  Neurologic work-up so far is negative with a negative CT head, MRI of the brain.  Patient's MRI of the lumbar spine also negative for acute spinal stenosis.  Still having some Right Lower Ext. Weakness. Vision has improved.    REVIEW OF SYSTEMS:    Review of Systems  Constitutional: Negative for chills and fever.  HENT: Negative for congestion and tinnitus.   Eyes: Negative for blurred vision and double vision.  Respiratory: Negative for cough, shortness of breath and wheezing.   Cardiovascular: Negative for chest pain, orthopnea and PND.  Gastrointestinal: Negative for abdominal pain, diarrhea, nausea and vomiting.  Genitourinary: Negative for dysuria and hematuria.  Neurological: Positive for focal weakness (Right leg weakness). Negative for dizziness and sensory change.  All other systems reviewed and are negative.   Nutrition: renal/carb modified Tolerating Diet: Yes Tolerating PT: Await Eval.      DRUG ALLERGIES:   Allergies  Allergen Reactions  . Lisinopril Swelling  . Atorvastatin Calcium Other (See Comments)    Muscle cramps   . Ezetimibe Other (See Comments)    Cannot remember what the allergy was.  . Lipitor [Atorvastatin] Other (See Comments)    Muscle cramping    VITALS:  Blood pressure 130/77, pulse 84, temperature 97.6 F (36.4 C), temperature source Oral, resp. rate 16, height 6' (1.829 m), weight 90.7 kg (200 lb), SpO2 95 %.  PHYSICAL EXAMINATION:   Physical Exam  GENERAL:  67 y.o.-year-old patient sitting up in bed in no acute distress.  EYES: Pupils equal, round, reactive to light and accommodation. No scleral icterus. Extraocular muscles intact.  HEENT:  Head atraumatic, normocephalic. Oropharynx and nasopharynx clear.  NECK:  Supple, no jugular venous distention. No thyroid enlargement, no tenderness.  LUNGS: Normal breath sounds bilaterally, no wheezing, rales, rhonchi. No use of accessory muscles of respiration.  CARDIOVASCULAR: S1, S2 normal. No murmurs, rubs, or gallops.  ABDOMEN: Soft, nontender, distended. Bowel sounds present. No organomegaly or mass.  EXTREMITIES: No cyanosis, clubbing or edema b/l.    NEUROLOGIC: Cranial nerves II through XII are intact.  Right lower extremity weakness 3 out of 5 strength. PSYCHIATRIC: The patient is alert and oriented x 3.  SKIN: No obvious rash, lesion, or ulcer.   Right upper extremity AV fistula with good bruit and thrill.  LABORATORY PANEL:   CBC Recent Labs  Lab 11/25/17 1144  WBC 8.8  HGB 10.4*  HCT 30.3*  PLT 251   ------------------------------------------------------------------------------------------------------------------  Chemistries  Recent Labs  Lab 11/25/17 1144  NA 136  K 3.4*  CL 93*  CO2 27  GLUCOSE 143*  BUN 14  CREATININE 3.40*  CALCIUM 7.9*  AST 29  ALT 16*  ALKPHOS 285*  BILITOT 1.2   ------------------------------------------------------------------------------------------------------------------  Cardiac Enzymes No results for input(s): TROPONINI in the last 168 hours. ------------------------------------------------------------------------------------------------------------------  RADIOLOGY:  Ct Head Wo Contrast  Result Date: 11/25/2017 CLINICAL DATA:  Blurry vision and weakness EXAM: CT HEAD WITHOUT CONTRAST TECHNIQUE: Contiguous axial images were obtained from the base of the skull through the vertex without intravenous contrast. COMPARISON:  04/09/2016 FINDINGS: Brain: No evidence of acute infarction, hemorrhage, hydrocephalus, extra-axial collection or mass lesion/mass effect. Vascular: No  hyperdense vessel or unexpected calcification.  Diffuse calcification of subcutaneous vessels is noted. This is consistent with the patient's given clinical history of end-stage renal disease. Skull: Normal. Negative for fracture or focal lesion. Sinuses/Orbits: No acute finding. Other: None. IMPRESSION: No acute intracranial abnormality noted. Electronically Signed   By: Inez Catalina M.D.   On: 11/25/2017 15:35   Mr Brain Wo Contrast  Result Date: 11/26/2017 CLINICAL DATA:  Blurry vision, intermittent lower extremity weakness. History of end-stage renal disease on dialysis, stroke, hypertension, hyperlipidemia. EXAM: MRI HEAD WITHOUT CONTRAST MRA HEAD WITHOUT CONTRAST TECHNIQUE: Multiplanar, multiecho pulse sequences of the brain and surrounding structures were obtained without intravenous contrast. Angiographic images of the head were obtained using MRA technique without contrast. COMPARISON:  CT HEAD November 25, 2017 and MRI/MRA head April 10, 2016. FINDINGS: MRI HEAD FINDINGS INTRACRANIAL CONTENTS: No reduced diffusion to suggest acute ischemia. No susceptibility artifact to suggest hemorrhage. Mild parenchymal brain volume loss, increased from prior MRI. No hydrocephalus. Faint supratentorial and LEFT pontine white matter FLAIR T2 hyperintensities. No suspicious parenchymal signal, masses, mass effect. No abnormal extra-axial fluid collections. No extra-axial masses. VASCULAR: Normal major intracranial vascular flow voids present at skull base. SKULL AND UPPER CERVICAL SPINE: No abnormal sellar expansion. No suspicious calvarial bone marrow signal. Craniocervical junction maintained. SINUSES/ORBITS: RIGHT mastoid effusion. LEFT maxillary mucosal retention cyst. Trace paranasal sinus mucosal thickening. Included ocular globes and orbital contents are non-suspicious. OTHER: None. MRA HEAD FINDINGS-mild motion degraded examination. ANTERIOR CIRCULATION: Flow related enhancement of the included cervical, petrous, cavernous and supraclinoid internal carotid  arteries. Dolichoectatic petrous, cavernous ICA. Moderate RIGHT and mild LEFT stenosis anterior genu. Patent anterior communicating artery. Patent anterior and middle cerebral arteries. Mild luminal irregularity anterior middle cerebral arteries. No large vessel occlusion, flow limiting stenosis, aneurysm. POSTERIOR CIRCULATION: RIGHT vertebral artery is dominant. Basilar artery is patent, with normal flow related enhancement of the main branch vessels. Patent posterior cerebral arteries, mild luminal irregularity. Small LEFT posterior communicating artery present. No large vessel occlusion, flow limiting stenosis,  aneurysm. ANATOMIC VARIANTS: None. Source images and MIP images were reviewed. IMPRESSION: MRI HEAD: 1. No acute intracranial process. 2. Mild parenchymal brain volume loss, progressed from 2017. 3. Stable mild chronic small vessel ischemic changes. MRA HEAD: 1. Mildly motion degraded examination. No emergent large vessel occlusion or flow-limiting stenosis. 2. Moderate stenosis RIGHT cavernous ICA, mild on the LEFT. 3. mild intracranial atherosclerosis versus motion artifact. Electronically Signed   By: Elon Alas M.D.   On: 11/26/2017 00:46   Mr Lumbar Spine Wo Contrast  Result Date: 11/26/2017 CLINICAL DATA:  Back pain, weakness. History of end-stage renal disease on dialysis. EXAM: MRI LUMBAR SPINE WITHOUT CONTRAST TECHNIQUE: Multiplanar, multisequence MR imaging of the lumbar spine was performed. No intravenous contrast was administered. COMPARISON:  CT abdomen pelvis November 25, 2017 FINDINGS: Poor signal to noise ratio, potentially related to abdominal distension. Nondiagnostic STIR sequence. SEGMENTATION: For the purposes of this report, the last well-formed intervertebral disc is reported as L5-S1. ALIGNMENT: Maintained lumbar lordosis. No malalignment. VERTEBRAE:Vertebral bodies are intact. Intervertebral discs demonstrate normal morphology and signal characteristics. Pain probable  subacute discogenic endplate changes L3. Limited assessment for bone marrow edema. CONUS MEDULLARIS AND CAUDA EQUINA: Nondiagnostic assessment of conus medullaris and cauda equina. PARASPINAL AND OTHER SOFT TISSUES: Limited assessment, non suspicious. DISC LEVELS: L1-2, L2-3: No disc bulge, canal stenosis nor neural foraminal narrowing. L3-4: No disc bulge, canal stenosis nor neural foraminal narrowing. Mild facet arthropathy. L4-5: Small broad-based disc bulge,  moderate facet arthropathy and ligamentum flavum redundancy. No canal stenosis. Mild-to-moderate RIGHT, moderate LEFT neural foraminal narrowing. L5-S1: Annular bulging, small LEFT subarticular extraforaminal disc protrusion and annular fissure. Mild facet arthropathy. No canal stenosis. Mild LEFT neural foraminal narrowing. IMPRESSION: 1. Habitus and technically limited examination. Limited assessment of bone marrow edema, nondiagnostic assessment of spinal cord and cauda equina. 2. No fracture or malalignment. 3. No canal stenosis. Neural foraminal narrowing L4-5 and L5-S1: Moderate on the LEFT at L4-5. Electronically Signed   By: Elon Alas M.D.   On: 11/26/2017 00:37   Ct Abdomen Pelvis W Contrast  Result Date: 11/25/2017 CLINICAL DATA:  67 year old male experience blurred vision during dialysis. Initial encounter. EXAM: CT ABDOMEN AND PELVIS WITH CONTRAST TECHNIQUE: Multidetector CT imaging of the abdomen and pelvis was performed using the standard protocol following bolus administration of intravenous contrast. CONTRAST:  61mL ISOVUE-300 IOPAMIDOL (ISOVUE-300) INJECTION 61%, 143mL OMNIPAQUE IOHEXOL 300 MG/ML SOLN COMPARISON:  11/04/2017 and 07/13/2009 CT CT. FINDINGS: Lower chest: Mild basilar atelectasis with small left-sided pleural effusion. Cardiomegaly.  Prominent 3 vessel coronary artery calcification. Hepatobiliary: No worrisome hepatic lesion. Possible 4 mm cyst posterior right lobe. 6 mm calcified gallstone. Pancreas: No worrisome  pancreatic mass. Spleen: Slightly elongated spleen spanning over 13.6 cm. No focal splenic mass. Adrenals/Urinary Tract: Poorly functioning atrophic kidneys. Low-density structures suggestive of renal cysts, some too small to characterize and 1 appearing calcific (lower pole right kidney). Mild adrenal gland hyperplasia without adrenal mass. Contracted noncontrast filled urinary bladder with limited evaluation. Stomach/Bowel: Evaluation of bowel limited by prominent ascites and areas of under distension. Vascular/Lymphatic: Prominent atherosclerotic changes aorta and aortic branch vessels. No abdominal aortic aneurysm or large vessel occlusion. Once again noted are increase number of normal to slightly prominent size upper abdominal and retroperitoneal lymph nodes. Reproductive: No acute abnormality. Other: No free air or bowel containing hernia. Third spacing of fluid. Musculoskeletal: No osseous destructive lesion. Mild sclerosis femoral heads without definitive findings of avascular necrosis. Partial fusion sacroiliac joints. Degenerative changes lumbar spine most notable involving L3-4 and L4-5 facet joints. IMPRESSION: Prominent ascites limits evaluation of bowel. Third spacing of fluid. Aortic Atherosclerosis (ICD10-I70.0). Prominent atherosclerotic changes aortic branch vessels and coronary arteries. Top-normal to slightly enlarged upper abdominal and retroperitoneal lymph nodes once again noted, possibly reactive in origin. Slightly elongated spleen spanning over 13.6 cm. 6 mm calcified gallstone. Poorly functioning kidneys. Low-density structures suggestive of renal cysts although some too small to characterize. One right renal cyst is partially calcified. Cardiomegaly. Electronically Signed   By: Genia Del M.D.   On: 11/25/2017 14:36   US Carotid Bilateral (at [facility] Only)  Result Date: 11/26/2017 CLINICAL DATA:  Hypertension, stroke syncope EXAM: BILATERAL CAROTID DUPLEX ULTRASOUND TECHNIQUE:  Pearline Cables scale imaging, color Doppler and duplex ultrasound were performed of bilateral carotid and vertebral arteries in the neck. COMPARISON:  None. FINDINGS: Criteria: Quantification of carotid stenosis is based on velocity parameters that correlate the residual internal carotid diameter with NASCET-based stenosis levels, using the diameter of the distal internal carotid lumen as the denominator for stenosis measurement. The following velocity measurements were obtained: RIGHT ICA: 69/12 cm/sec CCA: 818/56 cm/sec SYSTOLIC ICA/CCA RATIO:  0.7 ECA:  45 cm/sec LEFT ICA: 60/14 cm/sec CCA: 314/97 cm/sec SYSTOLIC ICA/CCA RATIO:  0.5 ECA:  57 cm/sec RIGHT CAROTID ARTERY: Moderate heterogeneous calcified right carotid bifurcation atherosclerosis. Despite this, no hemodynamically significant right ICA stenosis, velocity elevation, turbulent flow. Degree of narrowing less than 50% bilaterally by ultrasound criteria.  RIGHT VERTEBRAL ARTERY:  Antegrade LEFT CAROTID ARTERY: Similar scattered left carotid bifurcation atherosclerosis. Despite this, no hemodynamically significant left ICA stenosis, velocity elevation, or turbulent flow. Degree of narrowing also less than 50%. LEFT VERTEBRAL ARTERY:  Antegrade IMPRESSION: Moderate bilateral carotid atherosclerosis. No hemodynamically significant ICA stenosis. Degree of narrowing less than 50% bilaterally by ultrasound criteria. Patent antegrade vertebral flow bilaterally Electronically Signed   By: Jerilynn Mages.  Shick M.D.   On: 11/26/2017 13:22   US Paracentesis  Result Date: 11/26/2017 INDICATION: Patient with history of end-stage renal disease on dialysis, now with ascites. Request is made for diagnostic and therapeutic paracentesis. EXAM: ULTRASOUND GUIDED DIAGNOSTIC AND THERAPEUTIC PARACENTESIS MEDICATIONS: 10 mL 1% lidocaine COMPLICATIONS: None immediate. PROCEDURE: Informed written consent was obtained from the patient after a discussion of the risks, benefits and alternatives to  treatment. A timeout was performed prior to the initiation of the procedure. Initial ultrasound scanning demonstrates a moderate amount of ascites within the right lower abdominal quadrant. The right lower abdomen was prepped and draped in the usual sterile fashion. 1% lidocaine was used for local anesthesia. Following this, a 6 Fr Safe-T-Centesis catheter was introduced. An ultrasound image was saved for documentation purposes. The paracentesis was performed. The catheter was removed and a dressing was applied. The patient tolerated the procedure well without immediate post procedural complication. FINDINGS: A total of approximately 4.0 liters of clear, yellow fluid was removed. Samples were sent to the laboratory as requested by the clinical team. IMPRESSION: Successful ultrasound-guided diagnostic and therapeutic paracentesis yielding 4.0 liters of peritoneal fluid. Read by: Brynda Greathouse PA-C Electronically Signed   By: Markus Daft M.D.   On: 11/26/2017 11:45   Mr Jodene Nam Head/brain WF Cm  Result Date: 11/26/2017 CLINICAL DATA:  Blurry vision, intermittent lower extremity weakness. History of end-stage renal disease on dialysis, stroke, hypertension, hyperlipidemia. EXAM: MRI HEAD WITHOUT CONTRAST MRA HEAD WITHOUT CONTRAST TECHNIQUE: Multiplanar, multiecho pulse sequences of the brain and surrounding structures were obtained without intravenous contrast. Angiographic images of the head were obtained using MRA technique without contrast. COMPARISON:  CT HEAD November 25, 2017 and MRI/MRA head April 10, 2016. FINDINGS: MRI HEAD FINDINGS INTRACRANIAL CONTENTS: No reduced diffusion to suggest acute ischemia. No susceptibility artifact to suggest hemorrhage. Mild parenchymal brain volume loss, increased from prior MRI. No hydrocephalus. Faint supratentorial and LEFT pontine white matter FLAIR T2 hyperintensities. No suspicious parenchymal signal, masses, mass effect. No abnormal extra-axial fluid collections. No  extra-axial masses. VASCULAR: Normal major intracranial vascular flow voids present at skull base. SKULL AND UPPER CERVICAL SPINE: No abnormal sellar expansion. No suspicious calvarial bone marrow signal. Craniocervical junction maintained. SINUSES/ORBITS: RIGHT mastoid effusion. LEFT maxillary mucosal retention cyst. Trace paranasal sinus mucosal thickening. Included ocular globes and orbital contents are non-suspicious. OTHER: None. MRA HEAD FINDINGS-mild motion degraded examination. ANTERIOR CIRCULATION: Flow related enhancement of the included cervical, petrous, cavernous and supraclinoid internal carotid arteries. Dolichoectatic petrous, cavernous ICA. Moderate RIGHT and mild LEFT stenosis anterior genu. Patent anterior communicating artery. Patent anterior and middle cerebral arteries. Mild luminal irregularity anterior middle cerebral arteries. No large vessel occlusion, flow limiting stenosis, aneurysm. POSTERIOR CIRCULATION: RIGHT vertebral artery is dominant. Basilar artery is patent, with normal flow related enhancement of the main branch vessels. Patent posterior cerebral arteries, mild luminal irregularity. Small LEFT posterior communicating artery present. No large vessel occlusion, flow limiting stenosis,  aneurysm. ANATOMIC VARIANTS: None. Source images and MIP images were reviewed. IMPRESSION: MRI HEAD: 1. No acute intracranial process. 2. Mild parenchymal brain  volume loss, progressed from 2017. 3. Stable mild chronic small vessel ischemic changes. MRA HEAD: 1. Mildly motion degraded examination. No emergent large vessel occlusion or flow-limiting stenosis. 2. Moderate stenosis RIGHT cavernous ICA, mild on the LEFT. 3. mild intracranial atherosclerosis versus motion artifact. Electronically Signed   By: Elon Alas M.D.   On: 11/26/2017 00:46     ASSESSMENT AND PLAN:   67 year old male with past medical history of end-stage renal disease on hemodialysis, essential hypertension,  hyperlipidemia, diabetes, history of previous CVA, peripheral vascular disease who presents to the hospital due to blurry vision, and difficulty moving his right lower extremity.  1.  Blurry vision/right lower extremity weakness- initially patient was admitted for suspicion of underlying CVA. -Patient's neurologic work-up although has been negative.  CT head, MRI/MRA of the brain is negative for acute pathology.  Patient carotid duplex shows no hemodynamically significant carotid stenosis. -Seen by neurology and they think this is likely related to some mild hypo-tension during dialysis. -Continue supportive care, await physical therapy evaluation.  Blurry vision has now resolved patient's lower extremity weakness seems to be somewhat chronic but it has gotten somewhat worse.  2.  Abdominal distention-secondary to volume overload and ascites. - Patient is status post therapeutic ultrasound-guided paracentesis with 4 L of fluid removed.  Patient given albumin post paracentesis.  3.  Diabetes type 2 without complication-continue sliding scale insulin.  4.  History of previous CVA-continue aspirin, warfarin.  5.  Hyperlipidemia-continue Crestor.  6.  GERD-continue Protonix.  7.  End-stage renal disease on hemodialysis-patient get dialysis on Monday, Wednesday, Friday.  Plan for possible dialysis tomorrow patient still in the hospital.  [facility] input.  8.  Diabetic neuropathy-continue gabapentin.     All the records are reviewed and case discussed with Care Management/Social Worker. Management plans discussed with the patient, family and they are in agreement.  CODE STATUS: Full code  DVT Prophylaxis: Coumadin  TOTAL TIME TAKING CARE OF THIS PATIENT: 30 minutes.   POSSIBLE D/C IN 1-2 DAYS, DEPENDING ON CLINICAL CONDITION.   Henreitta Leber M.D on 11/26/2017 at 2:48 PM  Between 7am to 6pm - Pager - [phone]  After 6pm go to [email or web address] - [employer]  [facility]  Office  [phone]  CC: Primary care physician; Elisabeth Cara, NP

## 2017-11-26 NOTE — Progress Notes (Signed)
*  PRELIMINARY RESULTS* Echocardiogram 2D Echocardiogram has been performed.  Terry Tyler 11/26/2017, 1:20 PM

## 2017-11-26 NOTE — Progress Notes (Signed)
ANTICOAGULATION CONSULT NOTE - Initial Consult  Pharmacy Consult for  Warfarin  Indication: atrial fibrillation  Allergies  Allergen Reactions  . Lisinopril Swelling  . Atorvastatin Calcium Other (See Comments)    Muscle cramps   . Ezetimibe Other (See Comments)    Cannot remember what the allergy was.  . Lipitor [Atorvastatin] Other (See Comments)    Muscle cramping    Patient Measurements: Height: 6' (182.9 cm) Weight: 200 lb (90.7 kg) IBW/kg (Calculated) : 77.6 Heparin Dosing Weight:   Vital Signs: Temp: 98.5 F (36.9 C) (06/18 0625) Temp Source: Oral (06/18 0625) BP: 115/75 (06/18 0625) Pulse Rate: 91 (06/18 0625)  Labs: Recent Labs    11/25/17 1144 11/25/17 1952 11/26/17 0351  HGB 10.4*  --   --   HCT 30.3*  --   --   PLT 251  --   --   LABPROT  --  18.8* 20.0*  INR  --  1.59 1.72  CREATININE 3.40*  --   --     Estimated Creatinine Clearance: 24.1 mL/min (A) (by C-G formula based on SCr of 3.4 mg/dL (H)).   Medical History: Past Medical History:  Diagnosis Date  . C. difficile diarrhea   . CHF (congestive heart failure) ([place])   . CKD (chronic kidney disease)   . Coronary atherosclerosis of unspecified type of vessel, native or graft    Myocardial infarction in 2006. Cardiac catheterization showed significant three-vessel coronary artery disease. He underwent CABG at [facility]. Most recent cardiac catheterization in 2010 showed normal LV systolic function, patent grafts to OM1, RCA and LAD. Occluded SVG to diagonal.  . 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 Dialysis patient [facility])    Mon. Wed. Fri. for dialysis  . Hyperlipidemia   . Hypertension   . Hypertension, renal disease   . Impotence of organic origin   . Keloid scar   . Myocardial infarction ([place]) 2006  . Neoplasm of uncertain behavior, site unspecified   . Peripheral vascular disease, unspecified ([place])   . Proteinuria   . Renal insufficiency   . Stroke [facility]) 04/2016    TIA  . Unspecified vitamin D deficiency     Medications:  Medications Prior to Admission  Medication Sig Dispense Refill Last Dose  . aspirin EC 81 MG EC tablet Take 1 tablet (81 mg total) by mouth daily. (Patient taking differently: Take 81 mg by mouth at bedtime. ) 180 tablet 0 11/03/2017 at 2000  . gabapentin (NEURONTIN) 300 MG capsule Take 300 mg by mouth 3 (three) times daily.   11/03/2017 at 2000  . hydrALAZINE (APRESOLINE) 50 MG tablet Take 50 mg by mouth 2 (two) times daily.    11/03/2017 at 2000  . lactobacillus (FLORANEX/LACTINEX) PACK Take 1 packet (1 g total) by mouth 3 (three) times daily with meals. (Patient taking differently: Take 1 g by mouth daily. ) 30 packet 0 11/03/2017 at 0800  . metoprolol tartrate (LOPRESSOR) 25 MG tablet Take 25 mg by mouth at bedtime.    11/03/2017 at 2000  . multivitamin (RENA-VIT) TABS tablet Take 1 tablet by mouth daily.  4 11/03/2017 at 0800  . pantoprazole (PROTONIX) 40 MG tablet Take 1 tablet (40 mg total) by mouth 2 (two) times daily before a meal. 60 tablet 1 11/04/2017 at 0600  . rosuvastatin (CRESTOR) 20 MG tablet Take 20 mg by mouth at bedtime.    11/03/2017 at 2000  . warfarin (COUMADIN) 5 MG tablet Take  1 tablet (5 mg total) by mouth at bedtime. 30 tablet 0 11/03/2017 at 2000  . guaiFENesin (MUCINEX) 600 MG 12 hr tablet Take 1 tablet (600 mg total) by mouth 2 (two) times daily. (Patient not taking: Reported on 11/04/2017) 30 tablet 0 Not Taking at Unknown time  . LOPERAMIDE A-D 2 MG tablet Take 1 tablet (2 mg total) by mouth 3 (three) times daily as needed for diarrhea or loose stools. (Patient not taking: Reported on 11/25/2017) 30 tablet 0 Not Taking at Unknown time  . mometasone-formoterol (DULERA) 200-5 MCG/ACT AERO Inhale 2 puffs into the lungs 2 (two) times daily. (Patient not taking: Reported on 11/04/2017) 1 Inhaler 0 Not Taking at Unknown time    Assessment: Pharmacy consulted to dose warfarin in this 67 year old male with Afib. Pt was on  warfarin 5 mg PO daily at home. 6/17 :  INR = 1.59 ,  No warfarin given on 6/17 at time of INR draw  Goal of Therapy:  INR 2-3   Plan:  Warfarin 7.5 mg PO X 1 ordered to be given on 6/17 . Will recheck INR on 6/18 with AM labs. Warfarin 5 mg PO Q24H ordered to resume on 6/18 .   06/18 @ 0400 INR 1.72 subtherapeutic, but increased from 1.59 after warfarin 7.5 mg PO x 1. Will continue w/ warfarin 5 mg this am and monitor INR w/ am labs.  Tobie Lords, PharmD, BCPS Clinical Pharmacist 11/26/2017

## 2017-11-26 NOTE — Progress Notes (Signed)
Pt to MRI

## 2017-11-26 NOTE — Progress Notes (Signed)
PT Cancellation Note  Patient Details Name: KAMAAL CAST MRN: [id number] DOB: [birth year]   Cancelled Treatment:    Reason Eval/Treat Not Completed: Patient at procedure or test/unavailable(Evaluation re-attempted.  Patient now off unit for paracentesis.  Will re-attempt at later time/date as patient medically appropriate and available.)   Dona Walby H. Owens Shark, PT, DPT, NCS 11/26/17, 10:01 AM [phone]

## 2017-11-26 NOTE — Plan of Care (Signed)
  Problem: Education: Goal: Knowledge of disease or condition will improve Outcome: Progressing Goal: Knowledge of secondary prevention will improve Outcome: Progressing Goal: Knowledge of patient specific risk factors addressed and post discharge goals established will improve Outcome: Progressing   Problem: Coping: Goal: Will verbalize positive feelings about self Outcome: Progressing Goal: Will identify appropriate support needs Outcome: Progressing   Problem: Health Behavior/Discharge Planning: Goal: Ability to manage health-related needs will improve Outcome: Progressing   Problem: Self-Care: Goal: Ability to participate in self-care as condition permits will improve Outcome: Progressing   Problem: Nutrition: Goal: Risk of aspiration will decrease Outcome: Progressing   Problem: Ischemic Stroke/TIA Tissue Perfusion: Goal: Complications of ischemic stroke/TIA will be minimized Outcome: Progressing   Problem: Education: Goal: Knowledge of General Education information will improve Outcome: Progressing   Problem: Health Behavior/Discharge Planning: Goal: Ability to manage health-related needs will improve Outcome: Progressing   Problem: Clinical Measurements: Goal: Ability to maintain clinical measurements within normal limits will improve Outcome: Progressing Goal: Will remain free from infection Outcome: Progressing Goal: Diagnostic test results will improve Outcome: Progressing Goal: Cardiovascular complication will be avoided Outcome: Progressing   Problem: Activity: Goal: Risk for activity intolerance will decrease Outcome: Progressing   Problem: Nutrition: Goal: Adequate nutrition will be maintained Outcome: Progressing   Problem: Coping: Goal: Level of anxiety will decrease Outcome: Progressing   Problem: Elimination: Goal: Will not experience complications related to bowel motility Outcome: Progressing Goal: Will not experience complications  related to urinary retention Outcome: Progressing   Problem: Pain Managment: Goal: General experience of comfort will improve Outcome: Progressing   Problem: Safety: Goal: Ability to remain free from injury will improve Outcome: Progressing   Problem: Skin Integrity: Goal: Risk for impaired skin integrity will decrease Outcome: Progressing   Problem: Self-Care: Goal: Ability to communicate needs accurately will improve Outcome: Completed/Met   Problem: Nutrition: Goal: Dietary intake will improve Outcome: Completed/Met   Problem: Clinical Measurements: Goal: Respiratory complications will improve Outcome: Completed/Met

## 2017-11-26 NOTE — Progress Notes (Signed)
PT Cancellation Note  Patient Details Name: KENDON SEDENO MRN: [id number] DOB: [birth year]   Cancelled Treatment:    Reason Eval/Treat Not Completed: Other (comment)(Consult received and chart reviewed.  Patient currently eating breakfast.  Will re-attempt at later time/date as medically appropriate and available.)   Jennae Hakeem H. Owens Shark, PT, DPT, NCS 11/26/17, 9:03 AM [phone]

## 2017-11-26 NOTE — Consult Note (Addendum)
Reason for Consult: Blurred vision Referring Physician: Verdell Carmine  CC: Blurred vision  HPI: Terry Tyler is an 67 y.o. male with a history of end-stage renal disease on dialysis who reports that on yesterday he felt some weakness in his right lower extremity.  He does admit to chronic right lower extremity weakness since his stroke.  Patient also reports that while on dialysis and noted blurred vision.  This was in both eyes.  He did not lose vision completely.  Patient was brought to the emergency room at that time.  Patient with reports that he has had issues with hypotension recently.  Blood pressure was quite low on presentation.  Past Medical History:  Diagnosis Date  . C. difficile diarrhea   . CHF (congestive heart failure) ([place])   . CKD (chronic kidney disease)   . Coronary atherosclerosis of unspecified type of vessel, native or graft    Myocardial infarction in 2006. Cardiac catheterization showed significant three-vessel coronary artery disease. He underwent CABG at [facility]. Most recent cardiac catheterization in 2010 showed normal LV systolic function, patent grafts to OM1, RCA and LAD. Occluded SVG to diagonal.  . 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 Dialysis patient [facility])    Mon. Wed. Fri. for dialysis  . Hyperlipidemia   . Hypertension   . Hypertension, renal disease   . Impotence of organic origin   . Keloid scar   . Myocardial infarction ([place]) 2006  . Neoplasm of uncertain behavior, site unspecified   . Peripheral vascular disease, unspecified ([place])   . Proteinuria   . Renal insufficiency   . Stroke [facility]) 04/2016   TIA  . Unspecified vitamin D deficiency     Past Surgical History:  Procedure Laterality Date  . A/V FISTULAGRAM Left 01/03/2017   Procedure: A/V Fistulagram;  Surgeon: Algernon Huxley, MD;  Location: [place] CV LAB;  Service: Cardiovascular;  Laterality: Left;  . A/V FISTULAGRAM Left 06/06/2017   Procedure: A/V FISTULAGRAM;   Surgeon: Algernon Huxley, MD;  Location: [place] [facility];  Service: Cardiovascular;  Laterality: Left;  . A/V FISTULAGRAM Left 09/02/2017   Procedure: A/V FISTULAGRAM;  Surgeon: Algernon Huxley, MD;  Location: [place] [facility];  Service: Cardiovascular;  Laterality: Left;  . A/V SHUNT INTERVENTION N/A 01/03/2017   Procedure: A/V Shunt Intervention;  Surgeon: Algernon Huxley, MD;  Location: [place] [facility] LAB;  Service: Cardiovascular;  Laterality: N/A;  . A/V SHUNT INTERVENTION N/A 06/06/2017   Procedure: A/V SHUNT INTERVENTION;  Surgeon: Algernon Huxley, MD;  Location: [place] [facility];  Service: Cardiovascular;  Laterality: N/A;  . AV FISTULA PLACEMENT Left 10/25/2016   Procedure: ARTERIOVENOUS (AV) FISTULA CREATION ( RADIOCEPHALIC );  Surgeon: Algernon Huxley, MD;  Location: [facility] ORS;  Service: Vascular;  Laterality: Left;  . CARDIAC CATHETERIZATION  12/2004   [facility];[place]  . CARDIAC CATHETERIZATION  09/2008   [facility]  . COLONOSCOPY WITH PROPOFOL N/A 05/09/2017   Procedure: COLONOSCOPY WITH PROPOFOL;  Surgeon: Toledo, Benay Pike, MD;  Location: [facility] ENDOSCOPY;  Service: Gastroenterology;  Laterality: N/A;  . CORONARY ANGIOPLASTY    . CORONARY ARTERY BYPASS GRAFT  2006   [facility]  . CORONARY STENT INTERVENTION N/A 09/10/2016   Procedure: Coronary Stent Intervention;  Surgeon: Isaias Cowman, MD;  Location: [place] [facility];  Service: Cardiovascular;  Laterality: N/A;  . DIALYSIS/PERMA CATHETER REMOVAL N/A 02/14/2017   Procedure: DIALYSIS/PERMA CATHETER REMOVAL;  Surgeon: Algernon Huxley,  MD;  Location: [place] CV LAB;  Service: Cardiovascular;  Laterality: N/A;  . ESOPHAGOGASTRODUODENOSCOPY (EGD) WITH PROPOFOL N/A 05/08/2017   Procedure: ESOPHAGOGASTRODUODENOSCOPY (EGD) WITH PROPOFOL;  Surgeon: Toledo, Benay Pike, MD;  Location: [facility] ENDOSCOPY;  Service: Gastroenterology;  Laterality: N/A;  . keloid removal     right neck  . LEFT HEART CATH AND CORONARY ANGIOGRAPHY N/A 09/10/2016    Procedure: Left Heart Cath and Coronary Angiography;  Surgeon: Isaias Cowman, MD;  Location: [place] [facility];  Service: Cardiovascular;  Laterality: N/A;    Family History  Problem Relation Age of Onset  . Hypertension Mother   . Hyperlipidemia Mother   . Heart disease Mother   . Heart disease Father   . Hyperlipidemia Father   . Hypertension Father   . Kidney disease Sister   . Diabetes Sister   . Hypertension Sister   . Diabetes Brother   . Hypertension Brother   . HIV/AIDS Brother     Social History:  reports that he quit smoking about 37 years ago. His smoking use included cigarettes. He smoked 0.50 packs per day for 0.00 years. He has never used smokeless tobacco. He reports that he does not drink alcohol or use drugs.  Allergies  Allergen Reactions  . Lisinopril Swelling  . Atorvastatin Calcium Other (See Comments)    Muscle cramps   . Ezetimibe Other (See Comments)    Cannot remember what the allergy was.  . Lipitor [Atorvastatin] Other (See Comments)    Muscle cramping    Medications:  I have reviewed the patient's current medications. Prior to Admission:  Medications Prior to Admission  Medication Sig Dispense Refill Last Dose  . aspirin EC 81 MG EC tablet Take 1 tablet (81 mg total) by mouth daily. (Patient taking differently: Take 81 mg by mouth at bedtime. ) 180 tablet 0 11/03/2017 at 2000  . gabapentin (NEURONTIN) 300 MG capsule Take 300 mg by mouth 3 (three) times daily.   11/03/2017 at 2000  . hydrALAZINE (APRESOLINE) 50 MG tablet Take 50 mg by mouth 2 (two) times daily.    11/03/2017 at 2000  . lactobacillus (FLORANEX/LACTINEX) PACK Take 1 packet (1 g total) by mouth 3 (three) times daily with meals. (Patient taking differently: Take 1 g by mouth daily. ) 30 packet 0 11/03/2017 at 0800  . metoprolol tartrate (LOPRESSOR) 25 MG tablet Take 25 mg by mouth at bedtime.    11/03/2017 at 2000  . multivitamin (RENA-VIT) TABS tablet Take 1 tablet by mouth  daily.  4 11/03/2017 at 0800  . pantoprazole (PROTONIX) 40 MG tablet Take 1 tablet (40 mg total) by mouth 2 (two) times daily before a meal. 60 tablet 1 11/04/2017 at 0600  . rosuvastatin (CRESTOR) 20 MG tablet Take 20 mg by mouth at bedtime.    11/03/2017 at 2000  . warfarin (COUMADIN) 5 MG tablet Take 1 tablet (5 mg total) by mouth at bedtime. 30 tablet 0 11/03/2017 at 2000  . guaiFENesin (MUCINEX) 600 MG 12 hr tablet Take 1 tablet (600 mg total) by mouth 2 (two) times daily. (Patient not taking: Reported on 11/04/2017) 30 tablet 0 Not Taking at Unknown time  . LOPERAMIDE A-D 2 MG tablet Take 1 tablet (2 mg total) by mouth 3 (three) times daily as needed for diarrhea or loose stools. (Patient not taking: Reported on 11/25/2017) 30 tablet 0 Not Taking at Unknown time  . mometasone-formoterol (DULERA) 200-5 MCG/ACT AERO Inhale 2 puffs into the lungs 2 (two) times  daily. (Patient not taking: Reported on 11/04/2017) 1 Inhaler 0 Not Taking at Unknown time   Scheduled: . aspirin EC  81 mg Oral Daily  . gabapentin  300 mg Oral TID  . guaiFENesin  600 mg Oral BID  . hydrALAZINE  50 mg Oral BID  . insulin aspart  0-9 Units Subcutaneous TID WC  . lactobacillus  1 g Oral TID WC  . metoprolol tartrate  25 mg Oral QHS  . multivitamin  1 tablet Oral Daily  . pantoprazole  40 mg Oral BID AC  . rosuvastatin  20 mg Oral QHS  . warfarin  5 mg Oral q1800  . Warfarin - Pharmacist Dosing Inpatient   Does not apply q1800    ROS: History obtained from the patient  General ROS: negative for - chills, fatigue, fever, night sweats, weight gain or weight loss Psychological ROS: negative for - behavioral disorder, hallucinations, memory difficulties, mood swings or suicidal ideation Ophthalmic ROS: negative for - blurry vision, double vision, eye pain or loss of vision ENT ROS: negative for - epistaxis, nasal discharge, oral lesions, sore throat, tinnitus or vertigo Allergy and Immunology ROS: negative for - hives or  itchy/watery eyes Hematological and Lymphatic ROS: negative for - bleeding problems, bruising or swollen lymph nodes Endocrine ROS: negative for - galactorrhea, hair pattern changes, polydipsia/polyuria or temperature intolerance Respiratory ROS: negative for - cough, hemoptysis, shortness of breath or wheezing Cardiovascular ROS: Lower extremity edema Gastrointestinal ROS: Abdominal swelling Genito-Urinary ROS: negative for - dysuria, hematuria, incontinence or urinary frequency/urgency Musculoskeletal ROS: negative for - joint swelling or muscular weakness Neurological ROS: as noted in HPI Dermatological ROS: Wound on left foot  Physical Examination: Blood pressure 130/77, pulse 84, temperature 97.6 F (36.4 C), temperature source Oral, resp. rate 16, height 6' (1.829 m), weight 90.7 kg (200 lb), SpO2 95 %.  HEENT-  Normocephalic, no lesions, without obvious abnormality.  Normal external eye and conjunctiva.  Normal TM's bilaterally.  Normal auditory canals and external ears. Normal external nose, mucus membranes and septum.  Normal pharynx. Cardiovascular- S1, S2 normal, pulses palpable throughout   Lungs- chest clear, no wheezing, rales, normal symmetric air entry Abdomen- soft, non-tender; bowel sounds normal; no masses,  no organomegaly Extremities- BLE edema Lymph-no adenopathy palpable Musculoskeletal-no joint tenderness, deformity or swelling Skin-warm and dry, no hyperpigmentation, vitiligo, or suspicious lesions  Neurological Examination   Mental Status: Alert, oriented, thought content appropriate.  Speech fluent without evidence of aphasia.  Able to follow 3 step commands without difficulty. Cranial Nerves: II: Discs flat bilaterally; Visual fields grossly normal, pupils equal, round, reactive to light and accommodation III,IV, VI: ptosis not present, extra-ocular motions intact bilaterally V,VII: smile symmetric, facial light touch sensation normal bilaterally VIII:  hearing normal bilaterally IX,X: gag reflex present XI: bilateral shoulder shrug XII: midline tongue extension Motor: Right : Upper extremity   5/5    Left:     Upper extremity   5/5  Lower extremity   2/5     Lower extremity   3-/5 Tone and bulk:normal tone throughout; no atrophy noted Sensory: Pinprick and light touch intact throughout, bilaterally Deep Tendon Reflexes: 1+ in the upper extremities and absent in the lower extremities Plantars: Right: Mute   left: Mute Cerebellar: Normal finger-to-nose testing bilaterally Gait: normal gait and station    Laboratory Studies:   Basic Metabolic Panel: Recent Labs  Lab 11/25/17 1144  NA 136  K 3.4*  CL 93*  CO2 27  GLUCOSE 143*  BUN 14  CREATININE 3.40*  CALCIUM 7.9*    Liver Function Tests: Recent Labs  Lab 11/25/17 1144  AST 29  ALT 16*  ALKPHOS 285*  BILITOT 1.2  PROT 8.2*  ALBUMIN 2.7*   No results for input(s): LIPASE, AMYLASE in the last 168 hours. No results for input(s): AMMONIA in the last 168 hours.  CBC: Recent Labs  Lab 11/25/17 1144  WBC 8.8  HGB 10.4*  HCT 30.3*  MCV 77.7*  PLT 251    Cardiac Enzymes: No results for input(s): CKTOTAL, CKMB, CKMBINDEX, TROPONINI in the last 168 hours.  BNP: Invalid input(s): POCBNP  CBG: Recent Labs  Lab 11/25/17 2136 11/26/17 0742 11/26/17 1222  GLUCAP 173* 189* 171*    Microbiology: Results for orders placed or performed during the hospital encounter of 10/11/17  MRSA PCR Screening     Status: None   Collection Time: 10/11/17  5:08 AM  Result Value Ref Range Status   MRSA by PCR NEGATIVE NEGATIVE Final    Comment:        The GeneXpert MRSA Assay (FDA approved for NASAL specimens only), is one component of a comprehensive MRSA colonization surveillance program. It is not intended to diagnose MRSA infection nor to guide or monitor treatment for MRSA infections. Performed at [facility], [address] [place], [place]  [address]   C difficile quick scan w PCR reflex     Status: None   Collection Time: 10/11/17 10:51 AM  Result Value Ref Range Status   C Diff antigen NEGATIVE NEGATIVE Final   C Diff toxin NEGATIVE NEGATIVE Final   C Diff interpretation No C. difficile detected.  Final    Comment: Performed at [facility], [address] [place], [place] [address]    Coagulation Studies: Recent Labs    11/25/17 1952 11/26/17 0351  LABPROT 18.8* 20.0*  INR 1.59 1.72    Urinalysis: No results for input(s): COLORURINE, LABSPEC, PHURINE, GLUCOSEU, HGBUR, BILIRUBINUR, KETONESUR, PROTEINUR, UROBILINOGEN, NITRITE, LEUKOCYTESUR in the last 168 hours.  Invalid input(s): APPERANCEUR  Lipid Panel:     Component Value Date/Time   CHOL 82 11/26/2017 0351   TRIG 84 11/26/2017 0351   HDL 34 (L) 11/26/2017 0351   CHOLHDL 2.4 11/26/2017 0351   VLDL 17 11/26/2017 0351   LDLCALC 31 11/26/2017 0351    HgbA1C:  Lab Results  Component Value Date   HGBA1C 6.7 (H) 11/26/2017    Urine Drug Screen:      Component Value Date/Time   LABOPIA NONE DETECTED 04/09/2016 2122   COCAINSCRNUR NONE DETECTED 04/09/2016 2122   LABBENZ NONE DETECTED 04/09/2016 2122   AMPHETMU NONE DETECTED 04/09/2016 2122   THCU NONE DETECTED 04/09/2016 2122   LABBARB NONE DETECTED 04/09/2016 2122    Alcohol Level: No results for input(s): ETH in the last 168 hours.  Other results: EKG: 1st degree AV block, sinus rhythm at 85 bpm.  Imaging: Ct Head Wo Contrast  Result Date: 11/25/2017 CLINICAL DATA:  Blurry vision and weakness EXAM: CT HEAD WITHOUT CONTRAST TECHNIQUE: Contiguous axial images were obtained from the base of the skull through the vertex without intravenous contrast. COMPARISON:  04/09/2016 FINDINGS: Brain: No evidence of acute infarction, hemorrhage, hydrocephalus, extra-axial collection or mass lesion/mass effect. Vascular: No hyperdense vessel or unexpected calcification. Diffuse calcification of  subcutaneous vessels is noted. This is consistent with the patient's given clinical history of end-stage renal disease. Skull: Normal. Negative for fracture or focal lesion. Sinuses/Orbits: No acute finding.  Other: None. IMPRESSION: No acute intracranial abnormality noted. Electronically Signed   By: Inez Catalina M.D.   On: 11/25/2017 15:35   Mr Brain Wo Contrast  Result Date: 11/26/2017 CLINICAL DATA:  Blurry vision, intermittent lower extremity weakness. History of end-stage renal disease on dialysis, stroke, hypertension, hyperlipidemia. EXAM: MRI HEAD WITHOUT CONTRAST MRA HEAD WITHOUT CONTRAST TECHNIQUE: Multiplanar, multiecho pulse sequences of the brain and surrounding structures were obtained without intravenous contrast. Angiographic images of the head were obtained using MRA technique without contrast. COMPARISON:  CT HEAD November 25, 2017 and MRI/MRA head April 10, 2016. FINDINGS: MRI HEAD FINDINGS INTRACRANIAL CONTENTS: No reduced diffusion to suggest acute ischemia. No susceptibility artifact to suggest hemorrhage. Mild parenchymal brain volume loss, increased from prior MRI. No hydrocephalus. Faint supratentorial and LEFT pontine white matter FLAIR T2 hyperintensities. No suspicious parenchymal signal, masses, mass effect. No abnormal extra-axial fluid collections. No extra-axial masses. VASCULAR: Normal major intracranial vascular flow voids present at skull base. SKULL AND UPPER CERVICAL SPINE: No abnormal sellar expansion. No suspicious calvarial bone marrow signal. Craniocervical junction maintained. SINUSES/ORBITS: RIGHT mastoid effusion. LEFT maxillary mucosal retention cyst. Trace paranasal sinus mucosal thickening. Included ocular globes and orbital contents are non-suspicious. OTHER: None. MRA HEAD FINDINGS-mild motion degraded examination. ANTERIOR CIRCULATION: Flow related enhancement of the included cervical, petrous, cavernous and supraclinoid internal carotid arteries. Dolichoectatic  petrous, cavernous ICA. Moderate RIGHT and mild LEFT stenosis anterior genu. Patent anterior communicating artery. Patent anterior and middle cerebral arteries. Mild luminal irregularity anterior middle cerebral arteries. No large vessel occlusion, flow limiting stenosis, aneurysm. POSTERIOR CIRCULATION: RIGHT vertebral artery is dominant. Basilar artery is patent, with normal flow related enhancement of the main branch vessels. Patent posterior cerebral arteries, mild luminal irregularity. Small LEFT posterior communicating artery present. No large vessel occlusion, flow limiting stenosis,  aneurysm. ANATOMIC VARIANTS: None. Source images and MIP images were reviewed. IMPRESSION: MRI HEAD: 1. No acute intracranial process. 2. Mild parenchymal brain volume loss, progressed from 2017. 3. Stable mild chronic small vessel ischemic changes. MRA HEAD: 1. Mildly motion degraded examination. No emergent large vessel occlusion or flow-limiting stenosis. 2. Moderate stenosis RIGHT cavernous ICA, mild on the LEFT. 3. mild intracranial atherosclerosis versus motion artifact. Electronically Signed   By: Elon Alas M.D.   On: 11/26/2017 00:46   Mr Lumbar Spine Wo Contrast  Result Date: 11/26/2017 CLINICAL DATA:  Back pain, weakness. History of end-stage renal disease on dialysis. EXAM: MRI LUMBAR SPINE WITHOUT CONTRAST TECHNIQUE: Multiplanar, multisequence MR imaging of the lumbar spine was performed. No intravenous contrast was administered. COMPARISON:  CT abdomen pelvis November 25, 2017 FINDINGS: Poor signal to noise ratio, potentially related to abdominal distension. Nondiagnostic STIR sequence. SEGMENTATION: For the purposes of this report, the last well-formed intervertebral disc is reported as L5-S1. ALIGNMENT: Maintained lumbar lordosis. No malalignment. VERTEBRAE:Vertebral bodies are intact. Intervertebral discs demonstrate normal morphology and signal characteristics. Pain probable subacute discogenic endplate  changes L3. Limited assessment for bone marrow edema. CONUS MEDULLARIS AND CAUDA EQUINA: Nondiagnostic assessment of conus medullaris and cauda equina. PARASPINAL AND OTHER SOFT TISSUES: Limited assessment, non suspicious. DISC LEVELS: L1-2, L2-3: No disc bulge, canal stenosis nor neural foraminal narrowing. L3-4: No disc bulge, canal stenosis nor neural foraminal narrowing. Mild facet arthropathy. L4-5: Small broad-based disc bulge, moderate facet arthropathy and ligamentum flavum redundancy. No canal stenosis. Mild-to-moderate RIGHT, moderate LEFT neural foraminal narrowing. L5-S1: Annular bulging, small LEFT subarticular extraforaminal disc protrusion and annular fissure. Mild facet arthropathy. No canal stenosis. Mild LEFT neural  foraminal narrowing. IMPRESSION: 1. Habitus and technically limited examination. Limited assessment of bone marrow edema, nondiagnostic assessment of spinal cord and cauda equina. 2. No fracture or malalignment. 3. No canal stenosis. Neural foraminal narrowing L4-5 and L5-S1: Moderate on the LEFT at L4-5. Electronically Signed   By: Elon Alas M.D.   On: 11/26/2017 00:37   Ct Abdomen Pelvis W Contrast  Result Date: 11/25/2017 CLINICAL DATA:  67 year old male experience blurred vision during dialysis. Initial encounter. EXAM: CT ABDOMEN AND PELVIS WITH CONTRAST TECHNIQUE: Multidetector CT imaging of the abdomen and pelvis was performed using the standard protocol following bolus administration of intravenous contrast. CONTRAST:  84mL ISOVUE-300 IOPAMIDOL (ISOVUE-300) INJECTION 61%, 156mL OMNIPAQUE IOHEXOL 300 MG/ML SOLN COMPARISON:  11/04/2017 and 07/13/2009 CT CT. FINDINGS: Lower chest: Mild basilar atelectasis with small left-sided pleural effusion. Cardiomegaly.  Prominent 3 vessel coronary artery calcification. Hepatobiliary: No worrisome hepatic lesion. Possible 4 mm cyst posterior right lobe. 6 mm calcified gallstone. Pancreas: No worrisome pancreatic mass. Spleen:  Slightly elongated spleen spanning over 13.6 cm. No focal splenic mass. Adrenals/Urinary Tract: Poorly functioning atrophic kidneys. Low-density structures suggestive of renal cysts, some too small to characterize and 1 appearing calcific (lower pole right kidney). Mild adrenal gland hyperplasia without adrenal mass. Contracted noncontrast filled urinary bladder with limited evaluation. Stomach/Bowel: Evaluation of bowel limited by prominent ascites and areas of under distension. Vascular/Lymphatic: Prominent atherosclerotic changes aorta and aortic branch vessels. No abdominal aortic aneurysm or large vessel occlusion. Once again noted are increase number of normal to slightly prominent size upper abdominal and retroperitoneal lymph nodes. Reproductive: No acute abnormality. Other: No free air or bowel containing hernia. Third spacing of fluid. Musculoskeletal: No osseous destructive lesion. Mild sclerosis femoral heads without definitive findings of avascular necrosis. Partial fusion sacroiliac joints. Degenerative changes lumbar spine most notable involving L3-4 and L4-5 facet joints. IMPRESSION: Prominent ascites limits evaluation of bowel. Third spacing of fluid. Aortic Atherosclerosis (ICD10-I70.0). Prominent atherosclerotic changes aortic branch vessels and coronary arteries. Top-normal to slightly enlarged upper abdominal and retroperitoneal lymph nodes once again noted, possibly reactive in origin. Slightly elongated spleen spanning over 13.6 cm. 6 mm calcified gallstone. Poorly functioning kidneys. Low-density structures suggestive of renal cysts although some too small to characterize. One right renal cyst is partially calcified. Cardiomegaly. Electronically Signed   By: Genia Del M.D.   On: 11/25/2017 14:36   US Carotid Bilateral (at [facility])  Result Date: 11/26/2017 CLINICAL DATA:  Hypertension, stroke syncope EXAM: BILATERAL CAROTID DUPLEX ULTRASOUND TECHNIQUE: Pearline Cables scale imaging,  color Doppler and duplex ultrasound were performed of bilateral carotid and vertebral arteries in the neck. COMPARISON:  None. FINDINGS: Criteria: Quantification of carotid stenosis is based on velocity parameters that correlate the residual internal carotid diameter with NASCET-based stenosis levels, using the diameter of the distal internal carotid lumen as the denominator for stenosis measurement. The following velocity measurements were obtained: RIGHT ICA: 69/12 cm/sec CCA: 174/08 cm/sec SYSTOLIC ICA/CCA RATIO:  0.7 ECA:  45 cm/sec LEFT ICA: 60/14 cm/sec CCA: 144/81 cm/sec SYSTOLIC ICA/CCA RATIO:  0.5 ECA:  57 cm/sec RIGHT CAROTID ARTERY: Moderate heterogeneous calcified right carotid bifurcation atherosclerosis. Despite this, no hemodynamically significant right ICA stenosis, velocity elevation, turbulent flow. Degree of narrowing less than 50% bilaterally by ultrasound criteria. RIGHT VERTEBRAL ARTERY:  Antegrade LEFT CAROTID ARTERY: Similar scattered left carotid bifurcation atherosclerosis. Despite this, no hemodynamically significant left ICA stenosis, velocity elevation, or turbulent flow. Degree of narrowing also less than 50%. LEFT VERTEBRAL ARTERY:  Antegrade IMPRESSION: Moderate bilateral carotid atherosclerosis. No hemodynamically significant ICA stenosis. Degree of narrowing less than 50% bilaterally by ultrasound criteria. Patent antegrade vertebral flow bilaterally Electronically Signed   By: Jerilynn Mages.  Shick M.D.   On: 11/26/2017 13:22   US Paracentesis  Result Date: 11/26/2017 INDICATION: Patient with history of end-stage renal disease on dialysis, now with ascites. Request is made for diagnostic and therapeutic paracentesis. EXAM: ULTRASOUND GUIDED DIAGNOSTIC AND THERAPEUTIC PARACENTESIS MEDICATIONS: 10 mL 1% lidocaine COMPLICATIONS: None immediate. PROCEDURE: Informed written consent was obtained from the patient after a discussion of the risks, benefits and alternatives to treatment. A timeout  was performed prior to the initiation of the procedure. Initial ultrasound scanning demonstrates a moderate amount of ascites within the right lower abdominal quadrant. The right lower abdomen was prepped and draped in the usual sterile fashion. 1% lidocaine was used for local anesthesia. Following this, a 6 Fr Safe-T-Centesis catheter was introduced. An ultrasound image was saved for documentation purposes. The paracentesis was performed. The catheter was removed and a dressing was applied. The patient tolerated the procedure well without immediate post procedural complication. FINDINGS: A total of approximately 4.0 liters of clear, yellow fluid was removed. Samples were sent to the laboratory as requested by the clinical team. IMPRESSION: Successful ultrasound-guided diagnostic and therapeutic paracentesis yielding 4.0 liters of peritoneal fluid. Read by: Brynda Greathouse PA-C Electronically Signed   By: Markus Daft M.D.   On: 11/26/2017 11:45   Mr Jodene Nam Head/brain FG Cm  Result Date: 11/26/2017 CLINICAL DATA:  Blurry vision, intermittent lower extremity weakness. History of end-stage renal disease on dialysis, stroke, hypertension, hyperlipidemia. EXAM: MRI HEAD WITHOUT CONTRAST MRA HEAD WITHOUT CONTRAST TECHNIQUE: Multiplanar, multiecho pulse sequences of the brain and surrounding structures were obtained without intravenous contrast. Angiographic images of the head were obtained using MRA technique without contrast. COMPARISON:  CT HEAD November 25, 2017 and MRI/MRA head April 10, 2016. FINDINGS: MRI HEAD FINDINGS INTRACRANIAL CONTENTS: No reduced diffusion to suggest acute ischemia. No susceptibility artifact to suggest hemorrhage. Mild parenchymal brain volume loss, increased from prior MRI. No hydrocephalus. Faint supratentorial and LEFT pontine white matter FLAIR T2 hyperintensities. No suspicious parenchymal signal, masses, mass effect. No abnormal extra-axial fluid collections. No extra-axial masses.  VASCULAR: Normal major intracranial vascular flow voids present at skull base. SKULL AND UPPER CERVICAL SPINE: No abnormal sellar expansion. No suspicious calvarial bone marrow signal. Craniocervical junction maintained. SINUSES/ORBITS: RIGHT mastoid effusion. LEFT maxillary mucosal retention cyst. Trace paranasal sinus mucosal thickening. Included ocular globes and orbital contents are non-suspicious. OTHER: None. MRA HEAD FINDINGS-mild motion degraded examination. ANTERIOR CIRCULATION: Flow related enhancement of the included cervical, petrous, cavernous and supraclinoid internal carotid arteries. Dolichoectatic petrous, cavernous ICA. Moderate RIGHT and mild LEFT stenosis anterior genu. Patent anterior communicating artery. Patent anterior and middle cerebral arteries. Mild luminal irregularity anterior middle cerebral arteries. No large vessel occlusion, flow limiting stenosis, aneurysm. POSTERIOR CIRCULATION: RIGHT vertebral artery is dominant. Basilar artery is patent, with normal flow related enhancement of the main branch vessels. Patent posterior cerebral arteries, mild luminal irregularity. Small LEFT posterior communicating artery present. No large vessel occlusion, flow limiting stenosis,  aneurysm. ANATOMIC VARIANTS: None. Source images and MIP images were reviewed. IMPRESSION: MRI HEAD: 1. No acute intracranial process. 2. Mild parenchymal brain volume loss, progressed from 2017. 3. Stable mild chronic small vessel ischemic changes. MRA HEAD: 1. Mildly motion degraded examination. No emergent large vessel occlusion or flow-limiting stenosis. 2. Moderate stenosis RIGHT cavernous ICA, mild on the LEFT.  3. mild intracranial atherosclerosis versus motion artifact. Electronically Signed   By: Elon Alas M.D.   On: 11/26/2017 00:46     Assessment/Plan: 67 year old male presenting after an episode of blurred vision and worsening of his right lower extremity weakness.  Currently the patient has  returned to baseline.  His right leg remains weak but actually also at this point his left leg is weak as well.  He has just returned from an abdominal procedure and is unclear if that may be affecting his ability to provide a full effort on exam.  MRI of the brain reviewed and shows no acute changes.  MRA is unremarkable.  MRI of the lumbar spine shows no abnormalities to explain lower extremity weakness.  Hypotension has been an issue even since admission.  I suspect that his worsening on yesterday was transient and due to hypotension.  Recommendations: 1.  Would liberalize blood pressure while still being vigilant about hypertension as well 2.  Will reevaluate in a.m. as a follow-up to lower extremity weakness 3.  Physical therapy to evaluate 4.  Agree with continued Coumadin and aspirin  Alexis Goodell, MD Neurology [phone] 11/26/2017, 2:31 PM

## 2017-11-26 NOTE — Evaluation (Signed)
SLP Cancellation Note  Patient Details Name: Terry Tyler MRN: [id number] DOB: [birth year]   Cancelled treatment:       Reason Eval/Treat Not Completed: SLP screened, no needs identified, will sign off  SLP educated pt and nsg to notify slp if changes occur. Pt stated he doesn't drink much during meal. SLP provided education to increase fluids and alternate solids and liquids during meal. Pt able to verbally agree.    [place] Sauber 11/26/2017, 11:03 AM

## 2017-11-26 NOTE — Progress Notes (Signed)
OT Cancellation Note  Patient Details Name: Terry Tyler MRN: [id number] DOB: [birth year]   Cancelled Treatment:    Reason Eval/Treat Not Completed: Patient at procedure or test/ unavailable. Order received, chart reviewed. Pt out of room for paracentesis. Will re-attempt OT evaluation at later date/time as pt is available and medically appropriate.  Jeni Salles, MPH, MS, OTR/L ascom [phone] 11/26/17, 10:33 AM

## 2017-11-26 NOTE — Progress Notes (Signed)
Per Dr Verdell Carmine discontinue NIH and neuro checks, pt MRI neg for stroke

## 2017-11-27 DIAGNOSIS — H538 Other visual disturbances: Secondary | ICD-10-CM | POA: Diagnosis not present

## 2017-11-27 DIAGNOSIS — I959 Hypotension, unspecified: Secondary | ICD-10-CM | POA: Diagnosis not present

## 2017-11-27 LAB — GLUCOSE, CAPILLARY
Glucose-Capillary: 112 mg/dL — ABNORMAL HIGH (ref 65–99)
Glucose-Capillary: 155 mg/dL — ABNORMAL HIGH (ref 65–99)

## 2017-11-27 LAB — HEPATITIS PANEL, ACUTE
HEP A IGM: NEGATIVE
HEP B C IGM: NEGATIVE
Hepatitis B Surface Ag: NEGATIVE

## 2017-11-27 LAB — PROTIME-INR
INR: 1.96
Prothrombin Time: 22.2 s — ABNORMAL HIGH (ref 11.4–15.2)

## 2017-11-27 LAB — HIV ANTIBODY (ROUTINE TESTING W REFLEX): HIV Screen 4th Generation wRfx: NONREACTIVE

## 2017-11-27 LAB — PHOSPHORUS: PHOSPHORUS: 3.4 mg/dL (ref 2.5–4.6)

## 2017-11-27 MED ORDER — WARFARIN SODIUM 5 MG PO TABS
5.0000 mg | ORAL_TABLET | Freq: Every day | ORAL | Status: DC
  Administered 2017-11-27: 5 mg via ORAL
  Filled 2017-11-27: qty 1

## 2017-11-27 NOTE — Evaluation (Signed)
Clinical/Bedside Swallow Evaluation Patient Details  Name: Terry Tyler MRN: [id number] Date of Birth: August 01, 1950  Today's Date: 11/27/2017 Time: SLP Start Time (ACUTE ONLY): 1010 SLP Stop Time (ACUTE ONLY): 1047 SLP Time Calculation (min) (ACUTE ONLY): 37 min  Past Medical History:  Past Medical History:  Diagnosis Date  . C. difficile diarrhea   . CHF (congestive heart failure) ([place])   . CKD (chronic kidney disease)   . Coronary atherosclerosis of unspecified type of vessel, native or graft    Myocardial infarction in 2006. Cardiac catheterization showed significant three-vessel coronary artery disease. He underwent [facility] at [facility]. Most recent cardiac catheterization in 2010 showed normal LV systolic function, patent grafts to OM1, RCA and LAD. Occluded SVG to diagonal.  . 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 Dialysis patient [facility])    Mon. Wed. Fri. for dialysis  . Hyperlipidemia   . Hypertension   . Hypertension, renal disease   . Impotence of organic origin   . Keloid scar   . Myocardial infarction ([place]) 2006  . Neoplasm of uncertain behavior, site unspecified   . Peripheral vascular disease, unspecified ([place])   . Proteinuria   . Renal insufficiency   . Stroke [facility]) 04/2016   TIA  . Unspecified vitamin D deficiency    Past Surgical History:  Past Surgical History:  Procedure Laterality Date  . A/V FISTULAGRAM Left 01/03/2017   Procedure: A/V Fistulagram;  Surgeon: Algernon Huxley, MD;  Location: [place] CV LAB;  Service: Cardiovascular;  Laterality: Left;  . A/V FISTULAGRAM Left 06/06/2017   Procedure: A/V FISTULAGRAM;  Surgeon: Algernon Huxley, MD;  Location: [place] [facility];  Service: Cardiovascular;  Laterality: Left;  . A/V FISTULAGRAM Left 09/02/2017   Procedure: A/V FISTULAGRAM;  Surgeon: Algernon Huxley, MD;  Location: [place] [facility];  Service: Cardiovascular;  Laterality: Left;  . A/V SHUNT INTERVENTION N/A 01/03/2017   Procedure:  A/V Shunt Intervention;  Surgeon: Algernon Huxley, MD;  Location: [place] CV LAB;  Service: Cardiovascular;  Laterality: N/A;  . A/V SHUNT INTERVENTION N/A 06/06/2017   Procedure: A/V SHUNT INTERVENTION;  Surgeon: Algernon Huxley, MD;  Location: [place] [facility];  Service: Cardiovascular;  Laterality: N/A;  . AV FISTULA PLACEMENT Left 10/25/2016   Procedure: ARTERIOVENOUS (AV) FISTULA CREATION ( RADIOCEPHALIC );  Surgeon: Algernon Huxley, MD;  Location: [facility] OR[facility];  Service: Vascular;  Laterality: Left;  . CARDIAC CATHETERIZATION  12/2004   [facility]  . CARDIAC CATHETERIZATION  09/2008   [facility]  . COLONOSCOPY WITH PROPOFOL N/A 05/09/2017   Procedure: COLONOSCOPY WITH PROPOFOL;  Surgeon: Toledo, Benay Pike, MD;  Location: [facility] ENDOSCOPY;  Service: Gastroenterology;  Laterality: N/A;  . CORONARY ANGIOPLASTY    . CORONARY ARTERY BYPASS GRAFT  2006   [facility]  . CORONARY STENT INTERVENTION N/A 09/10/2016   Procedure: Coronary Stent Intervention;  Surgeon: Isaias Cowman, MD;  Location: [place] [facility];  Service: Cardiovascular;  Laterality: N/A;  . DIALYSIS/PERMA CATHETER REMOVAL N/A 02/14/2017   Procedure: DIALYSIS/PERMA CATHETER REMOVAL;  Surgeon: Algernon Huxley, MD;  Location: [place] CV LAB;  Service: Cardiovascular;  Laterality: N/A;  . ESOPHAGOGASTRODUODENOSCOPY (EGD) WITH PROPOFOL N/A 05/08/2017   Procedure: ESOPHAGOGASTRODUODENOSCOPY (EGD) WITH PROPOFOL;  Surgeon: Toledo, Benay Pike, MD;  Location: [facility] END[facility];  Service: Gastroenterology;  Laterality: N/A;  . keloid removal     right neck  . LEFT HEART CATH AND CORONARY ANGIOGRAPHY N/A 09/10/2016  Procedure: Left Heart Cath and Coronary Angiography;  Surgeon: Isaias Cowman, MD;  Location: [place] [facility];  Service: Cardiovascular;  Laterality: N/A;   HPI:      Assessment / Plan / Recommendation Clinical Impression  pt presents with a moderate pharyngeal esophageal dysphagia as characterized by belching  uncontrollably post intake of solids and liquids. pt had no overt ssx aspiration with solids or liquidss during intake, however post intake pt had multiple instances of belching with cough associated with the belching. pt states that this is a common occurance for him and it would eventually resolve. pt appeared to be coughing on  what was being  regurgitated with the belch. MD present for portion of session and it was discussed that an upper GI barium swallow may be helpful in determining affects of uppper GI on swallow ability. pt and daughter states he coughs post meals every meal. pt does not appear to cough during meal, however liquid restrictions prevented further evaluation at this time. order placed for upper GI. NSG and MD agree with slp plan for further esophageal study and to continue with current diet until after results from that procedure are complete.  SLP Visit Diagnosis: Dysphagia, pharyngoesophageal phase (R13.14)    Aspiration Risk  Mild aspiration risk;Moderate aspiration risk    Diet Recommendation Regular;Thin liquid   Liquid Administration via: No straw Medication Administration: Whole meds with puree Supervision: Patient able to self feed Compensations: Slow rate;Small sips/bites;Follow solids with liquid Postural Changes: Seated upright at 90 degrees    Other  Recommendations Recommended Consults: Consider GI evaluation Oral Care Recommendations: Oral care BID;Patient independent with oral care   Follow up Recommendations        Frequency and Duration min 3x week  2 weeks       Prognosis Prognosis for Safe Diet Advancement: Fair Barriers to Reach Goals: Severity of deficits      Swallow Study   General Date of Onset: 11/27/17 Type of Study: Bedside Swallow Evaluation Diet Prior to this Study: Regular;Thin liquids Temperature Spikes Noted: No Respiratory Status: Room air History of Recent Intubation: No Behavior/Cognition: Alert;Cooperative;Pleasant  mood Oral Cavity Assessment: Within Functional Limits Oral Care Completed by SLP: No Oral Cavity - Dentition: Adequate natural dentition Vision: Functional for self-feeding Self-Feeding Abilities: Able to feed self Patient Positioning: Upright in bed Baseline Vocal Quality: Normal Volitional Cough: Strong Volitional Swallow: Able to elicit    Oral/Motor/Sensory Function Overall Oral Motor/Sensory Function: Within functional limits   Ice Chips Ice chips: Within functional limits Presentation: Self Fed;Spoon   Thin Liquid Thin Liquid: Within functional limits Presentation: Cup;Self Fed    Nectar Thick Nectar Thick Liquid: Not tested   Honey Thick Honey Thick Liquid: Not tested   Puree Puree: Within functional limits Presentation: Spoon;Self Fed   Solid   GO   Solid: Within functional limits Presentation: Self Fed;Spoon        [place] Sauber 11/27/2017,12:08 PM

## 2017-11-27 NOTE — Progress Notes (Signed)
ANTICOAGULATION CONSULT NOTE - Initial Consult  Pharmacy Consult for  Warfarin  Indication: atrial fibrillation  Allergies  Allergen Reactions  . Lisinopril Swelling  . Atorvastatin Calcium Other (See Comments)    Muscle cramps   . Ezetimibe Other (See Comments)    Cannot remember what the allergy was.  . Lipitor [Atorvastatin] Other (See Comments)    Muscle cramping    Patient Measurements: Height: 6' (182.9 cm) Weight: 200 lb (90.7 kg) IBW/kg (Calculated) : 77.6 Heparin Dosing Weight:   Vital Signs: Temp: 98.7 F (37.1 C) (06/19 0515) Temp Source: Oral (06/19 0515) BP: 106/68 (06/19 0515) Pulse Rate: 91 (06/19 0515)  Labs: Recent Labs    11/25/17 1144 11/25/17 1952 11/26/17 0351 11/27/17 0552  HGB 10.4*  --   --   --   HCT 30.3*  --   --   --   PLT 251  --   --   --   LABPROT  --  18.8* 20.0* 22.2*  INR  --  1.59 1.72 1.96  CREATININE 3.40*  --   --   --     Estimated Creatinine Clearance: 24.1 mL/min (A) (by C-G formula based on SCr of 3.4 mg/dL (H)).   Medical History: Past Medical History:  Diagnosis Date  . C. difficile diarrhea   . CHF (congestive heart failure) ([place])   . CKD (chronic kidney disease)   . Coronary atherosclerosis of unspecified type of vessel, native or graft    Myocardial infarction in 2006. Cardiac catheterization showed significant three-vessel coronary artery disease. He underwent [facility]G at [facility]. Most recent cardiac catheterization in 2010 showed normal LV systolic function, patent grafts to OM1, RCA and LAD. Occluded SVG to diagonal.  . 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 Dialysis patient [facility])    Mon. Wed. Fri. for dialysis  . Hyperlipidemia   . Hypertension   . Hypertension, renal disease   . Impotence of organic origin   . Keloid scar   . Myocardial infarction ([place]) 2006  . Neoplasm of uncertain behavior, site unspecified   . Peripheral vascular disease, unspecified ([place])   . Proteinuria   .  Renal insufficiency   . Stroke [facility]) 04/2016   TIA  . Unspecified vitamin D deficiency     Medications:  Medications Prior to Admission  Medication Sig Dispense Refill Last Dose  . aspirin EC 81 MG EC tablet Take 1 tablet (81 mg total) by mouth daily. (Patient taking differently: Take 81 mg by mouth at bedtime. ) 180 tablet 0 11/03/2017 at 2000  . gabapentin (NEURONTIN) 300 MG capsule Take 300 mg by mouth 3 (three) times daily.   11/03/2017 at 2000  . hydrALAZINE (APRESOLINE) 50 MG tablet Take 50 mg by mouth 2 (two) times daily.    11/03/2017 at 2000  . lactobacillus (FLORANEX/LACTINEX) PACK Take 1 packet (1 g total) by mouth 3 (three) times daily with meals. (Patient taking differently: Take 1 g by mouth daily. ) 30 packet 0 11/03/2017 at 0800  . metoprolol tartrate (LOPRESSOR) 25 MG tablet Take 25 mg by mouth at bedtime.    11/03/2017 at 2000  . multivitamin (RENA-VIT) TABS tablet Take 1 tablet by mouth daily.  4 11/03/2017 at 0800  . pantoprazole (PROTONIX) 40 MG tablet Take 1 tablet (40 mg total) by mouth 2 (two) times daily before a meal. 60 tablet 1 11/04/2017 at 0600  . rosuvastatin (CRESTOR) 20 MG tablet Take 20 mg by mouth  at bedtime.    11/03/2017 at 2000  . warfarin (COUMADIN) 5 MG tablet Take 1 tablet (5 mg total) by mouth at bedtime. 30 tablet 0 11/03/2017 at 2000  . guaiFENesin (MUCINEX) 600 MG 12 hr tablet Take 1 tablet (600 mg total) by mouth 2 (two) times daily. (Patient not taking: Reported on 11/04/2017) 30 tablet 0 Not Taking at Unknown time  . LOPERAMIDE A-D 2 MG tablet Take 1 tablet (2 mg total) by mouth 3 (three) times daily as needed for diarrhea or loose stools. (Patient not taking: Reported on 11/25/2017) 30 tablet 0 Not Taking at Unknown time  . mometasone-formoterol (DULERA) 200-5 MCG/ACT AERO Inhale 2 puffs into the lungs 2 (two) times daily. (Patient not taking: Reported on 11/04/2017) 1 Inhaler 0 Not Taking at Unknown time    Assessment: Pharmacy consulted to dose warfarin  in this 67 year old male with Afib. Pt was on warfarin 5 mg PO daily at home. 6/17 :  INR = 1.59 ,  No warfarin given on 6/17 at time of INR draw  Goal of Therapy:  INR 2-3   Plan:  Warfarin 7.5 mg PO X 1 ordered to be given on 6/17 . Will recheck INR on 6/18 with AM labs. Warfarin 5 mg PO Q24H ordered to resume on 6/18 .   06/18 @ 0400 INR 1.72 subtherapeutic, but increased from 1.59 after warfarin 7.5 mg PO x 1. Will continue w/ warfarin 5 mg this am and monitor INR w/ am labs.  11/27/2017 05:52 INR 1.96. Continue 5 mg po daily and monitor INR daily.   Terry Tyler A. Jordan Hawks, PharmD, BCPS Clinical Pharmacist 11/27/2017

## 2017-11-27 NOTE — Progress Notes (Signed)
[facility]  ROUNDING NOTE   Subjective:  Patient due for hemodialysis today. Resting comfortably at the moment.  Objective:  Vital signs in last 24 hours:  Temp:  [98.7 F (37.1 C)] 98.7 F (37.1 C) (06/19 0515) Pulse Rate:  [89-91] 91 (06/19 0515) Resp:  [18] 18 (06/19 0515) BP: (106-116)/(68-71) 106/68 (06/19 0515) SpO2:  [95 %-100 %] 100 % (06/19 0515)  Weight change:  Filed Weights   11/25/17 1131  Weight: 90.7 kg (200 lb)    Intake/Output: I/O last 3 completed shifts: In: 360 [P.O.:360] Out: -    Intake/Output this shift:  Total I/O In: 240 [P.O.:240] Out: -   Physical Exam: General: No acute distress  Head: Normocephalic, atraumatic. Moist oral mucosal membranes  Eyes: Anicteric  Neck: Supple, trachea midline  Lungs:  Clear to auscultation, normal effort  Heart: S1S2 no rubs  Abdomen:  Soft, mild distension  Extremities: 2+ peripheral edema.  Neurologic: Awake, alert, following commands  Skin: No lesions  Access: LUE AVF    Basic Metabolic Panel: Recent Labs  Lab 11/25/17 1144  NA 136  K 3.4*  CL 93*  CO2 27  GLUCOSE 143*  BUN 14  CREATININE 3.40*  CALCIUM 7.9*    Liver Function Tests: Recent Labs  Lab 11/25/17 1144  AST 29  ALT 16*  ALKPHOS 285*  BILITOT 1.2  PROT 8.2*  ALBUMIN 2.7*   No results for input(s): LIPASE, AMYLASE in the last 168 hours. No results for input(s): AMMONIA in the last 168 hours.  CBC: Recent Labs  Lab 11/25/17 1144  WBC 8.8  HGB 10.4*  HCT 30.3*  MCV 77.7*  PLT 251    Cardiac Enzymes: No results for input(s): CKTOTAL, CKMB, CKMBINDEX, TROPONINI in the last 168 hours.  BNP: Invalid input(s): POCBNP  CBG: Recent Labs  Lab 11/26/17 1222 11/26/17 1637 11/26/17 2106 11/27/17 0734 11/27/17 1156  GLUCAP 171* 135* 118* 112* 155*    Microbiology: Results for orders placed or performed during the hospital encounter of 11/25/17  Body fluid culture     Status: None (Preliminary  result)   Collection Time: 11/26/17 10:30 AM  Result Value Ref Range Status   Specimen Description   Final    PERITONEAL Performed at [facility], [address] [place], [place] [address]    Special Requests   Final    PERITONEAL Performed at [facility], [address] [place], [place] [address]    Gram Stain   Final    MODERATE WBC PRESENT,BOTH PMN AND MONONUCLEAR NO ORGANISMS SEEN    Culture   Final    NO GROWTH < 24 HOURS Performed at [facility], [place] [address], [place],  [address]    Report Status PENDING  Incomplete    Coagulation Studies: Recent Labs    11/25/17 1952 11/26/17 0351 11/27/17 0552  LABPROT 18.8* 20.0* 22.2*  INR 1.59 1.72 1.96    Urinalysis: No results for input(s): COLORURINE, LABSPEC, PHURINE, GLUCOSEU, HGBUR, BILIRUBINUR, KETONESUR, PROTEINUR, UROBILINOGEN, NITRITE, LEUKOCYTESUR in the last 72 hours.  Invalid input(s): APPERANCEUR    Imaging: Ct Head Wo Contrast  Result Date: 11/25/2017 CLINICAL DATA:  Blurry vision and weakness EXAM: CT HEAD WITHOUT CONTRAST TECHNIQUE: Contiguous axial images were obtained from the base of the skull through the vertex without intravenous contrast. COMPARISON:  04/09/2016 FINDINGS: Brain: No evidence of acute infarction, hemorrhage, hydrocephalus, extra-axial collection or mass lesion/mass effect. Vascular: No hyperdense vessel or unexpected calcification. Diffuse  calcification of subcutaneous vessels is noted. This is consistent with the patient's given clinical history of end-stage renal disease. Skull: Normal. Negative for fracture or focal lesion. Sinuses/Orbits: No acute finding. Other: None. IMPRESSION: No acute intracranial abnormality noted. Electronically Signed   By: Inez Catalina M.D.   On: 11/25/2017 15:35   Mr Brain Wo Contrast  Result Date: 11/26/2017 CLINICAL DATA:  Blurry vision, intermittent lower extremity weakness. History of end-stage renal disease on  dialysis, stroke, hypertension, hyperlipidemia. EXAM: MRI HEAD WITHOUT CONTRAST MRA HEAD WITHOUT CONTRAST TECHNIQUE: Multiplanar, multiecho pulse sequences of the brain and surrounding structures were obtained without intravenous contrast. Angiographic images of the head were obtained using MRA technique without contrast. COMPARISON:  CT HEAD November 25, 2017 and MRI/MRA head April 10, 2016. FINDINGS: MRI HEAD FINDINGS INTRACRANIAL CONTENTS: No reduced diffusion to suggest acute ischemia. No susceptibility artifact to suggest hemorrhage. Mild parenchymal brain volume loss, increased from prior MRI. No hydrocephalus. Faint supratentorial and LEFT pontine white matter FLAIR T2 hyperintensities. No suspicious parenchymal signal, masses, mass effect. No abnormal extra-axial fluid collections. No extra-axial masses. VASCULAR: Normal major intracranial vascular flow voids present at skull base. SKULL AND UPPER CERVICAL SPINE: No abnormal sellar expansion. No suspicious calvarial bone marrow signal. Craniocervical junction maintained. SINUSES/ORBITS: RIGHT mastoid effusion. LEFT maxillary mucosal retention cyst. Trace paranasal sinus mucosal thickening. Included ocular globes and orbital contents are non-suspicious. OTHER: None. MRA HEAD FINDINGS-mild motion degraded examination. ANTERIOR CIRCULATION: Flow related enhancement of the included cervical, petrous, cavernous and supraclinoid internal carotid arteries. Dolichoectatic petrous, cavernous ICA. Moderate RIGHT and mild LEFT stenosis anterior genu. Patent anterior communicating artery. Patent anterior and middle cerebral arteries. Mild luminal irregularity anterior middle cerebral arteries. No large vessel occlusion, flow limiting stenosis, aneurysm. POSTERIOR CIRCULATION: RIGHT vertebral artery is dominant. Basilar artery is patent, with normal flow related enhancement of the main branch vessels. Patent posterior cerebral arteries, mild luminal irregularity. Small  LEFT posterior communicating artery present. No large vessel occlusion, flow limiting stenosis,  aneurysm. ANATOMIC VARIANTS: None. Source images and MIP images were reviewed. IMPRESSION: MRI HEAD: 1. No acute intracranial process. 2. Mild parenchymal brain volume loss, progressed from 2017. 3. Stable mild chronic small vessel ischemic changes. MRA HEAD: 1. Mildly motion degraded examination. No emergent large vessel occlusion or flow-limiting stenosis. 2. Moderate stenosis RIGHT cavernous ICA, mild on the LEFT. 3. mild intracranial atherosclerosis versus motion artifact. Electronically Signed   By: Elon Alas M.D.   On: 11/26/2017 00:46   Mr Lumbar Spine Wo Contrast  Result Date: 11/26/2017 CLINICAL DATA:  Back pain, weakness. History of end-stage renal disease on dialysis. EXAM: MRI LUMBAR SPINE WITHOUT CONTRAST TECHNIQUE: Multiplanar, multisequence MR imaging of the lumbar spine was performed. No intravenous contrast was administered. COMPARISON:  CT abdomen pelvis November 25, 2017 FINDINGS: Poor signal to noise ratio, potentially related to abdominal distension. Nondiagnostic STIR sequence. SEGMENTATION: For the purposes of this report, the last well-formed intervertebral disc is reported as L5-S1. ALIGNMENT: Maintained lumbar lordosis. No malalignment. VERTEBRAE:Vertebral bodies are intact. Intervertebral discs demonstrate normal morphology and signal characteristics. Pain probable subacute discogenic endplate changes L3. Limited assessment for bone marrow edema. CONUS MEDULLARIS AND CAUDA EQUINA: Nondiagnostic assessment of conus medullaris and cauda equina. PARASPINAL AND OTHER SOFT TISSUES: Limited assessment, non suspicious. DISC LEVELS: L1-2, L2-3: No disc bulge, canal stenosis nor neural foraminal narrowing. L3-4: No disc bulge, canal stenosis nor neural foraminal narrowing. Mild facet arthropathy. L4-5: Small broad-based disc bulge, moderate facet arthropathy and ligamentum flavum redundancy.  No  canal stenosis. Mild-to-moderate RIGHT, moderate LEFT neural foraminal narrowing. L5-S1: Annular bulging, small LEFT subarticular extraforaminal disc protrusion and annular fissure. Mild facet arthropathy. No canal stenosis. Mild LEFT neural foraminal narrowing. IMPRESSION: 1. Habitus and technically limited examination. Limited assessment of bone marrow edema, nondiagnostic assessment of spinal cord and cauda equina. 2. No fracture or malalignment. 3. No canal stenosis. Neural foraminal narrowing L4-5 and L5-S1: Moderate on the LEFT at L4-5. Electronically Signed   By: Elon Alas M.D.   On: 11/26/2017 00:37   Ct Abdomen Pelvis W Contrast  Result Date: 11/25/2017 CLINICAL DATA:  67 year old male experience blurred vision during dialysis. Initial encounter. EXAM: CT ABDOMEN AND PELVIS WITH CONTRAST TECHNIQUE: Multidetector CT imaging of the abdomen and pelvis was performed using the standard protocol following bolus administration of intravenous contrast. CONTRAST:  42mL ISOVUE-300 IOPAMIDOL (ISOVUE-300) INJECTION 61%, 180mL OMNIPAQUE IOHEXOL 300 MG/ML SOLN COMPARISON:  11/04/2017 and 07/13/2009 CT CT. FINDINGS: Lower chest: Mild basilar atelectasis with small left-sided pleural effusion. Cardiomegaly.  Prominent 3 vessel coronary artery calcification. Hepatobiliary: No worrisome hepatic lesion. Possible 4 mm cyst posterior right lobe. 6 mm calcified gallstone. Pancreas: No worrisome pancreatic mass. Spleen: Slightly elongated spleen spanning over 13.6 cm. No focal splenic mass. Adrenals/Urinary Tract: Poorly functioning atrophic kidneys. Low-density structures suggestive of renal cysts, some too small to characterize and 1 appearing calcific (lower pole right kidney). Mild adrenal gland hyperplasia without adrenal mass. Contracted noncontrast filled urinary bladder with limited evaluation. Stomach/Bowel: Evaluation of bowel limited by prominent ascites and areas of under distension. Vascular/Lymphatic:  Prominent atherosclerotic changes aorta and aortic branch vessels. No abdominal aortic aneurysm or large vessel occlusion. Once again noted are increase number of normal to slightly prominent size upper abdominal and retroperitoneal lymph nodes. Reproductive: No acute abnormality. Other: No free air or bowel containing hernia. Third spacing of fluid. Musculoskeletal: No osseous destructive lesion. Mild sclerosis femoral heads without definitive findings of avascular necrosis. Partial fusion sacroiliac joints. Degenerative changes lumbar spine most notable involving L3-4 and L4-5 facet joints. IMPRESSION: Prominent ascites limits evaluation of bowel. Third spacing of fluid. Aortic Atherosclerosis (ICD10-I70.0). Prominent atherosclerotic changes aortic branch vessels and coronary arteries. Top-normal to slightly enlarged upper abdominal and retroperitoneal lymph nodes once again noted, possibly reactive in origin. Slightly elongated spleen spanning over 13.6 cm. 6 mm calcified gallstone. Poorly functioning kidneys. Low-density structures suggestive of renal cysts although some too small to characterize. One right renal cyst is partially calcified. Cardiomegaly. Electronically Signed   By: Genia Del M.D.   On: 11/25/2017 14:36   US Carotid Bilateral (at [facility] Only)  Result Date: 11/26/2017 CLINICAL DATA:  Hypertension, stroke syncope EXAM: BILATERAL CAROTID DUPLEX ULTRASOUND TECHNIQUE: Pearline Cables scale imaging, color Doppler and duplex ultrasound were performed of bilateral carotid and vertebral arteries in the neck. COMPARISON:  None. FINDINGS: Criteria: Quantification of carotid stenosis is based on velocity parameters that correlate the residual internal carotid diameter with NASCET-based stenosis levels, using the diameter of the distal internal carotid lumen as the denominator for stenosis measurement. The following velocity measurements were obtained: RIGHT ICA: 69/12 cm/sec CCA: 157/26 cm/sec SYSTOLIC  ICA/CCA RATIO:  0.7 ECA:  45 cm/sec LEFT ICA: 60/14 cm/sec CCA: 203/55 cm/sec SYSTOLIC ICA/CCA RATIO:  0.5 ECA:  57 cm/sec RIGHT CAROTID ARTERY: Moderate heterogeneous calcified right carotid bifurcation atherosclerosis. Despite this, no hemodynamically significant right ICA stenosis, velocity elevation, turbulent flow. Degree of narrowing less than 50% bilaterally by ultrasound criteria. RIGHT VERTEBRAL ARTERY:  Antegrade LEFT  CAROTID ARTERY: Similar scattered left carotid bifurcation atherosclerosis. Despite this, no hemodynamically significant left ICA stenosis, velocity elevation, or turbulent flow. Degree of narrowing also less than 50%. LEFT VERTEBRAL ARTERY:  Antegrade IMPRESSION: Moderate bilateral carotid atherosclerosis. No hemodynamically significant ICA stenosis. Degree of narrowing less than 50% bilaterally by ultrasound criteria. Patent antegrade vertebral flow bilaterally Electronically Signed   By: Jerilynn Mages.  Shick M.D.   On: 11/26/2017 13:22   US Paracentesis  Result Date: 11/26/2017 INDICATION: Patient with history of end-stage renal disease on dialysis, now with ascites. Request is made for diagnostic and therapeutic paracentesis. EXAM: ULTRASOUND GUIDED DIAGNOSTIC AND THERAPEUTIC PARACENTESIS MEDICATIONS: 10 mL 1% lidocaine COMPLICATIONS: None immediate. PROCEDURE: Informed written consent was obtained from the patient after a discussion of the risks, benefits and alternatives to treatment. A timeout was performed prior to the initiation of the procedure. Initial ultrasound scanning demonstrates a moderate amount of ascites within the right lower abdominal quadrant. The right lower abdomen was prepped and draped in the usual sterile fashion. 1% lidocaine was used for local anesthesia. Following this, a 6 Fr Safe-T-Centesis catheter was introduced. An ultrasound image was saved for documentation purposes. The paracentesis was performed. The catheter was removed and a dressing was applied. The patient  tolerated the procedure well without immediate post procedural complication. FINDINGS: A total of approximately 4.0 liters of clear, yellow fluid was removed. Samples were sent to the laboratory as requested by the clinical team. IMPRESSION: Successful ultrasound-guided diagnostic and therapeutic paracentesis yielding 4.0 liters of peritoneal fluid. Read by: Brynda Greathouse PA-C Electronically Signed   By: Markus Daft M.D.   On: 11/26/2017 11:45   Mr Jodene Nam Head/brain IW Cm  Result Date: 11/26/2017 CLINICAL DATA:  Blurry vision, intermittent lower extremity weakness. History of end-stage renal disease on dialysis, stroke, hypertension, hyperlipidemia. EXAM: MRI HEAD WITHOUT CONTRAST MRA HEAD WITHOUT CONTRAST TECHNIQUE: Multiplanar, multiecho pulse sequences of the brain and surrounding structures were obtained without intravenous contrast. Angiographic images of the head were obtained using MRA technique without contrast. COMPARISON:  CT HEAD November 25, 2017 and MRI/MRA head April 10, 2016. FINDINGS: MRI HEAD FINDINGS INTRACRANIAL CONTENTS: No reduced diffusion to suggest acute ischemia. No susceptibility artifact to suggest hemorrhage. Mild parenchymal brain volume loss, increased from prior MRI. No hydrocephalus. Faint supratentorial and LEFT pontine white matter FLAIR T2 hyperintensities. No suspicious parenchymal signal, masses, mass effect. No abnormal extra-axial fluid collections. No extra-axial masses. VASCULAR: Normal major intracranial vascular flow voids present at skull base. SKULL AND UPPER CERVICAL SPINE: No abnormal sellar expansion. No suspicious calvarial bone marrow signal. Craniocervical junction maintained. SINUSES/ORBITS: RIGHT mastoid effusion. LEFT maxillary mucosal retention cyst. Trace paranasal sinus mucosal thickening. Included ocular globes and orbital contents are non-suspicious. OTHER: None. MRA HEAD FINDINGS-mild motion degraded examination. ANTERIOR CIRCULATION: Flow related  enhancement of the included cervical, petrous, cavernous and supraclinoid internal carotid arteries. Dolichoectatic petrous, cavernous ICA. Moderate RIGHT and mild LEFT stenosis anterior genu. Patent anterior communicating artery. Patent anterior and middle cerebral arteries. Mild luminal irregularity anterior middle cerebral arteries. No large vessel occlusion, flow limiting stenosis, aneurysm. POSTERIOR CIRCULATION: RIGHT vertebral artery is dominant. Basilar artery is patent, with normal flow related enhancement of the main branch vessels. Patent posterior cerebral arteries, mild luminal irregularity. Small LEFT posterior communicating artery present. No large vessel occlusion, flow limiting stenosis,  aneurysm. ANATOMIC VARIANTS: None. Source images and MIP images were reviewed. IMPRESSION: MRI HEAD: 1. No acute intracranial process. 2. Mild parenchymal brain volume loss, progressed from 2017. 3.  Stable mild chronic small vessel ischemic changes. MRA HEAD: 1. Mildly motion degraded examination. No emergent large vessel occlusion or flow-limiting stenosis. 2. Moderate stenosis RIGHT cavernous ICA, mild on the LEFT. 3. mild intracranial atherosclerosis versus motion artifact. Electronically Signed   By: Elon Alas M.D.   On: 11/26/2017 00:46     Medications:    . aspirin EC  81 mg Oral Daily  . gabapentin  300 mg Oral TID  . guaiFENesin  600 mg Oral BID  . hydrALAZINE  50 mg Oral BID  . insulin aspart  0-9 Units Subcutaneous TID WC  . lactobacillus  1 g Oral TID WC  . metoprolol tartrate  25 mg Oral QHS  . multivitamin  1 tablet Oral Daily  . pantoprazole  40 mg Oral BID AC  . rosuvastatin  20 mg Oral QHS  . warfarin  5 mg Oral q1800  . Warfarin - Pharmacist Dosing Inpatient   Does not apply q1800   acetaminophen **OR** acetaminophen (TYLENOL) oral liquid 160 mg/5 mL **OR** acetaminophen  Assessment/ Plan:  67 y.o. male end-stage renal disease, hemodialysis, hypertension, CABG, TIA,  CVA, diabetes insulin-dependent, peripheral vascular disease, admitted post dialysis with abdominal distension.  MWF/[facility]/LUE AVF  1.  ESRD on HD Monday Wednesday Friday 2.  Ascites. 3.  Anemia of chronic kidney disease. 4.  Secondary hyperparathyroidism 5.  Hypertension.  Plan: During the patient's last outpatient hemodialysis he became lightheaded and dizzy.  We will plan for dialysis today with ultrafiltration target of 2 kg.  He also underwent paracentesis yesterday and 4 L of ascites was removed.  We will see how the patient feels after his dialysis session today.  Otherwise disposition as per hospitalist.     LOS: 2 Chenita Ruda 6/19/201912:20 PM

## 2017-11-27 NOTE — Progress Notes (Signed)
Post dialysis assessment 

## 2017-11-27 NOTE — Evaluation (Signed)
Occupational Therapy Evaluation Patient Details Name: Terry Tyler MRN: [id number] DOB: 14-Apr-1951 Today's Date: 11/27/2017    History of Present Illness 67yo male pt admitted to the hospital on 11/25/17 secondary to blurry vision and also RLE weakness (has been chronic but has gotten worse). 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 CT head, MRI/MRA of the brain is negative for acute pathology.  Patient carotid duplex shows no hemodynamically significant carotid stenosis. MRI of the lumbar spine also negative for acute spinal stenosis. Still having some RLE weakness. Vision has improved.     Clinical Impression   Pt seen for OT evaluation this date. Prior to hospital admission, pt was modified independent with AD for mobility (short distances) and requires assist for LB ADL and all IADL. Per pt, spouse unable to provide much assist as she is working full time still. Pt notes he has a daughter who lives locally and helps out when she can, but she also works. Pt lives with his spouse in a mobile home with 5 steps to enter and bilat rails, tub shower, and elevated commode. Pt endorses multiple falls in past 12 months 2:2 his RLE "giving out." He has been unable to get back up after falls and relies on friends/family to assist. Currently pt demonstrates impairments in global strength (RLE worse than LLE), activity tolerance, and balance requiring mod assist for LB ADL and min assist with limited mobility using RW.  Pt would benefit from skilled OT to address noted impairments and functional limitations (see below for any additional details) in order to maximize safety and independence while minimizing falls risk and caregiver burden.  Upon hospital discharge, recommend pt discharge to STR to facilitate safe return home.    Follow Up Recommendations  SNF    Equipment Recommendations  Toilet rise with handles;Tub/shower bench;Other  (comment)(reacher)    Recommendations for Other Services       Precautions / Restrictions Precautions Precautions: Fall Restrictions Weight Bearing Restrictions: No      Mobility Bed Mobility Overal bed mobility: Modified Independent             General bed mobility comments: additional time/effort to perform but ultimately able to do without assist  Transfers Overall transfer level: Needs assistance Equipment used: Rolling walker (2 wheeled) Transfers: Sit to/from Stand Sit to Stand: Min assist         General transfer comment: pt weight shifts to build momentum to stand, requires min assist to complete     Balance Overall balance assessment: Needs assistance Sitting-balance support: Single extremity supported;Feet supported Sitting balance-Leahy Scale: Good     Standing balance support: Bilateral upper extremity supported Standing balance-Leahy Scale: Fair Standing balance comment: with a widened stance, pt able to stand without hand support on RW but unable to accept challenge, cannot maintain balance when attempting marching in place, relying heavily on BUE support on RW                           ADL either performed or assessed with clinical judgement   ADL Overall ADL's : Needs assistance/impaired Eating/Feeding: Sitting;Independent   Grooming: Sitting;Independent   Upper Body Bathing: Sitting;Minimal assistance   Lower Body Bathing: Sit to/from stand;Moderate assistance   Upper Body Dressing : Sitting;Modified independent Upper Body Dressing Details (indicate cue type and reason): + time/effort Lower Body Dressing: Sit to/from stand;Moderate assistance  Toilet Transfer: RW;BSC;Ambulation;Min guard;Minimal assistance   Toileting- Clothing Manipulation and Hygiene: Sitting/lateral lean;Independent       Functional mobility during ADLs: Min guard;Minimal assistance;Rolling walker       Vision Baseline Vision/History: Wears  glasses Wears Glasses: Distance only(doesn't wear often since he no longer drives) Patient Visual Report: No change from baseline(reports vision has improved) Vision Assessment?: No apparent visual deficits     Perception     Praxis      Pertinent Vitals/Pain Pain Assessment: No/denies pain     Hand Dominance Right   Extremity/Trunk Assessment Upper Extremity Assessment Upper Extremity Assessment: Generalized weakness(grossly 4-/5 bilaterally, LUE fistula, intact sensation/coordination)   Lower Extremity Assessment Lower Extremity Assessment: RLE deficits/detail;LLE deficits/detail;Generalized weakness RLE Deficits / Details: grossly 2+/5 hip flexion and knee flexion/ext, dorsiflexion 3+/5, edematous LLE Deficits / Details: grossly 4-/5, intact sensation, edematous    Cervical / Trunk Assessment Cervical / Trunk Assessment: Normal   Communication Communication Communication: No difficulties   Cognition Arousal/Alertness: Awake/alert Behavior During Therapy: WFL for tasks assessed/performed Overall Cognitive Status: Within Functional Limits for tasks assessed                                     General Comments  mild edema in BLE, worse distally    Exercises Other Exercises Other Exercises: Pt educated in falls prevention strategies and functional mobility with RW including hand placement to improve safety/independence   Shoulder Instructions      Home Living Family/patient expects to be discharged to:: Skilled nursing facility                                 Additional Comments: Pt lives in mobile home w/ spouse (provides very little assist and works), 5 steps to enter with bilat rails, tub shower, and elevated toilet.      Prior Functioning/Environment Level of Independence: Needs assistance  Gait / Transfers Assistance Needed: Pt ambulates with 2WW in community, uses a little in the home, and needs assist for negotiating steps ADL's  / Homemaking Assistance Needed: Pt requires assist for sponge bathing, LB dressing, and all IADL including med mgt   Comments: When asked about falls in past 12 months, pt states, "Oh Lord, yes; I fall all the time." Pt attributes this to his RLE "giving out". Pt unable to get up after falls, requires assist.        OT Problem List: Decreased strength;Decreased activity tolerance;Impaired balance (sitting and/or standing);Decreased knowledge of use of DME or AE;Increased edema      OT Treatment/Interventions: Self-care/ADL training;Balance training;Therapeutic exercise;Therapeutic activities;DME and/or AE instruction;Patient/family education;Energy conservation    OT Goals(Current goals can be found in the care plan section) Acute Rehab OT Goals Patient Stated Goal: go stronger and go home OT Goal Formulation: With patient Time For Goal Achievement: 12/11/17 Potential to Achieve Goals: Good  OT Frequency: Min 2X/week   Barriers to D/C: Decreased caregiver support;Inaccessible home environment          Co-evaluation              AM-PAC PT "6 Clicks" Daily Activity     Outcome Measure Help from another person eating meals?: None Help from another person taking care of personal grooming?: None Help from another person toileting, which includes using toliet, bedpan, or urinal?: A Little Help from another person  bathing (including washing, rinsing, drying)?: A Lot Help from another person to put on and taking off regular upper body clothing?: A Little Help from another person to put on and taking off regular lower body clothing?: A Lot 6 Click Score: 18   End of Session Equipment Utilized During Treatment: Gait belt;Rolling walker  Activity Tolerance: Patient tolerated treatment well Patient left: in bed;with call bell/phone within reach;with bed alarm set  OT Visit Diagnosis: Other abnormalities of gait and mobility (R26.89);Repeated falls (R29.6);Muscle weakness  (generalized) (M62.81)                Time: 0272-5366 OT Time Calculation (min): 27 min Charges:  OT General Charges $OT Visit: 1 Visit OT Evaluation $OT Eval Moderate Complexity: 1 Mod OT Treatments $Therapeutic Activity: 8-22 mins  Jeni Salles, MPH, MS, OTR/L ascom [phone] 11/27/17, 10:02 AM

## 2017-11-27 NOTE — Progress Notes (Signed)
This note also relates to the following rows which could not be included: Pulse Rate - Cannot attach notes to unvalidated device data Resp - Cannot attach notes to unvalidated device data BP - Cannot attach notes to unvalidated device data  HD completed

## 2017-11-27 NOTE — Progress Notes (Addendum)
Subjective: Patient up walking with walker today.  No new neurological complaints.  Objective: Current vital signs: BP 106/68 (BP Location: Right Arm)   Pulse 91   Temp 98.7 F (37.1 C) (Oral)   Resp 18   Ht 6' (1.829 m)   Wt 90.7 kg (200 lb)   SpO2 100%   BMI 27.12 kg/m  Vital signs in last 24 hours: Temp:  [98.7 F (37.1 C)] 98.7 F (37.1 C) (06/19 0515) Pulse Rate:  [89-91] 91 (06/19 0515) Resp:  [18] 18 (06/19 0515) BP: (106-116)/(68-71) 106/68 (06/19 0515) SpO2:  [95 %-100 %] 100 % (06/19 0515)  Intake/Output from previous day: 06/18 0701 - 06/19 0700 In: 360 [P.O.:360] Out: -  Intake/Output this shift: Total I/O In: 240 [P.O.:240] Out: -  Nutritional status:  Diet Order           Diet renal/carb modified with fluid restriction Diet-HS Snack? Nothing; Fluid restriction: 1200 mL Fluid; Room service appropriate? Yes; Fluid consistency: Thin  Diet effective now          Neurologic Exam: Mental Status: Alert, oriented, thought content appropriate.  Speech fluent without evidence of aphasia.  Able to follow 3 step commands without difficulty. Cranial Nerves: II: Discs flat bilaterally; Visual fields grossly normal, pupils equal, round, reactive to light and accommodation III,IV, VI: ptosis not present, extra-ocular motions intact bilaterally V,VII: smile symmetric, facial light touch sensation normal bilaterally VIII: hearing normal bilaterally IX,X: gag reflex present XI: bilateral shoulder shrug XII: midline tongue extension Motor: Right :  Upper extremity   5/5                                      Left:     Upper extremity   5/5             Lower extremity   3/5  proximally, 4+/5 distally                                                Lower extremity   5-/5 Tone and bulk:normal tone throughout; no atrophy noted Sensory: Pinprick and light touch intact throughout, bilaterally    Lab Results: Basic Metabolic Panel: Recent Labs  Lab 11/25/17 1144  NA 136   K 3.4*  CL 93*  CO2 27  GLUCOSE 143*  BUN 14  CREATININE 3.40*  CALCIUM 7.9*    Liver Function Tests: Recent Labs  Lab 11/25/17 1144  AST 29  ALT 16*  ALKPHOS 285*  BILITOT 1.2  PROT 8.2*  ALBUMIN 2.7*   No results for input(s): LIPASE, AMYLASE in the last 168 hours. No results for input(s): AMMONIA in the last 168 hours.  CBC: Recent Labs  Lab 11/25/17 1144  WBC 8.8  HGB 10.4*  HCT 30.3*  MCV 77.7*  PLT 251    Cardiac Enzymes: No results for input(s): CKTOTAL, CKMB, CKMBINDEX, TROPONINI in the last 168 hours.  Lipid Panel: Recent Labs  Lab 11/26/17 0351  CHOL 82  TRIG 84  HDL 34*  CHOLHDL 2.4  VLDL 17  LDLCALC 31    CBG: Recent Labs  Lab 11/26/17 1222 11/26/17 1637 11/26/17 2106 11/27/17 0734 11/27/17 1156  GLUCAP 171* 135* 118* 112* 155*    Microbiology: Results for orders placed or performed  during the hospital encounter of 11/25/17  Body fluid culture     Status: None (Preliminary result)   Collection Time: 11/26/17 10:30 AM  Result Value Ref Range Status   Specimen Description   Final    PERITONEAL Performed at [facility], [address], [place], [place] [address]    Special Requests   Final    PERITONEAL Performed at [facility], [place], [place] [address]    Gram Stain   Final    MODERATE WBC PRESENT,BOTH PMN AND MONONUCLEAR NO ORGANISMS SEEN    Culture   Final    NO GROWTH < 24 HOURS Performed at [facility], [place] [address] [place], [place] [address]    Report Status PENDING  Incomplete    Coagulation Studies: Recent Labs    11/25/17 1952 11/26/17 0351 11/27/17 0552  LABPROT 18.8* 20.0* 22.2*  INR 1.59 1.72 1.96    Imaging: Ct Head Wo Contrast  Result Date: 11/25/2017 CLINICAL DATA:  Blurry vision and weakness EXAM: CT HEAD WITHOUT CONTRAST TECHNIQUE: Contiguous axial images were obtained from the base of the skull through the vertex without intravenous contrast.  COMPARISON:  04/09/2016 FINDINGS: Brain: No evidence of acute infarction, hemorrhage, hydrocephalus, extra-axial collection or mass lesion/mass effect. Vascular: No hyperdense vessel or unexpected calcification. Diffuse calcification of subcutaneous vessels is noted. This is consistent with the patient's given clinical history of end-stage renal disease. Skull: Normal. Negative for fracture or focal lesion. Sinuses/Orbits: No acute finding. Other: None. IMPRESSION: No acute intracranial abnormality noted. Electronically Signed   By: Inez Catalina M.D.   On: 11/25/2017 15:35   Mr Brain Wo Contrast  Result Date: 11/26/2017 CLINICAL DATA:  Blurry vision, intermittent lower extremity weakness. History of end-stage renal disease on dialysis, stroke, hypertension, hyperlipidemia. EXAM: MRI HEAD WITHOUT CONTRAST MRA HEAD WITHOUT CONTRAST TECHNIQUE: Multiplanar, multiecho pulse sequences of the brain and surrounding structures were obtained without intravenous contrast. Angiographic images of the head were obtained using MRA technique without contrast. COMPARISON:  CT HEAD November 25, 2017 and MRI/MRA head April 10, 2016. FINDINGS: MRI HEAD FINDINGS INTRACRANIAL CONTENTS: No reduced diffusion to suggest acute ischemia. No susceptibility artifact to suggest hemorrhage. Mild parenchymal brain volume loss, increased from prior MRI. No hydrocephalus. Faint supratentorial and LEFT pontine white matter FLAIR T2 hyperintensities. No suspicious parenchymal signal, masses, mass effect. No abnormal extra-axial fluid collections. No extra-axial masses. VASCULAR: Normal major intracranial vascular flow voids present at skull base. SKULL AND UPPER CERVICAL SPINE: No abnormal sellar expansion. No suspicious calvarial bone marrow signal. Craniocervical junction maintained. SINUSES/ORBITS: RIGHT mastoid effusion. LEFT maxillary mucosal retention cyst. Trace paranasal sinus mucosal thickening. Included ocular globes and orbital contents  are non-suspicious. OTHER: None. MRA HEAD FINDINGS-mild motion degraded examination. ANTERIOR CIRCULATION: Flow related enhancement of the included cervical, petrous, cavernous and supraclinoid internal carotid arteries. Dolichoectatic petrous, cavernous ICA. Moderate RIGHT and mild LEFT stenosis anterior genu. Patent anterior communicating artery. Patent anterior and middle cerebral arteries. Mild luminal irregularity anterior middle cerebral arteries. No large vessel occlusion, flow limiting stenosis, aneurysm. POSTERIOR CIRCULATION: RIGHT vertebral artery is dominant. Basilar artery is patent, with normal flow related enhancement of the main branch vessels. Patent posterior cerebral arteries, mild luminal irregularity. Small LEFT posterior communicating artery present. No large vessel occlusion, flow limiting stenosis,  aneurysm. ANATOMIC VARIANTS: None. Source images and MIP images were reviewed. IMPRESSION: MRI HEAD: 1. No acute intracranial process. 2. Mild parenchymal brain volume loss, progressed from 2017. 3.  Stable mild chronic small vessel ischemic changes. MRA HEAD: 1. Mildly motion degraded examination. No emergent large vessel occlusion or flow-limiting stenosis. 2. Moderate stenosis RIGHT cavernous ICA, mild on the LEFT. 3. mild intracranial atherosclerosis versus motion artifact. Electronically Signed   By: Elon Alas M.D.   On: 11/26/2017 00:46   Mr Lumbar Spine Wo Contrast  Result Date: 11/26/2017 CLINICAL DATA:  Back pain, weakness. History of end-stage renal disease on dialysis. EXAM: MRI LUMBAR SPINE WITHOUT CONTRAST TECHNIQUE: Multiplanar, multisequence MR imaging of the lumbar spine was performed. No intravenous contrast was administered. COMPARISON:  CT abdomen pelvis November 25, 2017 FINDINGS: Poor signal to noise ratio, potentially related to abdominal distension. Nondiagnostic STIR sequence. SEGMENTATION: For the purposes of this report, the last well-formed intervertebral disc is  reported as L5-S1. ALIGNMENT: Maintained lumbar lordosis. No malalignment. VERTEBRAE:Vertebral bodies are intact. Intervertebral discs demonstrate normal morphology and signal characteristics. Pain probable subacute discogenic endplate changes L3. Limited assessment for bone marrow edema. CONUS MEDULLARIS AND CAUDA EQUINA: Nondiagnostic assessment of conus medullaris and cauda equina. PARASPINAL AND OTHER SOFT TISSUES: Limited assessment, non suspicious. DISC LEVELS: L1-2, L2-3: No disc bulge, canal stenosis nor neural foraminal narrowing. L3-4: No disc bulge, canal stenosis nor neural foraminal narrowing. Mild facet arthropathy. L4-5: Small broad-based disc bulge, moderate facet arthropathy and ligamentum flavum redundancy. No canal stenosis. Mild-to-moderate RIGHT, moderate LEFT neural foraminal narrowing. L5-S1: Annular bulging, small LEFT subarticular extraforaminal disc protrusion and annular fissure. Mild facet arthropathy. No canal stenosis. Mild LEFT neural foraminal narrowing. IMPRESSION: 1. Habitus and technically limited examination. Limited assessment of bone marrow edema, nondiagnostic assessment of spinal cord and cauda equina. 2. No fracture or malalignment. 3. No canal stenosis. Neural foraminal narrowing L4-5 and L5-S1: Moderate on the LEFT at L4-5. Electronically Signed   By: Elon Alas M.D.   On: 11/26/2017 00:37   Ct Abdomen Pelvis W Contrast  Result Date: 11/25/2017 CLINICAL DATA:  67 year old male experience blurred vision during dialysis. Initial encounter. EXAM: CT ABDOMEN AND PELVIS WITH CONTRAST TECHNIQUE: Multidetector CT imaging of the abdomen and pelvis was performed using the standard protocol following bolus administration of intravenous contrast. CONTRAST:  51mL ISOVUE-300 IOPAMIDOL (ISOVUE-300) INJECTION 61%, 146mL OMNIPAQUE IOHEXOL 300 MG/ML SOLN COMPARISON:  11/04/2017 and 07/13/2009 CT CT. FINDINGS: Lower chest: Mild basilar atelectasis with small left-sided pleural  effusion. Cardiomegaly.  Prominent 3 vessel coronary artery calcification. Hepatobiliary: No worrisome hepatic lesion. Possible 4 mm cyst posterior right lobe. 6 mm calcified gallstone. Pancreas: No worrisome pancreatic mass. Spleen: Slightly elongated spleen spanning over 13.6 cm. No focal splenic mass. Adrenals/Urinary Tract: Poorly functioning atrophic kidneys. Low-density structures suggestive of renal cysts, some too small to characterize and 1 appearing calcific (lower pole right kidney). Mild adrenal gland hyperplasia without adrenal mass. Contracted noncontrast filled urinary bladder with limited evaluation. Stomach/Bowel: Evaluation of bowel limited by prominent ascites and areas of under distension. Vascular/Lymphatic: Prominent atherosclerotic changes aorta and aortic branch vessels. No abdominal aortic aneurysm or large vessel occlusion. Once again noted are increase number of normal to slightly prominent size upper abdominal and retroperitoneal lymph nodes. Reproductive: No acute abnormality. Other: No free air or bowel containing hernia. Third spacing of fluid. Musculoskeletal: No osseous destructive lesion. Mild sclerosis femoral heads without definitive findings of avascular necrosis. Partial fusion sacroiliac joints. Degenerative changes lumbar spine most notable involving L3-4 and L4-5 facet joints. IMPRESSION: Prominent ascites limits evaluation of bowel. Third spacing of fluid. Aortic Atherosclerosis (ICD10-I70.0). Prominent atherosclerotic changes aortic branch vessels and coronary  arteries. Top-normal to slightly enlarged upper abdominal and retroperitoneal lymph nodes once again noted, possibly reactive in origin. Slightly elongated spleen spanning over 13.6 cm. 6 mm calcified gallstone. Poorly functioning kidneys. Low-density structures suggestive of renal cysts although some too small to characterize. One right renal cyst is partially calcified. Cardiomegaly. Electronically Signed   By:  Genia Del M.D.   On: 11/25/2017 14:36   US Carotid Bilateral (at [facility] Only)  Result Date: 11/26/2017 CLINICAL DATA:  Hypertension, stroke syncope EXAM: BILATERAL CAROTID DUPLEX ULTRASOUND TECHNIQUE: Pearline Cables scale imaging, color Doppler and duplex ultrasound were performed of bilateral carotid and vertebral arteries in the neck. COMPARISON:  None. FINDINGS: Criteria: Quantification of carotid stenosis is based on velocity parameters that correlate the residual internal carotid diameter with NASCET-based stenosis levels, using the diameter of the distal internal carotid lumen as the denominator for stenosis measurement. The following velocity measurements were obtained: RIGHT ICA: 69/12 cm/sec CCA: 295/28 cm/sec SYSTOLIC ICA/CCA RATIO:  0.7 ECA:  45 cm/sec LEFT ICA: 60/14 cm/sec CCA: 413/24 cm/sec SYSTOLIC ICA/CCA RATIO:  0.5 ECA:  57 cm/sec RIGHT CAROTID ARTERY: Moderate heterogeneous calcified right carotid bifurcation atherosclerosis. Despite this, no hemodynamically significant right ICA stenosis, velocity elevation, turbulent flow. Degree of narrowing less than 50% bilaterally by ultrasound criteria. RIGHT VERTEBRAL ARTERY:  Antegrade LEFT CAROTID ARTERY: Similar scattered left carotid bifurcation atherosclerosis. Despite this, no hemodynamically significant left ICA stenosis, velocity elevation, or turbulent flow. Degree of narrowing also less than 50%. LEFT VERTEBRAL ARTERY:  Antegrade IMPRESSION: Moderate bilateral carotid atherosclerosis. No hemodynamically significant ICA stenosis. Degree of narrowing less than 50% bilaterally by ultrasound criteria. Patent antegrade vertebral flow bilaterally Electronically Signed   By: Jerilynn Mages.  Shick M.D.   On: 11/26/2017 13:22   US Paracentesis  Result Date: 11/26/2017 INDICATION: Patient with history of end-stage renal disease on dialysis, now with ascites. Request is made for diagnostic and therapeutic paracentesis. EXAM: ULTRASOUND GUIDED DIAGNOSTIC AND  THERAPEUTIC PARACENTESIS MEDICATIONS: 10 mL 1% lidocaine COMPLICATIONS: None immediate. PROCEDURE: Informed written consent was obtained from the patient after a discussion of the risks, benefits and alternatives to treatment. A timeout was performed prior to the initiation of the procedure. Initial ultrasound scanning demonstrates a moderate amount of ascites within the right lower abdominal quadrant. The right lower abdomen was prepped and draped in the usual sterile fashion. 1% lidocaine was used for local anesthesia. Following this, a 6 Fr Safe-T-Centesis catheter was introduced. An ultrasound image was saved for documentation purposes. The paracentesis was performed. The catheter was removed and a dressing was applied. The patient tolerated the procedure well without immediate post procedural complication. FINDINGS: A total of approximately 4.0 liters of clear, yellow fluid was removed. Samples were sent to the laboratory as requested by the clinical team. IMPRESSION: Successful ultrasound-guided diagnostic and therapeutic paracentesis yielding 4.0 liters of peritoneal fluid. Read by: Brynda Greathouse PA-C Electronically Signed   By: Markus Daft M.D.   On: 11/26/2017 11:45   Mr Jodene Nam Head/brain MW Cm  Result Date: 11/26/2017 CLINICAL DATA:  Blurry vision, intermittent lower extremity weakness. History of end-stage renal disease on dialysis, stroke, hypertension, hyperlipidemia. EXAM: MRI HEAD WITHOUT CONTRAST MRA HEAD WITHOUT CONTRAST TECHNIQUE: Multiplanar, multiecho pulse sequences of the brain and surrounding structures were obtained without intravenous contrast. Angiographic images of the head were obtained using MRA technique without contrast. COMPARISON:  CT HEAD November 25, 2017 and MRI/MRA head April 10, 2016. FINDINGS: MRI HEAD FINDINGS INTRACRANIAL CONTENTS: No reduced diffusion to suggest  acute ischemia. No susceptibility artifact to suggest hemorrhage. Mild parenchymal brain volume loss, increased  from prior MRI. No hydrocephalus. Faint supratentorial and LEFT pontine white matter FLAIR T2 hyperintensities. No suspicious parenchymal signal, masses, mass effect. No abnormal extra-axial fluid collections. No extra-axial masses. VASCULAR: Normal major intracranial vascular flow voids present at skull base. SKULL AND UPPER CERVICAL SPINE: No abnormal sellar expansion. No suspicious calvarial bone marrow signal. Craniocervical junction maintained. SINUSES/ORBITS: RIGHT mastoid effusion. LEFT maxillary mucosal retention cyst. Trace paranasal sinus mucosal thickening. Included ocular globes and orbital contents are non-suspicious. OTHER: None. MRA HEAD FINDINGS-mild motion degraded examination. ANTERIOR CIRCULATION: Flow related enhancement of the included cervical, petrous, cavernous and supraclinoid internal carotid arteries. Dolichoectatic petrous, cavernous ICA. Moderate RIGHT and mild LEFT stenosis anterior genu. Patent anterior communicating artery. Patent anterior and middle cerebral arteries. Mild luminal irregularity anterior middle cerebral arteries. No large vessel occlusion, flow limiting stenosis, aneurysm. POSTERIOR CIRCULATION: RIGHT vertebral artery is dominant. Basilar artery is patent, with normal flow related enhancement of the main branch vessels. Patent posterior cerebral arteries, mild luminal irregularity. Small LEFT posterior communicating artery present. No large vessel occlusion, flow limiting stenosis,  aneurysm. ANATOMIC VARIANTS: None. Source images and MIP images were reviewed. IMPRESSION: MRI HEAD: 1. No acute intracranial process. 2. Mild parenchymal brain volume loss, progressed from 2017. 3. Stable mild chronic small vessel ischemic changes. MRA HEAD: 1. Mildly motion degraded examination. No emergent large vessel occlusion or flow-limiting stenosis. 2. Moderate stenosis RIGHT cavernous ICA, mild on the LEFT. 3. mild intracranial atherosclerosis versus motion artifact.  Electronically Signed   By: Elon Alas M.D.   On: 11/26/2017 00:46    Medications:  I have reviewed the patient's current medications. Scheduled: . aspirin EC  81 mg Oral Daily  . gabapentin  300 mg Oral TID  . guaiFENesin  600 mg Oral BID  . hydrALAZINE  50 mg Oral BID  . insulin aspart  0-9 Units Subcutaneous TID WC  . lactobacillus  1 g Oral TID WC  . metoprolol tartrate  25 mg Oral QHS  . multivitamin  1 tablet Oral Daily  . pantoprazole  40 mg Oral BID AC  . rosuvastatin  20 mg Oral QHS  . warfarin  5 mg Oral q1800  . Warfarin - Pharmacist Dosing Inpatient   Does not apply q1800    Assessment/Plan: Patient improved.  Ambulating with walker.  Patient continues on ASA and Coumadin  No further inpatient neurologic intervention is recommended at this time.  If further questions arise, please call or page at that time.  Thank you for allowing neurology to participate in the care of this patient.    LOS: 2 days   Alexis Goodell, MD Neurology [phone] 11/27/2017  1:00 PM

## 2017-11-27 NOTE — Progress Notes (Signed)
This note also relates to the following rows which could not be included: Pulse Rate - Cannot attach notes to unvalidated device data Resp - Cannot attach notes to unvalidated device data SpO2 - Cannot attach notes to unvalidated device data  Hd started  

## 2017-11-27 NOTE — Progress Notes (Signed)
[facility] NAME: Terry Tyler    MR#:  [id number]  DATE OF BIRTH:  1951/06/08  SUBJECTIVE:   Weakness in the right lower extremity slightly improved, patient ambulated with physical therapy using a walker today.  Still having some belching and abdominal distention.  Patient was seen by speech and the recommend doing a upper GI series which is ordered for tomorrow.  REVIEW OF SYSTEMS:    Review of Systems  Constitutional: Negative for chills and fever.  HENT: Negative for congestion and tinnitus.   Eyes: Negative for blurred vision and double vision.  Respiratory: Negative for cough, shortness of breath and wheezing.   Cardiovascular: Negative for chest pain, orthopnea and PND.  Gastrointestinal: Negative for abdominal pain, diarrhea, nausea and vomiting.  Genitourinary: Negative for dysuria and hematuria.  Neurological: Positive for focal weakness (Right leg weakness). Negative for dizziness and sensory change.  All other systems reviewed and are negative.   Nutrition: renal/carb modified Tolerating Diet: Yes Tolerating PT: Eval noted.     DRUG ALLERGIES:   Allergies  Allergen Reactions  . Lisinopril Swelling  . Atorvastatin Calcium Other (See Comments)    Muscle cramps   . Ezetimibe Other (See Comments)    Cannot remember what the allergy was.  . Lipitor [Atorvastatin] Other (See Comments)    Muscle cramping    VITALS:  Blood pressure 116/77, pulse 85, temperature 98.5 F (36.9 C), temperature source Oral, resp. rate 18, height 6' (1.829 m), weight 90.7 kg (200 lb), SpO2 97 %.  PHYSICAL EXAMINATION:   Physical Exam  GENERAL:  67 y.o.-year-old patient sitting up in bed in no acute distress.  EYES: Pupils equal, round, reactive to light and accommodation. No scleral icterus. Extraocular muscles intact.  HEENT: Head atraumatic, normocephalic. Oropharynx and nasopharynx clear.  NECK:  Supple, no jugular venous  distention. No thyroid enlargement, no tenderness.  LUNGS: Normal breath sounds bilaterally, no wheezing, rales, rhonchi. No use of accessory muscles of respiration.  CARDIOVASCULAR: S1, S2 normal. No murmurs, rubs, or gallops.  ABDOMEN: Soft, nontender, distended. Bowel sounds present. No organomegaly or mass.  EXTREMITIES: No cyanosis, clubbing or edema b/l.    NEUROLOGIC: Cranial nerves II through XII are intact.  Right lower extremity weakness 3 out of 5 strength. PSYCHIATRIC: The patient is alert and oriented x 3.  SKIN: No obvious rash, lesion, or ulcer.   Right upper extremity AV fistula with good bruit and thrill.  LABORATORY PANEL:   CBC Recent Labs  Lab 11/25/17 1144  WBC 8.8  HGB 10.4*  HCT 30.3*  PLT 251   ------------------------------------------------------------------------------------------------------------------  Chemistries  Recent Labs  Lab 11/25/17 1144  NA 136  K 3.4*  CL 93*  CO2 27  GLUCOSE 143*  BUN 14  CREATININE 3.40*  CALCIUM 7.9*  AST 29  ALT 16*  ALKPHOS 285*  BILITOT 1.2   ------------------------------------------------------------------------------------------------------------------  Cardiac Enzymes No results for input(s): TROPONINI in the last 168 hours. ------------------------------------------------------------------------------------------------------------------  RADIOLOGY:  Ct Head Wo Contrast  Result Date: 11/25/2017 CLINICAL DATA:  Blurry vision and weakness EXAM: CT HEAD WITHOUT CONTRAST TECHNIQUE: Contiguous axial images were obtained from the base of the skull through the vertex without intravenous contrast. COMPARISON:  04/09/2016 FINDINGS: Brain: No evidence of acute infarction, hemorrhage, hydrocephalus, extra-axial collection or mass lesion/mass effect. Vascular: No hyperdense vessel or unexpected calcification. Diffuse calcification of subcutaneous vessels is noted. This is consistent with the patient's given  clinical history of end-stage  renal disease. Skull: Normal. Negative for fracture or focal lesion. Sinuses/Orbits: No acute finding. Other: None. IMPRESSION: No acute intracranial abnormality noted. Electronically Signed   By: Inez Catalina M.D.   On: 11/25/2017 15:35   Mr Brain Wo Contrast  Result Date: 11/26/2017 CLINICAL DATA:  Blurry vision, intermittent lower extremity weakness. History of end-stage renal disease on dialysis, stroke, hypertension, hyperlipidemia. EXAM: MRI HEAD WITHOUT CONTRAST MRA HEAD WITHOUT CONTRAST TECHNIQUE: Multiplanar, multiecho pulse sequences of the brain and surrounding structures were obtained without intravenous contrast. Angiographic images of the head were obtained using MRA technique without contrast. COMPARISON:  CT HEAD November 25, 2017 and MRI/MRA head April 10, 2016. FINDINGS: MRI HEAD FINDINGS INTRACRANIAL CONTENTS: No reduced diffusion to suggest acute ischemia. No susceptibility artifact to suggest hemorrhage. Mild parenchymal brain volume loss, increased from prior MRI. No hydrocephalus. Faint supratentorial and LEFT pontine white matter FLAIR T2 hyperintensities. No suspicious parenchymal signal, masses, mass effect. No abnormal extra-axial fluid collections. No extra-axial masses. VASCULAR: Normal major intracranial vascular flow voids present at skull base. SKULL AND UPPER CERVICAL SPINE: No abnormal sellar expansion. No suspicious calvarial bone marrow signal. Craniocervical junction maintained. SINUSES/ORBITS: RIGHT mastoid effusion. LEFT maxillary mucosal retention cyst. Trace paranasal sinus mucosal thickening. Included ocular globes and orbital contents are non-suspicious. OTHER: None. MRA HEAD FINDINGS-mild motion degraded examination. ANTERIOR CIRCULATION: Flow related enhancement of the included cervical, petrous, cavernous and supraclinoid internal carotid arteries. Dolichoectatic petrous, cavernous ICA. Moderate RIGHT and mild LEFT stenosis anterior genu.  Patent anterior communicating artery. Patent anterior and middle cerebral arteries. Mild luminal irregularity anterior middle cerebral arteries. No large vessel occlusion, flow limiting stenosis, aneurysm. POSTERIOR CIRCULATION: RIGHT vertebral artery is dominant. Basilar artery is patent, with normal flow related enhancement of the main branch vessels. Patent posterior cerebral arteries, mild luminal irregularity. Small LEFT posterior communicating artery present. No large vessel occlusion, flow limiting stenosis,  aneurysm. ANATOMIC VARIANTS: None. Source images and MIP images were reviewed. IMPRESSION: MRI HEAD: 1. No acute intracranial process. 2. Mild parenchymal brain volume loss, progressed from 2017. 3. Stable mild chronic small vessel ischemic changes. MRA HEAD: 1. Mildly motion degraded examination. No emergent large vessel occlusion or flow-limiting stenosis. 2. Moderate stenosis RIGHT cavernous ICA, mild on the LEFT. 3. mild intracranial atherosclerosis versus motion artifact. Electronically Signed   By: Elon Alas M.D.   On: 11/26/2017 00:46   Mr Lumbar Spine Wo Contrast  Result Date: 11/26/2017 CLINICAL DATA:  Back pain, weakness. History of end-stage renal disease on dialysis. EXAM: MRI LUMBAR SPINE WITHOUT CONTRAST TECHNIQUE: Multiplanar, multisequence MR imaging of the lumbar spine was performed. No intravenous contrast was administered. COMPARISON:  CT abdomen pelvis November 25, 2017 FINDINGS: Poor signal to noise ratio, potentially related to abdominal distension. Nondiagnostic STIR sequence. SEGMENTATION: For the purposes of this report, the last well-formed intervertebral disc is reported as L5-S1. ALIGNMENT: Maintained lumbar lordosis. No malalignment. VERTEBRAE:Vertebral bodies are intact. Intervertebral discs demonstrate normal morphology and signal characteristics. Pain probable subacute discogenic endplate changes L3. Limited assessment for bone marrow edema. CONUS MEDULLARIS AND  CAUDA EQUINA: Nondiagnostic assessment of conus medullaris and cauda equina. PARASPINAL AND OTHER SOFT TISSUES: Limited assessment, non suspicious. DISC LEVELS: L1-2, L2-3: No disc bulge, canal stenosis nor neural foraminal narrowing. L3-4: No disc bulge, canal stenosis nor neural foraminal narrowing. Mild facet arthropathy. L4-5: Small broad-based disc bulge, moderate facet arthropathy and ligamentum flavum redundancy. No canal stenosis. Mild-to-moderate RIGHT, moderate LEFT neural foraminal narrowing. L5-S1: Annular bulging, small LEFT subarticular extraforaminal  disc protrusion and annular fissure. Mild facet arthropathy. No canal stenosis. Mild LEFT neural foraminal narrowing. IMPRESSION: 1. Habitus and technically limited examination. Limited assessment of bone marrow edema, nondiagnostic assessment of spinal cord and cauda equina. 2. No fracture or malalignment. 3. No canal stenosis. Neural foraminal narrowing L4-5 and L5-S1: Moderate on the LEFT at L4-5. Electronically Signed   By: Elon Alas M.D.   On: 11/26/2017 00:37   US Carotid Bilateral (at [facility] Only)  Result Date: 11/26/2017 CLINICAL DATA:  Hypertension, stroke syncope EXAM: BILATERAL CAROTID DUPLEX ULTRASOUND TECHNIQUE: Pearline Cables scale imaging, color Doppler and duplex ultrasound were performed of bilateral carotid and vertebral arteries in the neck. COMPARISON:  None. FINDINGS: Criteria: Quantification of carotid stenosis is based on velocity parameters that correlate the residual internal carotid diameter with NASCET-based stenosis levels, using the diameter of the distal internal carotid lumen as the denominator for stenosis measurement. The following velocity measurements were obtained: RIGHT ICA: 69/12 cm/sec CCA: 756/43 cm/sec SYSTOLIC ICA/CCA RATIO:  0.7 ECA:  45 cm/sec LEFT ICA: 60/14 cm/sec CCA: 329/51 cm/sec SYSTOLIC ICA/CCA RATIO:  0.5 ECA:  57 cm/sec RIGHT CAROTID ARTERY: Moderate heterogeneous calcified right carotid  bifurcation atherosclerosis. Despite this, no hemodynamically significant right ICA stenosis, velocity elevation, turbulent flow. Degree of narrowing less than 50% bilaterally by ultrasound criteria. RIGHT VERTEBRAL ARTERY:  Antegrade LEFT CAROTID ARTERY: Similar scattered left carotid bifurcation atherosclerosis. Despite this, no hemodynamically significant left ICA stenosis, velocity elevation, or turbulent flow. Degree of narrowing also less than 50%. LEFT VERTEBRAL ARTERY:  Antegrade IMPRESSION: Moderate bilateral carotid atherosclerosis. No hemodynamically significant ICA stenosis. Degree of narrowing less than 50% bilaterally by ultrasound criteria. Patent antegrade vertebral flow bilaterally Electronically Signed   By: Jerilynn Mages.  Shick M.D.   On: 11/26/2017 13:22   US Paracentesis  Result Date: 11/26/2017 INDICATION: Patient with history of end-stage renal disease on dialysis, now with ascites. Request is made for diagnostic and therapeutic paracentesis. EXAM: ULTRASOUND GUIDED DIAGNOSTIC AND THERAPEUTIC PARACENTESIS MEDICATIONS: 10 mL 1% lidocaine COMPLICATIONS: None immediate. PROCEDURE: Informed written consent was obtained from the patient after a discussion of the risks, benefits and alternatives to treatment. A timeout was performed prior to the initiation of the procedure. Initial ultrasound scanning demonstrates a moderate amount of ascites within the right lower abdominal quadrant. The right lower abdomen was prepped and draped in the usual sterile fashion. 1% lidocaine was used for local anesthesia. Following this, a 6 Fr Safe-T-Centesis catheter was introduced. An ultrasound image was saved for documentation purposes. The paracentesis was performed. The catheter was removed and a dressing was applied. The patient tolerated the procedure well without immediate post procedural complication. FINDINGS: A total of approximately 4.0 liters of clear, yellow fluid was removed. Samples were sent to the  laboratory as requested by the clinical team. IMPRESSION: Successful ultrasound-guided diagnostic and therapeutic paracentesis yielding 4.0 liters of peritoneal fluid. Read by: Brynda Greathouse PA-C Electronically Signed   By: Markus Daft M.D.   On: 11/26/2017 11:45   Mr Jodene Nam Head/brain OA Cm  Result Date: 11/26/2017 CLINICAL DATA:  Blurry vision, intermittent lower extremity weakness. History of end-stage renal disease on dialysis, stroke, hypertension, hyperlipidemia. EXAM: MRI HEAD WITHOUT CONTRAST MRA HEAD WITHOUT CONTRAST TECHNIQUE: Multiplanar, multiecho pulse sequences of the brain and surrounding structures were obtained without intravenous contrast. Angiographic images of the head were obtained using MRA technique without contrast. COMPARISON:  CT HEAD November 25, 2017 and MRI/MRA head April 10, 2016. FINDINGS: MRI HEAD FINDINGS INTRACRANIAL  CONTENTS: No reduced diffusion to suggest acute ischemia. No susceptibility artifact to suggest hemorrhage. Mild parenchymal brain volume loss, increased from prior MRI. No hydrocephalus. Faint supratentorial and LEFT pontine white matter FLAIR T2 hyperintensities. No suspicious parenchymal signal, masses, mass effect. No abnormal extra-axial fluid collections. No extra-axial masses. VASCULAR: Normal major intracranial vascular flow voids present at skull base. SKULL AND UPPER CERVICAL SPINE: No abnormal sellar expansion. No suspicious calvarial bone marrow signal. Craniocervical junction maintained. SINUSES/ORBITS: RIGHT mastoid effusion. LEFT maxillary mucosal retention cyst. Trace paranasal sinus mucosal thickening. Included ocular globes and orbital contents are non-suspicious. OTHER: None. MRA HEAD FINDINGS-mild motion degraded examination. ANTERIOR CIRCULATION: Flow related enhancement of the included cervical, petrous, cavernous and supraclinoid internal carotid arteries. Dolichoectatic petrous, cavernous ICA. Moderate RIGHT and mild LEFT stenosis anterior genu.  Patent anterior communicating artery. Patent anterior and middle cerebral arteries. Mild luminal irregularity anterior middle cerebral arteries. No large vessel occlusion, flow limiting stenosis, aneurysm. POSTERIOR CIRCULATION: RIGHT vertebral artery is dominant. Basilar artery is patent, with normal flow related enhancement of the main branch vessels. Patent posterior cerebral arteries, mild luminal irregularity. Small LEFT posterior communicating artery present. No large vessel occlusion, flow limiting stenosis,  aneurysm. ANATOMIC VARIANTS: None. Source images and MIP images were reviewed. IMPRESSION: MRI HEAD: 1. No acute intracranial process. 2. Mild parenchymal brain volume loss, progressed from 2017. 3. Stable mild chronic small vessel ischemic changes. MRA HEAD: 1. Mildly motion degraded examination. No emergent large vessel occlusion or flow-limiting stenosis. 2. Moderate stenosis RIGHT cavernous ICA, mild on the LEFT. 3. mild intracranial atherosclerosis versus motion artifact. Electronically Signed   By: Elon Alas M.D.   On: 11/26/2017 00:46     ASSESSMENT AND PLAN:   67 year old male with past medical history of end-stage renal disease on hemodialysis, essential hypertension, hyperlipidemia, diabetes, history of previous CVA, peripheral vascular disease who presents to the hospital due to blurry vision, and difficulty moving his right lower extremity.  1.  Blurry vision/right lower extremity weakness- initially patient was admitted for suspicion of underlying CVA. -Patient's neurologic work-up although has been negative.  CT head, MRI/MRA of the brain is negative for acute pathology.  Patient carotid duplex shows no hemodynamically significant carotid stenosis. -Seen by neurology and they think this is likely related to some mild hypo-tension during dialysis. - Seen by PT today and much improved and they recommend Home Health services.   2.  Abdominal distention-secondary to volume  overload and ascites. - Patient is status post therapeutic ultrasound-guided paracentesis with 4 L of fluid removed. Still feels quite distended and keeps belching. - may need to repeat Paracentesis in a.m.   3.  Diabetes type 2 without complication-continue sliding scale insulin. BS Stable.   4.  History of previous CVA-continue aspirin, warfarin.  5.  Hyperlipidemia-continue Crestor.  6.  GERD-continue Protonix.  7.  End-stage renal disease on hemodialysis-patient get dialysis on Monday, Wednesday, Friday. Pt. To get HD today.    8.  Diabetic neuropathy-continue gabapentin.  Will get Upper GI series tomorrow for intermitted dysphagia/belching.     All the records are reviewed and case discussed with Care Management/Social Worker. Management plans discussed with the patient, family and they are in agreement.  CODE STATUS: Full code  DVT Prophylaxis: Coumadin  TOTAL TIME TAKING CARE OF THIS PATIENT: 30 minutes.   POSSIBLE D/C IN 1-2 DAYS, DEPENDING ON CLINICAL CONDITION.   Henreitta Leber M.D on 11/27/2017 at 3:22 PM  Between 7am to 6pm - Pager - [phone]  After 6pm go to [email or web address] - Proofreader  [facility]  Office  [phone]  CC: Primary care physician; Elisabeth Cara, NP

## 2017-11-27 NOTE — Progress Notes (Signed)
Physical Therapy Evaluation Patient Details Name: Terry Tyler MRN: [id number] DOB: 1951-05-28 Today's Date: 11/27/2017   History of Present Illness  67yo male pt admitted to the hospital on 11/25/17 secondary to blurry vision and also RLE weakness (has been chronic but has gotten worse). 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 CT head, MRI/MRA of the brain is negative for acute pathology.  Patient carotid duplex shows no hemodynamically significant carotid stenosis. MRI of the lumbar spine also negative for acute spinal stenosis. Still having some RLE weakness. Vision has improved.    Clinical Impression  Pt admitted with above diagnosis. Pt currently with functional limitations due to the deficits listed below (see PT Problem List).  Pt is modified independent with bed mobility at this time using bed rail to assist himself to sitting at EOB. He requires CGA for transfers and ambulation. He is able to complete a full lap around RN station with rolling walker. Cues required for pt to remain within confines of walker during ambulation and repeated cues for upright posture. Vitals monitored and SaO2 remains at or above 95%. HR remains around 95-105 bpm. Pt requires multiple standing rest breaks due to fatigue and reports of back discomfort. Pt has a history of 3-4 falls/month per subjective report. He reports that his RLE weakness is chronic but with recent worsening. He is concerned regarding his lack of assistance at home but continues to tell therapist "I can take care of my needs just fine." He would benefit from continuation of [facility] PT after discharge and assist from family for ADLs/IADLs. He is at a high risk for future falls given his history of frequent falls. Encouraged pt to use a front-wheeled walker at all times when at home and out in community. Pt will benefit from PT services to address deficits in strength, balance, and  mobility in order to return to full function at home.     Follow Up Recommendations Home health PT;Supervision - Intermittent    Equipment Recommendations  None recommended by PT;Other (comment)(Pt must use two wheeled walker at discharge)    Recommendations for Other Services       Precautions / Restrictions Precautions Precautions: Fall Restrictions Weight Bearing Restrictions: No      Mobility  Bed Mobility Overal bed mobility: Modified Independent             General bed mobility comments: additional time/effort to perform but ultimately able to do without assist. Uses bed rails but HOB is flat  Transfers Overall transfer level: Needs assistance Equipment used: Rolling walker (2 wheeled) Transfers: Sit to/from Stand Sit to Stand: Min guard         General transfer comment: Pt able to stand from elevated bed without external assist from therapist. Increased time required to come to standing. Once standing pt is able to balance in wide stance without UE support with eyes open/closed. He is able to balance with feet together and eyes open but falls when closing his eyes  Ambulation/Gait Ambulation/Gait assistance: Min guard Gait Distance (Feet): 200 Feet Assistive device: Rolling walker (2 wheeled)   Gait velocity: Adequate for household mobility   General Gait Details: Pt is able to complete a full lap around RN station with rolling walker. Cues required for pt to remain within confines of walker during ambulation and repeated cues for upright posture. Vitals monitored and SaO2 remains at or above 95%. HR remains around  95-105 bpm. Pt requires multiple standing rest breaks due to fatigue and reports of back discomfort  Stairs            Wheelchair Mobility    Modified Rankin (Stroke Patients Only)       Balance Overall balance assessment: Needs assistance Sitting-balance support: No upper extremity supported Sitting balance-Leahy Scale: Good      Standing balance support: No upper extremity supported Standing balance-Leahy Scale: Fair Standing balance comment: with a widened stance, pt able to stand without hand support on RW but unable to accept challenge, cannot maintain balance when attempting marching in place, relying heavily on BUE support on RW                             Pertinent Vitals/Pain Pain Assessment: No/denies pain    Home Living Family/patient expects to be discharged to:: Unsure Living Arrangements: Spouse/significant other Available Help at Discharge: Family;Available PRN/intermittently Type of Home: Mobile home Home Access: Stairs to enter Entrance Stairs-Rails: Right;Left;Can reach both Entrance Stairs-Number of Steps: 5 Home Layout: One level Home Equipment: Walker - 2 wheels;Walker - 4 wheels;Cane - quad Additional Comments: Pt lives in mobile home w/ spouse (provides very little assist and works), 5 steps to enter with bilat rails, tub shower, and elevated toilet.    Prior Function Level of Independence: Needs assistance   Gait / Transfers Assistance Needed: Pt ambulates with 2WW in community, uses a little in the home, and needs assist for negotiating steps  ADL's / Homemaking Assistance Needed: Pt requires assist for sponge bathing, LB dressing, and all IADL including med mgt  Comments: When asked about falls in past 12 months, pt states, "Oh Lord, yes; I fall all the time." Pt attributes this to his RLE "giving out". Pt unable to get up after falls, requires assist. He reports at least 3-4 falls/month     Hand Dominance   Dominant Hand: Right    Extremity/Trunk Assessment   Upper Extremity Assessment Upper Extremity Assessment: Defer to OT evaluation    Lower Extremity Assessment Lower Extremity Assessment: RLE deficits/detail RLE Deficits / Details: grossly 2+/5 R hip flexion, 3+/5 L hip flexion, 4+ bilateral knee flexion/extension, 4/5 bilateral ankle dorsiflexion. RLE  pitting edema noted with strength testing. He reports RLE numbness but not tested. Denies LLE numbness    Cervical / Trunk Assessment Cervical / Trunk Assessment: Normal  Communication   Communication: No difficulties  Cognition Arousal/Alertness: Awake/alert Behavior During Therapy: WFL for tasks assessed/performed Overall Cognitive Status: Within Functional Limits for tasks assessed                                        General Comments General comments (skin integrity, edema, etc.): mild edema in BLE, worse distally    Exercises Other Exercises Other Exercises: Pt educated in falls prevention strategies and functional mobility with RW including hand placement to improve safety/independence   Assessment/Plan    PT Assessment Patient needs continued PT services  PT Problem List Decreased strength;Decreased activity tolerance;Decreased balance;Decreased mobility;Decreased safety awareness       PT Treatment Interventions Gait training;DME instruction;Functional mobility training;Stair training;Therapeutic activities;Therapeutic exercise;Balance training;Neuromuscular re-education;Patient/family education    PT Goals (Current goals can be found in the Care Plan section)  Acute Rehab PT Goals Patient Stated Goal: Decrease falls and improve RLE strength PT Goal  Formulation: With patient Time For Goal Achievement: 12/11/17 Potential to Achieve Goals: Fair    Frequency Min 2X/week   Barriers to discharge Decreased caregiver support Pt is alone during daytime hours    Co-evaluation               AM-PAC PT "6 Clicks" Daily Activity  Outcome Measure Difficulty turning over in bed (including adjusting bedclothes, sheets and blankets)?: A Little Difficulty moving from lying on back to sitting on the side of the bed? : A Little Difficulty sitting down on and standing up from a chair with arms (e.g., wheelchair, bedside commode, etc,.)?: A Little Help  needed moving to and from a bed to chair (including a wheelchair)?: A Little Help needed walking in hospital room?: A Little Help needed climbing 3-5 steps with a railing? : A Little 6 Click Score: 18    End of Session Equipment Utilized During Treatment: Gait belt Activity Tolerance: Patient tolerated treatment well Patient left: in bed;with call bell/phone within reach;with bed alarm set Nurse Communication: Mobility status PT Visit Diagnosis: Unsteadiness on feet (R26.81);Muscle weakness (generalized) (M62.81);Difficulty in walking, not elsewhere classified (R26.2)    Time: 2025-4270 PT Time Calculation (min) (ACUTE ONLY): 20 min   Charges:   PT Evaluation $PT Eval Low Complexity: 1 Low PT Treatments $Gait Training: 8-22 mins   PT G Codes:       Lyndel Safe Darcella Shiffman PT, DPT    Landri Dorsainvil 11/27/2017, 1:51 PM

## 2017-11-28 ENCOUNTER — Inpatient Hospital Stay: Payer: Medicare Other

## 2017-11-28 ENCOUNTER — Ambulatory Visit: Payer: Medicare HMO | Admitting: Podiatry

## 2017-11-28 DIAGNOSIS — I959 Hypotension, unspecified: Secondary | ICD-10-CM | POA: Diagnosis not present

## 2017-11-28 DIAGNOSIS — H538 Other visual disturbances: Secondary | ICD-10-CM | POA: Diagnosis not present

## 2017-11-28 LAB — GLUCOSE, CAPILLARY
GLUCOSE-CAPILLARY: 175 mg/dL — AB (ref 65–99)
GLUCOSE-CAPILLARY: 159 mg/dL — AB (ref 65–99)
GLUCOSE-CAPILLARY: 185 mg/dL — AB (ref 65–99)
GLUCOSE-CAPILLARY: 178 mg/dL — AB (ref 65–99)
GLUCOSE-CAPILLARY: 142 mg/dL — AB (ref 65–99)
Glucose-Capillary: 173 mg/dL — ABNORMAL HIGH (ref 65–99)

## 2017-11-28 LAB — ACID FAST SMEAR (AFB, MYCOBACTERIA): Acid Fast Smear: NEGATIVE

## 2017-11-28 LAB — PROTIME-INR
INR: 1.89
PROTHROMBIN TIME: 21.5 s — AB (ref 11.4–15.2)

## 2017-11-28 LAB — ACID FAST SMEAR (AFB)

## 2017-11-28 MED ORDER — WARFARIN SODIUM 6 MG PO TABS
6.0000 mg | ORAL_TABLET | Freq: Once | ORAL | Status: AC
Start: 2017-11-28 — End: 2017-11-28
  Administered 2017-11-28: 6 mg via ORAL
  Filled 2017-11-28: qty 1

## 2017-11-28 NOTE — Therapy (Signed)
  Speech Language Pathology  Patient Details Name: Terry Tyler MRN: [id number] DOB: [birth year] Today's Date: 11/28/2017  SLP contacted NSG and reviewed note from UGI. NSG stated that UGI showed no penetration/ aspiration and reflux was noted. MD addressing reflux. SLP educated ngs to notify st if changes occur post reflux precautions. SLP attempted to educate pt however he was out of room for procedure. NSG will notify slp if changes occur or if pt has further questions.    [place] Sauber 11/28/2017, 2:27 PM

## 2017-11-28 NOTE — Progress Notes (Signed)
[facility]  ROUNDING NOTE   Subjective:  Patient doing much better as compared to admission. He completed dialysis yesterday and tolerated well. Appears to have recurrent ascites.  Objective:  Vital signs in last 24 hours:  Temp:  [98.5 F (36.9 C)-99.4 F (37.4 C)] 99.4 F (37.4 C) (06/20 0411) Pulse Rate:  [80-97] 80 (06/20 1338) Resp:  [16-25] 18 (06/20 0411) BP: (95-147)/(60-84) 116/70 (06/20 1338) SpO2:  [94 %-100 %] 94 % (06/20 1338)  Weight change:  Filed Weights   11/25/17 1131  Weight: 90.7 kg (200 lb)    Intake/Output: I/O last 3 completed shifts: In: 240 [P.O.:240] Out: 2000 [Other:2000]   Intake/Output this shift:  No intake/output data recorded.  Physical Exam: General: No acute distress  Head: Normocephalic, atraumatic. Moist oral mucosal membranes  Eyes: Anicteric  Neck: Supple, trachea midline  Lungs:  Clear to auscultation, normal effort  Heart: S1S2 no rubs  Abdomen:  Soft, mild distension  Extremities: 2+ peripheral edema.  Neurologic: Awake, alert, following commands  Skin: No lesions  Access: LUE AVF    Basic Metabolic Panel: Recent Labs  Lab 11/25/17 1144 11/27/17 1646  NA 136  --   K 3.4*  --   CL 93*  --   CO2 27  --   GLUCOSE 143*  --   BUN 14  --   CREATININE 3.40*  --   CALCIUM 7.9*  --   PHOS  --  3.4    Liver Function Tests: Recent Labs  Lab 11/25/17 1144  AST 29  ALT 16*  ALKPHOS 285*  BILITOT 1.2  PROT 8.2*  ALBUMIN 2.7*   No results for input(s): LIPASE, AMYLASE in the last 168 hours. No results for input(s): AMMONIA in the last 168 hours.  CBC: Recent Labs  Lab 11/25/17 1144  WBC 8.8  HGB 10.4*  HCT 30.3*  MCV 77.7*  PLT 251    Cardiac Enzymes: No results for input(s): CKTOTAL, CKMB, CKMBINDEX, TROPONINI in the last 168 hours.  BNP: Invalid input(s): POCBNP  CBG: Recent Labs  Lab 11/27/17 0734 11/27/17 1156 11/28/17 0731 11/28/17 0937 11/28/17 1230  GLUCAP 112* 155*  175* 159* 173*    Microbiology: Results for orders placed or performed during the hospital encounter of 11/25/17  Body fluid culture     Status: None (Preliminary result)   Collection Time: 11/26/17 10:30 AM  Result Value Ref Range Status   Specimen Description   Final    PERITONEAL Performed at [facility], [address], [place], [place] [address]    Special Requests   Final    PERITONEAL Performed at [facility], [address] [place], [place] [address]    Gram Stain   Final    MODERATE WBC PRESENT,BOTH PMN AND MONONUCLEAR NO ORGANISMS SEEN    Culture   Final    NO GROWTH 2 DAYS Performed at [facility], [place] [address], [place], [place] [address]    Report Status PENDING  Incomplete    Coagulation Studies: Recent Labs    11/25/17 1952 11/26/17 0351 11/27/17 0552 11/28/17 0355  LABPROT 18.8* 20.0* 22.2* 21.5*  INR 1.59 1.72 1.96 1.89    Urinalysis: No results for input(s): COLORURINE, LABSPEC, PHURINE, GLUCOSEU, HGBUR, BILIRUBINUR, KETONESUR, PROTEINUR, UROBILINOGEN, NITRITE, LEUKOCYTESUR in the last 72 hours.  Invalid input(s): APPERANCEUR    Imaging: Dg Ugi  W/kub  Result Date: 11/28/2017 CLINICAL DATA:  Dysphagia, burping for 1 year EXAM: UPPER GI SERIES  WITH KUB TECHNIQUE: After obtaining a scout radiograph a routine upper GI series was performed using thin and high density barium. FLUOROSCOPY TIME:  Fluoroscopy Time:  1 minutes Radiation Exposure Index (if provided by the fluoroscopic device): 40.6 mGy Number of Acquired Spot Images: 1 COMPARISON:  None. FINDINGS: KUB: There is gaseous distention of small bowel and colon. There is no bowel dilatation to suggest obstruction. There is no evidence of pneumoperitoneum, portal venous gas or pneumatosis. There are no pathologic calcifications along the expected course of the ureters. The osseous structures are unremarkable. UPPER GI SERIES: Examination of the esophagus demonstrated  normal esophageal motility. Normal esophageal morphology without evidence of esophagitis or ulceration. No esophageal stricture, diverticula, or mass lesion. No evidence of hiatal hernia. There is no spontaneous or inducible gastroesophageal reflux. Limited evaluation of the stomach secondary to difficulty in patient positioning and poor gastric coating of barium. Examination of the stomach demonstrated normal rugal folds and areae gastricae. The gastric mucosa appeared unremarkable without evidence of ulceration, scarring, or mass lesion. Gastric motility and emptying was normal. Fluoroscopic examination of the duodenum demonstrates normal motility and morphology without evidence of ulceration or mass lesion. IMPRESSION: 1. Moderate gastroesophageal reflux. 2. Otherwise unremarkable upper GI. Electronically Signed   By: Kathreen Devoid   On: 11/28/2017 10:22     Medications:    . aspirin EC  81 mg Oral Daily  . gabapentin  300 mg Oral TID  . guaiFENesin  600 mg Oral BID  . hydrALAZINE  50 mg Oral BID  . insulin aspart  0-9 Units Subcutaneous TID WC  . lactobacillus  1 g Oral TID WC  . metoprolol tartrate  25 mg Oral QHS  . multivitamin  1 tablet Oral Daily  . pantoprazole  40 mg Oral BID AC  . rosuvastatin  20 mg Oral QHS  . warfarin  6 mg Oral ONCE-1800  . Warfarin - Pharmacist Dosing Inpatient   Does not apply q1800   acetaminophen **OR** acetaminophen (TYLENOL) oral liquid 160 mg/5 mL **OR** acetaminophen  Assessment/ Plan:  67 y.o. male end-stage renal disease, hemodialysis, hypertension, CABG, TIA, CVA, diabetes insulin-dependent, peripheral vascular disease, admitted post dialysis with abdominal distension.  MWF/[facility]/LUE AVF  1.  ESRD on HD Monday Wednesday Friday 2.  Ascites. 3.  Anemia of chronic kidney disease. 4.  Secondary hyperparathyroidism 5.  Hypertension.  Plan: Patient completed hemodialysis yesterday.  We will plan for dialysis again tomorrow  if the patient is still here.  He has recurrent ascites which hospitalist is addressing with paracentesis.  Most recent hemoglobin was 10.4.  Hold off on Epogen for now.  We will also continue to monitor serum phosphorus with next dialysis treatment.  Further plan as per hospitalist.     LOS: 3 Geneva Barrero 6/20/20191:52 PM

## 2017-11-28 NOTE — Progress Notes (Signed)
Occupational Therapy Treatment Patient Details Name: Terry Tyler MRN: [id number] DOB: 07/26/1950 Today's Date: 11/28/2017    History of present illness 67yo male pt admitted to the hospital on 11/25/17 secondary to blurry vision and also RLE weakness (has been chronic but has gotten worse). 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 CT head, MRI/MRA of the brain is negative for acute pathology.  Patient carotid duplex shows no hemodynamically significant carotid stenosis. MRI of the lumbar spine also negative for acute spinal stenosis. Still having some RLE weakness. Vision has improved.     OT comments  Met pt lying in bed, agreeable to OT this date. Pt noted with excessive belching throughout session, pt states this causes little discomfort, but he is "always full of so much air". New prevalon boot and wound dressing noted on LLE. Bed mobility completed with mod I A and increased time, pt states "I need help with my legs", but with increased time help was not needed to sit EOB. Bed <> BSC t/f completed with mod A and VC's for appropriate hand placement on BSC and no AD (using therapists BUEs to brace self) to pivot to [facility]. Pt able to pull down briefs with steadying assist from OT for balance. Pt not willing to let therapist remove prevalon boot for t/f, despite education. Appropriate safety measure taken to pivot. Once seated on BSC, pt pleasantly states "well come on let's do exercises while I'm on here". Shoulder exercises given to pt (see exercise section), left exercises on white board. Pt very motivated for UE exercise, stating he uses 5# weights at home to exercise BUEs. Pt stated he needed prolonged time to complete toileting, nurse tech called and present upon OT leaing to supervise pt in finishing task.   Follow Up Recommendations  Home health OT    Equipment Recommendations  3 in 1 bedside commode;Toilet rise with  handles;Tub/shower bench;Other (comment)(reacher)    Recommendations for Other Services      Precautions / Restrictions Precautions Precautions: Fall Restrictions Weight Bearing Restrictions: No Other Position/Activity Restrictions: pt with wound wrap and prevalon boot on LLE       Mobility Bed Mobility Overal bed mobility: Modified Independent             General bed mobility comments: pt initially reports "you're gonna have to swing my legs around" but did not need assist, only increased time  Transfers Overall transfer level: Needs assistance   Transfers: Sit to/from Stand Sit to Stand: Mod assist         General transfer comment: Mod A needed with VC's for correct hand placement to [facility]    Balance Overall balance assessment: Needs assistance Sitting-balance support: No upper extremity supported Sitting balance-Leahy Scale: Good Sitting balance - Comments: pt reports feeling off balanced, able to maintain upright sitting posture   Standing balance support: No upper extremity supported Standing balance-Leahy Scale: Fair Standing balance comment: pt needing steadying assist to stand with no UE support, braces self with therapist's arms                           ADL either performed or assessed with clinical judgement   ADL Overall ADL's : Needs assistance/impaired Eating/Feeding: Sitting;Independent   Grooming: Sitting;Independent   Upper Body Bathing: Sitting;Minimal assistance   Lower Body Bathing: Sit to/from stand;Moderate assistance   Upper Body Dressing : Sitting;Modified  independent   Lower Body Dressing: Sit to/from stand;Moderate assistance Lower Body Dressing Details (indicate cue type and reason): pt reports "my wife would wake me up early just to put my socks on, I don't do that for myself"   Toilet Transfer Details (indicate cue type and reason): BSC t/f completed with mod A, no RW Toileting- Clothing Manipulation and Hygiene:  Sitting/lateral lean;Independent Toileting - Clothing Manipulation Details (indicate cue type and reason): pt able to pull brief down independently with steadying assist from therapist for standing balance   Tub/Shower Transfer Details (indicate cue type and reason): pt sponge bathes at baseline Functional mobility during ADLs: Minimal assistance;Rolling walker       Vision Baseline Vision/History: Wears glasses Wears Glasses: Distance only Patient Visual Report: No change from baseline(had blurring intitially, improved)     Perception     Praxis      Cognition Arousal/Alertness: Awake/alert Behavior During Therapy: WFL for tasks assessed/performed Overall Cognitive Status: Within Functional Limits for tasks assessed                                          Exercises Other Exercises Other Exercises: Ceiling punches: 3 sets x10 Other Exercises: Shoulder abduction 3 sets, x10 Other Exercises: Lateral trunk exercises: 3 sets x10   Shoulder Instructions       General Comments LLE wound dressed and prevalon boot placed    Pertinent Vitals/ Pain       Pain Assessment: No/denies pain  Home Living                                          Prior Functioning/Environment              Frequency  Min 2X/week        Progress Toward Goals  OT Goals(current goals can now be found in the care plan section)  Progress towards OT goals: Progressing toward goals  Acute Rehab OT Goals Patient Stated Goal: to return home OT Goal Formulation: With patient Time For Goal Achievement: 12/11/17 Potential to Achieve Goals: Good  Plan Discharge plan needs to be updated    Co-evaluation                 AM-PAC PT "6 Clicks" Daily Activity     Outcome Measure   Help from another person eating meals?: None Help from another person taking care of personal grooming?: None Help from another person toileting, which includes using toliet,  bedpan, or urinal?: A Lot Help from another person bathing (including washing, rinsing, drying)?: A Lot Help from another person to put on and taking off regular upper body clothing?: A Little Help from another person to put on and taking off regular lower body clothing?: A Lot 6 Click Score: 17    End of Session Equipment Utilized During Treatment: Gait belt  OT Visit Diagnosis: Other abnormalities of gait and mobility (R26.89);Repeated falls (R29.6);Muscle weakness (generalized) (M62.81)   Activity Tolerance Patient tolerated treatment well   Patient Left Other (comment)(on commode chair finishing toileting with nurse tech present)   Nurse Communication Mobility status;Other (comment)(nurse tech with pt in room while pt finishing toileting)        Time: 2355-7322 OT Time Calculation (min): 18 min  Charges: OT General Charges $  OT Visit: 1 Visit OT Treatments $Self Care/Home Management : 8-22 mins  Zenovia Jarred, MSOT, OTR/L   Pe Ell 11/28/2017, 5:09 PM

## 2017-11-28 NOTE — Progress Notes (Signed)
[name]    MR#:  [id number]  DATE OF BIRTH:  1951-02-21  SUBJECTIVE:   Pt. Abdomen is quite distended.  Had upper GI series which showed just reflux.    REVIEW OF SYSTEMS:    Review of Systems  Constitutional: Negative for chills and fever.  HENT: Negative for congestion and tinnitus.   Eyes: Negative for blurred vision and double vision.  Respiratory: Negative for cough, shortness of breath and wheezing.   Cardiovascular: Negative for chest pain, orthopnea and PND.  Gastrointestinal: Negative for abdominal pain, diarrhea, nausea and vomiting.  Genitourinary: Negative for dysuria and hematuria.  Neurological: Positive for focal weakness (Right leg weakness). Negative for dizziness and sensory change.  All other systems reviewed and are negative.   Nutrition: renal/carb modified Tolerating Diet: Yes Tolerating PT: Eval noted.     DRUG ALLERGIES:   Allergies  Allergen Reactions  . Lisinopril Swelling  . Atorvastatin Calcium Other (See Comments)    Muscle cramps   . Ezetimibe Other (See Comments)    Cannot remember what the allergy was.  . Lipitor [Atorvastatin] Other (See Comments)    Muscle cramping    VITALS:  Blood pressure 118/67, pulse 80, temperature 99.4 F (37.4 C), temperature source Oral, resp. rate 18, height 6' (1.829 m), weight 90.7 kg (200 lb), SpO2 93 %.  PHYSICAL EXAMINATION:   Physical Exam  GENERAL:  67 y.o.-year-old patient lying in bed in no acute distress.  EYES: Pupils equal, round, reactive to light and accommodation. No scleral icterus. Extraocular muscles intact.  HEENT: Head atraumatic, normocephalic. Oropharynx and nasopharynx clear.  NECK:  Supple, no jugular venous distention. No thyroid enlargement, no tenderness.  LUNGS: Normal breath sounds bilaterally, no wheezing, rales, rhonchi. No use of accessory muscles of respiration.  CARDIOVASCULAR: S1, S2 normal.  No murmurs, rubs, or gallops.  ABDOMEN: Soft, nontender, distended with + fluid wave. Bowel sounds present. No organomegaly or mass.  EXTREMITIES: No cyanosis, clubbing or edema b/l.    NEUROLOGIC: Cranial nerves II through XII are intact.  Right lower extremity weakness 3 out of 5 strength. PSYCHIATRIC: The patient is alert and oriented x 3.  SKIN: No obvious rash, lesion, or ulcer.   Right upper extremity AV fistula with good bruit and thrill.  LABORATORY PANEL:   CBC Recent Labs  Lab 11/25/17 1144  WBC 8.8  HGB 10.4*  HCT 30.3*  PLT 251   ------------------------------------------------------------------------------------------------------------------  Chemistries  Recent Labs  Lab 11/25/17 1144  NA 136  K 3.4*  CL 93*  CO2 27  GLUCOSE 143*  BUN 14  CREATININE 3.40*  CALCIUM 7.9*  AST 29  ALT 16*  ALKPHOS 285*  BILITOT 1.2   ------------------------------------------------------------------------------------------------------------------  Cardiac Enzymes No results for input(s): TROPONINI in the last 168 hours. ------------------------------------------------------------------------------------------------------------------  RADIOLOGY:  Dg Ugi  W/kub  Result Date: 11/28/2017 CLINICAL DATA:  Dysphagia, burping for 1 year EXAM: UPPER GI SERIES WITH KUB TECHNIQUE: After obtaining a scout radiograph a routine upper GI series was performed using thin and high density barium. FLUOROSCOPY TIME:  Fluoroscopy Time:  1 minutes Radiation Exposure Index (if provided by the fluoroscopic device): 40.6 mGy Number of Acquired Spot Images: 1 COMPARISON:  None. FINDINGS: KUB: There is gaseous distention of small bowel and colon. There is no bowel dilatation to suggest obstruction. There is no evidence of pneumoperitoneum, portal venous gas or pneumatosis. There are no pathologic calcifications along the expected  course of the ureters. The osseous structures are unremarkable. UPPER GI  SERIES: Examination of the esophagus demonstrated normal esophageal motility. Normal esophageal morphology without evidence of esophagitis or ulceration. No esophageal stricture, diverticula, or mass lesion. No evidence of hiatal hernia. There is no spontaneous or inducible gastroesophageal reflux. Limited evaluation of the stomach secondary to difficulty in patient positioning and poor gastric coating of barium. Examination of the stomach demonstrated normal rugal folds and areae gastricae. The gastric mucosa appeared unremarkable without evidence of ulceration, scarring, or mass lesion. Gastric motility and emptying was normal. Fluoroscopic examination of the duodenum demonstrates normal motility and morphology without evidence of ulceration or mass lesion. IMPRESSION: 1. Moderate gastroesophageal reflux. 2. Otherwise unremarkable upper GI. Electronically Signed   By: Kathreen Devoid   On: 11/28/2017 10:22     ASSESSMENT AND PLAN:   67 year old male with past medical history of end-stage renal disease on hemodialysis, essential hypertension, hyperlipidemia, diabetes, history of previous CVA, peripheral vascular disease who presents to the hospital due to blurry vision, and difficulty moving his right lower extremity.  1.  Blurry vision/right lower extremity weakness- initially patient was admitted for suspicion of underlying CVA. -Patient's neurologic work-up although has been negative.  CT head, MRI/MRA of the brain is negative for acute pathology.  Patient carotid duplex shows no hemodynamically significant carotid stenosis. -Seen by neurology and they think this is likely related to some mild hypo-tension during dialysis. - Seen by PT today and much improved and will d/c with  Home Health services.   2.  Abdominal distention-secondary to volume overload and ascites. - Patient is status post therapeutic ultrasound-guided paracentesis with 4 L of fluid removed on 11/26/17 and will get another  Paracentesis today as pt. Is quite distended.   3.  Diabetes type 2 without complication-continue sliding scale insulin. BS Stable.   4.  History of previous CVA-continue aspirin, warfarin.  5.  Hyperlipidemia-continue Crestor.  6.  GERD-continue Protonix.  7.  End-stage renal disease on hemodialysis- patient get dialysis on Monday, Wednesday, Friday.  - Nephro following and cont. HD as per them.    8.  Diabetic neuropathy-continue gabapentin.  9. Dysphagia/belching - had upper GI series which was negative for acute pathology.  - cont. Protonix.     All the records are reviewed and case discussed with Care Management/Social Worker. Management plans discussed with the patient, family and they are in agreement.  CODE STATUS: Full code  DVT Prophylaxis: Coumadin  TOTAL TIME TAKING CARE OF THIS PATIENT: 30 minutes.   POSSIBLE D/C IN 1-2 DAYS, DEPENDING ON CLINICAL CONDITION.   Henreitta Leber M.D on 11/28/2017 at 2:44 PM  Between 7am to 6pm - Pager - [phone]  After 6pm go to [email or web address] - Proofreader  [facility]  Office  [phone]  CC: Primary care physician; Elisabeth Cara, NP

## 2017-11-28 NOTE — Progress Notes (Signed)
ANTICOAGULATION CONSULT NOTE - Initial Consult  Pharmacy Consult for  Warfarin  Indication: atrial fibrillation  Allergies  Allergen Reactions  . Lisinopril Swelling  . Atorvastatin Calcium Other (See Comments)    Muscle cramps   . Ezetimibe Other (See Comments)    Cannot remember what the allergy was.  . Lipitor [Atorvastatin] Other (See Comments)    Muscle cramping    Patient Measurements: Height: 6' (182.9 cm) Weight: 200 lb (90.7 kg) IBW/kg (Calculated) : 77.6 Heparin Dosing Weight:   Vital Signs: Temp: 99.4 F (37.4 C) (06/20 0411) Temp Source: Oral (06/20 0411) BP: 95/60 (06/20 0411) Pulse Rate: 93 (06/20 0411)  Labs: Recent Labs    11/25/17 1144  11/26/17 0351 11/27/17 0552 11/28/17 0355  HGB 10.4*  --   --   --   --   HCT 30.3*  --   --   --   --   PLT 251  --   --   --   --   LABPROT  --    < > 20.0* 22.2* 21.5*  INR  --    < > 1.72 1.96 1.89  CREATININE 3.40*  --   --   --   --    < > = values in this interval not displayed.    Estimated Creatinine Clearance: 24.1 mL/min (A) (by C-G formula based on SCr of 3.4 mg/dL (H)).   Medical History: Past Medical History:  Diagnosis Date  . C. difficile diarrhea   . CHF (congestive heart failure) ([place])   . CKD (chronic kidney disease)   . Coronary atherosclerosis of unspecified type of vessel, native or graft    Myocardial infarction in 2006. Cardiac catheterization showed significant three-vessel coronary artery disease. He underwent [facility]G at [facility]. Most recent cardiac catheterization in 2010 showed normal LV systolic function, patent grafts to OM1, RCA and LAD. Occluded SVG to diagonal.  . 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 Dialysis patient [facility])    Mon. Wed. Fri. for dialysis  . Hyperlipidemia   . Hypertension   . Hypertension, renal disease   . Impotence of organic origin   . Keloid scar   . Myocardial infarction ([place]) 2006  . Neoplasm of uncertain behavior, site  unspecified   . Peripheral vascular disease, unspecified ([place])   . Proteinuria   . Renal insufficiency   . Stroke [facility]) 04/2016   TIA  . Unspecified vitamin D deficiency     Medications:  Medications Prior to Admission  Medication Sig Dispense Refill Last Dose  . aspirin EC 81 MG EC tablet Take 1 tablet (81 mg total) by mouth daily. (Patient taking differently: Take 81 mg by mouth at bedtime. ) 180 tablet 0 11/03/2017 at 2000  . gabapentin (NEURONTIN) 300 MG capsule Take 300 mg by mouth 3 (three) times daily.   11/03/2017 at 2000  . hydrALAZINE (APRESOLINE) 50 MG tablet Take 50 mg by mouth 2 (two) times daily.    11/03/2017 at 2000  . lactobacillus (FLORANEX/LACTINEX) PACK Take 1 packet (1 g total) by mouth 3 (three) times daily with meals. (Patient taking differently: Take 1 g by mouth daily. ) 30 packet 0 11/03/2017 at 0800  . metoprolol tartrate (LOPRESSOR) 25 MG tablet Take 25 mg by mouth at bedtime.    11/03/2017 at 2000  . multivitamin (RENA-VIT) TABS tablet Take 1 tablet by mouth daily.  4 11/03/2017 at 0800  . pantoprazole (PROTONIX) 40 MG tablet Take  1 tablet (40 mg total) by mouth 2 (two) times daily before a meal. 60 tablet 1 11/04/2017 at 0600  . rosuvastatin (CRESTOR) 20 MG tablet Take 20 mg by mouth at bedtime.    11/03/2017 at 2000  . warfarin (COUMADIN) 5 MG tablet Take 1 tablet (5 mg total) by mouth at bedtime. 30 tablet 0 11/03/2017 at 2000  . guaiFENesin (MUCINEX) 600 MG 12 hr tablet Take 1 tablet (600 mg total) by mouth 2 (two) times daily. (Patient not taking: Reported on 11/04/2017) 30 tablet 0 Not Taking at Unknown time  . LOPERAMIDE A-D 2 MG tablet Take 1 tablet (2 mg total) by mouth 3 (three) times daily as needed for diarrhea or loose stools. (Patient not taking: Reported on 11/25/2017) 30 tablet 0 Not Taking at Unknown time  . mometasone-formoterol (DULERA) 200-5 MCG/ACT AERO Inhale 2 puffs into the lungs 2 (two) times daily. (Patient not taking: Reported on 11/04/2017) 1  Inhaler 0 Not Taking at Unknown time    Assessment: Pharmacy consulted to dose warfarin in this 67 year old male with Afib. Pt was on warfarin 5 mg PO daily at home. 6/17 :  INR = 1.59 ,  No warfarin given on 6/17 at time of INR draw  DATE INR DOSE 6/17 1.59 7.5mg  6/18 1.72 7.5mg  6/19 1.96 5mg  6/20 1.89   Goal of Therapy:  INR 2-3   Plan:  6/20 INR subtherapeutic at 1.89. Will give Warfarin 6mg  x 1 dose and F/U with AM INR.   Pernell Dupre, PharmD, BCPS Clinical Pharmacist 11/28/2017 7:18 AM

## 2017-11-28 NOTE — Consult Note (Signed)
[place] Nurse wound consult note Reason for Consult:blisters and denuded tissue (full thickness) to anterior left foot at digits 3-5 and interdigital skin loss. Wound type: infectious vs moisture associated skin damage Pressure Injury POA: NA Measurement: total area measures 5cm x 4cm x 0.1cm Wound bed: 80% red, 20% with light yellow slough Drainage (amount, consistency, odor) small amount light yellow exudate on old dressing Periwound: macerated Dressing procedure/placement/frequency:I will implement conservative topical care using an antimicrobial and astringent nonadherent dressing (xeroform gauze) with twice daily changes and protect the foot by placing in a Prevalon boot while patient is in bed.  Recommend consultation with podiatry.  If you agree, please order/arrange.  [facility] nursing team will not follow, but will remain available to this patient, the nursing and medical teams.  Please re-consult if needed. Thanks, Maudie Flakes, MSN, RN, [place], Arther Abbott  Pager# [phone]

## 2017-11-28 NOTE — Care Management Important Message (Signed)
Important Message  Patient Details  Name: Terry Tyler MRN: [id number] Date of Birth: Apr 23, 1951   Medicare Important Message Given:  Yes    Juliann Pulse A Stepfanie Yott 11/28/2017, 10:18 AM

## 2017-11-29 DIAGNOSIS — I959 Hypotension, unspecified: Secondary | ICD-10-CM | POA: Diagnosis not present

## 2017-11-29 DIAGNOSIS — H538 Other visual disturbances: Secondary | ICD-10-CM | POA: Diagnosis not present

## 2017-11-29 LAB — PROTIME-INR
INR: 2.16
Prothrombin Time: 23.9 seconds — ABNORMAL HIGH (ref 11.4–15.2)

## 2017-11-29 LAB — BODY FLUID CULTURE: CULTURE: NO GROWTH

## 2017-11-29 LAB — PHOSPHORUS: Phosphorus: 5.2 mg/dL — ABNORMAL HIGH (ref 2.5–4.6)

## 2017-11-29 LAB — GLUCOSE, CAPILLARY
GLUCOSE-CAPILLARY: 151 mg/dL — AB (ref 65–99)
Glucose-Capillary: 125 mg/dL — ABNORMAL HIGH (ref 65–99)

## 2017-11-29 MED ORDER — WARFARIN SODIUM 5 MG PO TABS
5.0000 mg | ORAL_TABLET | Freq: Once | ORAL | Status: DC
  Filled 2017-11-29: qty 1

## 2017-11-29 NOTE — Plan of Care (Signed)
  Problem: Education: Goal: Knowledge of disease or condition will improve Outcome: Progressing Goal: Knowledge of secondary prevention will improve Outcome: Progressing Goal: Knowledge of patient specific risk factors addressed and post discharge goals established will improve Outcome: Progressing   Problem: Coping: Goal: Will verbalize positive feelings about self Outcome: Progressing Goal: Will identify appropriate support needs Outcome: Progressing   Problem: Health Behavior/Discharge Planning: Goal: Ability to manage health-related needs will improve Outcome: Progressing   Problem: Self-Care: Goal: Ability to participate in self-care as condition permits will improve Outcome: Progressing Goal: Verbalization of feelings and concerns over difficulty with self-care will improve Outcome: Progressing   Problem: Nutrition: Goal: Risk of aspiration will decrease Outcome: Progressing   Problem: Ischemic Stroke/TIA Tissue Perfusion: Goal: Complications of ischemic stroke/TIA will be minimized Outcome: Progressing   Problem: Education: Goal: Knowledge of General Education information will improve Outcome: Progressing   Problem: Health Behavior/Discharge Planning: Goal: Ability to manage health-related needs will improve Outcome: Progressing   Problem: Clinical Measurements: Goal: Ability to maintain clinical measurements within normal limits will improve Outcome: Progressing Goal: Will remain free from infection Outcome: Progressing Goal: Diagnostic test results will improve Outcome: Progressing Goal: Cardiovascular complication will be avoided Outcome: Progressing   Problem: Activity: Goal: Risk for activity intolerance will decrease Outcome: Progressing   Problem: Nutrition: Goal: Adequate nutrition will be maintained Outcome: Progressing   Problem: Coping: Goal: Level of anxiety will decrease Outcome: Progressing   Problem: Elimination: Goal: Will not  experience complications related to bowel motility Outcome: Progressing Goal: Will not experience complications related to urinary retention Outcome: Progressing   Problem: Pain Managment: Goal: General experience of comfort will improve Outcome: Progressing   Problem: Safety: Goal: Ability to remain free from injury will improve Outcome: Progressing   Problem: Skin Integrity: Goal: Risk for impaired skin integrity will decrease Outcome: Progressing

## 2017-11-29 NOTE — Discharge Summary (Signed)
[place] [facility] [place] NAME: Terry Tyler    MR#:  [id number]  DATE OF BIRTH:  [birth year]  DATE OF ADMISSION:  11/25/2017 ADMITTING PHYSICIAN: Dustin Flock, MD  DATE OF DISCHARGE: 11/29/2017  PRIMARY CARE PHYSICIAN: Elisabeth Cara, NP    ADMISSION DIAGNOSIS:  CVA (cerebral vascular accident) ([place]) [I63.9] Other ascites [R18.8] Ascites [R18.8] Cerebrovascular accident (CVA), unspecified mechanism ([place]) [I63.9]  DISCHARGE DIAGNOSIS:  Active Problems:   Acute CVA (cerebrovascular accident) ([place])   SECONDARY DIAGNOSIS:   Past Medical History:  Diagnosis Date  . C. difficile diarrhea   . CHF (congestive heart failure) ([employer])   . CKD (chronic kidney disease)   . Coronary atherosclerosis of unspecified type of vessel, native or graft    Myocardial infarction in 2006. Cardiac catheterization showed significant three-vessel coronary artery disease. He underwent CABG at [facility]. Most recent cardiac catheterization in 2010 showed normal LV systolic function, patent grafts to OM1, RCA and LAD. Occluded SVG to diagonal.  . 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 Dialysis patient [facility])    Mon. Wed. Fri. for dialysis  . Hyperlipidemia   . Hypertension   . Hypertension, renal disease   . Impotence of organic origin   . Keloid scar   . Myocardial infarction ([place]) 2006  . Neoplasm of uncertain behavior, site unspecified   . Peripheral vascular disease, unspecified ([place])   . Proteinuria   . Renal insufficiency   . Stroke [facility]) 04/2016   TIA  . Unspecified vitamin D deficiency     HOSPITAL COURSE:   67 year old male with past medical history of end-stage renal disease on hemodialysis, essential hypertension, hyperlipidemia, diabetes, history of previous CVA, peripheral vascular disease who presents to the hospital due to blurry vision, and difficulty moving his right lower extremity.  1.  Blurry vision/right lower  extremity weakness- initially patient was admitted for suspicion of underlying CVA. -Patient's neurologic work-up although has been negative.  CT head, MRI/MRA of the brain were negative for acute pathology.  Patient carotid duplex shows no hemodynamically significant carotid stenosis. -Seen by neurology and they think this is likely related to some mild hypo-tension during dialysis. -Patient symptoms have significantly improved and after physical therapy evaluation they recommend home health services which is being arranged for him prior to discharge.  2.  Abdominal distention-secondary to volume overload and ascites.  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 - Patient underwent ultrasound-guided paracentesis twice while in the hospital.  He had a total of 7.6 L of ascitic fluid removed.  His abdominal distention is improved.  He may need repeat ultrasound-guided paracentesis done in the near future due to his severe cardiomyopathy.  This should be arranged through his primary care physician.  3.  DM Type II - pt. Was on SSI while in the hospital and will resume his Home Meds upon discharge.   4.  History of previous CVA- pt. Will continue aspirin, warfarin. - follow INR to a goal of 2.0.   5.  Hyperlipidemia- he will continue Crestor.  6.  GERD- he will continue Protonix.  7.  End-stage renal disease on hemodialysis- patient get dialysis on Monday, Wednesday, Friday.  -Nephrology was consulted and patient was maintained on his dialysis on a Monday Wednesday Friday schedule.  He has been dialyzed today prior to discharge.  8.  Diabetic neuropathy- he will continue  gabapentin.  9. Dysphagia/belching -seen by speech therapy and they thought this was asked secondary to severe reflux.  Patient did undergo a upper GI series which was suggestive of reflux but no anatomical obstruction.  Patient will continue  PPI.  Pt. Is being discharged with Home Health PT, OT, RN, Aide and family in agreement with discharge.      DISCHARGE CONDITIONS:   Stable.   CONSULTS OBTAINED:  Treatment Team:  Alexis Goodell, MD Anthonette Legato, MD  DRUG ALLERGIES:   Allergies  Allergen Reactions  . Lisinopril Swelling  . Atorvastatin Calcium Other (See Comments)    Muscle cramps   . Ezetimibe Other (See Comments)    Cannot remember what the allergy was.  . Lipitor [Atorvastatin] Other (See Comments)    Muscle cramping    DISCHARGE MEDICATIONS:   Allergies as of 11/29/2017      Reactions   Lisinopril Swelling   Atorvastatin Calcium Other (See Comments)   Muscle cramps   Ezetimibe Other (See Comments)   Cannot remember what the allergy was.   Lipitor [atorvastatin] Other (See Comments)   Muscle cramping      Medication List    STOP taking these medications   guaiFENesin 600 MG 12 hr tablet Commonly known as:  MUCINEX   hydrALAZINE 50 MG tablet Commonly known as:  APRESOLINE   LOPERAMIDE A-D 2 MG tablet Generic drug:  loperamide   mometasone-formoterol 200-5 MCG/ACT Aero Commonly known as:  DULERA     TAKE these medications   aspirin 81 MG EC tablet Take 1 tablet (81 mg total) by mouth daily. What changed:  when to take this   gabapentin 300 MG capsule Commonly known as:  NEURONTIN Take 300 mg by mouth 3 (three) times daily.   lactobacillus Pack Take 1 packet (1 g total) by mouth 3 (three) times daily with meals. What changed:  when to take this   metoprolol tartrate 25 MG tablet Commonly known as:  LOPRESSOR Take 25 mg by mouth at bedtime.   multivitamin Tabs tablet Take 1 tablet by mouth daily.   pantoprazole 40 MG tablet Commonly known as:  PROTONIX Take 1 tablet (40 mg total) by mouth 2 (two) times daily before a meal.   rosuvastatin 20 MG tablet Commonly known as:  CRESTOR Take 20 mg by mouth at bedtime.   warfarin 5 MG tablet Commonly known as:   COUMADIN Take 1 tablet (5 mg total) by mouth at bedtime.        DISCHARGE INSTRUCTIONS:   DIET:  Cardiac diet and Renal diet  DISCHARGE CONDITION:  Stable  ACTIVITY:  Activity as tolerated  OXYGEN:  Home Oxygen: No.   Oxygen Delivery: room air  DISCHARGE LOCATION:  Home with Home Health PT, OT, RN, Aide.    If you experience worsening of your admission symptoms, develop shortness of breath, life threatening emergency, suicidal or homicidal thoughts you must seek medical attention immediately by calling 911 or calling your MD immediately  if symptoms less severe.  You Must read complete instructions/literature along with all the possible adverse reactions/side effects for all the Medicines you take and that have been prescribed to you. Take any new Medicines after you have completely understood and accpet all the possible adverse reactions/side effects.   Please note  You were cared for by a hospitalist during your hospital stay. If you have any questions about your discharge medications or the care you received while you were in the hospital after you  are discharged, you can call the unit and asked to speak with the hospitalist on call if the hospitalist that took care of you is not available. Once you are discharged, your primary care physician will handle any further medical issues. Please note that NO REFILLS for any discharge medications will be authorized once you are discharged, as it is imperative that you return to your primary care physician (or establish a relationship with a primary care physician if you do not have one) for your aftercare needs so that they can reassess your need for medications and monitor your lab values.     Today   Abdominal distention has improved.  He has less dysphagia and belching after eating breakfast.  Had hemodialysis today and tolerated well.  Will discharge home with home health services.  VITAL SIGNS:  Blood pressure 117/66, pulse  80, temperature 98.4 F (36.9 C), resp. rate 15, height 6' (1.829 m), weight 90.7 kg (200 lb), SpO2 100 %.  I/O:    Intake/Output Summary (Last 24 hours) at 11/29/2017 1458 Last data filed at 11/29/2017 1415 Gross per 24 hour  Intake 560 ml  Output 1000 ml  Net -440 ml    PHYSICAL EXAMINATION:   GENERAL:  67 y.o.-year-old patient lying in bed in no acute distress.  EYES: Pupils equal, round, reactive to light and accommodation. No scleral icterus. Extraocular muscles intact.  HEENT: Head atraumatic, normocephalic. Oropharynx and nasopharynx clear.  NECK:  Supple, no jugular venous distention. No thyroid enlargement, no tenderness.  LUNGS: Normal breath sounds bilaterally, no wheezing, rales, rhonchi. No use of accessory muscles of respiration.  CARDIOVASCULAR: S1, S2 normal. No murmurs, rubs, or gallops.  ABDOMEN: Soft, nontender, slightly distended but much improved. Bowel sounds present. No organomegaly or mass.  EXTREMITIES: No cyanosis, clubbing or edema b/l.    NEUROLOGIC: Cranial nerves II through XII are intact.  Right lower extremity weakness 3 out of 5 strength. PSYCHIATRIC: The patient is alert and oriented x 3.  SKIN: No obvious rash, lesion, or ulcer.   Left upper extremity AV fistula with good bruit and thrill.   DATA REVIEW:   CBC Recent Labs  Lab 11/25/17 1144  WBC 8.8  HGB 10.4*  HCT 30.3*  PLT 251    Chemistries  Recent Labs  Lab 11/25/17 1144  NA 136  K 3.4*  CL 93*  CO2 27  GLUCOSE 143*  BUN 14  CREATININE 3.40*  CALCIUM 7.9*  AST 29  ALT 16*  ALKPHOS 285*  BILITOT 1.2    Cardiac Enzymes No results for input(s): TROPONINI in the last 168 hours.  Microbiology Results  Results for orders placed or performed during the hospital encounter of 11/25/17  Acid Fast Smear (AFB)     Status: None   Collection Time: 11/26/17 10:30 AM  Result Value Ref Range Status   AFB Specimen Processing Concentration  Final   Acid Fast Smear Negative   Final    Comment: (NOTE) Performed At: [facility] [place], [place] [id number] Rush Farmer MD HM:[id number]    Source (AFB) PERITONEAL  Final    Comment: Performed at [facility], [address] [place], [place] [address]  Body fluid culture     Status: None   Collection Time: 11/26/17 10:30 AM  Result Value Ref Range Status   Specimen Description   Final    PERITONEAL Performed at [facility], [address], [place], [place] [address]    Special Requests  Final    PERITONEAL Performed at [facility], [place], [place] [address]    Gram Stain   Final    MODERATE WBC PRESENT,BOTH PMN AND MONONUCLEAR NO ORGANISMS SEEN    Culture   Final    NO GROWTH 3 DAYS Performed at [facility], [place] [address], [place], [place] [address]    Report Status 11/29/2017 FINAL  Final    RADIOLOGY:  US Paracentesis  Result Date: 11/28/2017 CLINICAL DATA:  End-stage renal disease, residual/recurrent symptomatic abdominal ascites EXAM: ULTRASOUND GUIDED PARACENTESIS TECHNIQUE: The procedure, risks (including but not limited to bleeding, infection, organ damage ), benefits, and alternatives were explained to the patient. Questions regarding the procedure were encouraged and answered. The patient understands and consents to the procedure. Survey ultrasound of the abdomen was performed and an appropriate skin entry site in the left lower abdomen was selected. Skin site was marked, prepped with chlorhexadine, draped in usual sterile fashion, and infiltrated locally with 1% lidocaine. A Safe-T-Centesis needle was advanced into the peritoneal space until fluid could be aspirated. The sheath was advanced and the needle removed. 3.6 L of dark yellow ascites were aspirated. The patient tolerated the procedure well. COMPLICATIONS: COMPLICATIONS none IMPRESSION: Technically successful ultrasound guided paracentesis, removing 3.6 L  ascites. Electronically Signed   By: Lucrezia Europe M.D.   On: 11/28/2017 14:59   Dg Duanne Limerick  W/kub  Result Date: 11/28/2017 CLINICAL DATA:  Dysphagia, burping for 1 year EXAM: UPPER GI SERIES WITH KUB TECHNIQUE: After obtaining a scout radiograph a routine upper GI series was performed using thin and high density barium. FLUOROSCOPY TIME:  Fluoroscopy Time:  1 minutes Radiation Exposure Index (if provided by the fluoroscopic device): 40.6 mGy Number of Acquired Spot Images: 1 COMPARISON:  None. FINDINGS: KUB: There is gaseous distention of small bowel and colon. There is no bowel dilatation to suggest obstruction. There is no evidence of pneumoperitoneum, portal venous gas or pneumatosis. There are no pathologic calcifications along the expected course of the ureters. The osseous structures are unremarkable. UPPER GI SERIES: Examination of the esophagus demonstrated normal esophageal motility. Normal esophageal morphology without evidence of esophagitis or ulceration. No esophageal stricture, diverticula, or mass lesion. No evidence of hiatal hernia. There is no spontaneous or inducible gastroesophageal reflux. Limited evaluation of the stomach secondary to difficulty in patient positioning and poor gastric coating of barium. Examination of the stomach demonstrated normal rugal folds and areae gastricae. The gastric mucosa appeared unremarkable without evidence of ulceration, scarring, or mass lesion. Gastric motility and emptying was normal. Fluoroscopic examination of the duodenum demonstrates normal motility and morphology without evidence of ulceration or mass lesion. IMPRESSION: 1. Moderate gastroesophageal reflux. 2. Otherwise unremarkable upper GI. Electronically Signed   By: Kathreen Devoid   On: 11/28/2017 10:22      Management plans discussed with the patient, family and they are in agreement.  CODE STATUS:     Code Status Orders  (From admission, onward)        Start     Ordered   11/25/17 1653   Full code  Continuous     11/25/17 1652     TOTAL TIME TAKING CARE OF THIS PATIENT: 40 minutes.    Henreitta Leber M.D on 11/29/2017 at 2:58 PM  Between 7am to 6pm - Pager - [phone]  After 6pm go to [email or web address] - [employer] [facility]  Office  [phone]  CC: Primary care physician;  Elisabeth Cara, NP

## 2017-11-29 NOTE — Progress Notes (Signed)
Hd started  

## 2017-11-29 NOTE — Progress Notes (Signed)
ANTICOAGULATION CONSULT NOTE - Initial Consult  Pharmacy Consult for  Warfarin  Indication: atrial fibrillation  Allergies  Allergen Reactions  . Lisinopril Swelling  . Atorvastatin Calcium Other (See Comments)    Muscle cramps   . Ezetimibe Other (See Comments)    Cannot remember what the allergy was.  . Lipitor [Atorvastatin] Other (See Comments)    Muscle cramping    Patient Measurements: Height: 6' (182.9 cm) Weight: 200 lb (90.7 kg) IBW/kg (Calculated) : 77.6 Heparin Dosing Weight:   Vital Signs: Temp: 98.6 F (37 C) (06/21 0420) Temp Source: Oral (06/21 0420) BP: 112/67 (06/21 0420) Pulse Rate: 98 (06/21 0420)  Labs: Recent Labs    11/27/17 0552 11/28/17 0355 11/29/17 0538  LABPROT 22.2* 21.5* 23.9*  INR 1.96 1.89 2.16    Estimated Creatinine Clearance: 24.1 mL/min (A) (by C-G formula based on SCr of 3.4 mg/dL (H)).   Medical History: Past Medical History:  Diagnosis Date  . C. difficile diarrhea   . CHF (congestive heart failure) ([place])   . CKD (chronic kidney disease)   . Coronary atherosclerosis of unspecified type of vessel, native or graft    Myocardial infarction in 2006. Cardiac catheterization showed significant three-vessel coronary artery disease. He underwent CABG at [facility]. Most recent cardiac catheterization in 2010 showed normal LV systolic function, patent grafts to OM1, RCA and LAD. Occluded SVG to diagonal.  . 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 Dialysis patient [facility])    Mon. Wed. Fri. for dialysis  . Hyperlipidemia   . Hypertension   . Hypertension, renal disease   . Impotence of organic origin   . Keloid scar   . Myocardial infarction ([place]) 2006  . Neoplasm of uncertain behavior, site unspecified   . Peripheral vascular disease, unspecified ([place])   . Proteinuria   . Renal insufficiency   . Stroke [facility]) 04/2016   TIA  . Unspecified vitamin D deficiency     Assessment: Pharmacy consulted to dose  warfarin in this 67 year old male with Afib. Pt reports taking warfarin 5 mg PO daily at home. Outpatient records have patient 4mg  and 6mg  every other day.   6/17 :  INR = 1.59 ,  No warfarin given on 6/17 at time of INR draw  DATE INR DOSE 6/17 1.59 7.5mg  6/18 1.72 7.5mg  6/19 1.96 5mg  6/20 1.89 6mg  6/21 2.16   Goal of Therapy:  INR 2-3   Plan:  6/20 INR therapeutic this morning at 2.16. Will give Warfarin 5mg  x 1 dose and F/U with AM INR.   Pernell Dupre, PharmD, BCPS Clinical Pharmacist 11/29/2017 7:22 AM

## 2017-11-29 NOTE — Progress Notes (Signed)
[facility]  ROUNDING NOTE   Subjective:  Patient completed hemodialysis today. Overall patient feeling better as compared to admission.  Objective:  Vital signs in last 24 hours:  Temp:  [98.4 F (36.9 C)-98.9 F (37.2 C)] 98.4 F (36.9 C) (06/21 1415) Pulse Rate:  [65-98] 80 (06/21 1428) Resp:  [12-19] 15 (06/21 1428) BP: (96-137)/(56-81) 117/66 (06/21 1415) SpO2:  [97 %-100 %] 100 % (06/21 1330)  Weight change:  Filed Weights   11/25/17 1131  Weight: 90.7 kg (200 lb)    Intake/Output: I/O last 3 completed shifts: In: 200 [P.O.:200] Out: -    Intake/Output this shift:  Total I/O In: 360 [P.O.:360] Out: 1000 [Other:1000]  Physical Exam: General: No acute distress  Head: Normocephalic, atraumatic. Moist oral mucosal membranes  Eyes: Anicteric  Neck: Supple, trachea midline  Lungs:  Clear to auscultation, normal effort  Heart: S1S2 no rubs  Abdomen:  Soft, mild distension  Extremities: 1.  + peripheral edema.  Neurologic: Awake, alert, following commands  Skin: No lesions  Access: LUE AVF    Basic Metabolic Panel: Recent Labs  Lab 11/25/17 1144 11/27/17 1646 11/29/17 0538  NA 136  --   --   K 3.4*  --   --   CL 93*  --   --   CO2 27  --   --   GLUCOSE 143*  --   --   BUN 14  --   --   CREATININE 3.40*  --   --   CALCIUM 7.9*  --   --   PHOS  --  3.4 5.2*    Liver Function Tests: Recent Labs  Lab 11/25/17 1144  AST 29  ALT 16*  ALKPHOS 285*  BILITOT 1.2  PROT 8.2*  ALBUMIN 2.7*   No results for input(s): LIPASE, AMYLASE in the last 168 hours. No results for input(s): AMMONIA in the last 168 hours.  CBC: Recent Labs  Lab 11/25/17 1144  WBC 8.8  HGB 10.4*  HCT 30.3*  MCV 77.7*  PLT 251    Cardiac Enzymes: No results for input(s): CKTOTAL, CKMB, CKMBINDEX, TROPONINI in the last 168 hours.  BNP: Invalid input(s): POCBNP  CBG: Recent Labs  Lab 11/28/17 1230 11/28/17 1531 11/28/17 1709 11/28/17 2116  11/29/17 0741  GLUCAP 173* 185* 178* 142* 125*    Microbiology: Results for orders placed or performed during the hospital encounter of 11/25/17  Acid Fast Smear (AFB)     Status: None   Collection Time: 11/26/17 10:30 AM  Result Value Ref Range Status   AFB Specimen Processing Concentration  Final   Acid Fast Smear Negative  Final    Comment: (NOTE) Performed At: [facility] [address] [place], [place] [id number] Rush Farmer MD BJ:[id number]    Source (AFB) PERITONEAL  Final    Comment: Performed at [facility], [address] [place], [place] [address]  Body fluid culture     Status: None   Collection Time: 11/26/17 10:30 AM  Result Value Ref Range Status   Specimen Description   Final    PERITONEAL Performed at [facility], [address] [place], [place] [address]    Special Requests   Final    PERITONEAL Performed at [facility], [address] [place], [place] [address]    Gram Stain   Final    MODERATE WBC PRESENT,BOTH PMN AND MONONUCLEAR NO ORGANISMS SEEN    Culture   Final  NO GROWTH 3 DAYS Performed at [facility], [place] [address] [place], [place] [address]    Report Status 11/29/2017 FINAL  Final    Coagulation Studies: Recent Labs    11/27/17 0552 11/28/17 0355 11/29/17 0538  LABPROT 22.2* 21.5* 23.9*  INR 1.96 1.89 2.16    Urinalysis: No results for input(s): COLORURINE, LABSPEC, PHURINE, GLUCOSEU, HGBUR, BILIRUBINUR, KETONESUR, PROTEINUR, UROBILINOGEN, NITRITE, LEUKOCYTESUR in the last 72 hours.  Invalid input(s): APPERANCEUR    Imaging: US Paracentesis  Result Date: 11/28/2017 CLINICAL DATA:  End-stage renal disease, residual/recurrent symptomatic abdominal ascites EXAM: ULTRASOUND GUIDED PARACENTESIS TECHNIQUE: The procedure, risks (including but not limited to bleeding, infection, organ damage ), benefits, and alternatives were explained to the patient. Questions regarding the  procedure were encouraged and answered. The patient understands and consents to the procedure. Survey ultrasound of the abdomen was performed and an appropriate skin entry site in the left lower abdomen was selected. Skin site was marked, prepped with chlorhexadine, draped in usual sterile fashion, and infiltrated locally with 1% lidocaine. A Safe-T-Centesis needle was advanced into the peritoneal space until fluid could be aspirated. The sheath was advanced and the needle removed. 3.6 L of dark yellow ascites were aspirated. The patient tolerated the procedure well. COMPLICATIONS: COMPLICATIONS none IMPRESSION: Technically successful ultrasound guided paracentesis, removing 3.6 L ascites. Electronically Signed   By: Lucrezia Europe M.D.   On: 11/28/2017 14:59   Dg Duanne Limerick  W/kub  Result Date: 11/28/2017 CLINICAL DATA:  Dysphagia, burping for 1 year EXAM: UPPER GI SERIES WITH KUB TECHNIQUE: After obtaining a scout radiograph a routine upper GI series was performed using thin and high density barium. FLUOROSCOPY TIME:  Fluoroscopy Time:  1 minutes Radiation Exposure Index (if provided by the fluoroscopic device): 40.6 mGy Number of Acquired Spot Images: 1 COMPARISON:  None. FINDINGS: KUB: There is gaseous distention of small bowel and colon. There is no bowel dilatation to suggest obstruction. There is no evidence of pneumoperitoneum, portal venous gas or pneumatosis. There are no pathologic calcifications along the expected course of the ureters. The osseous structures are unremarkable. UPPER GI SERIES: Examination of the esophagus demonstrated normal esophageal motility. Normal esophageal morphology without evidence of esophagitis or ulceration. No esophageal stricture, diverticula, or mass lesion. No evidence of hiatal hernia. There is no spontaneous or inducible gastroesophageal reflux. Limited evaluation of the stomach secondary to difficulty in patient positioning and poor gastric coating of barium. Examination of  the stomach demonstrated normal rugal folds and areae gastricae. The gastric mucosa appeared unremarkable without evidence of ulceration, scarring, or mass lesion. Gastric motility and emptying was normal. Fluoroscopic examination of the duodenum demonstrates normal motility and morphology without evidence of ulceration or mass lesion. IMPRESSION: 1. Moderate gastroesophageal reflux. 2. Otherwise unremarkable upper GI. Electronically Signed   By: Kathreen Devoid   On: 11/28/2017 10:22     Medications:    . aspirin EC  81 mg Oral Daily  . gabapentin  300 mg Oral TID  . guaiFENesin  600 mg Oral BID  . hydrALAZINE  50 mg Oral BID  . insulin aspart  0-9 Units Subcutaneous TID WC  . lactobacillus  1 g Oral TID WC  . metoprolol tartrate  25 mg Oral QHS  . multivitamin  1 tablet Oral Daily  . pantoprazole  40 mg Oral BID AC  . rosuvastatin  20 mg Oral QHS  . warfarin  5 mg Oral ONCE-1800  . Warfarin - Pharmacist Dosing Inpatient  Does not apply q1800   acetaminophen **OR** acetaminophen (TYLENOL) oral liquid 160 mg/5 mL **OR** acetaminophen  Assessment/ Plan:  67 y.o. male end-stage renal disease, hemodialysis, hypertension, CABG, TIA, CVA, diabetes insulin-dependent, peripheral vascular disease, admitted post dialysis with abdominal distension.  MWF/[facility]/LUE AVF  1.  ESRD on HD Monday Wednesday Friday 2.  Ascites. 3.  Anemia of chronic kidney disease. 4.  Secondary hyperparathyroidism 5.  Hypertension.  Plan: Patient did complete hemodialysis today and tolerated well.  Ultrafiltration achieved was 1 kg.  His next scheduled dialysis will be on Monday as an outpatient.  His ascites was treated with paracentesis this admission.  He may need periodic paracentesis as an outpatient.  Recommend CBC continue to be monitored as an outpatient.  Serum phosphorus currently under reasonable control.  Additional plan as per hospitalist.    LOS: 4 Brandi Tomlinson,  Vitoria Conyer 6/21/20193:37 PM

## 2017-11-29 NOTE — Care Management (Signed)
Patient admitted from home with sx concerning for CVA.Chronic HD and Terry Tyler with [facility] informed of discharge.  Have reached out to attending for order for RN PT and OT.  Patient currently open to [facility] but services unknown.  Notified [facility] of discharge. No DME needed.

## 2017-11-29 NOTE — Progress Notes (Signed)
This note also relates to the following rows which could not be included: Pulse Rate - Cannot attach notes to unvalidated device data Resp - Cannot attach notes to unvalidated device data BP - Cannot attach notes to unvalidated device data  Hd completed  

## 2017-11-29 NOTE — Progress Notes (Signed)
Discharge instructions given and went over with patient at bedside. Patient discharged home with daughter via wheelchair by nursing staff. Madlyn Frankel, RN

## 2017-11-30 LAB — LIPASE, FLUID: LIPASE-FLUID: 12 U/L

## 2018-01-06 ENCOUNTER — Ambulatory Visit: Payer: Medicare HMO | Admitting: Podiatry

## 2018-01-06 ENCOUNTER — Encounter: Payer: Self-pay | Admitting: Podiatry

## 2018-01-06 DIAGNOSIS — E1142 Type 2 diabetes mellitus with diabetic polyneuropathy: Secondary | ICD-10-CM

## 2018-01-06 DIAGNOSIS — L97422 Non-pressure chronic ulcer of left heel and midfoot with fat layer exposed: Secondary | ICD-10-CM

## 2018-01-06 DIAGNOSIS — I739 Peripheral vascular disease, unspecified: Secondary | ICD-10-CM

## 2018-01-06 NOTE — Progress Notes (Signed)
This patient presents to the office for continued evaluation and treatment of an ulcer that has developed over the left forefoot.  He was seen in this office for nail care 6 weeks ago and had an ulcer/blister formation noted on the third and fourth digits, left foot.  There is also more black discoloration noted over the forefoot  left foot.  He was told to return to the office in one week for continued evaluation of ulcer. He says over this weekend he developed shooting pain through his left foot.  He says he had pain during the day as well as nighttime.  He also says that he is developing a dark fourth toe left foot.  Patient was not given antibiotics since he previously had C. Difficile.  Patient also has a history of poor circulation, which required surgery and a stent was put in by Dr. Lucky Cowboy.  This patient is also  on  Dialysis for his kidneys.  Patient is taking coumadin. He presents to the office with his wife and caretaker.  General Appearance  Alert, conversant and in no acute stress.  Vascular  Dorsalis pedis and posterior tibial  pulses are not  palpable  bilaterally.  Capillary return is within normal limits  Bilaterally  Cold foot right foot.  Temperature WNL left foot. Fourth digit left foot is dark and dusky.  Neurologic  Senn-Weinstein monofilament wire test within normal limits  bilaterally. Muscle power within normal limits bilaterally.  Nails Thick disfigured discolored nails with subungual debris  from hallux to fifth toes bilaterally. No evidence of bacterial infection or drainage bilaterally.  Orthopedic  No limitations of motion of motion feet .  No crepitus or effusions noted.  No bony pathology or digital deformities noted. Significant swelling feet  B/l.  Skin  Examination of his left forefoot reveals healing noted to the fourth and third digits, left foot. Compared to his visit 6 weeks ago.  He has developed dry ulcer noted over the third and fourth metatarsal left foot.  No  signs of redness, swelling or drainage.  Black discoloration noted on the dorsum of the left foot  Ulcer left foot dorsally due to vascular disease.    ROV.  Examined his left foot to determine his toes have healed from the previous the ulcer on the left forefoot has worsened.  Since this is been not healing properly for the last 6 weeks, I recommended an evaluation with his vascular doctor, Dr. Lucky Cowboy.  This appointment was made and marked as urgent.  RTC prn preventative foot care services.   Gardiner Barefoot DPM

## 2018-01-10 ENCOUNTER — Emergency Department
Admission: EM | Admit: 2018-01-10 | Discharge: 2018-01-10 | Disposition: A | Discharge status: Home / Self Care | Payer: Medicare HMO | Source: Home / Self Care | Attending: Emergency Medicine | Admitting: Emergency Medicine

## 2018-01-10 ENCOUNTER — Emergency Department: Payer: Medicare HMO

## 2018-01-10 ENCOUNTER — Other Ambulatory Visit: Payer: Self-pay

## 2018-01-10 ENCOUNTER — Encounter (INDEPENDENT_AMBULATORY_CARE_PROVIDER_SITE_OTHER): Payer: Self-pay

## 2018-01-10 DIAGNOSIS — I132 Hypertensive heart and chronic kidney disease with heart failure and with stage 5 chronic kidney disease, or end stage renal disease: Secondary | ICD-10-CM | POA: Insufficient documentation

## 2018-01-10 DIAGNOSIS — Z7901 Long term (current) use of anticoagulants: Secondary | ICD-10-CM

## 2018-01-10 DIAGNOSIS — K529 Noninfective gastroenteritis and colitis, unspecified: Secondary | ICD-10-CM | POA: Diagnosis not present

## 2018-01-10 DIAGNOSIS — I739 Peripheral vascular disease, unspecified: Secondary | ICD-10-CM

## 2018-01-10 DIAGNOSIS — Z87891 Personal history of nicotine dependence: Secondary | ICD-10-CM | POA: Insufficient documentation

## 2018-01-10 DIAGNOSIS — N186 End stage renal disease: Secondary | ICD-10-CM | POA: Insufficient documentation

## 2018-01-10 DIAGNOSIS — I959 Hypotension, unspecified: Secondary | ICD-10-CM | POA: Insufficient documentation

## 2018-01-10 DIAGNOSIS — E119 Type 2 diabetes mellitus without complications: Secondary | ICD-10-CM

## 2018-01-10 DIAGNOSIS — I509 Heart failure, unspecified: Secondary | ICD-10-CM | POA: Insufficient documentation

## 2018-01-10 DIAGNOSIS — Z7982 Long term (current) use of aspirin: Secondary | ICD-10-CM | POA: Insufficient documentation

## 2018-01-10 DIAGNOSIS — R112 Nausea with vomiting, unspecified: Secondary | ICD-10-CM | POA: Diagnosis not present

## 2018-01-10 DIAGNOSIS — I2510 Atherosclerotic heart disease of native coronary artery without angina pectoris: Secondary | ICD-10-CM

## 2018-01-10 LAB — COMPREHENSIVE METABOLIC PANEL
ALK PHOS: 294 U/L — AB (ref 38–126)
ALT: 26 U/L (ref 0–44)
ANION GAP: 12 (ref 5–15)
AST: 47 U/L — ABNORMAL HIGH (ref 15–41)
Albumin: 2.8 g/dL — ABNORMAL LOW (ref 3.5–5.0)
BILIRUBIN TOTAL: 2.5 mg/dL — AB (ref 0.3–1.2)
BUN: 30 mg/dL — ABNORMAL HIGH (ref 8–23)
CALCIUM: 8.3 mg/dL — AB (ref 8.9–10.3)
CO2: 29 mmol/L (ref 22–32)
CREATININE: 5.85 mg/dL — AB (ref 0.61–1.24)
Chloride: 98 mmol/L (ref 98–111)
GFR calc Af Amer: 11 mL/min — ABNORMAL LOW (ref >60)
GFR calc non Af Amer: 9 mL/min — ABNORMAL LOW (ref >60)
GLUCOSE: 194 mg/dL — AB (ref 70–99)
Potassium: 4.7 mmol/L (ref 3.5–5.1)
Sodium: 139 mmol/L (ref 135–145)
Total Protein: 7.9 g/dL (ref 6.5–8.1)

## 2018-01-10 LAB — CBC
HCT: 28.7 % — ABNORMAL LOW (ref 40.0–52.0)
HEMOGLOBIN: 9.5 g/dL — AB (ref 13.0–18.0)
MCH: 26.6 pg (ref 26.0–34.0)
MCHC: 32.9 g/dL (ref 32.0–36.0)
MCV: 80.8 fL (ref 80.0–100.0)
Platelets: 178 10*3/uL (ref 150–440)
RBC: 3.56 MIL/uL — ABNORMAL LOW (ref 4.40–5.90)
RDW: 18.1 % — ABNORMAL HIGH (ref 11.5–14.5)
WBC: 11.3 10*3/uL — ABNORMAL HIGH (ref 3.8–10.6)

## 2018-01-10 LAB — ACID FAST CULTURE WITH REFLEXED SENSITIVITIES (MYCOBACTERIA): Acid Fast Culture: NEGATIVE

## 2018-01-10 LAB — TROPONIN I: Troponin I: 0.03 ng/mL (ref <0.03)

## 2018-01-10 MED ORDER — SODIUM CHLORIDE 0.9 % IV BOLUS
500.0000 mL | Freq: Once | INTRAVENOUS | Status: AC
  Administered 2018-01-10: 500 mL via INTRAVENOUS

## 2018-01-10 NOTE — ED Provider Notes (Signed)
[facility] Emergency Department Provider Note ____________________________________________   None    (approximate)  I have reviewed the triage vital signs and the nursing notes.   HISTORY  Chief Complaint Dizziness and Foot Pain    HPI Terry Tyler is a 67 y.o. male who presents with dizziness since last night, described as generalized lightheadedness, worse this morning when he awoke, and associated with low blood pressure.  The patient states that he went to his dialysis, but his diastolic blood pressure was noted to be in the 20s and so he did not proceed with dialysis.  The patient states that he is feeling somewhat better now.  He denies any fever or chills, vomiting, or shortness of breath.  The patient also reports discoloration to the left third toe.  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  He was seen by podiatry and has been referred to see vascular surgery next week.  However, he states that he has been having shooting pains going up the left leg from the toes, increased numbness, and now the third toe appears somewhat discolored.  Past Medical History:  Diagnosis Date  . C. difficile diarrhea   . CHF (congestive heart failure) ([place])   . CKD (chronic kidney disease)   . Coronary atherosclerosis of unspecified type of vessel, native or graft    Myocardial infarction in 2006. Cardiac catheterization showed significant three-vessel coronary artery disease. He underwent CABG at [facility]. Most recent cardiac catheterization in 2010 showed normal LV systolic function, patent grafts to OM1, RCA and LAD. Occluded SVG to diagonal.  . 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 Dialysis patient [facility])    Mon. Wed. Fri. for dialysis  . Hyperlipidemia   . Hypertension   . Hypertension, renal disease   . Impotence of organic origin   . Keloid  scar   . Myocardial infarction ([place]) 2006  . Neoplasm of uncertain behavior, site unspecified   . Peripheral vascular disease, unspecified ([place])   . Proteinuria   . Renal insufficiency   . Stroke [facility]) 04/2016   TIA  . Unspecified vitamin D deficiency     Patient Active Problem List   Diagnosis Date Noted  . Acute CVA (cerebrovascular accident) ([place]) 11/25/2017  . Pneumonia 10/12/2017  . HCAP (healthcare-associated pneumonia) 10/11/2017  . CHF (congestive heart failure) ([place]) 07/16/2017  . GI bleed 05/07/2017  . 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 Hyperbilirubinemia 04/10/2017  . Diarrhea 02/20/2017  . C. difficile diarrhea 02/19/2017  . Diarrhea of presumed infectious origin 11/01/2016  . ESRD on dialysis ([place]) 10/16/2016  . Fatigue 10/02/2016  . Leukocytosis 10/02/2016  . Nausea & vomiting 10/02/2016  . Inferior myocardial infarction ([place]) 09/18/2016  . Elevated troponin 09/08/2016  . CVA (cerebral vascular accident) ([place]) 04/09/2016  . Hyperphosphatemia 08/09/2015  . Secondary hyperparathyroidism ([place]) 08/09/2015  . Chronic kidney disease (CKD) stage G4/A1, severely decreased glomerular filtration rate (GFR) between 15-29 mL/min/1.73 square meter and albuminuria creatinine ratio less than 30 mg/g (HCC) 05/31/2015  . Bilateral carotid artery stenosis 07/24/2014  . Hypertensive heart disease without heart failure 07/24/2014  . Nonrheumatic mitral valve insufficiency 07/24/2014  . Stenosis of carotid artery 07/24/2014  . Anemia 03/30/2013  . Vitamin D deficiency 03/30/2013  . Obesity 08/28/2012  . PAD (peripheral artery disease) ([place]) 04/10/2012  . CAD (  coronary artery disease)   . Hyperlipidemia   . Hypertension   . Proteinuria 06/19/2010  . Type II diabetes mellitus ([place]) 06/19/2010    Past Surgical History:  Procedure Laterality Date  . A/V FISTULAGRAM Left 01/03/2017   Procedure: A/V Fistulagram;  Surgeon: Algernon Huxley, MD;  Location: [place]  CV LAB;  Service: Cardiovascular;  Laterality: Left;  . A/V FISTULAGRAM Left 06/06/2017   Procedure: A/V FISTULAGRAM;  Surgeon: Algernon Huxley, MD;  Location: [place] CV LAB;  Service: Cardiovascular;  Laterality: Left;  . A/V FISTULAGRAM Left 09/02/2017   Procedure: A/V FISTULAGRAM;  Surgeon: Algernon Huxley, MD;  Location: [place] CV LAB;  Service: Cardiovascular;  Laterality: Left;  . A/V SHUNT INTERVENTION N/A 01/03/2017   Procedure: A/V Shunt Intervention;  Surgeon: Algernon Huxley, MD;  Location: [place] CV LAB;  Service: Cardiovascular;  Laterality: N/A;  . A/V SHUNT INTERVENTION N/A 06/06/2017   Procedure: A/V SHUNT INTERVENTION;  Surgeon: Algernon Huxley, MD;  Location: [place] CV LAB;  Service: Cardiovascular;  Laterality: N/A;  . AV FISTULA PLACEMENT Left 10/25/2016   Procedure: ARTERIOVENOUS (AV) FISTULA CREATION ( RADIOCEPHALIC );  Surgeon: Algernon Huxley, MD;  Location: [facility] ORS;  Service: Vascular;  Laterality: Left;  . CARDIAC CATHETERIZATION  12/2004   [facility]  . CARDIAC CATHETERIZATION  09/2008   [facility]  . COLONOSCOPY WITH PROPOFOL N/A 05/09/2017   Procedure: COLONOSCOPY WITH PROPOFOL;  Surgeon: Toledo, Benay Pike, MD;  Location: [facility] ENDOSCOPY;  Service: Gastroenterology;  Laterality: N/A;  . CORONARY ANGIOPLASTY    . CORONARY ARTERY BYPASS GRAFT  2006   [facility]  . CORONARY STENT INTERVENTION N/A 09/10/2016   Procedure: Coronary Stent Intervention;  Surgeon: Isaias Cowman, MD;  Location: [place] CV LAB;  Service: Cardiovascular;  Laterality: N/A;  . DIALYSIS/PERMA CATHETER REMOVAL N/A 02/14/2017   Procedure: DIALYSIS/PERMA CATHETER REMOVAL;  Surgeon: Algernon Huxley, MD;  Location: [place] CV LAB;  Service: Cardiovascular;  Laterality: N/A;  . ESOPHAGOGASTRODUODENOSCOPY (EGD) WITH PROPOFOL N/A 05/08/2017   Procedure: ESOPHAGOGASTRODUODENOSCOPY (EGD) WITH PROPOFOL;  Surgeon: Toledo, Benay Pike, MD;  Location: [facility] ENDOSCOPY;  Service:  Gastroenterology;  Laterality: N/A;  . keloid removal     right neck  . LEFT HEART CATH AND CORONARY ANGIOGRAPHY N/A 09/10/2016   Procedure: Left Heart Cath and Coronary Angiography;  Surgeon: Isaias Cowman, MD;  Location: [place] CV LAB;  Service: Cardiovascular;  Laterality: N/A;    Prior to Admission medications   Medication Sig Start Date End Date Taking? Authorizing Provider  aspirin EC 81 MG EC tablet Take 1 tablet (81 mg total) by mouth daily. Patient taking differently: Take 81 mg by mouth at bedtime.  07/22/17   Salary, Avel Peace, MD  gabapentin (NEURONTIN) 300 MG capsule Take 300 mg by mouth 3 (three) times daily.    [provider]  lactobacillus (FLORANEX/LACTINEX) PACK Take 1 packet (1 g total) by mouth 3 (three) times daily with meals. Patient taking differently: Take 1 g by mouth daily.  07/22/17   Salary, Avel Peace, MD  metoprolol tartrate (LOPRESSOR) 25 MG tablet Take 25 mg by mouth at bedtime.     [provider]  multivitamin (RENA-VIT) TABS tablet Take 1 tablet by mouth daily. 08/19/17   [provider]  pantoprazole (PROTONIX) 40 MG tablet Take 1 tablet (40 mg total) by mouth 2 (two) times daily before a meal. 05/10/17   Gladstone Lighter, MD  rosuvastatin (CRESTOR) 20 MG tablet Take  20 mg by mouth at bedtime.     [provider]  warfarin (COUMADIN) 5 MG tablet Take 1 tablet (5 mg total) by mouth at bedtime. 10/13/17   Gladstone Lighter, MD    Allergies Lisinopril; Atorvastatin calcium; Ezetimibe; and Lipitor [atorvastatin]  Family History  Problem Relation Age of Onset  . Hypertension Mother   . Hyperlipidemia Mother   . Heart disease Mother   . Heart disease Father   . Hyperlipidemia Father   . Hypertension Father   . Kidney disease Sister   . Diabetes Sister   . Hypertension Sister   . Diabetes Brother   . Hypertension Brother   . HIV/AIDS Brother     Social History Social History   Tobacco Use  . Smoking  status: Former Smoker    Packs/day: 0.50    Years: 0.00    Pack years: 0.00    Types: Cigarettes    Last attempt to quit: 06/11/1980    Years since quitting: 37.6  . Smokeless tobacco: Never Used  Substance Use Topics  . Alcohol use: No    Comment: socially  . Drug use: No    Review of Systems  Constitutional: No fever.  Positive for lightheadedness. Eyes: No redness. ENT: No sore throat. Cardiovascular: Denies chest pain. Respiratory: Denies shortness of breath. Gastrointestinal: No vomiting. Genitourinary: Negative for dysuria.  Musculoskeletal: Positive for left leg pain. Skin: Negative for rash. Neurological: Negative for headache.   ____________________________________________   PHYSICAL EXAM:  VITAL SIGNS: ED Triage Vitals  Enc Vitals Group     BP 01/10/18 0738 103/67     Pulse Rate 01/10/18 0738 76     Resp 01/10/18 0738 17     Temp 01/10/18 0738 98 F (36.7 C)     Temp Source 01/10/18 0738 Oral     SpO2 01/10/18 0738 98 %     Weight 01/10/18 0735 205 lb (93 kg)     Height 01/10/18 0735 6' (1.829 m)     Head Circumference --      Peak Flow --      Pain Score 01/10/18 0735 0     Pain Loc --      Pain Edu? --      Excl. in [place]? --     Constitutional: Alert and oriented.  Relatively well appearing and in no acute distress. Eyes: Conjunctivae are normal.  Head: Atraumatic. Nose: No congestion/rhinnorhea. Mouth/Throat: Mucous membranes are somewhat dry.   Neck: Normal range of motion.  Cardiovascular: Normal rate, regular rhythm. Grossly normal heart sounds.  Good peripheral circulation. Respiratory: Normal respiratory effort.  No retractions. Lungs CTAB. Gastrointestinal: No distention.  Musculoskeletal: No lower extremity edema.  Absent DP pulses to bilateral lower extremities.  Left foot slightly cooler than right.  Subacute appearing dry ulceration to dorsum of left foot.  Left third and fourth digits appear somewhat darker with decreased sensation.   Cap refill <2 seconds.  No erythema, induration, or rash. Neurologic:  Normal speech and language. No gross focal neurologic deficits are appreciated.  Skin:  Skin is warm and dry. No rash noted. Psychiatric: Mood and affect are normal. Speech and behavior are normal.  ____________________________________________   LABS (all labs ordered are listed, but only abnormal results are displayed)  Labs Reviewed  CBC - Abnormal; Notable for the following components:      Result Value   WBC 11.3 (*)    RBC 3.56 (*)    Hemoglobin 9.5 (*)  HCT 28.7 (*)    RDW 18.1 (*)    All other components within normal limits  COMPREHENSIVE METABOLIC PANEL - Abnormal; Notable for the following components:   Glucose, Bld 194 (*)    BUN 30 (*)    Creatinine, Ser 5.85 (*)    Calcium 8.3 (*)    Albumin 2.8 (*)    AST 47 (*)    Alkaline Phosphatase 294 (*)    Total Bilirubin 2.5 (*)    GFR calc non Af Amer 9 (*)    GFR calc Af Amer 11 (*)    All other components within normal limits  TROPONIN I - Abnormal; Notable for the following components:   Troponin I 0.03 (*)    All other components within normal limits   ____________________________________________  EKG  ED ECG REPORT I, Arta Silence, the attending physician, personally viewed and interpreted this ECG.  Date: 01/10/2018 EKG Time: 0737 Rate: 76 Rhythm: normal sinus rhythm QRS Axis: normal Intervals: normal ST/T Wave abnormalities: Repolarization abnormality Narrative Interpretation: no evidence of acute ischemia  ____________________________________________  RADIOLOGY  XR left foot: No acute bony findings  ____________________________________________   PROCEDURES  Procedure(s) performed: No  Procedures  Critical Care performed: No ____________________________________________   INITIAL IMPRESSION / ASSESSMENT AND PLAN / ED COURSE  Pertinent labs & imaging results that were available during my care of the patient  were reviewed by me and considered in my medical decision making (see chart for details).  67 year old male with history of ESRD on dialysis presents with dizziness and low diastolic blood pressure today, which caused him to not have his dialysis.  Patient states he is feeling somewhat better now.  He also reports discoloration of his left third toe over the last week.  On exam, the patient is relatively well-appearing, the blood pressure is borderline low but improved from when he reported it was at dialysis, and the remainder of the exam is as described above.  I reviewed the past medical records in Epic; the patient was seen by podiatry on 01/06/2018 and the exam described in the note by the podiatrist is essentially the same as today except for the discoloration now involving the third toe.  In terms of the dizziness and low blood pressure I suspect most likely mild dehydration/hypovolemia.  The patient is well-appearing.  We will obtain labs, give a small fluid bolus, and reassess.  Anticipate that the patient will be cleared to go to discharge if the work-up is negative.  In terms of the left foot, the patient has a history of peripheral artery disease.  I am concerned for worsening circulation to the third toe.  I will obtain an x-ray, and then discussed with vascular surgery although the patient does have follow-up scheduled for next week.  ----------------------------------------- 10:14 AM on 01/10/2018 -----------------------------------------  The patient's lab work-up is all at his baseline.  He is feeling significantly better, and his blood pressure has further improved.  He feels well to go home.  X-ray of the left foot shows no acute osseous abnormalities.  I consulted Dr. Delana Meyer from vascular surgery, who advised that in addition to the patient's appointment with Dr. Lucky Cowboy he should likely be scheduled for an angiography, and he stated he will contact Dr. Lucky Cowboy to arrange this.  He  advises otherwise the patient is appropriate for discharge home and does not require further intervention in the meantime.  I discussed the results of the work-up and the plan of care with  the patient and his family members.  I gave the patient the return precautions, and he expressed understanding. ____________________________________________   FINAL CLINICAL IMPRESSION(S) / ED DIAGNOSES  Final diagnoses:  Hypotension, unspecified hypotension type  Peripheral artery disease ([place])      NEW MEDICATIONS STARTED DURING THIS VISIT:  New Prescriptions   No medications on file     Note:  This document was prepared using Dragon voice recognition software and may include unintentional dictation errors.    Arta Silence, MD 01/10/18 1016

## 2018-01-10 NOTE — Discharge Instructions (Addendum)
Go to dialysis either today or tomorrow as discussed.  Follow-up with Dr. Lucky Cowboy next week as planned.  In addition to your appointment on Tuesday, you will likely have a procedure (angiography) done on Wednesday.  You should call Dr. Bunnie Domino office to confirm.  Return to the ER for new, worsening, persistent dizziness or lightheadedness, weakness, low blood pressure, worsening pain, redness, swelling, or change in color of the toes of your left foot, or any other new or worsening symptoms that concern you.  You may take Tylenol in addition to the Aleve for pain.

## 2018-01-10 NOTE — ED Notes (Signed)
Dr Cherylann Banas notified of elevated troponin of 0.03 - no new orders at this time

## 2018-01-10 NOTE — ED Notes (Signed)
Pt is being discharged to home. Pt is AOx4. AVS was given and explained to the pt and family. They all verbalized understanding of the AVS and discharge plan of care.

## 2018-01-10 NOTE — ED Notes (Addendum)
Pt came from dialysis but DID NOT receive dialysis this am due to report of dizziness for 2 days and hypotension Pt DOES NOT make urine

## 2018-01-10 NOTE — ED Triage Notes (Signed)
Pt arrived via ems for c/o dizziness x2 days and left foot pain (documented sore/blackened area on great toe and 2nd toe)

## 2018-01-11 ENCOUNTER — Inpatient Hospital Stay
Admission: EM | Admit: 2018-01-11 | Discharge: 2018-01-16 | DRG: 356 | Disposition: A | Discharge status: Skilled Nursing Facility | Payer: Medicare HMO | Attending: Internal Medicine | Admitting: Internal Medicine

## 2018-01-11 ENCOUNTER — Emergency Department: Payer: Medicare HMO

## 2018-01-11 ENCOUNTER — Encounter: Payer: Self-pay | Admitting: Emergency Medicine

## 2018-01-11 DIAGNOSIS — Z7901 Long term (current) use of anticoagulants: Secondary | ICD-10-CM

## 2018-01-11 DIAGNOSIS — Z833 Family history of diabetes mellitus: Secondary | ICD-10-CM

## 2018-01-11 DIAGNOSIS — E1122 Type 2 diabetes mellitus with diabetic chronic kidney disease: Secondary | ICD-10-CM | POA: Diagnosis present

## 2018-01-11 DIAGNOSIS — Z86718 Personal history of other venous thrombosis and embolism: Secondary | ICD-10-CM

## 2018-01-11 DIAGNOSIS — Z794 Long term (current) use of insulin: Secondary | ICD-10-CM

## 2018-01-11 DIAGNOSIS — R531 Weakness: Secondary | ICD-10-CM

## 2018-01-11 DIAGNOSIS — I252 Old myocardial infarction: Secondary | ICD-10-CM

## 2018-01-11 DIAGNOSIS — Z8673 Personal history of transient ischemic attack (TIA), and cerebral infarction without residual deficits: Secondary | ICD-10-CM

## 2018-01-11 DIAGNOSIS — K529 Noninfective gastroenteritis and colitis, unspecified: Principal | ICD-10-CM | POA: Diagnosis present

## 2018-01-11 DIAGNOSIS — Z992 Dependence on renal dialysis: Secondary | ICD-10-CM

## 2018-01-11 DIAGNOSIS — R1310 Dysphagia, unspecified: Secondary | ICD-10-CM | POA: Diagnosis present

## 2018-01-11 DIAGNOSIS — L97909 Non-pressure chronic ulcer of unspecified part of unspecified lower leg with unspecified severity: Secondary | ICD-10-CM

## 2018-01-11 DIAGNOSIS — I70299 Other atherosclerosis of native arteries of extremities, unspecified extremity: Secondary | ICD-10-CM

## 2018-01-11 DIAGNOSIS — Z955 Presence of coronary angioplasty implant and graft: Secondary | ICD-10-CM

## 2018-01-11 DIAGNOSIS — E1152 Type 2 diabetes mellitus with diabetic peripheral angiopathy with gangrene: Secondary | ICD-10-CM | POA: Diagnosis present

## 2018-01-11 DIAGNOSIS — I2510 Atherosclerotic heart disease of native coronary artery without angina pectoris: Secondary | ICD-10-CM | POA: Diagnosis present

## 2018-01-11 DIAGNOSIS — E785 Hyperlipidemia, unspecified: Secondary | ICD-10-CM | POA: Diagnosis present

## 2018-01-11 DIAGNOSIS — Z7982 Long term (current) use of aspirin: Secondary | ICD-10-CM

## 2018-01-11 DIAGNOSIS — E11621 Type 2 diabetes mellitus with foot ulcer: Secondary | ICD-10-CM | POA: Diagnosis present

## 2018-01-11 DIAGNOSIS — D631 Anemia in chronic kidney disease: Secondary | ICD-10-CM | POA: Diagnosis present

## 2018-01-11 DIAGNOSIS — L97529 Non-pressure chronic ulcer of other part of left foot with unspecified severity: Secondary | ICD-10-CM | POA: Diagnosis present

## 2018-01-11 DIAGNOSIS — I132 Hypertensive heart and chronic kidney disease with heart failure and with stage 5 chronic kidney disease, or end stage renal disease: Secondary | ICD-10-CM | POA: Diagnosis present

## 2018-01-11 DIAGNOSIS — I959 Hypotension, unspecified: Secondary | ICD-10-CM | POA: Diagnosis present

## 2018-01-11 DIAGNOSIS — I509 Heart failure, unspecified: Secondary | ICD-10-CM | POA: Diagnosis present

## 2018-01-11 DIAGNOSIS — N2581 Secondary hyperparathyroidism of renal origin: Secondary | ICD-10-CM | POA: Diagnosis present

## 2018-01-11 DIAGNOSIS — Z87891 Personal history of nicotine dependence: Secondary | ICD-10-CM

## 2018-01-11 DIAGNOSIS — N186 End stage renal disease: Secondary | ICD-10-CM

## 2018-01-11 DIAGNOSIS — R188 Other ascites: Secondary | ICD-10-CM

## 2018-01-11 DIAGNOSIS — Z951 Presence of aortocoronary bypass graft: Secondary | ICD-10-CM

## 2018-01-11 DIAGNOSIS — K652 Spontaneous bacterial peritonitis: Secondary | ICD-10-CM | POA: Diagnosis present

## 2018-01-11 LAB — HEPATIC FUNCTION PANEL
ALBUMIN: 2.9 g/dL — AB (ref 3.5–5.0)
ALT: 22 U/L (ref 0–44)
AST: 57 U/L — ABNORMAL HIGH (ref 15–41)
Alkaline Phosphatase: 290 U/L — ABNORMAL HIGH (ref 38–126)
BILIRUBIN INDIRECT: 1.7 mg/dL — AB (ref 0.3–0.9)
Bilirubin, Direct: 2.3 mg/dL — ABNORMAL HIGH (ref 0.0–0.2)
Total Bilirubin: 4 mg/dL — ABNORMAL HIGH (ref 0.3–1.2)
Total Protein: 8.6 g/dL — ABNORMAL HIGH (ref 6.5–8.1)

## 2018-01-11 LAB — CBC WITH DIFFERENTIAL/PLATELET
BASOS ABS: 0.1 10*3/uL (ref 0–0.1)
Basophils Relative: 1 %
EOS ABS: 0.1 10*3/uL (ref 0–0.7)
EOS PCT: 1 %
HCT: 30.9 % — ABNORMAL LOW (ref 40.0–52.0)
Hemoglobin: 10.1 g/dL — ABNORMAL LOW (ref 13.0–18.0)
LYMPHS PCT: 5 %
Lymphs Abs: 0.7 10*3/uL — ABNORMAL LOW (ref 1.0–3.6)
MCH: 26.9 pg (ref 26.0–34.0)
MCHC: 32.8 g/dL (ref 32.0–36.0)
MCV: 82 fL (ref 80.0–100.0)
Monocytes Absolute: 1 10*3/uL (ref 0.2–1.0)
Monocytes Relative: 7 %
Neutro Abs: 12.3 10*3/uL — ABNORMAL HIGH (ref 1.4–6.5)
Neutrophils Relative %: 88 %
Platelets: 175 10*3/uL (ref 150–440)
RBC: 3.76 MIL/uL — AB (ref 4.40–5.90)
RDW: 18 % — ABNORMAL HIGH (ref 11.5–14.5)
WBC: 14 10*3/uL — AB (ref 3.8–10.6)

## 2018-01-11 LAB — BASIC METABOLIC PANEL
Anion gap: 15 (ref 5–15)
BUN: 43 mg/dL — AB (ref 8–23)
CO2: 25 mmol/L (ref 22–32)
CREATININE: 7.58 mg/dL — AB (ref 0.61–1.24)
Calcium: 8.6 mg/dL — ABNORMAL LOW (ref 8.9–10.3)
Chloride: 95 mmol/L — ABNORMAL LOW (ref 98–111)
GFR, EST AFRICAN AMERICAN: 8 mL/min — AB (ref >60)
GFR, EST NON AFRICAN AMERICAN: 7 mL/min — AB (ref >60)
Glucose, Bld: 234 mg/dL — ABNORMAL HIGH (ref 70–99)
POTASSIUM: 5.1 mmol/L (ref 3.5–5.1)
SODIUM: 135 mmol/L (ref 135–145)

## 2018-01-11 LAB — TROPONIN I: TROPONIN I: 0.05 ng/mL — AB (ref <0.03)

## 2018-01-11 LAB — LIPASE, BLOOD: LIPASE: 26 U/L (ref 11–51)

## 2018-01-11 LAB — BRAIN NATRIURETIC PEPTIDE: B Natriuretic Peptide: 2375 pg/mL — ABNORMAL HIGH (ref 0.0–100.0)

## 2018-01-11 MED ORDER — FAMOTIDINE IN NACL 20-0.9 MG/50ML-% IV SOLN
20.0000 mg | Freq: Once | INTRAVENOUS | Status: AC
  Administered 2018-01-11: 20 mg via INTRAVENOUS
  Filled 2018-01-11: qty 50

## 2018-01-11 MED ORDER — IOHEXOL 300 MG/ML  SOLN
100.0000 mL | Freq: Once | INTRAMUSCULAR | Status: AC | PRN
  Administered 2018-01-11: 100 mL via INTRAVENOUS

## 2018-01-11 MED ORDER — ONDANSETRON HCL 4 MG/2ML IJ SOLN
4.0000 mg | Freq: Once | INTRAMUSCULAR | Status: AC
  Administered 2018-01-11: 4 mg via INTRAVENOUS
  Filled 2018-01-11: qty 2

## 2018-01-11 NOTE — ED Provider Notes (Signed)
[facility] Emergency Department Provider Note ____________________________________________   First MD Initiated Contact with Patient 01/11/18 2135     (approximate)  I have reviewed the triage vital signs and the nursing notes.   HISTORY  Chief Complaint Weakness    HPI Terry Tyler is a 67 y.o. male with PMH as noted below including ESRD on dialysis who presents primarily with generalized weakness, gradual onset over the last several days, and persistent today.  The patient was seen in the ED yesterday for this as well as hypotension noted when he was going for dialysis.  After his vital signs and symptoms improved he was sent home, however he states he did not end up going to dialysis today because he felt too weak.  The patient also reports nausea and several episodes of vomiting, as well as nearly continuous belching.  He reports worsening abdominal distention as well as some lower abdominal pain as well.  He denies shortness of breath or chest pain.  Past Medical History:  Diagnosis Date  . C. difficile diarrhea   . CHF (congestive heart failure) ([place])   . CKD (chronic kidney disease)   . Coronary atherosclerosis of unspecified type of vessel, native or graft    Myocardial infarction in 2006. Cardiac catheterization showed significant three-vessel coronary artery disease. He underwent [facility]G at [facility]. Most recent cardiac catheterization in 2010 showed normal LV systolic function, patent grafts to OM1, RCA and LAD. Occluded SVG to diagonal.  . 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 Dialysis patient [facility])    Mon. Wed. Fri. for dialysis  . Hyperlipidemia   . Hypertension   . Hypertension, renal disease   . Impotence of organic origin   . Keloid scar   . Myocardial infarction ([place]) 2006  . Neoplasm of uncertain behavior, site unspecified   . Peripheral vascular disease, unspecified ([place])   . Proteinuria   . Renal  insufficiency   . Stroke [facility]) 04/2016   TIA  . Unspecified vitamin D deficiency     Patient Active Problem List   Diagnosis Date Noted  . Acute CVA (cerebrovascular accident) ([place]) 11/25/2017  . Pneumonia 10/12/2017  . HCAP (healthcare-associated pneumonia) 10/11/2017  . CHF (congestive heart failure) ([place]) 07/16/2017  . GI bleed 05/07/2017  . 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 Hyperbilirubinemia 04/10/2017  . Diarrhea 02/20/2017  . C. difficile diarrhea 02/19/2017  . Diarrhea of presumed infectious origin 11/01/2016  . ESRD on dialysis ([place]) 10/16/2016  . Fatigue 10/02/2016  . Leukocytosis 10/02/2016  . Nausea & vomiting 10/02/2016  . Inferior myocardial infarction ([place]) 09/18/2016  . Elevated troponin 09/08/2016  . CVA (cerebral vascular accident) ([place]) 04/09/2016  . Hyperphosphatemia 08/09/2015  . Secondary hyperparathyroidism ([place]) 08/09/2015  . Chronic kidney disease (CKD) stage G4/A1, severely decreased glomerular filtration rate (GFR) between 15-29 mL/min/1.73 square meter and albuminuria creatinine ratio less than 30 mg/g (HCC) 05/31/2015  . Bilateral carotid artery stenosis 07/24/2014  . Hypertensive heart disease without heart failure 07/24/2014  . Nonrheumatic mitral valve insufficiency 07/24/2014  . Stenosis of carotid artery 07/24/2014  . Anemia 03/30/2013  . Vitamin D deficiency 03/30/2013  . Obesity 08/28/2012  . PAD (peripheral artery disease) ([place]) 04/10/2012  . CAD (coronary artery disease)   . Hyperlipidemia   . Hypertension   . Proteinuria 06/19/2010  . Type II diabetes mellitus ([place]) 06/19/2010    Past Surgical History:  Procedure Laterality Date  . A/V  FISTULAGRAM Left 01/03/2017   Procedure: A/V Fistulagram;  Surgeon: Algernon Huxley, MD;  Location: [place] [facility];  Service: Cardiovascular;  Laterality: Left;  . A/V FISTULAGRAM Left 06/06/2017   Procedure: A/V FISTULAGRAM;  Surgeon: Algernon Huxley, MD;  Location: [place] [facility]  [facility];  Service: Cardiovascular;  Laterality: Left;  . A/V FISTULAGRAM Left 09/02/2017   Procedure: A/V FISTULAGRAM;  Surgeon: Algernon Huxley, MD;  Location: [place] [facility];  Service: Cardiovascular;  Laterality: Left;  . A/V SHUNT INTERVENTION N/A 01/03/2017   Procedure: A/V Shunt Intervention;  Surgeon: Algernon Huxley, MD;  Location: [place] [facility];  Service: Cardiovascular;  Laterality: N/A;  . A/V SHUNT INTERVENTION N/A 06/06/2017   Procedure: A/V SHUNT INTERVENTION;  Surgeon: Algernon Huxley, MD;  Location: [place] [facility];  Service: Cardiovascular;  Laterality: N/A;  . AV FISTULA PLACEMENT Left 10/25/2016   Procedure: ARTERIOVENOUS (AV) FISTULA CREATION ( RADIOCEPHALIC );  Surgeon: Algernon Huxley, MD;  Location: [facility];  Service: Vascular;  Laterality: Left;  . CARDIAC CATHETERIZATION  12/2004   [facility]  . CARDIAC CATHETERIZATION  09/2008   [facility];[place]  . COLONOSCOPY WITH PROPOFOL N/A 05/09/2017   Procedure: COLONOSCOPY WITH PROPOFOL;  Surgeon: Toledo, Benay Pike, MD;  Location: [facility];  Service: Gastroenterology;  Laterality: N/A;  . CORONARY ANGIOPLASTY    . CORONARY ARTERY BYPASS GRAFT  2006   [facility]  . CORONARY STENT INTERVENTION N/A 09/10/2016   Procedure: Coronary Stent Intervention;  Surgeon: Isaias Cowman, MD;  Location: [place] [facility];  Service: Cardiovascular;  Laterality: N/A;  . DIALYSIS/PERMA CATHETER REMOVAL N/A 02/14/2017   Procedure: DIALYSIS/PERMA CATHETER REMOVAL;  Surgeon: Algernon Huxley, MD;  Location: [place] [facility];  Service: Cardiovascular;  Laterality: N/A;  . ESOPHAGOGASTRODUODENOSCOPY (EGD) WITH PROPOFOL N/A 05/08/2017   Procedure: ESOPHAGOGASTRODUODENOSCOPY (EGD) WITH PROPOFOL;  Surgeon: Toledo, Benay Pike, MD;  Location: [facility];  Service: Gastroenterology;  Laterality: N/A;  . keloid removal     right neck  . LEFT HEART CATH AND CORONARY ANGIOGRAPHY N/A 09/10/2016   Procedure: Left Heart Cath and Coronary Angiography;   Surgeon: Isaias Cowman, MD;  Location: [place] [facility];  Service: Cardiovascular;  Laterality: N/A;    Prior to Admission medications   Medication Sig Start Date End Date Taking? Authorizing Provider  aspirin EC 81 MG EC tablet Take 1 tablet (81 mg total) by mouth daily. Patient taking differently: Take 81 mg by mouth at bedtime.  07/22/17  Yes Salary, Avel Peace, MD  gabapentin (NEURONTIN) 300 MG capsule Take 300 mg by mouth 3 (three) times daily.   Yes [provider]  metoprolol tartrate (LOPRESSOR) 25 MG tablet Take 25 mg by mouth at bedtime.    Yes [provider]  multivitamin (RENA-VIT) TABS tablet Take 1 tablet by mouth daily. 08/19/17  Yes [provider]  pantoprazole (PROTONIX) 40 MG tablet Take 1 tablet (40 mg total) by mouth 2 (two) times daily before a meal. 05/10/17  Yes Gladstone Lighter, MD  rosuvastatin (CRESTOR) 20 MG tablet Take 20 mg by mouth at bedtime.    Yes [provider]  warfarin (COUMADIN) 5 MG tablet Take 1 tablet (5 mg total) by mouth at bedtime. 10/13/17  Yes Gladstone Lighter, MD  lactobacillus (FLORANEX/LACTINEX) PACK Take 1 packet (1 g total) by mouth 3 (three) times daily with meals. Patient not taking: Reported on 01/11/2018 07/22/17   Salary, Avel Peace, MD    Allergies Lisinopril; Atorvastatin  calcium; Ezetimibe; and Lipitor [atorvastatin]  Family History  Problem Relation Age of Onset  . Hypertension Mother   . Hyperlipidemia Mother   . Heart disease Mother   . Heart disease Father   . Hyperlipidemia Father   . Hypertension Father   . Kidney disease Sister   . Diabetes Sister   . Hypertension Sister   . Diabetes Brother   . Hypertension Brother   . HIV/AIDS Brother     Social History Social History   Tobacco Use  . Smoking status: Former Smoker    Packs/day: 0.50    Years: 0.00    Pack years: 0.00    Types: Cigarettes    Last attempt to quit: 06/11/1980    Years since quitting: 37.6  .  Smokeless tobacco: Never Used  Substance Use Topics  . Alcohol use: No    Comment: socially  . Drug use: No    Review of Systems  Constitutional: No fever.  Positive for generalized weakness. Eyes: No redness. ENT: No sore throat. Cardiovascular: Denies chest pain. Respiratory: Denies shortness of breath. Gastrointestinal: Positive for nausea and vomiting. Genitourinary: Negative for dysuria.   Musculoskeletal: Negative for back pain. Skin: Negative for rash. Neurological: Negative for headache.   ____________________________________________   PHYSICAL EXAM:  VITAL SIGNS: ED Triage Vitals  Enc Vitals Group     BP 01/11/18 2108 111/74     Pulse Rate 01/11/18 2108 88     Resp 01/11/18 2108 16     Temp 01/11/18 2108 99 F (37.2 C)     Temp Source 01/11/18 2108 Oral     SpO2 01/11/18 2106 95 %     Weight 01/11/18 2108 205 lb (93 kg)     Height 01/11/18 2108 6' (1.829 m)     Head Circumference --      Peak Flow --      Pain Score 01/11/18 2108 2     Pain Loc --      Pain Edu? --      Excl. in [place]? --     Constitutional: Alert and oriented.  Uncomfortable appearing but no acute distress. Eyes: Conjunctivae are normal.  Head: Atraumatic. Nose: No congestion/rhinnorhea. Mouth/Throat: Mucous membranes are somewhat dry.   Neck: Normal range of motion.  Cardiovascular: Normal rate, regular rhythm. Grossly normal heart sounds.  Good peripheral circulation. Respiratory: Normal respiratory effort.  No retractions.  Somewhat decreased breath sounds bilaterally but lungs otherwise clear. Gastrointestinal: Soft with mild left lower quadrant discomfort to palpation. Significantly distended. Genitourinary: No flank tenderness. Musculoskeletal: No lower extremity edema.  Extremities warm and well perfused.  Neurologic:  Normal speech and language. No gross focal neurologic deficits are appreciated.  Skin:  Skin is warm and dry. No rash noted. Psychiatric: Mood and affect are  normal. Speech and behavior are normal.  ____________________________________________   LABS (all labs ordered are listed, but only abnormal results are displayed)  Labs Reviewed  CBC WITH DIFFERENTIAL/PLATELET - Abnormal; Notable for the following components:      Result Value   WBC 14.0 (*)    RBC 3.76 (*)    Hemoglobin 10.1 (*)    HCT 30.9 (*)    RDW 18.0 (*)    Neutro Abs 12.3 (*)    Lymphs Abs 0.7 (*)    All other components within normal limits  TROPONIN I - Abnormal; Notable for the following components:   Troponin I 0.05 (*)    All other components within normal limits  BASIC METABOLIC PANEL - Abnormal; Notable for the following components:   Chloride 95 (*)    Glucose, Bld 234 (*)    BUN 43 (*)    Creatinine, Ser 7.58 (*)    Calcium 8.6 (*)    GFR calc non Af Amer 7 (*)    GFR calc Af Amer 8 (*)    All other components within normal limits  HEPATIC FUNCTION PANEL - Abnormal; Notable for the following components:   Total Protein 8.6 (*)    Albumin 2.9 (*)    AST 57 (*)    Alkaline Phosphatase 290 (*)    Total Bilirubin 4.0 (*)    Bilirubin, Direct 2.3 (*)    Indirect Bilirubin 1.7 (*)    All other components within normal limits  LIPASE, BLOOD  URINALYSIS, COMPLETE (UACMP) WITH MICROSCOPIC  BRAIN NATRIURETIC PEPTIDE   ____________________________________________  EKG  ED ECG REPORT I, Arta Silence, the attending physician, personally viewed and interpreted this ECG.  Date: 01/11/2018 EKG Time: 2103 Rate: 85 Rhythm: normal sinus rhythm QRS Axis: normal Intervals: Prolonged PR ST/T Wave abnormalities: Repolarization abnormality Narrative Interpretation: no evidence of acute ischemia; no significant change when compared to EKG performed yesterday  ____________________________________________  RADIOLOGY  CXR: Cardiomegaly with no focal infiltrate CT abdomen: Ascites.  Findings consistent with enteritis.  No evidence of obstruction.   ____________________________________________   PROCEDURES  Procedure(s) performed: No  Procedures  Critical Care performed: No ____________________________________________   INITIAL IMPRESSION / ASSESSMENT AND PLAN / ED COURSE  Pertinent labs & imaging results that were available during my care of the patient were reviewed by me and considered in my medical decision making (see chart for details).  67 year old male with PMH as noted above including ESRD on dialysis as well as ascites presents with worsening generalized weakness over the last several days, as well as nausea and vomiting and some lower abdominal discomfort.  I reviewed the past medical records in Epic; but I actually also evaluated the patient yesterday.  He was seen in the ED for similar symptoms as well as low diastolic blood pressure when he was going to dialysis.  His work-up yesterday was negative for acute findings, and his blood pressure improved after a small fluid bolus.  He stated that he would go to dialysis either right after leaving the hospital or today.  However, the patient now states that he did not end up going to dialysis because he felt too weak.  On exam, the vital signs are normal, and the patient is overall uncomfortable appearing, almost continuously belching during my exam.  He reports that he has had this bulging for some time, and it happens when his ascites gets worse.  He has some lower abdominal discomfort but no focal tenderness, and no other significant acute exam findings.  Differential for the generalized weakness includes electrolyte abnormality, uremia, other metabolic etiology, dehydration, although I now favor more volume overload, or infection.  Patient also likely has worsening ascites, however he has only minimal tenderness, no fever, no clinical evidence of SBP.  Plan: Chest x-ray, CT abdomen, lab work-up, and reassess.  The patient will almost certainly need to be admitted for  dialysis either tonight or tomorrow.  ----------------------------------------- 11:33 PM on 01/11/2018 -----------------------------------------  Lab work-up is not significantly changed from yesterday other than worsening bilirubin and WBC count.  CT shows likely enteritis.  As above, the patient will require admission.  I signed the patient out to the hospitalist Dr. Bridgett Larsson.  ____________________________________________   FINAL CLINICAL IMPRESSION(S) / ED DIAGNOSES  Final diagnoses:  Generalized weakness  Other ascites  End stage renal disease ([place])      NEW MEDICATIONS STARTED DURING THIS VISIT:  New Prescriptions   No medications on file     Note:  This document was prepared using Dragon voice recognition software and may include unintentional dictation errors.    Arta Silence, MD 01/11/18 [phone]

## 2018-01-11 NOTE — H&P (Signed)
[facility] [place] NAME: Terry Tyler    MR#:  [id number]  DATE OF BIRTH:  1951-04-24  DATE OF ADMISSION:  01/11/2018  PRIMARY CARE PHYSICIAN: Elisabeth Cara, NP   REQUESTING/REFERRING PHYSICIAN: Arta Silence, MD  CHIEF COMPLAINT:   Chief Complaint  Patient presents with  . Weakness   Nausea, vomiting and diarrhea and generalized weakness several days. HISTORY OF PRESENT ILLNESS:  Terry Tyler  is a 67 y.o. male with a known history of multiple medical problems as below.  The patient presented to the ED with above chief complaints.  In addition, he complains of abdominal pain and worsening distention.  She came to ED yesterday and was found hypotension.  He missed hemodialysis due to nausea and vomiting.  PAST MEDICAL HISTORY:   Past Medical History:  Diagnosis Date  . C. difficile diarrhea   . CHF (congestive heart failure) ([place])   . CKD (chronic kidney disease)   . Coronary atherosclerosis of unspecified type of vessel, native or graft    Myocardial infarction in 2006. Cardiac catheterization showed significant three-vessel coronary artery disease. He underwent CABG at [facility]. Most recent cardiac catheterization in 2010 showed normal LV systolic function, patent grafts to OM1, RCA and LAD. Occluded SVG to diagonal.  . 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 Dialysis patient [facility])    Mon. Wed. Fri. for dialysis  . Hyperlipidemia   . Hypertension   . Hypertension, renal disease   . Impotence of organic origin   . Keloid scar   . Myocardial infarction ([place]) 2006  . Neoplasm of uncertain behavior, site unspecified   . Peripheral vascular disease, unspecified ([place])   . Proteinuria   . Renal insufficiency   . Stroke [facility]) 04/2016   TIA  . Unspecified vitamin D deficiency     PAST SURGICAL HISTORY:   Past Surgical History:  Procedure Laterality Date  . A/V FISTULAGRAM Left 01/03/2017   Procedure: A/V Fistulagram;  Surgeon: Algernon Huxley, MD;  Location: [place] [facility];  Service: Cardiovascular;  Laterality: Left;  . A/V FISTULAGRAM Left 06/06/2017   Procedure: A/V FISTULAGRAM;  Surgeon: Algernon Huxley, MD;  Location: [place] [facility];  Service: Cardiovascular;  Laterality: Left;  . A/V FISTULAGRAM Left 09/02/2017   Procedure: A/V FISTULAGRAM;  Surgeon: Algernon Huxley, MD;  Location: [place] [facility];  Service: Cardiovascular;  Laterality: Left;  . A/V SHUNT INTERVENTION N/A 01/03/2017   Procedure: A/V Shunt Intervention;  Surgeon: Algernon Huxley, MD;  Location: [place] [facility];  Service: Cardiovascular;  Laterality: N/A;  . A/V SHUNT INTERVENTION N/A 06/06/2017   Procedure: A/V SHUNT INTERVENTION;  Surgeon: Algernon Huxley, MD;  Location: [place] [facility];  Service: Cardiovascular;  Laterality: N/A;  . AV FISTULA PLACEMENT Left 10/25/2016   Procedure: ARTERIOVENOUS (AV) FISTULA CREATION ( RADIOCEPHALIC );  Surgeon: Algernon Huxley, MD;  Location: [facility] OR[facility];  Service: Vascular;  Laterality: Left;  . CARDIAC CATHETERIZATION  12/2004   [facility];[place]  . CARDIAC CATHETERIZATION  09/2008   [facility];[place]  . COLONOSCOPY WITH PROPOFOL N/A 05/09/2017   Procedure: COLONOSCOPY WITH PROPOFOL;  Surgeon: Toledo, Benay Pike, MD;  Location: [facility] END[facility];  Service: Gastroenterology;  Laterality: N/A;  . CORONARY ANGIOPLASTY    . CORONARY ARTERY BYPASS GRAFT  2006   [facility]  . CORONARY STENT INTERVENTION N/A 09/10/2016   Procedure: Coronary Stent Intervention;  Surgeon: Isaias Cowman,  MD;  Location: [place] [facility];  Service: Cardiovascular;  Laterality: N/A;  . DIALYSIS/PERMA CATHETER REMOVAL N/A 02/14/2017   Procedure: DIALYSIS/PERMA CATHETER REMOVAL;  Surgeon: Algernon Huxley, MD;  Location: [place] [facility];  Service: Cardiovascular;  Laterality: N/A;  . ESOPHAGOGASTRODUODENOSCOPY (EGD) WITH PROPOFOL N/A 05/08/2017   Procedure: ESOPHAGOGASTRODUODENOSCOPY (EGD) WITH PROPOFOL;   Surgeon: Toledo, Benay Pike, MD;  Location: [facility];  Service: Gastroenterology;  Laterality: N/A;  . keloid removal     right neck  . LEFT HEART CATH AND CORONARY ANGIOGRAPHY N/A 09/10/2016   Procedure: Left Heart Cath and Coronary Angiography;  Surgeon: Isaias Cowman, MD;  Location: [place] [facility];  Service: Cardiovascular;  Laterality: N/A;    SOCIAL HISTORY:   Social History   Tobacco Use  . Smoking status: Former Smoker    Packs/day: 0.50    Years: 0.00    Pack years: 0.00    Types: Cigarettes    Last attempt to quit: 06/11/1980    Years since quitting: 37.6  . Smokeless tobacco: Never Used  Substance Use Topics  . Alcohol use: No    Comment: socially    FAMILY HISTORY:   Family History  Problem Relation Age of Onset  . Hypertension Mother   . Hyperlipidemia Mother   . Heart disease Mother   . Heart disease Father   . Hyperlipidemia Father   . Hypertension Father   . Kidney disease Sister   . Diabetes Sister   . Hypertension Sister   . Diabetes Brother   . Hypertension Brother   . HIV/AIDS Brother     DRUG ALLERGIES:   Allergies  Allergen Reactions  . Lisinopril Swelling  . Atorvastatin Calcium Other (See Comments)    Muscle cramps  Muscle cramps  Other reaction(s): Other (See Comments), Other (See Comments), Unknown Muscle cramping Muscle cramps  . Ezetimibe Other (See Comments)    Cannot remember what the allergy was. Other reaction(s): Other (See Comments), Other (See Comments), Unknown Cannot remember what the allergy was.  . Lipitor [Atorvastatin] Other (See Comments)    Muscle cramping    REVIEW OF SYSTEMS:   Review of Systems  Constitutional: Positive for malaise/fatigue. Negative for chills and fever.  HENT: Negative for sore throat.   Eyes: Negative for blurred vision and double vision.  Respiratory: Negative for cough, hemoptysis, shortness of breath, wheezing and stridor.   Cardiovascular: Negative for chest pain,  palpitations, orthopnea and leg swelling.  Gastrointestinal: Positive for abdominal pain, diarrhea, nausea and vomiting. Negative for blood in stool and melena.  Genitourinary: Negative for dysuria, flank pain and hematuria.  Musculoskeletal: Negative for back pain and joint pain.  Skin: Negative for rash.  Neurological: Negative for dizziness, sensory change, focal weakness, seizures, loss of consciousness, weakness and headaches.  Endo/Heme/Allergies: Negative for polydipsia.  Psychiatric/Behavioral: Negative for depression. The patient is not nervous/anxious.     MEDICATIONS AT HOME:   Prior to Admission medications   Medication Sig Start Date End Date Taking? Authorizing Provider  aspirin EC 81 MG EC tablet Take 1 tablet (81 mg total) by mouth daily. Patient taking differently: Take 81 mg by mouth at bedtime.  07/22/17  Yes Salary, Avel Peace, MD  gabapentin (NEURONTIN) 300 MG capsule Take 300 mg by mouth 3 (three) times daily.   Yes [provider]  metoprolol tartrate (LOPRESSOR) 25 MG tablet Take 25 mg by mouth at bedtime.    Yes [provider]  multivitamin (RENA-VIT) TABS tablet Take  1 tablet by mouth daily. 08/19/17  Yes [provider]  pantoprazole (PROTONIX) 40 MG tablet Take 1 tablet (40 mg total) by mouth 2 (two) times daily before a meal. 05/10/17  Yes Gladstone Lighter, MD  rosuvastatin (CRESTOR) 20 MG tablet Take 20 mg by mouth at bedtime.    Yes [provider]  warfarin (COUMADIN) 5 MG tablet Take 1 tablet (5 mg total) by mouth at bedtime. 10/13/17  Yes Gladstone Lighter, MD  lactobacillus (FLORANEX/LACTINEX) PACK Take 1 packet (1 g total) by mouth 3 (three) times daily with meals. Patient not taking: Reported on 01/11/2018 07/22/17   Salary, Holly Bodily D, MD      VITAL SIGNS:  Blood pressure 111/74, pulse 88, temperature 99 F (37.2 C), temperature source Oral, resp. rate 16, height 6' (1.829 m), weight 205 lb (93 kg), SpO2 96  %.  PHYSICAL EXAMINATION:  Physical Exam  GENERAL:  67 y.o.-year-old patient lying in the bed with no acute distress.  EYES: Pupils equal, round, reactive to light and accommodation. No scleral icterus. Extraocular muscles intact.  HEENT: Head atraumatic, normocephalic. Oropharynx and nasopharynx clear.  NECK:  Supple, positive jugular venous distention. No thyroid enlargement, no tenderness.  LUNGS: Normal breath sounds bilaterally, no wheezing, rales,rhonchi or crepitation. No use of accessory muscles of respiration.  CARDIOVASCULAR: S1, S2 normal. No murmurs, rubs, or gallops.  ABDOMEN: Soft, nontender, highly distended, positive ascites sign. Bowel sounds present.  Unable to exam organomegaly or mass.  EXTREMITIES: Bilateral leg edema 1+, no cyanosis, or clubbing.  NEUROLOGIC: Cranial nerves II through XII are intact. Muscle strength 4/5 in all extremities. Sensation intact. Gait not checked.  PSYCHIATRIC: The patient is alert and oriented x 3.  SKIN: No obvious rash, lesion, or ulcer.   LABORATORY PANEL:   CBC Recent Labs  Lab 01/11/18 2118  WBC 14.0*  HGB 10.1*  HCT 30.9*  PLT 175   ------------------------------------------------------------------------------------------------------------------  Chemistries  Recent Labs  Lab 01/11/18 2118  NA 135  K 5.1  CL 95*  CO2 25  GLUCOSE 234*  BUN 43*  CREATININE 7.58*  CALCIUM 8.6*  AST 57*  ALT 22  ALKPHOS 290*  BILITOT 4.0*   ------------------------------------------------------------------------------------------------------------------  Cardiac Enzymes Recent Labs  Lab 01/11/18 2118  TROPONINI 0.05*   ------------------------------------------------------------------------------------------------------------------  RADIOLOGY:  Ct Abdomen Pelvis W Contrast  Result Date: 01/11/2018 CLINICAL DATA:  Weakness.  Acute abdominal pain.  Fever. EXAM: CT ABDOMEN AND PELVIS WITH CONTRAST TECHNIQUE: Multidetector CT  imaging of the abdomen and pelvis was performed using the standard protocol following bolus administration of intravenous contrast. CONTRAST:  129mL OMNIPAQUE IOHEXOL 300 MG/ML  SOLN COMPARISON:  Most recent abdominal CT 11/25/2017 FINDINGS: Lower chest: The heart is enlarged. Coronary artery calcifications versus stents. Small left pleural effusion. Scattered linear atelectasis in both lower lobes. Hepatobiliary: Liver is enlarged. Tiny hypodensity in the right hepatic lobe is unchanged and may represent a cyst, too small to characterize. No suspicious enhancing lesion. Gallstone within the gallbladder without abnormal gallbladder distention. No biliary dilatation. Pancreas: Unremarkable. No pancreatic ductal dilatation or surrounding inflammatory changes. Spleen: Splenomegaly, spleen measures 15.9 cm cranial caudal. No focal abnormality. Adrenals/Urinary Tract: Bilateral adrenal thickening. Bilateral renal atrophy without hydronephrosis. Cyst in the posterior right kidney with additional partially calcified lesion, incompletely characterize but likely cyst. Cyst in the medial left kidney. Urinary bladder completely empty. Stomach/Bowel: Prominent intra-abdominal ascites and lack of enteric contrast limits bowel evaluation. Patulous distal esophagus. Stomach distended with air in fluid. Increased air throughout  nondilated small bowel. Small volume of colonic stool. Normal appendix. Vascular/Lymphatic: Advanced aortic and branch atherosclerosis. No aneurysm. No bulky adenopathy. Multiple small retroperitoneal nodes are unchanged from prior exam. Reproductive: Prostate is unremarkable. Other: Large volume intra-abdominal ascites. Diffuse mesenteric edema and body wall edema. No free air. Musculoskeletal: Multilevel degenerative change in the spine. There are no acute or suspicious osseous abnormalities. IMPRESSION: 1. Large volume ascites, slightly increased in degree compared to CT 6 weeks prior. 2. Slight increased  air within nondistended small bowel, nonspecific as to ileus versus enteritis. No evidence of primary bowel inflammation, however lack contrast and presence of ascites limits assessment. No bowel obstruction. 3. Mild splenomegaly. 4. Unchanged chronic findings include calcified gallstone, atrophic kidneys consistent with chronic renal disease, and advanced Aortic Atherosclerosis (ICD10-I70.0). Electronically Signed   By: Jeb Levering M.D.   On: 01/11/2018 23:04   Dg Chest Portable 1 View  Result Date: 01/11/2018 CLINICAL DATA:  Weakness, end-stage renal disease on dialysis, hypotensive episode yesterday treated with IV fluids, persistent weakness today EXAM: PORTABLE CHEST 1 VIEW COMPARISON:  Portable exam 2313 hours compared to 11/04/2017 FINDINGS: Enlargement of cardiac silhouette post CABG. Mediastinal contours and pulmonary vascularity normal. Improved pulmonary vascular congestion since previous exam. Minimal RIGHT basilar atelectasis. No definite infiltrate, pleural effusion or pneumothorax. IMPRESSION: Enlargement of cardiac silhouette post CABG. Minimal RIGHT basilar atelectasis. Electronically Signed   By: Lavonia Dana M.D.   On: 01/11/2018 23:41   Dg Foot 2 Views Left  Result Date: 01/10/2018 CLINICAL DATA:  Ulcer to the dorsal surface of the foot. EXAM: LEFT FOOT - 2 VIEW COMPARISON:  None. FINDINGS: Diffuse soft tissue swelling is present over the distal foot and extending into the digits. No underlying erosions are present. Microvascular calcifications are compatible with diabetes. IMPRESSION: Diffuse soft tissue swelling over the dorsum of the foot and into the digits. This may reflect cellulitis. 1. No focal osseous destruction. 2. Microvascular calcifications compatible with the given diagnosis of diabetes. Electronically Signed   By: San Morelle M.D.   On: 01/10/2018 09:03      IMPRESSION AND PLAN:   Acute gastroenteritis. The patient will be placed to  observation. Supportive care, Zofran as needed.  Ascites.  Paracentesis tomorrow.  Missed hemodialysis.  Nephrology consult for dialysis tomorrow. Hypertension.  Hold Lopressor due to low side blood pressure. Diabetes.  Start sliding scale. History of DVT.  Continue Coumadin pharmacy to dose. All the records are reviewed and case discussed with ED provider. Management plans discussed with the patient, his wife and they are in agreement.  CODE STATUS: Full code  TOTAL TIME TAKING CARE OF THIS PATIENT: 43 minutes.    Demetrios Loll M.D on 01/11/2018 at 11:51 PM  Between 7am to 6pm - Pager - [phone]  After 6pm go to [email or web address] - Proofreader  [facility]  Office  [phone]  CC: Primary care physician; Elisabeth Cara, NP   Note: This dictation was prepared with Dragon dictation along with smaller phrase technology. Any transcriptional errors that result from this process are unin

## 2018-01-11 NOTE — ED Triage Notes (Signed)
Patient brought in by ems from home for weakness. Patient was sent here yesterday from dialysis from hypotension and given fluids and discharged. Patient reports that he felt weak yesterday and that he has continued to feel to weak to get out of bed today. Patient's last dialysis was on Wednesday.

## 2018-01-12 ENCOUNTER — Other Ambulatory Visit: Payer: Self-pay

## 2018-01-12 LAB — MRSA PCR SCREENING: MRSA by PCR: NEGATIVE

## 2018-01-12 LAB — CBC
HCT: 28.1 % — ABNORMAL LOW (ref 40.0–52.0)
Hemoglobin: 9.4 g/dL — ABNORMAL LOW (ref 13.0–18.0)
MCH: 27.2 pg (ref 26.0–34.0)
MCHC: 33.5 g/dL (ref 32.0–36.0)
MCV: 81.3 fL (ref 80.0–100.0)
PLATELETS: 191 10*3/uL (ref 150–440)
RBC: 3.46 MIL/uL — ABNORMAL LOW (ref 4.40–5.90)
RDW: 18.6 % — AB (ref 11.5–14.5)
WBC: 14.3 10*3/uL — AB (ref 3.8–10.6)

## 2018-01-12 LAB — C DIFFICILE QUICK SCREEN W PCR REFLEX
C DIFFICILE (CDIFF) INTERP: NOT DETECTED
C Diff antigen: NEGATIVE
C Diff toxin: NEGATIVE

## 2018-01-12 LAB — BASIC METABOLIC PANEL
Anion gap: 15 (ref 5–15)
BUN: 45 mg/dL — AB (ref 8–23)
CHLORIDE: 96 mmol/L — AB (ref 98–111)
CO2: 23 mmol/L (ref 22–32)
CREATININE: 7.83 mg/dL — AB (ref 0.61–1.24)
Calcium: 8.6 mg/dL — ABNORMAL LOW (ref 8.9–10.3)
GFR calc Af Amer: 7 mL/min — ABNORMAL LOW (ref >60)
GFR calc non Af Amer: 6 mL/min — ABNORMAL LOW (ref >60)
Glucose, Bld: 236 mg/dL — ABNORMAL HIGH (ref 70–99)
Potassium: 4.5 mmol/L (ref 3.5–5.1)
SODIUM: 134 mmol/L — AB (ref 135–145)

## 2018-01-12 LAB — GLUCOSE, CAPILLARY
Glucose-Capillary: 210 mg/dL — ABNORMAL HIGH (ref 70–99)
Glucose-Capillary: 239 mg/dL — ABNORMAL HIGH (ref 70–99)
Glucose-Capillary: 207 mg/dL — ABNORMAL HIGH (ref 70–99)
Glucose-Capillary: 131 mg/dL — ABNORMAL HIGH (ref 70–99)
Glucose-Capillary: 192 mg/dL — ABNORMAL HIGH (ref 70–99)

## 2018-01-12 LAB — PROTIME-INR
INR: 2.94
Prothrombin Time: 30.4 seconds — ABNORMAL HIGH (ref 11.4–15.2)

## 2018-01-12 LAB — PHOSPHORUS: Phosphorus: 5.8 mg/dL — ABNORMAL HIGH (ref 2.5–4.6)

## 2018-01-12 MED ORDER — ALBUMIN HUMAN 25 % IV SOLN
12.5000 g | Freq: Once | INTRAVENOUS | Status: AC
  Administered 2018-01-12: 12.5 g via INTRAVENOUS
  Filled 2018-01-12: qty 50

## 2018-01-12 MED ORDER — SODIUM CHLORIDE 0.9 % IV SOLN: 100.0000 mL | INTRAVENOUS | Status: DC | PRN

## 2018-01-12 MED ORDER — HEPARIN SODIUM (PORCINE) 1000 UNIT/ML DIALYSIS
1000.0000 [IU] | INTRAMUSCULAR | Status: DC | PRN
  Filled 2018-01-12: qty 1

## 2018-01-12 MED ORDER — SODIUM CHLORIDE 0.9% FLUSH: 3.0000 mL | INTRAVENOUS | Status: DC | PRN

## 2018-01-12 MED ORDER — ALBUTEROL SULFATE (2.5 MG/3ML) 0.083% IN NEBU: 2.5000 mg | INHALATION_SOLUTION | RESPIRATORY_TRACT | Status: DC | PRN

## 2018-01-12 MED ORDER — SODIUM CHLORIDE 0.9 % IV SOLN: 250.0000 mL | INTRAVENOUS | Status: DC | PRN

## 2018-01-12 MED ORDER — CIPROFLOXACIN HCL 500 MG PO TABS
500.0000 mg | ORAL_TABLET | ORAL | Status: DC
  Administered 2018-01-12 – 2018-01-16 (×4): 500 mg via ORAL
  Filled 2018-01-12 (×5): qty 1

## 2018-01-12 MED ORDER — GABAPENTIN 300 MG PO CAPS
300.0000 mg | ORAL_CAPSULE | Freq: Three times a day (TID) | ORAL | Status: DC
  Administered 2018-01-12 – 2018-01-16 (×11): 300 mg via ORAL
  Filled 2018-01-12 (×11): qty 1

## 2018-01-12 MED ORDER — ASPIRIN EC 81 MG PO TBEC
81.0000 mg | DELAYED_RELEASE_TABLET | Freq: Every day | ORAL | Status: DC
  Administered 2018-01-12 – 2018-01-14 (×3): 81 mg via ORAL
  Filled 2018-01-12 (×4): qty 1

## 2018-01-12 MED ORDER — LIDOCAINE-PRILOCAINE 2.5-2.5 % EX CREA
1.0000 "application " | TOPICAL_CREAM | CUTANEOUS | Status: DC | PRN
  Filled 2018-01-12: qty 5

## 2018-01-12 MED ORDER — INSULIN ASPART 100 UNIT/ML ~~LOC~~ SOLN
0.0000 [IU] | Freq: Three times a day (TID) | SUBCUTANEOUS | Status: DC
  Administered 2018-01-12 (×2): 3 [IU] via SUBCUTANEOUS
  Administered 2018-01-12: 1 [IU] via SUBCUTANEOUS
  Administered 2018-01-13 (×2): 2 [IU] via SUBCUTANEOUS
  Administered 2018-01-14 (×2): 3 [IU] via SUBCUTANEOUS
  Administered 2018-01-14: 5 [IU] via SUBCUTANEOUS
  Administered 2018-01-15 (×2): 2 [IU] via SUBCUTANEOUS
  Administered 2018-01-15: 1 [IU] via SUBCUTANEOUS
  Administered 2018-01-16 (×2): 3 [IU] via SUBCUTANEOUS
  Administered 2018-01-16: 5 [IU] via SUBCUTANEOUS
  Filled 2018-01-12 (×14): qty 1

## 2018-01-12 MED ORDER — WARFARIN SODIUM 5 MG PO TABS
5.0000 mg | ORAL_TABLET | Freq: Every day | ORAL | Status: DC
  Filled 2018-01-12: qty 1

## 2018-01-12 MED ORDER — ONDANSETRON HCL 4 MG PO TABS
4.0000 mg | ORAL_TABLET | Freq: Four times a day (QID) | ORAL | Status: DC | PRN
  Administered 2018-01-16: 4 mg via ORAL
  Filled 2018-01-12: qty 1

## 2018-01-12 MED ORDER — INSULIN ASPART 100 UNIT/ML ~~LOC~~ SOLN
0.0000 [IU] | Freq: Every day | SUBCUTANEOUS | Status: DC
  Administered 2018-01-12 – 2018-01-14 (×3): 2 [IU] via SUBCUTANEOUS
  Filled 2018-01-12 (×3): qty 1

## 2018-01-12 MED ORDER — HYDROCODONE-ACETAMINOPHEN 5-325 MG PO TABS
1.0000 | ORAL_TABLET | ORAL | Status: DC | PRN
  Administered 2018-01-14: 1 via ORAL
  Administered 2018-01-14: 2 via ORAL
  Filled 2018-01-12: qty 1
  Filled 2018-01-12: qty 2

## 2018-01-12 MED ORDER — ACETAMINOPHEN 325 MG PO TABS
650.0000 mg | ORAL_TABLET | Freq: Four times a day (QID) | ORAL | Status: DC | PRN
  Administered 2018-01-13 – 2018-01-16 (×2): 650 mg via ORAL
  Filled 2018-01-12 (×2): qty 2

## 2018-01-12 MED ORDER — PENTAFLUOROPROP-TETRAFLUOROETH EX AERO
1.0000 "application " | INHALATION_SPRAY | CUTANEOUS | Status: DC | PRN
  Filled 2018-01-12: qty 30

## 2018-01-12 MED ORDER — LIDOCAINE HCL (PF) 1 % IJ SOLN
5.0000 mL | INTRAMUSCULAR | Status: DC | PRN
  Filled 2018-01-12: qty 5

## 2018-01-12 MED ORDER — PANTOPRAZOLE SODIUM 40 MG PO TBEC
40.0000 mg | DELAYED_RELEASE_TABLET | Freq: Two times a day (BID) | ORAL | Status: DC
  Administered 2018-01-12 – 2018-01-16 (×9): 40 mg via ORAL
  Filled 2018-01-12 (×9): qty 1

## 2018-01-12 MED ORDER — CHLORHEXIDINE GLUCONATE CLOTH 2 % EX PADS
6.0000 | MEDICATED_PAD | Freq: Every day | CUTANEOUS | Status: DC
  Administered 2018-01-14: 6 via TOPICAL

## 2018-01-12 MED ORDER — ROSUVASTATIN CALCIUM 10 MG PO TABS
20.0000 mg | ORAL_TABLET | Freq: Every day | ORAL | Status: DC
  Administered 2018-01-12 – 2018-01-16 (×4): 20 mg via ORAL
  Filled 2018-01-12 (×4): qty 2
  Filled 2018-01-12 (×2): qty 1

## 2018-01-12 MED ORDER — WARFARIN - PHARMACIST DOSING INPATIENT: Freq: Every day | Status: DC

## 2018-01-12 MED ORDER — RENA-VITE PO TABS
1.0000 | ORAL_TABLET | Freq: Every day | ORAL | Status: DC
  Administered 2018-01-12 – 2018-01-16 (×4): 1 via ORAL
  Filled 2018-01-12 (×4): qty 1

## 2018-01-12 MED ORDER — ACETAMINOPHEN 650 MG RE SUPP: 650.0000 mg | Freq: Four times a day (QID) | RECTAL | Status: DC | PRN

## 2018-01-12 MED ORDER — SODIUM CHLORIDE 0.9% FLUSH
3.0000 mL | Freq: Two times a day (BID) | INTRAVENOUS | Status: DC
  Administered 2018-01-12 – 2018-01-16 (×10): 3 mL via INTRAVENOUS

## 2018-01-12 MED ORDER — ONDANSETRON HCL 4 MG/2ML IJ SOLN
4.0000 mg | Freq: Four times a day (QID) | INTRAMUSCULAR | Status: DC | PRN
  Administered 2018-01-12 – 2018-01-14 (×3): 4 mg via INTRAVENOUS
  Filled 2018-01-12 (×3): qty 2

## 2018-01-12 MED ORDER — ALTEPLASE 2 MG IJ SOLR: 2.0000 mg | Freq: Once | INTRAMUSCULAR | Status: DC | PRN

## 2018-01-12 NOTE — Care Management Note (Signed)
Case Management Note  Patient Details  Name: Terry Tyler MRN: [id number] Date of Birth: Apr 19, 1951  Subjective/Objective:   Patient admitted to [facility] under observation status for acute gastroenteritis. RNCM consulted on patient to provide MOON letter and complete assessment. Patient lives at home with wife.  HD patient, goes on MWF.  Patient states that his uncle provides transportation to HD and appointments.  PCP Frederico Hamman.  Patient denies issues obtaining medication.  Patient has RW and cane in the home and uses an electric scooter when he is out. Used [facility] home health in the past.                      Action/Plan: RNCM to continue to follow for any needs. Will notify HD liaison of admission  Expected Discharge Date:  01/12/18               Expected Discharge Plan:     In-House Referral:     Discharge planning Services     Post Acute Care Choice:    Choice offered to:     DME Arranged:    DME Agency:     HH Arranged:    HH Agency:     Status of Service:     If discussed at [employer], dates discussed:    Additional Comments:  Latanya Maudlin, RN 01/12/2018, 10:29 AM

## 2018-01-12 NOTE — Progress Notes (Signed)
Advanced Care Plan.  Purpose of Encounter: CODE STATUS. Parties in Attendance: The patient, his wife in the me. Patient's Decisional Capacity: Yes. Medical Story: Terry Tyler  is a 67 y.o. male with a known history of multiple medical problems including CHF, ESRD on hemodialysis, CAD, hypertension, diabetes, hyperlipidemia, DVT etc.  The patient is admitted for acute gastroenteritis, ascites and missed hemodialysis.  I discussed the patient current condition, prognosis and CODE STATUS.  The patient wants to be resuscitated and intubated if necessary.  Plan:  Code Status: Full code. Time spent discussing advance care planning: 17 minutes.

## 2018-01-12 NOTE — Progress Notes (Signed)
[facility]  ROUNDING NOTE   Subjective:  Patient has been experiencing nausea, vomiting, and diarrhea since Wednesday of this past week. He missed dialysis on Friday. He has significant abdominal distention.   Objective:  Vital signs in last 24 hours:  Temp:  [97.6 F (36.4 C)-99 F (37.2 C)] 97.6 F (36.4 C) (08/04 0331) Pulse Rate:  [82-88] 82 (08/04 0331) Resp:  [16-24] 24 (08/04 0331) BP: (103-115)/(70-74) 115/70 (08/04 0331) SpO2:  [95 %-98 %] 98 % (08/04 0331) Weight:  [93 kg (205 lb)-96.3 kg (212 lb 6.4 oz)] 96.3 kg (212 lb 6.4 oz) (08/04 0110)  Weight change:  Filed Weights   01/11/18 2108 01/12/18 0110  Weight: 93 kg (205 lb) 96.3 kg (212 lb 6.4 oz)    Intake/Output: I/O last 3 completed shifts: In: 300 [I.V.:300] Out: -    Intake/Output this shift:  Total I/O In: 240 [P.O.:240] Out: -   Physical Exam: General: No acute distress  Head: Normocephalic, atraumatic. Moist oral mucosal membranes  Eyes: Anicteric  Neck: Supple, trachea midline  Lungs:  Clear to auscultation, normal effort  Heart: S1S2 no rubs  Abdomen:  Soft, nontender, bowel sounds present, distended from ascites  Extremities: 2+ peripheral edema.  Neurologic: Awake, alert, following commands  Skin: No lesions  Access: LUE AVF    Basic Metabolic Panel: Recent Labs  Lab 01/10/18 0745 01/11/18 2118 01/12/18 0506  NA 139 135 134*  K 4.7 5.1 4.5  CL 98 95* 96*  CO2 29 25 23   GLUCOSE 194* 234* 236*  BUN 30* 43* 45*  CREATININE 5.85* 7.58* 7.83*  CALCIUM 8.3* 8.6* 8.6*    Liver Function Tests: Recent Labs  Lab 01/10/18 0745 01/11/18 2118  AST 47* 57*  ALT 26 22  ALKPHOS 294* 290*  BILITOT 2.5* 4.0*  PROT 7.9 8.6*  ALBUMIN 2.8* 2.9*   Recent Labs  Lab 01/11/18 2118  LIPASE 26   No results for input(s): AMMONIA in the last 168 hours.  CBC: Recent Labs  Lab 01/10/18 0745 01/11/18 2118 01/12/18 0506  WBC 11.3* 14.0* 14.3*  NEUTROABS  --  12.3*  --    HGB 9.5* 10.1* 9.4*  HCT 28.7* 30.9* 28.1*  MCV 80.8 82.0 81.3  PLT 178 175 191    Cardiac Enzymes: Recent Labs  Lab 01/10/18 0745 01/11/18 2118  TROPONINI 0.03* 0.05*    BNP: Invalid input(s): POCBNP  CBG: Recent Labs  Lab 01/12/18 0118 01/12/18 0753  GLUCAP 210* 89*    Microbiology: Results for orders placed or performed during the hospital encounter of 01/11/18  C difficile quick scan w PCR reflex     Status: None   Collection Time: 01/12/18  3:34 AM  Result Value Ref Range Status   C Diff antigen NEGATIVE NEGATIVE Final   C Diff toxin NEGATIVE NEGATIVE Final   C Diff interpretation No C. difficile detected.  Final    Comment: Performed at [facility], [address] [place], [place] [address]  MRSA PCR Screening     Status: None   Collection Time: 01/12/18  8:24 AM  Result Value Ref Range Status   MRSA by PCR NEGATIVE NEGATIVE Final    Comment:        The GeneXpert MRSA Assay (FDA approved for NASAL specimens only), is one component of a comprehensive MRSA colonization surveillance program. It is not intended to diagnose MRSA infection nor to guide or monitor treatment for MRSA infections. Performed at [facility], [phone]  [address] [place], [place] [address]     Coagulation Studies: Recent Labs    01/12/18 0506  LABPROT 30.4*  INR 2.94    Urinalysis: No results for input(s): COLORURINE, LABSPEC, PHURINE, GLUCOSEU, HGBUR, BILIRUBINUR, KETONESUR, PROTEINUR, UROBILINOGEN, NITRITE, LEUKOCYTESUR in the last 72 hours.  Invalid input(s): APPERANCEUR    Imaging: Ct Abdomen Pelvis W Contrast  Result Date: 01/11/2018 CLINICAL DATA:  Weakness.  Acute abdominal pain.  Fever. EXAM: CT ABDOMEN AND PELVIS WITH CONTRAST TECHNIQUE: Multidetector CT imaging of the abdomen and pelvis was performed using the standard protocol following bolus administration of intravenous contrast. CONTRAST:  135mL OMNIPAQUE IOHEXOL 300 MG/ML  SOLN  COMPARISON:  Most recent abdominal CT 11/25/2017 FINDINGS: Lower chest: The heart is enlarged. Coronary artery calcifications versus stents. Small left pleural effusion. Scattered linear atelectasis in both lower lobes. Hepatobiliary: Liver is enlarged. Tiny hypodensity in the right hepatic lobe is unchanged and may represent a cyst, too small to characterize. No suspicious enhancing lesion. Gallstone within the gallbladder without abnormal gallbladder distention. No biliary dilatation. Pancreas: Unremarkable. No pancreatic ductal dilatation or surrounding inflammatory changes. Spleen: Splenomegaly, spleen measures 15.9 cm cranial caudal. No focal abnormality. Adrenals/Urinary Tract: Bilateral adrenal thickening. Bilateral renal atrophy without hydronephrosis. Cyst in the posterior right kidney with additional partially calcified lesion, incompletely characterize but likely cyst. Cyst in the medial left kidney. Urinary bladder completely empty. Stomach/Bowel: Prominent intra-abdominal ascites and lack of enteric contrast limits bowel evaluation. Patulous distal esophagus. Stomach distended with air in fluid. Increased air throughout nondilated small bowel. Small volume of colonic stool. Normal appendix. Vascular/Lymphatic: Advanced aortic and branch atherosclerosis. No aneurysm. No bulky adenopathy. Multiple small retroperitoneal nodes are unchanged from prior exam. Reproductive: Prostate is unremarkable. Other: Large volume intra-abdominal ascites. Diffuse mesenteric edema and body wall edema. No free air. Musculoskeletal: Multilevel degenerative change in the spine. There are no acute or suspicious osseous abnormalities. IMPRESSION: 1. Large volume ascites, slightly increased in degree compared to CT 6 weeks prior. 2. Slight increased air within nondistended small bowel, nonspecific as to ileus versus enteritis. No evidence of primary bowel inflammation, however lack contrast and presence of ascites limits  assessment. No bowel obstruction. 3. Mild splenomegaly. 4. Unchanged chronic findings include calcified gallstone, atrophic kidneys consistent with chronic renal disease, and advanced Aortic Atherosclerosis (ICD10-I70.0). Electronically Signed   By: Jeb Levering M.D.   On: 01/11/2018 23:04   Dg Chest Portable 1 View  Result Date: 01/11/2018 CLINICAL DATA:  Weakness, end-stage renal disease on dialysis, hypotensive episode yesterday treated with IV fluids, persistent weakness today EXAM: PORTABLE CHEST 1 VIEW COMPARISON:  Portable exam 2313 hours compared to 11/04/2017 FINDINGS: Enlargement of cardiac silhouette post CABG. Mediastinal contours and pulmonary vascularity normal. Improved pulmonary vascular congestion since previous exam. Minimal RIGHT basilar atelectasis. No definite infiltrate, pleural effusion or pneumothorax. IMPRESSION: Enlargement of cardiac silhouette post CABG. Minimal RIGHT basilar atelectasis. Electronically Signed   By: Lavonia Dana M.D.   On: 01/11/2018 23:41     Medications:   . sodium chloride     . aspirin EC  81 mg Oral QHS  . gabapentin  300 mg Oral TID  . insulin aspart  0-5 Units Subcutaneous QHS  . insulin aspart  0-9 Units Subcutaneous TID WC  . multivitamin  1 tablet Oral Daily  . pantoprazole  40 mg Oral BID AC  . rosuvastatin  20 mg Oral QHS  . sodium chloride flush  3 mL Intravenous Q12H   sodium chloride, acetaminophen **OR**  acetaminophen, albuterol, HYDROcodone-acetaminophen, ondansetron **OR** ondansetron (ZOFRAN) IV, sodium chloride flush  Assessment/ Plan:  67 y.o.African American male end-stage renal disease, hemodialysis, hypertension, CABG, TIA, CVA, diabetes insulin-dependent, peripheral vascular disease, CHF EF 20-25%, ascites, admitted now with nausea, vomiting, diarrhea.   MWF/[facility]/LUE AVF  1.  ESRD on HD MWF.  Patient's last dialysis treatment was Wednesday as an outpatient.  Therefore we will proceed with  hemodialysis today with ultrafiltration target of 1.5 kg.  He will likely need another treatment tomorrow as well.  2.  Anemia of chronic kidney disease.  Hemoglobin currently 9.4.  Patient on Mircera as an outpatient.  3.  Secondary hyperparathyroidism.  Check intact PTH and phosphorus with dialysis today.  4.  Ascites.  Abdomen quite distended.  He required paracentesis last admission.  Defer this decision to hospitalist.  5.  Thanks for consultation.     LOS: 0 Areon Cocuzza 8/4/201910:46 AM

## 2018-01-12 NOTE — Progress Notes (Signed)
MD made aware that pt stated that he "can not take coumadin due to having a vascular surgery on Wednesday." Orders to hold coumadin at this time.   Micaella Gitto [employer]

## 2018-01-12 NOTE — Progress Notes (Signed)
Pre dialysis assessment 

## 2018-01-12 NOTE — Progress Notes (Signed)
This note also relates to the following rows which could not be included: Pulse Rate - Cannot attach notes to unvalidated device data Resp - Cannot attach notes to unvalidated device data BP - Cannot attach notes to unvalidated device data  Hd completed  

## 2018-01-12 NOTE — Progress Notes (Signed)
This note also relates to the following rows which could not be included: Pulse Rate - Cannot attach notes to unvalidated device data Resp - Cannot attach notes to unvalidated device data BP - Cannot attach notes to unvalidated device data  Hd started  

## 2018-01-12 NOTE — Progress Notes (Signed)
ANTICOAGULATION CONSULT NOTE - Initial Consult  Pharmacy Consult for warfarin Indication: VTE prophylaxis  Allergies  Allergen Reactions  . Lisinopril Swelling  . Atorvastatin Calcium Other (See Comments)    Muscle cramps  Muscle cramps  Other reaction(s): Other (See Comments), Other (See Comments), Unknown Muscle cramping Muscle cramps  . Ezetimibe Other (See Comments)    Cannot remember what the allergy was. Other reaction(s): Other (See Comments), Other (See Comments), Unknown Cannot remember what the allergy was.  . Lipitor [Atorvastatin] Other (See Comments)    Muscle cramping    Patient Measurements: Height: 6' (182.9 cm) Weight: 212 lb 6.4 oz (96.3 kg) IBW/kg (Calculated) : 77.6 Heparin Dosing Weight: 96.3 kg  Vital Signs: Temp: 97.6 F (36.4 C) (08/04 0331) Temp Source: Oral (08/04 0331) BP: 115/70 (08/04 0331) Pulse Rate: 82 (08/04 0331)  Labs: Recent Labs    01/10/18 0745 01/11/18 2118 01/12/18 0506  HGB 9.5* 10.1* 9.4*  HCT 28.7* 30.9* 28.1*  PLT 178 175 191  LABPROT  --   --  30.4*  INR  --   --  2.94  CREATININE 5.85* 7.58* 7.83*  TROPONINI 0.03* 0.05*  --     Estimated Creatinine Clearance: 11.3 mL/min (A) (by C-G formula based on SCr of 7.83 mg/dL (H)).   Medical History: Past Medical History:  Diagnosis Date  . C. difficile diarrhea   . CHF (congestive heart failure) ([place])   . CKD (chronic kidney disease)   . Coronary atherosclerosis of unspecified type of vessel, native or graft    Myocardial infarction in 2006. Cardiac catheterization showed significant three-vessel coronary artery disease. He underwent CABG at [facility]. Most recent cardiac catheterization in 2010 showed normal LV systolic function, patent grafts to OM1, RCA and LAD. Occluded SVG to diagonal.  . 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 Dialysis patient [facility])    Mon. Wed. Fri. for dialysis  . Hyperlipidemia   . Hypertension   . Hypertension,  renal disease   . Impotence of organic origin   . Keloid scar   . Myocardial infarction ([place]) 2006  . Neoplasm of uncertain behavior, site unspecified   . Peripheral vascular disease, unspecified ([place])   . Proteinuria   . Renal insufficiency   . Stroke [facility]) 04/2016   TIA  . Unspecified vitamin D deficiency     Medications:  Scheduled:  . aspirin EC  81 mg Oral QHS  . gabapentin  300 mg Oral TID  . insulin aspart  0-5 Units Subcutaneous QHS  . insulin aspart  0-9 Units Subcutaneous TID WC  . multivitamin  1 tablet Oral Daily  . pantoprazole  40 mg Oral BID AC  . rosuvastatin  20 mg Oral QHS  . sodium chloride flush  3 mL Intravenous Q12H    Assessment: Patient admitted for weakness w/ N/V/D x several days. Patient has no h/o afib or VTE, but supposedly patient is on warfarin for "VTE prophylaxis" patient states he is on warfarin for "a possible clot" (see ED note from 03/20). I could not find any other reasonable record for why this patient is on warfarin. Patient takes warfarin 5 mg daily  08/04 INR 2.94  Goal of Therapy:  INR goal 2 - 3; however, there is no clear indication for therapy Monitor platelets by anticoagulation protocol: Yes   Plan:  Considering patient takes warfarin 5 mg daily and is here for an acute illness and INR is borderline almost supratherapeutic with  no clear indication, will hold off one dose for tonight and will check INR w/ am labs to assess trend.  Tobie Lords, PharmD, BCPS Clinical Pharmacist 01/12/2018

## 2018-01-12 NOTE — Progress Notes (Signed)
ANTICOAGULATION CONSULT NOTE - Initial Consult  Pharmacy Consult for warfarin Indication: VTE prophylaxis post CVA in May and June and for history of AF  Allergies  Allergen Reactions  . Lisinopril Swelling  . Atorvastatin Calcium Other (See Comments)    Muscle cramps  Muscle cramps  Other reaction(s): Other (See Comments), Other (See Comments), Unknown Muscle cramping Muscle cramps  . Ezetimibe Other (See Comments)    Cannot remember what the allergy was. Other reaction(s): Other (See Comments), Other (See Comments), Unknown Cannot remember what the allergy was.  . Lipitor [Atorvastatin] Other (See Comments)    Muscle cramping    Patient Measurements: Height: 6' (182.9 cm) Weight: 212 lb 6.4 oz (96.3 kg) IBW/kg (Calculated) : 77.6 Heparin Dosing Weight: 96.3 kg  Vital Signs: Temp: 97.6 F (36.4 C) (08/04 0331) Temp Source: Oral (08/04 0331) BP: 115/70 (08/04 0331) Pulse Rate: 82 (08/04 0331)  Labs: Recent Labs    01/10/18 0745 01/11/18 2118 01/12/18 0506  HGB 9.5* 10.1* 9.4*  HCT 28.7* 30.9* 28.1*  PLT 178 175 191  LABPROT  --   --  30.4*  INR  --   --  2.94  CREATININE 5.85* 7.58* 7.83*  TROPONINI 0.03* 0.05*  --     Estimated Creatinine Clearance: 11.3 mL/min (A) (by C-G formula based on SCr of 7.83 mg/dL (H)).   Medical History: Past Medical History:  Diagnosis Date  . C. difficile diarrhea   . CHF (congestive heart failure) ([place])   . CKD (chronic kidney disease)   . Coronary atherosclerosis of unspecified type of vessel, native or graft    Myocardial infarction in 2006. Cardiac catheterization showed significant three-vessel coronary artery disease. He underwent [facility]G [facility]. Most recent cardiac catheterization in 2010 showed normal LV systolic function, patent grafts to OM1, RCA and LAD. Occluded SVG to diagonal.  . 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 Dialysis patient [facility])    Mon. Wed. Fri. for dialysis  .  Hyperlipidemia   . Hypertension   . Hypertension, renal disease   . Impotence of organic origin   . Keloid scar   . Myocardial infarction ([place]) 2006  . Neoplasm of uncertain behavior, site unspecified   . Peripheral vascular disease, unspecified ([place])   . Proteinuria   . Renal insufficiency   . Stroke [facility]) 04/2016   TIA  . Unspecified vitamin D deficiency     Medications:  Scheduled:  . aspirin EC  81 mg Oral QHS  . gabapentin  300 mg Oral TID  . insulin aspart  0-5 Units Subcutaneous QHS  . insulin aspart  0-9 Units Subcutaneous TID WC  . multivitamin  1 tablet Oral Daily  . pantoprazole  40 mg Oral BID AC  . rosuvastatin  20 mg Oral QHS  . sodium chloride flush  3 mL Intravenous Q12H    Assessment: Patient admitted for weakness w/ N/V/D x several days. Patient has h/o afib, CVA, and VTE Patient takes warfarin 5 mg daily  08/04 INR 2.94  Goal of Therapy:  INR goal 2 - 3; no clear indication for therapy Monitor platelets by anticoagulation protocol: Yes   Plan:  Continue warfarin 5 mg po daily. Will repeat INR tomorrow AM since INR high/normal.  Ovid Curd A. Jordan Hawks, PharmD, BCPS Clinical Pharmacist 01/12/2018

## 2018-01-12 NOTE — Progress Notes (Addendum)
[place] [facility] [place] NAME: Terry Tyler    MR#:  [id number]  DATE OF BIRTH:  12/26/1950  SUBJECTIVE:  CHIEF COMPLAINT:   Chief Complaint  Patient presents with  . Weakness  looks sick, vomiting, having abd pain and not comfortable REVIEW OF SYSTEMS:  Review of Systems  Constitutional: Positive for malaise/fatigue. Negative for chills, fever and weight loss.  HENT: Negative for nosebleeds and sore throat.   Eyes: Negative for blurred vision.  Respiratory: Negative for cough, shortness of breath and wheezing.   Cardiovascular: Negative for chest pain, orthopnea, leg swelling and PND.  Gastrointestinal: Positive for abdominal pain, diarrhea, nausea and vomiting. Negative for constipation and heartburn.  Genitourinary: Negative for dysuria and urgency.  Musculoskeletal: Negative for back pain.  Skin: Negative for rash.  Neurological: Negative for dizziness, speech change, focal weakness and headaches.  Endo/Heme/Allergies: Does not bruise/bleed easily.  Psychiatric/Behavioral: Negative for depression.    DRUG ALLERGIES:   Allergies  Allergen Reactions  . Lisinopril Swelling  . Atorvastatin Calcium Other (See Comments)    Muscle cramps  Muscle cramps  Other reaction(s): Other (See Comments), Other (See Comments), Unknown Muscle cramping Muscle cramps  . Ezetimibe Other (See Comments)    Cannot remember what the allergy was. Other reaction(s): Other (See Comments), Other (See Comments), Unknown Cannot remember what the allergy was.  . Lipitor [Atorvastatin] Other (See Comments)    Muscle cramping   VITALS:  Blood pressure 133/82, pulse 92, temperature 98.3 F (36.8 C), temperature source Oral, resp. rate 20, height 6' (1.829 m), weight 96.4 kg (212 lb 8 oz), SpO2 98 %. PHYSICAL EXAMINATION:  Physical Exam  Constitutional: He is oriented to person, place, and time.  HENT:  Head: Normocephalic and atraumatic.  Eyes: Pupils  are equal, round, and reactive to light. Conjunctivae and EOM are normal.  Neck: Normal range of motion. Neck supple. No tracheal deviation present. No thyromegaly present.  Cardiovascular: Normal rate, regular rhythm and normal heart sounds.  Pulmonary/Chest: Effort normal and breath sounds normal. No respiratory distress. He has no wheezes. He exhibits no tenderness.  Abdominal: Soft. Bowel sounds are normal. He exhibits distension, fluid wave and ascites. There is generalized tenderness.  Musculoskeletal: Normal range of motion.  Neurological: He is alert and oriented to person, place, and time. No cranial nerve deficit.  Skin: Skin is warm and dry. No rash noted.   LABORATORY PANEL:  Male CBC Recent Labs  Lab 01/12/18 0506  WBC 14.3*  HGB 9.4*  HCT 28.1*  PLT 191   ------------------------------------------------------------------------------------------------------------------ Chemistries  Recent Labs  Lab 01/11/18 2118 01/12/18 0506  NA 135 134*  K 5.1 4.5  CL 95* 96*  CO2 25 23  GLUCOSE 234* 236*  BUN 43* 45*  CREATININE 7.58* 7.83*  CALCIUM 8.6* 8.6*  AST 57*  --   ALT 22  --   ALKPHOS 290*  --   BILITOT 4.0*  --    RADIOLOGY:  Ct Abdomen Pelvis W Contrast  Result Date: 01/11/2018 CLINICAL DATA:  Weakness.  Acute abdominal pain.  Fever. EXAM: CT ABDOMEN AND PELVIS WITH CONTRAST TECHNIQUE: Multidetector CT imaging of the abdomen and pelvis was performed using the standard protocol following bolus administration of intravenous contrast. CONTRAST:  138mL OMNIPAQUE IOHEXOL 300 MG/ML  SOLN COMPARISON:  Most recent abdominal CT 11/25/2017 FINDINGS: Lower chest: The heart is enlarged. Coronary artery calcifications versus stents. Small left pleural effusion. Scattered linear atelectasis in both lower lobes.  Hepatobiliary: Liver is enlarged. Tiny hypodensity in the right hepatic lobe is unchanged and may represent a cyst, too small to characterize. No suspicious enhancing  lesion. Gallstone within the gallbladder without abnormal gallbladder distention. No biliary dilatation. Pancreas: Unremarkable. No pancreatic ductal dilatation or surrounding inflammatory changes. Spleen: Splenomegaly, spleen measures 15.9 cm cranial caudal. No focal abnormality. Adrenals/Urinary Tract: Bilateral adrenal thickening. Bilateral renal atrophy without hydronephrosis. Cyst in the posterior right kidney with additional partially calcified lesion, incompletely characterize but likely cyst. Cyst in the medial left kidney. Urinary bladder completely empty. Stomach/Bowel: Prominent intra-abdominal ascites and lack of enteric contrast limits bowel evaluation. Patulous distal esophagus. Stomach distended with air in fluid. Increased air throughout nondilated small bowel. Small volume of colonic stool. Normal appendix. Vascular/Lymphatic: Advanced aortic and branch atherosclerosis. No aneurysm. No bulky adenopathy. Multiple small retroperitoneal nodes are unchanged from prior exam. Reproductive: Prostate is unremarkable. Other: Large volume intra-abdominal ascites. Diffuse mesenteric edema and body wall edema. No free air. Musculoskeletal: Multilevel degenerative change in the spine. There are no acute or suspicious osseous abnormalities. IMPRESSION: 1. Large volume ascites, slightly increased in degree compared to CT 6 weeks prior. 2. Slight increased air within nondistended small bowel, nonspecific as to ileus versus enteritis. No evidence of primary bowel inflammation, however lack contrast and presence of ascites limits assessment. No bowel obstruction. 3. Mild splenomegaly. 4. Unchanged chronic findings include calcified gallstone, atrophic kidneys consistent with chronic renal disease, and advanced Aortic Atherosclerosis (ICD10-I70.0). Electronically Signed   By: Jeb Levering M.D.   On: 01/11/2018 23:04   Dg Chest Portable 1 View  Result Date: 01/11/2018 CLINICAL DATA:  Weakness, end-stage renal  disease on dialysis, hypotensive episode yesterday treated with IV fluids, persistent weakness today EXAM: PORTABLE CHEST 1 VIEW COMPARISON:  Portable exam 2313 hours compared to 11/04/2017 FINDINGS: Enlargement of cardiac silhouette post CABG. Mediastinal contours and pulmonary vascularity normal. Improved pulmonary vascular congestion since previous exam. Minimal RIGHT basilar atelectasis. No definite infiltrate, pleural effusion or pneumothorax. IMPRESSION: Enlargement of cardiac silhouette post CABG. Minimal RIGHT basilar atelectasis. Electronically Signed   By: Lavonia Dana M.D.   On: 01/11/2018 23:41   ASSESSMENT AND PLAN:  46 y m admitted for abd pain, n/v  * Suspected Acute gastroenteritis: c.diff neg, check stool studies. If no better after paracentesis - consider CT abd Nausea, vomiting and diarrhea and generalized weakness several days. Supportive care, Zofran as needed.  * Ascites worrisome for SBP: Paracentesis tomorrow. I talked on call radiology who suggested to wait till tomorrow as they don't feel this is an emergency (they normally don't do it on Sunday/weekends)  * Missed hemodialysis.  Nephrology following - HD today  * Hypertension.  Hold Lopressor due to low side blood pressure.  * Diabetes: continue sliding scale.  * History of DVT.  Continue Coumadin pharmacy to dose.       All the records are reviewed and case discussed with Care Management/Social Worker. Management plans discussed with the patient, family (Wife at bedside) and they are in agreement.  CODE STATUS: Full Code  TOTAL TIME TAKING CARE OF THIS PATIENT: 35 minutes.   More than 50% of the time was spent in counseling/coordination of care: YES  POSSIBLE D/C IN 2-3 DAYS, DEPENDING ON CLINICAL CONDITION.   Max Sane M.D on 01/12/2018 at 1:40 PM  Between 7am to 6pm - Pager - [phone]  After 6pm go to [email or web address] - Proofreader  [employer]   [phone]  CC: Primary care physician; Elisabeth Cara, NP  Note: This dictation was prepared with Dragon dictation along with smaller phrase technology. Any transcriptional errors that result from this process are unintentional.

## 2018-01-13 ENCOUNTER — Observation Stay: Payer: Medicare HMO

## 2018-01-13 DIAGNOSIS — R112 Nausea with vomiting, unspecified: Secondary | ICD-10-CM | POA: Diagnosis present

## 2018-01-13 DIAGNOSIS — E11621 Type 2 diabetes mellitus with foot ulcer: Secondary | ICD-10-CM | POA: Diagnosis present

## 2018-01-13 DIAGNOSIS — Z8673 Personal history of transient ischemic attack (TIA), and cerebral infarction without residual deficits: Secondary | ICD-10-CM | POA: Diagnosis not present

## 2018-01-13 DIAGNOSIS — E1152 Type 2 diabetes mellitus with diabetic peripheral angiopathy with gangrene: Secondary | ICD-10-CM | POA: Diagnosis present

## 2018-01-13 DIAGNOSIS — Z794 Long term (current) use of insulin: Secondary | ICD-10-CM | POA: Diagnosis not present

## 2018-01-13 DIAGNOSIS — N2581 Secondary hyperparathyroidism of renal origin: Secondary | ICD-10-CM | POA: Diagnosis present

## 2018-01-13 DIAGNOSIS — I252 Old myocardial infarction: Secondary | ICD-10-CM | POA: Diagnosis not present

## 2018-01-13 DIAGNOSIS — Z951 Presence of aortocoronary bypass graft: Secondary | ICD-10-CM | POA: Diagnosis not present

## 2018-01-13 DIAGNOSIS — E785 Hyperlipidemia, unspecified: Secondary | ICD-10-CM | POA: Diagnosis present

## 2018-01-13 DIAGNOSIS — I509 Heart failure, unspecified: Secondary | ICD-10-CM | POA: Diagnosis present

## 2018-01-13 DIAGNOSIS — N186 End stage renal disease: Secondary | ICD-10-CM | POA: Diagnosis present

## 2018-01-13 DIAGNOSIS — K529 Noninfective gastroenteritis and colitis, unspecified: Secondary | ICD-10-CM | POA: Diagnosis present

## 2018-01-13 DIAGNOSIS — Z86718 Personal history of other venous thrombosis and embolism: Secondary | ICD-10-CM | POA: Diagnosis not present

## 2018-01-13 DIAGNOSIS — E1122 Type 2 diabetes mellitus with diabetic chronic kidney disease: Secondary | ICD-10-CM | POA: Diagnosis present

## 2018-01-13 DIAGNOSIS — Z87891 Personal history of nicotine dependence: Secondary | ICD-10-CM | POA: Diagnosis not present

## 2018-01-13 DIAGNOSIS — Z7901 Long term (current) use of anticoagulants: Secondary | ICD-10-CM | POA: Diagnosis not present

## 2018-01-13 DIAGNOSIS — I2510 Atherosclerotic heart disease of native coronary artery without angina pectoris: Secondary | ICD-10-CM | POA: Diagnosis present

## 2018-01-13 DIAGNOSIS — K652 Spontaneous bacterial peritonitis: Secondary | ICD-10-CM | POA: Diagnosis present

## 2018-01-13 DIAGNOSIS — I70262 Atherosclerosis of native arteries of extremities with gangrene, left leg: Secondary | ICD-10-CM | POA: Diagnosis not present

## 2018-01-13 DIAGNOSIS — D631 Anemia in chronic kidney disease: Secondary | ICD-10-CM | POA: Diagnosis present

## 2018-01-13 DIAGNOSIS — Z7982 Long term (current) use of aspirin: Secondary | ICD-10-CM | POA: Diagnosis not present

## 2018-01-13 DIAGNOSIS — I132 Hypertensive heart and chronic kidney disease with heart failure and with stage 5 chronic kidney disease, or end stage renal disease: Secondary | ICD-10-CM | POA: Diagnosis present

## 2018-01-13 DIAGNOSIS — R188 Other ascites: Secondary | ICD-10-CM | POA: Diagnosis present

## 2018-01-13 DIAGNOSIS — Z955 Presence of coronary angioplasty implant and graft: Secondary | ICD-10-CM | POA: Diagnosis not present

## 2018-01-13 DIAGNOSIS — Z833 Family history of diabetes mellitus: Secondary | ICD-10-CM | POA: Diagnosis not present

## 2018-01-13 DIAGNOSIS — Z992 Dependence on renal dialysis: Secondary | ICD-10-CM | POA: Diagnosis not present

## 2018-01-13 LAB — BASIC METABOLIC PANEL
ANION GAP: 13 (ref 5–15)
BUN: 36 mg/dL — ABNORMAL HIGH (ref 8–23)
CALCIUM: 8.5 mg/dL — AB (ref 8.9–10.3)
CO2: 28 mmol/L (ref 22–32)
Chloride: 94 mmol/L — ABNORMAL LOW (ref 98–111)
Creatinine, Ser: 6.34 mg/dL — ABNORMAL HIGH (ref 0.61–1.24)
GFR, EST AFRICAN AMERICAN: 10 mL/min — AB (ref >60)
GFR, EST NON AFRICAN AMERICAN: 8 mL/min — AB (ref >60)
Glucose, Bld: 175 mg/dL — ABNORMAL HIGH (ref 70–99)
POTASSIUM: 3.8 mmol/L (ref 3.5–5.1)
Sodium: 135 mmol/L (ref 135–145)

## 2018-01-13 LAB — CBC
HCT: 27.3 % — ABNORMAL LOW (ref 40.0–52.0)
Hemoglobin: 9.2 g/dL — ABNORMAL LOW (ref 13.0–18.0)
MCH: 27.3 pg (ref 26.0–34.0)
MCHC: 33.9 g/dL (ref 32.0–36.0)
MCV: 80.3 fL (ref 80.0–100.0)
PLATELETS: 172 10*3/uL (ref 150–440)
RBC: 3.39 MIL/uL — AB (ref 4.40–5.90)
RDW: 18.5 % — AB (ref 11.5–14.5)
WBC: 12.2 10*3/uL — AB (ref 3.8–10.6)

## 2018-01-13 LAB — GLUCOSE, CAPILLARY
GLUCOSE-CAPILLARY: 163 mg/dL — AB (ref 70–99)
GLUCOSE-CAPILLARY: 196 mg/dL — AB (ref 70–99)
Glucose-Capillary: 215 mg/dL — ABNORMAL HIGH (ref 70–99)

## 2018-01-13 LAB — PROTIME-INR
INR: 2.81
Prothrombin Time: 29.4 seconds — ABNORMAL HIGH (ref 11.4–15.2)

## 2018-01-13 LAB — PHOSPHORUS: PHOSPHORUS: 4.4 mg/dL (ref 2.5–4.6)

## 2018-01-13 MED ORDER — METOPROLOL TARTRATE 25 MG PO TABS
12.5000 mg | ORAL_TABLET | Freq: Two times a day (BID) | ORAL | Status: DC
  Administered 2018-01-13: 12.5 mg via ORAL
  Filled 2018-01-13 (×4): qty 1

## 2018-01-13 MED ORDER — AMOXICILLIN-POT CLAVULANATE 500-125 MG PO TABS
500.0000 mg | ORAL_TABLET | ORAL | Status: DC
  Administered 2018-01-13 – 2018-01-15 (×3): 500 mg via ORAL
  Filled 2018-01-13 (×4): qty 1

## 2018-01-13 MED ORDER — AMOXICILLIN-POT CLAVULANATE 875-125 MG PO TABS: 1.0000 | ORAL_TABLET | Freq: Two times a day (BID) | ORAL | Status: DC

## 2018-01-13 NOTE — Progress Notes (Signed)
Post HD assessment    01/13/18 1527  Neurological  Level of Consciousness Alert  Orientation Level Oriented X4  Respiratory  Respiratory Pattern Regular;Unlabored  Chest Assessment Chest expansion symmetrical  Cardiac  ECG Monitor Yes  Ectopy  (Multiform PVCs)  Ectopy Frequency Frequent  Vascular  Edema Generalized;Right lower extremity;Left lower extremity  Integumentary  Integumentary (WDL) X  Skin Color Appropriate for ethnicity  Skin Integrity Cracking;Blister  Cracking Location Foot  Cracking Location Orientation Left  Musculoskeletal  Musculoskeletal (WDL) X  Generalized Weakness Yes  Assistive Device None  GU Assessment  Genitourinary (WDL) X  Genitourinary Symptoms  (HD)  Psychosocial  Psychosocial (WDL) WDL

## 2018-01-13 NOTE — Consult Note (Signed)
Patient recently seen by vascular and already scheduled for an angiogram on Wednesday.  We will proceed with angiogram Wednesday as scheduled

## 2018-01-13 NOTE — Progress Notes (Signed)
Pre HD assessment    01/13/18 1128  Neurological  Level of Consciousness Alert  Orientation Level Oriented X4  Respiratory  Respiratory Pattern Regular;Unlabored  Chest Assessment Chest expansion symmetrical  Cardiac  ECG Monitor Yes  Ectopy  (Multiform PVCs)  Ectopy Frequency Frequent  Vascular  Edema Generalized;Right lower extremity;Left lower extremity  Integumentary  Integumentary (WDL) X  Skin Color Appropriate for ethnicity  Skin Integrity Cracking;Blister  Cracking Location Foot  Cracking Location Orientation Left  Musculoskeletal  Musculoskeletal (WDL) X  Generalized Weakness Yes  Assistive Device None  GU Assessment  Genitourinary (WDL) X  Genitourinary Symptoms  (HD)  Psychosocial  Psychosocial (WDL) WDL

## 2018-01-13 NOTE — Progress Notes (Signed)
[facility]  ROUNDING NOTE   Subjective:   Seen and examined on hemodialysis. Tolerating treatment well. Last treatment was yesterday.   Large volume paracentesis this morning.   Objective:  Vital signs in last 24 hours:  Temp:  [97.2 F (36.2 C)-98.7 F (37.1 C)] 98.7 F (37.1 C) (08/05 0453) Pulse Rate:  [82-94] 85 (08/05 1043) Resp:  [13-23] 18 (08/05 1043) BP: (76-133)/(50-82) 107/60 (08/05 1043) SpO2:  [95 %-99 %] 96 % (08/05 1043) Weight:  [94.1 kg (207 lb 8 oz)-96.4 kg (212 lb 8 oz)] 94.8 kg (209 lb) (08/05 0453)  Weight change: 3.402 kg (7 lb 8 oz) Filed Weights   01/12/18 1245 01/12/18 1630 01/13/18 0453  Weight: 96.4 kg (212 lb 8 oz) 94.1 kg (207 lb 8 oz) 94.8 kg (209 lb)    Intake/Output: I/O last 3 completed shifts: In: 540 [P.O.:240; I.V.:300] Out: 1500 [Other:1500]   Intake/Output this shift:  No intake/output data recorded.  Physical Exam: General: No acute distress  Head: Normocephalic, atraumatic. Moist oral mucosal membranes  Eyes: Anicteric  Neck: Supple, trachea midline  Lungs:  Clear to auscultation, normal effort  Heart: S1S2 no rubs  Abdomen:  Soft, nontender, +ascites  Extremities: 1+ peripheral edema.  Neurologic: Awake, alert, following commands  Skin: No lesions  Access: LUE AVF    Basic Metabolic Panel: Recent Labs  Lab 01/10/18 0745 01/11/18 2118 01/12/18 0506 01/12/18 1316 01/13/18 0336  NA 139 135 134*  --  135  K 4.7 5.1 4.5  --  3.8  CL 98 95* 96*  --  94*  CO2 29 25 23   --  28  GLUCOSE 194* 234* 236*  --  175*  BUN 30* 43* 45*  --  36*  CREATININE 5.85* 7.58* 7.83*  --  6.34*  CALCIUM 8.3* 8.6* 8.6*  --  8.5*  PHOS  --   --   --  5.8* 4.4    Liver Function Tests: Recent Labs  Lab 01/10/18 0745 01/11/18 2118  AST 47* 57*  ALT 26 22  ALKPHOS 294* 290*  BILITOT 2.5* 4.0*  PROT 7.9 8.6*  ALBUMIN 2.8* 2.9*   Recent Labs  Lab 01/11/18 2118  LIPASE 26   No results for input(s): AMMONIA in the  last 168 hours.  CBC: Recent Labs  Lab 01/10/18 0745 01/11/18 2118 01/12/18 0506 01/13/18 0336  WBC 11.3* 14.0* 14.3* 12.2*  NEUTROABS  --  12.3*  --   --   HGB 9.5* 10.1* 9.4* 9.2*  HCT 28.7* 30.9* 28.1* 27.3*  MCV 80.8 82.0 81.3 80.3  PLT 178 175 191 172    Cardiac Enzymes: Recent Labs  Lab 01/10/18 0745 01/11/18 2118  TROPONINI 0.03* 0.05*    BNP: Invalid input(s): POCBNP  CBG: Recent Labs  Lab 01/12/18 0753 01/12/18 1206 01/12/18 1712 01/12/18 2142 01/13/18 0724  GLUCAP 239* 207* 131* 192* 163*    Microbiology: Results for orders placed or performed during the hospital encounter of 01/11/18  C difficile quick scan w PCR reflex     Status: None   Collection Time: 01/12/18  3:34 AM  Result Value Ref Range Status   C Diff antigen NEGATIVE NEGATIVE Final   C Diff toxin NEGATIVE NEGATIVE Final   C Diff interpretation No C. difficile detected.  Final    Comment: Performed at [facility], [address] [place], [place] [address]  MRSA PCR Screening     Status: None   Collection Time: 01/12/18  8:24  AM  Result Value Ref Range Status   MRSA by PCR NEGATIVE NEGATIVE Final    Comment:        The GeneXpert MRSA Assay (FDA approved for NASAL specimens only), is one component of a comprehensive MRSA colonization surveillance program. It is not intended to diagnose MRSA infection nor to guide or monitor treatment for MRSA infections. Performed at [facility], [address] [place], [place] [address]     Coagulation Studies: Recent Labs    01/12/18 0506 01/13/18 0336  LABPROT 30.4* 29.4*  INR 2.94 2.81    Urinalysis: No results for input(s): COLORURINE, LABSPEC, PHURINE, GLUCOSEU, HGBUR, BILIRUBINUR, KETONESUR, PROTEINUR, UROBILINOGEN, NITRITE, LEUKOCYTESUR in the last 72 hours.  Invalid input(s): APPERANCEUR    Imaging: Ct Abdomen Pelvis W Contrast  Result Date: 01/11/2018 CLINICAL DATA:  Weakness.  Acute abdominal  pain.  Fever. EXAM: CT ABDOMEN AND PELVIS WITH CONTRAST TECHNIQUE: Multidetector CT imaging of the abdomen and pelvis was performed using the standard protocol following bolus administration of intravenous contrast. CONTRAST:  157mL OMNIPAQUE IOHEXOL 300 MG/ML  SOLN COMPARISON:  Most recent abdominal CT 11/25/2017 FINDINGS: Lower chest: The heart is enlarged. Coronary artery calcifications versus stents. Small left pleural effusion. Scattered linear atelectasis in both lower lobes. Hepatobiliary: Liver is enlarged. Tiny hypodensity in the right hepatic lobe is unchanged and may represent a cyst, too small to characterize. No suspicious enhancing lesion. Gallstone within the gallbladder without abnormal gallbladder distention. No biliary dilatation. Pancreas: Unremarkable. No pancreatic ductal dilatation or surrounding inflammatory changes. Spleen: Splenomegaly, spleen measures 15.9 cm cranial caudal. No focal abnormality. Adrenals/Urinary Tract: Bilateral adrenal thickening. Bilateral renal atrophy without hydronephrosis. Cyst in the posterior right kidney with additional partially calcified lesion, incompletely characterize but likely cyst. Cyst in the medial left kidney. Urinary bladder completely empty. Stomach/Bowel: Prominent intra-abdominal ascites and lack of enteric contrast limits bowel evaluation. Patulous distal esophagus. Stomach distended with air in fluid. Increased air throughout nondilated small bowel. Small volume of colonic stool. Normal appendix. Vascular/Lymphatic: Advanced aortic and branch atherosclerosis. No aneurysm. No bulky adenopathy. Multiple small retroperitoneal nodes are unchanged from prior exam. Reproductive: Prostate is unremarkable. Other: Large volume intra-abdominal ascites. Diffuse mesenteric edema and body wall edema. No free air. Musculoskeletal: Multilevel degenerative change in the spine. There are no acute or suspicious osseous abnormalities. IMPRESSION: 1. Large volume  ascites, slightly increased in degree compared to CT 6 weeks prior. 2. Slight increased air within nondistended small bowel, nonspecific as to ileus versus enteritis. No evidence of primary bowel inflammation, however lack contrast and presence of ascites limits assessment. No bowel obstruction. 3. Mild splenomegaly. 4. Unchanged chronic findings include calcified gallstone, atrophic kidneys consistent with chronic renal disease, and advanced Aortic Atherosclerosis (ICD10-I70.0). Electronically Signed   By: Jeb Levering M.D.   On: 01/11/2018 23:04   Dg Chest Portable 1 View  Result Date: 01/11/2018 CLINICAL DATA:  Weakness, end-stage renal disease on dialysis, hypotensive episode yesterday treated with IV fluids, persistent weakness today EXAM: PORTABLE CHEST 1 VIEW COMPARISON:  Portable exam 2313 hours compared to 11/04/2017 FINDINGS: Enlargement of cardiac silhouette post CABG. Mediastinal contours and pulmonary vascularity normal. Improved pulmonary vascular congestion since previous exam. Minimal RIGHT basilar atelectasis. No definite infiltrate, pleural effusion or pneumothorax. IMPRESSION: Enlargement of cardiac silhouette post CABG. Minimal RIGHT basilar atelectasis. Electronically Signed   By: Lavonia Dana M.D.   On: 01/11/2018 23:41     Medications:   . sodium chloride     . amoxicillin-clavulanate  1 tablet Oral Q12H  . aspirin EC  81 mg Oral QHS  . Chlorhexidine Gluconate Cloth  6 each Topical Q0600  . ciprofloxacin  500 mg Oral Q24H  . gabapentin  300 mg Oral TID  . insulin aspart  0-5 Units Subcutaneous QHS  . insulin aspart  0-9 Units Subcutaneous TID WC  . multivitamin  1 tablet Oral Daily  . pantoprazole  40 mg Oral BID AC  . rosuvastatin  20 mg Oral QHS  . sodium chloride flush  3 mL Intravenous Q12H   sodium chloride, acetaminophen **OR** acetaminophen, albuterol, HYDROcodone-acetaminophen, ondansetron **OR** ondansetron (ZOFRAN) IV, sodium chloride flush  Assessment/  Plan:   [name] is a 67 y.o. black male with end stage renal disease on hemodialysis, diabetes mellitus type II, CVA, hypertension, peripheral vascular disease, congestive heart failure,   MWF/[facility]/LUE AVF  1. End stage renal disease on hemodialysis: seen and examined on hemodialysis treatment.  - Continue MWF schedule.   2.  Anemia of chronic kidney disease.  Hemoglobin 9.2. Patient on Mircera as an outpatient.  3.  Secondary hyperparathyroidism.  Not currently on binders as outpatient.   4.  Ascites.  Status post large volume paracentesis ultrasound guided this morning.   5.  Hypertension: blood pressure low. Monitor closely.    LOS: 0 Sereniti Wan 8/5/201911:30 AM

## 2018-01-13 NOTE — Progress Notes (Addendum)
[facility] [place] NAME: Terry Tyler    MR#:  [id number]  DATE OF BIRTH:  [birth year]  SUBJECTIVE:  Abdominal pain is improving. States he now feels abdominal "pressure" that's worse with laying down. Had some nausea this morning. Last BM was yesterday and was diarrhea.  REVIEW OF SYSTEMS:  Review of Systems  Constitutional: Positive for malaise/fatigue. Negative for chills, fever and weight loss.  HENT: Negative for nosebleeds and sore throat.   Eyes: Negative for blurred vision.  Respiratory: Negative for cough, shortness of breath and wheezing.   Cardiovascular: Positive for leg swelling. Negative for chest pain, orthopnea and PND.  Gastrointestinal: Positive for abdominal pain, diarrhea, nausea and vomiting. Negative for constipation and heartburn.  Genitourinary: Negative for dysuria and urgency.  Musculoskeletal: Negative for back pain.  Skin: Negative for rash.  Neurological: Positive for weakness. Negative for dizziness, speech change, focal weakness and headaches.  Endo/Heme/Allergies: Does not bruise/bleed easily.  Psychiatric/Behavioral: Negative for depression.    DRUG ALLERGIES:   Allergies  Allergen Reactions  . Lisinopril Swelling  . Atorvastatin Calcium Other (See Comments)    Muscle cramps  Muscle cramps  Other reaction(s): Other (See Comments), Other (See Comments), Unknown Muscle cramping Muscle cramps  . Ezetimibe Other (See Comments)    Cannot remember what the allergy was. Other reaction(s): Other (See Comments), Other (See Comments), Unknown Cannot remember what the allergy was.  . Lipitor [Atorvastatin] Other (See Comments)    Muscle cramping   VITALS:  Blood pressure 97/65, pulse 87, temperature 98.7 F (37.1 C), temperature source Oral, resp. rate 20, height 6' (1.829 m), weight 94.8 kg (209 lb), SpO2 96 %. PHYSICAL EXAMINATION:  Physical Exam  Constitutional: He is oriented to person, place,  and time.  HENT:  Head: Normocephalic and atraumatic.  Eyes: Pupils are equal, round, and reactive to light. Conjunctivae and EOM are normal.  Neck: Normal range of motion. Neck supple. No tracheal deviation present. No thyromegaly present.  Cardiovascular: Normal rate, regular rhythm and normal heart sounds.  Pulmonary/Chest: Effort normal and breath sounds normal. No respiratory distress. He has no wheezes. He exhibits no tenderness.  Abdominal: Soft. Bowel sounds are normal. He exhibits distension, fluid wave and ascites. There is generalized tenderness.  Musculoskeletal: Normal range of motion.  Neurological: He is alert and oriented to person, place, and time. No cranial nerve deficit.  Skin: Skin is warm and dry. No rash noted.   LABORATORY PANEL:  Male CBC Recent Labs  Lab 01/13/18 0336  WBC 12.2*  HGB 9.2*  HCT 27.3*  PLT 172   ------------------------------------------------------------------------------------------------------------------ Chemistries  Recent Labs  Lab 01/11/18 2118  01/13/18 0336  NA 135   < > 135  K 5.1   < > 3.8  CL 95*   < > 94*  CO2 25   < > 28  GLUCOSE 234*   < > 175*  BUN 43*   < > 36*  CREATININE 7.58*   < > 6.34*  CALCIUM 8.6*   < > 8.5*  AST 57*  --   --   ALT 22  --   --   ALKPHOS 290*  --   --   BILITOT 4.0*  --   --    < > = values in this interval not displayed.   RADIOLOGY:  No results found. ASSESSMENT AND PLAN:  63 y m admitted for abd pain, n/v  *Suspected acute gastroenteritis: abdominal pain has  improved. Last episode of diarrhea was yesterday. - plan for paracentesis today- if abdominal pain persists after paracentesis, may need to consider CT abdomen vs CTA to rule out other etiologies - c diff negative - norovirus pcr pending - zofran as needed  *Ascites- concern for SBP given elevated WBC count (elevated WBC count could also be due to foot wound). Afebrile and hemodynamically stable. - paracentesis today - will  follow-up fluid studies - cipro daily for suspected SBP  *Left foot wound- located between 3rd and 4th toes with foul smell - add augmentin in addition to cipro to cover for diabetic foot ulcer (already on cipro for suspected SBP) - wound care following - left lower extremity angiography scheduled for 8/7 as an outpatient  *ESRD on HD - received HD 8/4 due to missed session, will receive HD today to keep on m/w/f schedule  * Hypertension- BP low this morning - holding lopressor for now, will restart as able.  * Diabetes: blood sugars well-controlled. - continue sliding scale.  * History of DVT- no signs of acute DVT - holding coumadin for upcoming outpatient lower extremity angiography on 8/7   All the records are reviewed and case discussed with Care Management/Social Worker. Management plans discussed with the patient, family (Terry Tyler at bedside) and they are in agreement.  CODE STATUS: Full Code  TOTAL TIME TAKING CARE OF THIS PATIENT: 30 minutes.   More than 50% of the time was spent in counseling/coordination of care: YES  POSSIBLE D/C IN 2-3 DAYS, DEPENDING ON CLINICAL CONDITION.   Berna Spare Jerome Viglione M.D on 01/13/2018 at 9:16 AM  Between 7am to 6pm - Pager - [phone]  After 6pm go to [email or web address] - Proofreader  [facility]  Office  [phone]  CC: Primary care physician; Elisabeth Cara, NP  Note: This dictation was prepared with Dragon dictation along with smaller phrase technology. Any transcriptional errors that result from this process are unintentional.

## 2018-01-13 NOTE — Procedures (Signed)
US guided paracentesis.  Removed 5 liters of dark yellow fluid from LLQ.  Minimal blood loss and no immediate complication.

## 2018-01-13 NOTE — Progress Notes (Signed)
Pre HD assessment   01/13/18 1127  Vital Signs  Temp 98.5 F (36.9 C)  Temp Source Oral  Pulse Rate 86  Pulse Rate Source Monitor  Resp 14  BP 103/69  BP Location Right Arm  BP Method Automatic  Patient Position (if appropriate) Lying  Oxygen Therapy  SpO2 97 %  O2 Device Room Air  Pain Assessment  Pain Scale 0-10  Pain Score 8  Pain Type Surgical pain  Pain Location Abdomen  Pain Orientation Left  Pain Descriptors / Indicators Aching  Patients Stated Pain Goal 0  Pain Intervention(s) RN made aware  Dialysis Weight  Weight 92.6 kg (204 lb 2.3 oz)  Type of Weight Pre-Dialysis  Time-Out for Hemodialysis  What Procedure? HD  Pt Identifiers(min of two) First/Last Name;MRN/Account#  Correct Site? Yes  Correct Side? Yes  Correct Procedure? Yes  Consents Verified? Yes  Rad Studies Available? N/A  Safety Precautions Reviewed? Yes  Engineer, civil (consulting) Number  ([id number])  Station Number 1  UF/Alarm Test Passed  Conductivity: Meter 14  Conductivity: Machine  14.2  pH 7.6  Reverse Osmosis main  Normal Saline Lot Number [id number]  Dialyzer Lot Number [id number]  Disposable Set Lot Number [id number]  Machine Temperature 97.7 F (36.5 C)  Musician and Audible Yes  Blood Lines Intact and Secured Yes  Pre Treatment Patient Checks  Vascular access used during treatment Fistula  Hepatitis B Surface Antigen Results Negative  Date Hepatitis B Surface Antigen Drawn 11/25/17  Hepatitis B Surface Antibody  (>10)  Date Hepatitis B Surface Antibody Drawn 08/14/17  Hemodialysis Consent Verified Yes  Hemodialysis Standing Orders Initiated Yes  ECG (Telemetry) Monitor On Yes  Prime Ordered Normal Saline  Length of  DialysisTreatment -hour(s) 3.5 Hour(s)  Dialyzer Elisio 17H NR  Dialysate 3K, 2.5 Ca  Dialysis Anticoagulant None  Dialysate Flow Ordered 600  Blood Flow Rate Ordered 400 mL/min  Ultrafiltration Goal 1.5 Liters  Pre Treatment Labs Renal panel;CBC  Dialysis Blood  Pressure Support Ordered Normal Saline  Education / Care Plan  Dialysis Education Provided Yes  Documented Education in Care Plan Yes  Fistula / Graft Left Forearm  Placement Date/Time: 11/25/17 1307   Placed prior to admission: Yes  Orientation: Left  Access Location: Forearm  Site Condition No complications  Fistula / Graft Assessment Present;Thrill;Bruit  Drainage Description None

## 2018-01-13 NOTE — Progress Notes (Signed)
Post HD assessment. Pt tolerated tx well without c/o or complications. Net UF 1512,goal met.    01/13/18 1525  Vital Signs  Temp 97.7 F (36.5 C)  Temp Source Oral  Pulse Rate 88  Pulse Rate Source Monitor  Resp 12  BP 116/70  BP Location Right Arm  BP Method Automatic  Patient Position (if appropriate) Lying  Oxygen Therapy  SpO2 100 %  O2 Device Room Air  Pain Assessment  Pain Scale 0-10  Pain Score 0  Dialysis Weight  Weight 91.6 kg (201 lb 15.1 oz)  Type of Weight Post-Dialysis  Post-Hemodialysis Assessment  Rinseback Volume (mL) 250 mL  KECN 77.7 V  Dialyzer Clearance Lightly streaked  Duration of HD Treatment -hour(s) 3.5 hour(s)  Hemodialysis Intake (mL) 500 mL  UF Total -Machine (mL) 2012 mL  Net UF (mL) 1512 mL  Tolerated HD Treatment Yes  AVG/AVF Arterial Site Held (minutes) 10 minutes  AVG/AVF Venous Site Held (minutes) 10 minutes  Education / Care Plan  Dialysis Education Provided Yes  Documented Education in Care Plan Yes  Fistula / Graft Left Forearm  Placement Date/Time: 11/25/17 1307   Placed prior to admission: Yes  Orientation: Left  Access Location: Forearm  Site Condition No complications  Fistula / Graft Assessment Present;Thrill;Bruit  Status Deaccessed  Drainage Description None

## 2018-01-13 NOTE — Progress Notes (Signed)
HD tx start    01/13/18 1139  Vital Signs  Pulse Rate 88  Pulse Rate Source Monitor  Resp 15  BP 108/71  BP Location Right Arm  BP Method Automatic  Patient Position (if appropriate) Lying  Oxygen Therapy  SpO2 97 %  O2 Device Room Air  During Hemodialysis Assessment  Blood Flow Rate (mL/min) 400 mL/min  Arterial Pressure (mmHg) -170 mmHg  Venous Pressure (mmHg) 190 mmHg  Transmembrane Pressure (mmHg) 60 mmHg  Ultrafiltration Rate (mL/min) 570 mL/min  Dialysate Flow Rate (mL/min) 600 ml/min  Conductivity: Machine  14.2  HD Safety Checks Performed Yes  Dialysis Fluid Bolus Normal Saline  Bolus Amount (mL) 250 mL  Intra-Hemodialysis Comments Tx initiated  Fistula / Graft Left Forearm  Placement Date/Time: 11/25/17 1307   Placed prior to admission: Yes  Orientation: Left  Access Location: Forearm  Status Accessed  Needle Size 15

## 2018-01-13 NOTE — Consult Note (Signed)
[place] Nurse wound consult note Patient assessment completed in [facility] 209.  No family present. Reason for Consult: Left leg wound Wound type: The dorsum of the left foot at the juncture of the toes and the foot has a cracked area that the patient says started about a week ago as a blister.  Today this area is dry and no drainage noted.  Toes 3 and 4 of the left foot are dried and blackened.  There is a heavy amount of dried exudate between these toes.  When I pull them apart there is a heavy foul odor.  Also, the lower portion of the left leg is cold and black.  These areas appear consistent with arterial impairment.  I am unable to palpate a dorsalis or posterior tibial pulse to the extremity.  Of interest, the right foot is edematous.  The patient states he is to undergo a procedure Wednesday to evaluate the blood flow to the area.  Plan of care:  Cleanse the dorsum of the left foot and between toes 3 and 4 with betadine.  Be sure to remove all dried drainage.  Allow the betadine to air dry.  Place dry gauze over the top of the foot where the crusted area is, and place gauze between toes 3 & 4.  Secure with kerlex.  Change each shift.

## 2018-01-13 NOTE — Progress Notes (Signed)
HD tx end   01/13/18 1514  Vital Signs  Pulse Rate 91  Pulse Rate Source Monitor  Resp 13  BP 110/67  BP Location Right Arm  BP Method Automatic  Patient Position (if appropriate) Lying  Oxygen Therapy  SpO2 100 %  O2 Device Room Air  During Hemodialysis Assessment  Dialysis Fluid Bolus Normal Saline  Bolus Amount (mL) 250 mL  Intra-Hemodialysis Comments Tx completed

## 2018-01-13 NOTE — Progress Notes (Signed)
PHARMACY NOTE:  ANTIMICROBIAL RENAL DOSAGE ADJUSTMENT  Current antimicrobial regimen includes a mismatch between antimicrobial dosage and estimated renal function.  As per policy approved by the Pharmacy & Therapeutics and Medical Executive Committees, the antimicrobial dosage will be adjusted accordingly.  Current antimicrobial dosage:  Augmentin 875mg  q12h  Indication: foot infection  Renal Function:  Estimated Creatinine Clearance: 12.7 mL/min (A) (by C-G formula based on SCr of 6.34 mg/dL (H)). [x]      On intermittent HD, scheduled: []      On CRRT    Antimicrobial dosage has been changed to:  Augmentin 500mg  q24h  Additional comments: to be given after dialysis on HD days   Thank you for allowing pharmacy to be a part of this patient's care.  Paulina Fusi, PharmD, BCPS 01/13/2018 12:00 PM

## 2018-01-14 ENCOUNTER — Ambulatory Visit (INDEPENDENT_AMBULATORY_CARE_PROVIDER_SITE_OTHER): Payer: Medicare HMO | Admitting: Vascular Surgery

## 2018-01-14 LAB — RENAL FUNCTION PANEL
ALBUMIN: 2.5 g/dL — AB (ref 3.5–5.0)
Anion gap: 11 (ref 5–15)
BUN: 26 mg/dL — AB (ref 8–23)
CALCIUM: 8.3 mg/dL — AB (ref 8.9–10.3)
CO2: 28 mmol/L (ref 22–32)
CREATININE: 4.97 mg/dL — AB (ref 0.61–1.24)
Chloride: 97 mmol/L — ABNORMAL LOW (ref 98–111)
GFR calc Af Amer: 13 mL/min — ABNORMAL LOW (ref >60)
GFR calc non Af Amer: 11 mL/min — ABNORMAL LOW (ref >60)
GLUCOSE: 264 mg/dL — AB (ref 70–99)
PHOSPHORUS: 3.7 mg/dL (ref 2.5–4.6)
Potassium: 4.2 mmol/L (ref 3.5–5.1)
Sodium: 136 mmol/L (ref 135–145)

## 2018-01-14 LAB — GLUCOSE, CAPILLARY
GLUCOSE-CAPILLARY: 240 mg/dL — AB (ref 70–99)
GLUCOSE-CAPILLARY: 240 mg/dL — AB (ref 70–99)
Glucose-Capillary: 212 mg/dL — ABNORMAL HIGH (ref 70–99)
Glucose-Capillary: 221 mg/dL — ABNORMAL HIGH (ref 70–99)
Glucose-Capillary: 273 mg/dL — ABNORMAL HIGH (ref 70–99)

## 2018-01-14 LAB — CBC
HEMATOCRIT: 26.9 % — AB (ref 40.0–52.0)
Hemoglobin: 8.9 g/dL — ABNORMAL LOW (ref 13.0–18.0)
MCH: 26.8 pg (ref 26.0–34.0)
MCHC: 33.2 g/dL (ref 32.0–36.0)
MCV: 80.9 fL (ref 80.0–100.0)
PLATELETS: 185 10*3/uL (ref 150–440)
RBC: 3.32 MIL/uL — ABNORMAL LOW (ref 4.40–5.90)
RDW: 18 % — AB (ref 11.5–14.5)
WBC: 12 10*3/uL — ABNORMAL HIGH (ref 3.8–10.6)

## 2018-01-14 LAB — PROTIME-INR
INR: 2.44
PROTHROMBIN TIME: 26.3 s — AB (ref 11.4–15.2)

## 2018-01-14 LAB — PARATHYROID HORMONE, INTACT (NO CA): PTH: 378 pg/mL — AB (ref 15–65)

## 2018-01-14 MED ORDER — ONDANSETRON HCL 4 MG PO TABS: 4.0000 mg | ORAL_TABLET | Freq: Four times a day (QID) | ORAL | 0 refills | Status: AC | PRN

## 2018-01-14 MED ORDER — AMOXICILLIN-POT CLAVULANATE 500-125 MG PO TABS: 500.0000 mg | ORAL_TABLET | ORAL | 0 refills | Status: AC

## 2018-01-14 MED ORDER — METOPROLOL TARTRATE 25 MG PO TABS: 12.5000 mg | ORAL_TABLET | Freq: Two times a day (BID) | ORAL | 0 refills | Status: AC

## 2018-01-14 MED ORDER — CIPROFLOXACIN HCL 500 MG PO TABS: 500.0000 mg | ORAL_TABLET | ORAL | 0 refills | Status: AC

## 2018-01-14 NOTE — Progress Notes (Signed)
PT Cancellation Note  Patient Details Name: Terry Tyler MRN: [id number] DOB: December 28, 1950   Cancelled Treatment:    Reason Eval/Treat Not Completed: Other (comment).  Pt was attempted and declined PT due to feeling sick but in moderate pain on LE as well.  Will reattempt after angiogram tomorrow.   Ramond Dial 01/14/2018, 4:45 PM   Mee Hives, PT MS Acute Rehab Dept. Number: [place] [facility] [place]

## 2018-01-14 NOTE — Progress Notes (Signed)
[facility]  ROUNDING NOTE   Subjective:   Hemodialysis treatment yesterday. Tolerated treatment well. UF of 1512.   Family at bedside.   Objective:  Vital signs in last 24 hours:  Temp:  [97.7 F (36.5 C)-99.7 F (37.6 C)] 99.7 F (37.6 C) (08/06 0435) Pulse Rate:  [78-93] 78 (08/06 1022) Resp:  [11-20] 18 (08/06 0435) BP: (90-117)/(61-72) 93/65 (08/06 1022) SpO2:  [97 %-100 %] 99 % (08/06 0435) Weight:  [91.6 kg (201 lb 15.1 oz)-92.6 kg (204 lb 2.3 oz)] 91.6 kg (201 lb 15.1 oz) (08/05 1525)  Weight change: -3.789 kg (-8 lb 5.7 oz) Filed Weights   01/13/18 0453 01/13/18 1127 01/13/18 1525  Weight: 94.8 kg (209 lb) 92.6 kg (204 lb 2.3 oz) 91.6 kg (201 lb 15.1 oz)    Intake/Output: I/O last 3 completed shifts: In: 240 [P.O.:237; I.V.:3] Out: 1512 [Other:1512]   Intake/Output this shift:  No intake/output data recorded.  Physical Exam: General: No acute distress  Head: Normocephalic, atraumatic. Moist oral mucosal membranes  Eyes: Anicteric  Neck: Supple, trachea midline  Lungs:  Clear to auscultation, normal effort  Heart: S1S2 no rubs  Abdomen:  Soft, nontender, +ascites  Extremities: 1+ peripheral edema. Left foot in dressings,   Neurologic: Awake, alert, following commands  Skin: No lesions  Access: LUE AVF    Basic Metabolic Panel: Recent Labs  Lab 01/10/18 0745 01/11/18 2118 01/12/18 0506 01/12/18 1316 01/13/18 0336 01/14/18 0354  NA 139 135 134*  --  135 136  K 4.7 5.1 4.5  --  3.8 4.2  CL 98 95* 96*  --  94* 97*  CO2 29 25 23   --  28 28  GLUCOSE 194* 234* 236*  --  175* 264*  BUN 30* 43* 45*  --  36* 26*  CREATININE 5.85* 7.58* 7.83*  --  6.34* 4.97*  CALCIUM 8.3* 8.6* 8.6*  --  8.5* 8.3*  PHOS  --   --   --  5.8* 4.4 3.7    Liver Function Tests: Recent Labs  Lab 01/10/18 0745 01/11/18 2118 01/14/18 0354  AST 47* 57*  --   ALT 26 22  --   ALKPHOS 294* 290*  --   BILITOT 2.5* 4.0*  --   PROT 7.9 8.6*  --   ALBUMIN 2.8*  2.9* 2.5*   Recent Labs  Lab 01/11/18 2118  LIPASE 26   No results for input(s): AMMONIA in the last 168 hours.  CBC: Recent Labs  Lab 01/10/18 0745 01/11/18 2118 01/12/18 0506 01/13/18 0336 01/14/18 0354  WBC 11.3* 14.0* 14.3* 12.2* 12.0*  NEUTROABS  --  12.3*  --   --   --   HGB 9.5* 10.1* 9.4* 9.2* 8.9*  HCT 28.7* 30.9* 28.1* 27.3* 26.9*  MCV 80.8 82.0 81.3 80.3 80.9  PLT 178 175 191 172 185    Cardiac Enzymes: Recent Labs  Lab 01/10/18 0745 01/11/18 2118  TROPONINI 0.03* 0.05*    BNP: Invalid input(s): POCBNP  CBG: Recent Labs  Lab 01/13/18 0724 01/13/18 1658 01/13/18 2153 01/14/18 0002 01/14/18 0749  GLUCAP 163* 196* 215* 60* 240*    Microbiology: Results for orders placed or performed during the hospital encounter of 01/11/18  C difficile quick scan w PCR reflex     Status: None   Collection Time: 01/12/18  3:34 AM  Result Value Ref Range Status   C Diff antigen NEGATIVE NEGATIVE Final   C Diff toxin NEGATIVE NEGATIVE Final   C  Diff interpretation No C. difficile detected.  Final    Comment: Performed at [facility], [address] [place], [place] [address]  MRSA PCR Screening     Status: None   Collection Time: 01/12/18  8:24 AM  Result Value Ref Range Status   MRSA by PCR NEGATIVE NEGATIVE Final    Comment:        The GeneXpert MRSA Assay (FDA approved for NASAL specimens only), is one component of a comprehensive MRSA colonization surveillance program. It is not intended to diagnose MRSA infection nor to guide or monitor treatment for MRSA infections. Performed at [facility], [address] [place], [place] [address]     Coagulation Studies: Recent Labs    01/12/18 0506 01/13/18 0336 01/14/18 0354  LABPROT 30.4* 29.4* 26.3*  INR 2.94 2.81 2.44    Urinalysis: No results for input(s): COLORURINE, LABSPEC, PHURINE, GLUCOSEU, HGBUR, BILIRUBINUR, KETONESUR, PROTEINUR, UROBILINOGEN, NITRITE,  LEUKOCYTESUR in the last 72 hours.  Invalid input(s): APPERANCEUR    Imaging: US Paracentesis  Result Date: 01/13/2018 INDICATION: 67 year old with recurrent ascites. EXAM: ULTRASOUND GUIDED  PARACENTESIS MEDICATIONS: None. COMPLICATIONS: None immediate. PROCEDURE: Informed written consent was obtained from the patient after a discussion of the risks, benefits and alternatives to treatment. A timeout was performed prior to the initiation of the procedure. Initial ultrasound scanning demonstrates a large amount of ascites within the left lower abdominal quadrant. The left lower abdomen was prepped and draped in the usual sterile fashion. 1% lidocaine was used for local anesthesia. Following this, a 6 Fr Safe-T-Centesis catheter was introduced. An ultrasound image was saved for documentation purposes. The paracentesis was performed. The catheter was removed and a dressing was applied. The patient tolerated the procedure well without immediate post procedural complication. FINDINGS: A total of approximately 5 L of dark yellow fluid was removed. IMPRESSION: Successful ultrasound-guided paracentesis yielding 5 liters of peritoneal fluid. Electronically Signed   By: Markus Daft M.D.   On: 01/13/2018 12:46     Medications:   . sodium chloride     . amoxicillin-clavulanate  500 mg Oral Q24H  . aspirin EC  81 mg Oral QHS  . Chlorhexidine Gluconate Cloth  6 each Topical Q0600  . ciprofloxacin  500 mg Oral Q24H  . gabapentin  300 mg Oral TID  . insulin aspart  0-5 Units Subcutaneous QHS  . insulin aspart  0-9 Units Subcutaneous TID WC  . metoprolol tartrate  12.5 mg Oral BID  . multivitamin  1 tablet Oral Daily  . pantoprazole  40 mg Oral BID AC  . rosuvastatin  20 mg Oral QHS  . sodium chloride flush  3 mL Intravenous Q12H   sodium chloride, acetaminophen **OR** acetaminophen, albuterol, HYDROcodone-acetaminophen, ondansetron **OR** ondansetron (ZOFRAN) IV, sodium chloride flush  Assessment/ Plan:    [name] is a 67 y.o. black male with end stage renal disease on hemodialysis, diabetes mellitus type II, CVA, hypertension, peripheral vascular disease, congestive heart failure,   MWF/[facility]/LUE AVF  1. End stage renal disease on hemodialysis: tolerated hemodialysis treatment yesterday.  - Continue MWF schedule.   2.  Anemia of chronic kidney disease.  Hemoglobin 8.9. Patient on Mircera as an outpatient.  3.  Secondary hyperparathyroidism.  Not currently on binders as outpatient.   4.  Ascites.  Status post large volume paracentesis ultrasound guided yesterday, 8/5. Yield of 5 liters removed.   5.  Hypertension: blood pressure low. Monitor closely.   6. Peripheral vascular  disease - Appreciate vascular input. Angiogram for tomorrow. Left lower extremity with gangrene on examination   LOS: 1 Sheral Pfahler 8/6/201910:44 AM

## 2018-01-14 NOTE — Progress Notes (Signed)
Patient with extreme belching; denies nausea at this time; sitting up on bedside; Left foot pain easing. Barbaraann Faster, RN 3:14 AM 01/14/2018

## 2018-01-14 NOTE — Progress Notes (Signed)
[place] [facility] [place] NAME: Terry Tyler    MR#:  [id number]  DATE OF BIRTH:  [birth year]  SUBJECTIVE:  Patient states his abdominal pain continues to improve. Feeling much better after paracentesis yesterday. Abdominal "pressure" has resolved. He did have 1 episode of vomiting this morning. States he is having a lot of weakness and is unable to walk.  REVIEW OF SYSTEMS:  Review of Systems  Constitutional: Positive for malaise/fatigue. Negative for chills, fever and weight loss.  HENT: Negative for nosebleeds and sore throat.   Eyes: Negative for blurred vision.  Respiratory: Negative for cough, shortness of breath and wheezing.   Cardiovascular: Negative for chest pain, orthopnea, leg swelling and PND.  Gastrointestinal: Positive for diarrhea and vomiting. Negative for abdominal pain, constipation, heartburn and nausea.  Genitourinary: Negative for dysuria and urgency.  Musculoskeletal: Negative for back pain.  Skin: Negative for rash.  Neurological: Positive for weakness. Negative for dizziness, speech change, focal weakness and headaches.  Endo/Heme/Allergies: Does not bruise/bleed easily.  Psychiatric/Behavioral: Negative for depression.    DRUG ALLERGIES:   Allergies  Allergen Reactions  . Lisinopril Swelling  . Atorvastatin Calcium Other (See Comments)    Muscle cramps  Muscle cramps  Other reaction(s): Other (See Comments), Other (See Comments), Unknown Muscle cramping Muscle cramps  . Ezetimibe Other (See Comments)    Cannot remember what the allergy was. Other reaction(s): Other (See Comments), Other (See Comments), Unknown Cannot remember what the allergy was.  . Lipitor [Atorvastatin] Other (See Comments)    Muscle cramping   VITALS:  Blood pressure 93/65, pulse 78, temperature 99.7 F (37.6 C), temperature source Oral, resp. rate 18, height 6' (1.829 m), weight 91.6 kg (201 lb 15.1 oz), SpO2 99 %. PHYSICAL  EXAMINATION:  Physical Exam  Constitutional: He is oriented to person, place, and time.  HENT:  Head: Normocephalic and atraumatic.  Eyes: Pupils are equal, round, and reactive to light. Conjunctivae and EOM are normal.  Neck: Normal range of motion. Neck supple. No tracheal deviation present. No thyromegaly present.  Cardiovascular: Normal rate, regular rhythm and normal heart sounds.  Pulmonary/Chest: Effort normal and breath sounds normal. No respiratory distress. He has no wheezes. He exhibits no tenderness.  Abdominal: Soft. Bowel sounds are normal. He exhibits no distension, no fluid wave and no ascites. There is no tenderness.  Musculoskeletal: Normal range of motion.  Neurological: He is alert and oriented to person, place, and time. No cranial nerve deficit.  Skin: Skin is warm and dry. No rash noted.   LABORATORY PANEL:  Male CBC Recent Labs  Lab 01/14/18 0354  WBC 12.0*  HGB 8.9*  HCT 26.9*  PLT 185   ------------------------------------------------------------------------------------------------------------------ Chemistries  Recent Labs  Lab 01/11/18 2118  01/14/18 0354  NA 135   < > 136  K 5.1   < > 4.2  CL 95*   < > 97*  CO2 25   < > 28  GLUCOSE 234*   < > 264*  BUN 43*   < > 26*  CREATININE 7.58*   < > 4.97*  CALCIUM 8.6*   < > 8.3*  AST 57*  --   --   ALT 22  --   --   ALKPHOS 290*  --   --   BILITOT 4.0*  --   --    < > = values in this interval not displayed.   RADIOLOGY:  No results found. ASSESSMENT AND PLAN:  53 y m admitted for abd pain, n/v  *Suspected acute gastroenteritis: abdominal pain has improved. Last episode of diarrhea was yesterday. Still having a lot of weakness this morning and is unable to walk. - s/p paracentesis 8/5 with improvement in abdominal pain. - c diff negative - norovirus pcr pending - zofran as needed  *Ascites- resolved after paracentesis. Concern for SBP given elevated WBC count (elevated WBC count could also  be due to foot wound). Afebrile and hemodynamically stable. - cipro daily for suspected SBP  *Left foot wound- located between 3rd and 4th toes with foul smell - continue augmentin and cipro - wound care following - left lower extremity angiography scheduled for 8/7 as an outpatient- reached out to vascular surgery to see if they want to do this inpatient  Generalized weakness- states he is unable to walk today - HH orders placed for discharge  Dysphagia- patient states he has a lot of coughing with eating - SLP consult - may need outpatient GI f/u  *ESRD on HD - HD m/w/f  * Hypertension- BPs low this morning - lopressor held this morning  * Diabetes: blood sugars well-controlled. - continue sliding scale.  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 Management plans discussed with the patient, family (Wife at bedside) and they are in agreement.  CODE STATUS: Full Code  TOTAL TIME TAKING CARE OF THIS PATIENT: 30 minutes.   More than 50% of the time was spent in counseling/coordination of care: YES  POSSIBLE D/C likely tomorrow after angiogram, DEPENDING ON CLINICAL CONDITION.   Berna Spare Mayo M.D on 01/14/2018 at 11:04 AM  Between 7am to 6pm - Pager - [phone]  After 6pm go to [email or web address] - Proofreader  [facility]  Office  [phone]  CC: Primary care physician; Elisabeth Cara, NP  Note: This dictation was prepared with Dragon dictation along with smaller phrase technology. Any transcriptional errors that result from this process are unintentional.

## 2018-01-14 NOTE — Care Management Note (Addendum)
Case Management Note  Patient Details  Name: Terry Tyler MRN: [id number] Date of Birth: 07/17/1950    Patient to discharge home today.  Resumption orders have been placed.  [name] with [facility] notified of discharge. Elvera Bicker dialysis liaison notified of discharge. Patient states that he lives at home with his wife.  PCP Frederico Hamman.  His uncle provides transportation to and from HD.  Patient has RW and cane in the home for ambulation. Bedside nurse states that patient has expressed concerns about discharge.  Dr Brett Albino notified.   Subjective/Objective:                    Action/Plan:   Expected Discharge Date:  01/14/18               Expected Discharge Plan:  [place]  In-House Referral:     Discharge planning Services     Post Acute Care Choice:  Resumption of Svcs/PTA Provider Choice offered to:     DME Arranged:    DME Agency:     HH Arranged:  RN, PT, Nurse's Aide, OT HH Agency:  [facility]  Status of Service:  Completed, signed off  If discussed at [place] of Stay Meetings, dates discussed:    Additional Comments:  Beverly Sessions, RN 01/14/2018, 10:45 AM

## 2018-01-14 NOTE — Progress Notes (Signed)
Inpatient Diabetes Program Recommendations  AACE/ADA: New Consensus Statement on Inpatient Glycemic Control (2019)  Target Ranges:  Prepandial:   less than 140 mg/dL      Peak postprandial:   less than 180 mg/dL (1-2 hours)      Critically ill patients:  140 - 180 mg/dL  Results for Ballengee, EYAD ROCHFORD (MRN [id number]) as of 01/14/2018 09:15  Ref. Range 01/13/2018 07:24 01/13/2018 16:58 01/13/2018 21:53 01/14/2018 00:02 01/14/2018 07:49  Glucose-Capillary Latest Ref Range: 70 - 99 mg/dL 163 (H) 196 (H) 215 (H) 221 (H) 240 (H)   Results for Mcalpine, UDELL MAZZOCCO (MRN [id number]) as of 01/14/2018 09:15  Ref. Range 11/26/2017 03:51  Hemoglobin A1C Latest Ref Range: 4.8 - 5.6 % 6.7 (H)   Review of Glycemic Control  Diabetes history: DM2 Outpatient Diabetes medications: None Current orders for Inpatient glycemic control: Novolog 0-9 units TID with meals, Novolog 0-5 units QHS  Inpatient Diabetes Program Recommendations:  Insulin - Meal Coverage: While inpatient, please consider ordering Novolog 3 units TID with meals for meal coverage if patient eats at least 50% of meals. HgbA1C: A1C 6.7% on 11/26/17 indicating an average glucose of 146 mg/dl.  Thanks, Barnie Alderman, RN, MSN, CDE Diabetes Coordinator Inpatient Diabetes Program [phone] (Team Pager from 8am to 5pm)

## 2018-01-14 NOTE — Discharge Summary (Signed)
[facility] NAME: Terry Tyler    MR#:  [id number]  DATE OF BIRTH:  03-31-1951  DATE OF ADMISSION:  01/11/2018   ADMITTING PHYSICIAN: Demetrios Loll, MD  DATE OF DISCHARGE: 01/16/18  PRIMARY CARE PHYSICIAN: Elisabeth Cara, NP   ADMISSION DIAGNOSIS:  End stage renal disease ([place]) [N18.6] Other ascites [R18.8] Generalized weakness [R53.1] Ascites [R18.8] DISCHARGE DIAGNOSIS:  Active Problems:   Acute gastroenteritis  SECONDARY DIAGNOSIS:   Past Medical History:  Diagnosis Date  . C. difficile diarrhea   . CHF (congestive heart failure) ([place])   . CKD (chronic kidney disease)   . Coronary atherosclerosis of unspecified type of vessel, native or graft    Myocardial infarction in 2006. Cardiac catheterization showed significant three-vessel coronary artery disease. He underwent CABG at [facility]. Most recent cardiac catheterization in 2010 showed normal LV systolic function, patent grafts to OM1, RCA and LAD. Occluded SVG to diagonal.  . 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 Dialysis patient [facility])    Mon. Wed. Fri. for dialysis  . Hyperlipidemia   . Hypertension   . Hypertension, renal disease   . Impotence of organic origin   . Keloid scar   . Myocardial infarction ([place]) 2006  . Neoplasm of uncertain behavior, site unspecified   . Peripheral vascular disease, unspecified ([place])   . Proteinuria   . Renal insufficiency   . Stroke [facility]) 04/2016   TIA  . Unspecified vitamin D deficiency    HOSPITAL COURSE:   Terry Tyler is a 67 year old male with a PMH of HTN, diabetes, ESRD on HD, CAD with hx of MI, CHF, and hx TIA who presented to the ED with abdominal pain and distension. CT abdomen showed large volume ascites and increased air within a non-distended small bowel, consistent with ileus vs enteritis. He was admitted for further management.  Abdominal pain/distenstion- felt to be secondary to gastroenteritis and  ascites. Has had paracentesis in the past. - treated with supportive care and zofran prn - c.diff negative, norovirus pcr negative - underwent paracentesis with 5L of fluid removed - WBC count was mildly elevated in the 13s-14s but patient remained afebrile and hemodynamically stable. Treated with ciprofloxacin for suspected SBP - mesenteric ischemia considered, but patient's abdominal pain improved to a 1/10 after paracentesis  Left foot wound- located on the top of his foot and between the 3rd and 4th toes - treated with augmentin (in addition to already receiving cipro for presumed SBP) for a total 10 day course - underwent left lower extremity angiogram on 8/7 with angioplasty of the left posterior tibial artery, tibioperoneal trunk, mid to distal SFA and popliteal artery. Had stents placed to the distal SFA and proximal popliteal artery  Generalized weakness- felt to be secondary to deconditioning, as he has not ambulated much due to his left foot wound - evaluated by PT who recommended SNF  Dysphagia- endorsed a lot of coughing with eating - evaluated by SLP, who placed him on a dysphagia III diet - will need GI f/u for esophageal dysmotility and reflux symptoms  ESRD on HD - received HD M/W/F while hospitalized  History of DVT - held coumadin prior to lower extremity angiogram - coumadin restarted on 8/8   DISCHARGE CONDITIONS:  Stable, improved CONSULTS OBTAINED:  Treatment Team:  Anthonette Legato, MD Algernon Huxley, MD DRUG ALLERGIES:   Allergies  Allergen Reactions  . Lisinopril Swelling  .  Atorvastatin Calcium Other (See Comments)    Muscle cramps  Muscle cramps  Other reaction(s): Other (See Comments), Other (See Comments), Unknown Muscle cramping Muscle cramps  . Ezetimibe Other (See Comments)    Cannot remember what the allergy was. Other reaction(s): Other (See Comments), Other (See Comments), Unknown Cannot remember what the allergy was.  . Lipitor  [Atorvastatin] Other (See Comments)    Muscle cramping   DISCHARGE MEDICATIONS:   Allergies as of 01/14/2018      Reactions   Lisinopril Swelling   Atorvastatin Calcium Other (See Comments)   Muscle cramps Muscle cramps Other reaction(s): Other (See Comments), Other (See Comments), Unknown Muscle cramping Muscle cramps   Ezetimibe Other (See Comments)   Cannot remember what the allergy was. Other reaction(s): Other (See Comments), Other (See Comments), Unknown Cannot remember what the allergy was.   Lipitor [atorvastatin] Other (See Comments)   Muscle cramping      Medication List    STOP taking these medications   lactobacillus Pack     TAKE these medications   amoxicillin-clavulanate 500-125 MG tablet Commonly known as:  AUGMENTIN Take 1 tablet (500 mg total) by mouth daily.   aspirin 81 MG EC tablet Take 1 tablet (81 mg total) by mouth daily. What changed:  when to take this   ciprofloxacin 500 MG tablet Commonly known as:  CIPRO Take 1 tablet (500 mg total) by mouth daily.   gabapentin 300 MG capsule Commonly known as:  NEURONTIN Take 300 mg by mouth 3 (three) times daily.   metoprolol tartrate 25 MG tablet Commonly known as:  LOPRESSOR Take 0.5 tablets (12.5 mg total) by mouth 2 (two) times daily. What changed:    how much to take  when to take this   multivitamin Tabs tablet Take 1 tablet by mouth daily.   ondansetron 4 MG tablet Commonly known as:  ZOFRAN Take 1 tablet (4 mg total) by mouth every 6 (six) hours as needed for nausea.   pantoprazole 40 MG tablet Commonly known as:  PROTONIX Take 1 tablet (40 mg total) by mouth 2 (two) times daily before a meal.   rosuvastatin 20 MG tablet Commonly known as:  CRESTOR Take 20 mg by mouth at bedtime.   warfarin 5 MG tablet Commonly known as:  COUMADIN Take 1 tablet (5 mg total) by mouth at bedtime.       DISCHARGE INSTRUCTIONS:  1. Follow-up with PCP in 1-2 weeks 2. Follow-up with vascular  surgery in 1-2 weeks 3. Metoprolol tartrate dose decreased from 25mg  bid to 12.5mg  bid due to low BPs 4. Patient initially started on Cipro for suspected SBP. Then noted to have malodorous drainage from foot wound. Augmentin was added to Ciprofloxacin for a total 10 day course. Wound will likely improve after angiogram, which patient had done on 8/7 5. Patient complained of dysphagia. Seen by SLP who placed him on a dysphagia III diet and recommended GI f/u for evaluation of esophageal dysmotility. DIET:  Dysphagia III DISCHARGE CONDITION:  Stable ACTIVITY:  Activity as tolerated OXYGEN:  Home Oxygen: No.  Oxygen Delivery: room air DISCHARGE LOCATION:  nursing home   If you experience worsening of your admission symptoms, develop shortness of breath, life threatening emergency, suicidal or homicidal thoughts you must seek medical attention immediately by calling 911 or calling your MD immediately  if symptoms less severe.  You Must read complete instructions/literature along with all the possible adverse reactions/side effects for all the Medicines you take and  that have been prescribed to you. Take any new Medicines after you have completely understood and accpet all the possible adverse reactions/side effects.   Please note  You were cared for by a hospitalist during your hospital stay. If you have any questions about your discharge medications or the care you received while you were in the hospital after you are discharged, you can call the unit and asked to speak with the hospitalist on call if the hospitalist that took care of you is not available. Once you are discharged, your primary care physician will handle any further medical issues. Please note that NO REFILLS for any discharge medications will be authorized once you are discharged, as it is imperative that you return to your primary care physician (or establish a relationship with a primary care physician if you do not have one)  for your aftercare needs so that they can reassess your need for medications and monitor your lab values.    On the day of Discharge:  VITAL SIGNS:  Blood pressure 90/61, pulse 82, temperature 99.7 F (37.6 C), temperature source Oral, resp. rate 18, height 6' (1.829 m), weight 91.6 kg (201 lb 15.1 oz), SpO2 99 %. PHYSICAL EXAMINATION:  GENERAL:  67 y.o.-year-old patient lying in the bed with no acute distress.  EYES: Pupils equal, round, reactive to light and accommodation. No scleral icterus. Extraocular muscles intact.  HEENT: Head atraumatic, normocephalic. Oropharynx and nasopharynx clear.  NECK:  Supple, no jugular venous distention. No thyroid enlargement, no tenderness.  LUNGS: Normal breath sounds bilaterally, no wheezing, rales,rhonchi or crepitation. No use of accessory muscles of respiration.  CARDIOVASCULAR: S1, S2 normal. No murmurs, rubs, or gallops.  ABDOMEN: Soft, non-tender, non-distended. Bowel sounds present. No organomegaly or mass.  EXTREMITIES: No pedal edema, cyanosis, or clubbing.  NEUROLOGIC: Cranial nerves II through XII are intact. Global weakness present. PSYCHIATRIC: The patient is alert and oriented x 3.  SKIN: left foot wrapped in dry bandage DATA REVIEW:   CBC Recent Labs  Lab 01/14/18 0354  WBC 12.0*  HGB 8.9*  HCT 26.9*  PLT 185    Chemistries  Recent Labs  Lab 01/11/18 2118  01/14/18 0354  NA 135   < > 136  K 5.1   < > 4.2  CL 95*   < > 97*  CO2 25   < > 28  GLUCOSE 234*   < > 264*  BUN 43*   < > 26*  CREATININE 7.58*   < > 4.97*  CALCIUM 8.6*   < > 8.3*  AST 57*  --   --   ALT 22  --   --   ALKPHOS 290*  --   --   BILITOT 4.0*  --   --    < > = values in this interval not displayed.     Microbiology Results  Results for orders placed or performed during the hospital encounter of 01/11/18  C difficile quick scan w PCR reflex     Status: None   Collection Time: 01/12/18  3:34 AM  Result Value Ref Range Status   C Diff  antigen NEGATIVE NEGATIVE Final   C Diff toxin NEGATIVE NEGATIVE Final   C Diff interpretation No C. difficile detected.  Final    Comment: Performed at [facility], [address], [place], [place] [address]  MRSA PCR Screening     Status: None   Collection Time: 01/12/18  8:24 AM  Result Value Ref Range Status  MRSA by PCR NEGATIVE NEGATIVE Final    Comment:        The GeneXpert MRSA Assay (FDA approved for NASAL specimens only), is one component of a comprehensive MRSA colonization surveillance program. It is not intended to diagnose MRSA infection nor to guide or monitor treatment for MRSA infections. Performed at [facility], [address] [place], [place] [address]     RADIOLOGY:  US Paracentesis  Result Date: 01/13/2018 INDICATION: 67 year old with recurrent ascites. EXAM: ULTRASOUND GUIDED  PARACENTESIS MEDICATIONS: None. COMPLICATIONS: None immediate. PROCEDURE: Informed written consent was obtained from the patient after a discussion of the risks, benefits and alternatives to treatment. A timeout was performed prior to the initiation of the procedure. Initial ultrasound scanning demonstrates a large amount of ascites within the left lower abdominal quadrant. The left lower abdomen was prepped and draped in the usual sterile fashion. 1% lidocaine was used for local anesthesia. Following this, a 6 Fr Safe-T-Centesis catheter was introduced. An ultrasound image was saved for documentation purposes. The paracentesis was performed. The catheter was removed and a dressing was applied. The patient tolerated the procedure well without immediate post procedural complication. FINDINGS: A total of approximately 5 L of dark yellow fluid was removed. IMPRESSION: Successful ultrasound-guided paracentesis yielding 5 liters of peritoneal fluid. Electronically Signed   By: Markus Daft M.D.   On: 01/13/2018 12:46     Management plans discussed with the patient, family and  they are in agreement.  CODE STATUS: Full Code   TOTAL TIME TAKING CARE OF THIS PATIENT: 35 minutes.    Berna Spare Kamryn Gauthier M.D on 01/14/2018 at 9:25 AM  Between 7am to 6pm - Pager - [phone]  After 6pm go to [email or web address] - Proofreader  [facility]  Office  [phone]  CC: Primary care physician; Elisabeth Cara, NP   Note: This dictation was prepared with Dragon dictation along with smaller phrase technology. Any transcriptional errors that result from this process are unintentional.

## 2018-01-14 NOTE — Evaluation (Signed)
Clinical/Bedside Swallow Evaluation Patient Details  Name: Terry Tyler MRN: [id number] Date of Birth: 1951/03/23  Today's Date: 01/14/2018 Time: SLP Start Time (ACUTE ONLY): 72 SLP Stop Time (ACUTE ONLY): 1340 SLP Time Calculation (min) (ACUTE ONLY): 60 min  Past Medical History:  Past Medical History:  Diagnosis Date  . C. difficile diarrhea   . CHF (congestive heart failure) ([place])   . CKD (chronic kidney disease)   . Coronary atherosclerosis of unspecified type of vessel, native or graft    Myocardial infarction in 2006. Cardiac catheterization showed significant three-vessel coronary artery disease. He underwent CABG at [facility]. Most recent cardiac catheterization in 2010 showed normal LV systolic function, patent grafts to OM1, RCA and LAD. Occluded SVG to diagonal.  . 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 Dialysis patient [facility])    Mon. Wed. Fri. for dialysis  . Hyperlipidemia   . Hypertension   . Hypertension, renal disease   . Impotence of organic origin   . Keloid scar   . Myocardial infarction ([place]) 2006  . Neoplasm of uncertain behavior, site unspecified   . Peripheral vascular disease, unspecified ([place])   . Proteinuria   . Renal insufficiency   . Stroke [facility]) 04/2016   TIA  . Unspecified vitamin D deficiency    Past Surgical History:  Past Surgical History:  Procedure Laterality Date  . A/V FISTULAGRAM Left 01/03/2017   Procedure: A/V Fistulagram;  Surgeon: Algernon Huxley, MD;  Location: [place] [facility];  Service: Cardiovascular;  Laterality: Left;  . A/V FISTULAGRAM Left 06/06/2017   Procedure: A/V FISTULAGRAM;  Surgeon: Algernon Huxley, MD;  Location: [place] [facility];  Service: Cardiovascular;  Laterality: Left;  . A/V FISTULAGRAM Left 09/02/2017   Procedure: A/V FISTULAGRAM;  Surgeon: Algernon Huxley, MD;  Location: [place] [facility];  Service: Cardiovascular;  Laterality: Left;  . A/V SHUNT INTERVENTION N/A 01/03/2017   Procedure:  A/V Shunt Intervention;  Surgeon: Algernon Huxley, MD;  Location: [place] [facility];  Service: Cardiovascular;  Laterality: N/A;  . A/V SHUNT INTERVENTION N/A 06/06/2017   Procedure: A/V SHUNT INTERVENTION;  Surgeon: Algernon Huxley, MD;  Location: [place] [facility];  Service: Cardiovascular;  Laterality: N/A;  . AV FISTULA PLACEMENT Left 10/25/2016   Procedure: ARTERIOVENOUS (AV) FISTULA CREATION ( RADIOCEPHALIC );  Surgeon: Algernon Huxley, MD;  Location: [facility] ORS;  Service: Vascular;  Laterality: Left;  . CARDIAC CATHETERIZATION  12/2004   [facility]  . CARDIAC CATHETERIZATION  09/2008   [facility]  . COLONOSCOPY WITH PROPOFOL N/A 05/09/2017   Procedure: COLONOSCOPY WITH PROPOFOL;  Surgeon: Toledo, Benay Pike, MD;  Location: [facility] ENDOSCOPY;  Service: Gastroenterology;  Laterality: N/A;  . CORONARY ANGIOPLASTY    . CORONARY ARTERY BYPASS GRAFT  2006   [facility]  . CORONARY STENT INTERVENTION N/A 09/10/2016   Procedure: Coronary Stent Intervention;  Surgeon: Isaias Cowman, MD;  Location: [place] [facility];  Service: Cardiovascular;  Laterality: N/A;  . DIALYSIS/PERMA CATHETER REMOVAL N/A 02/14/2017   Procedure: DIALYSIS/PERMA CATHETER REMOVAL;  Surgeon: Algernon Huxley, MD;  Location: [place] [facility];  Service: Cardiovascular;  Laterality: N/A;  . ESOPHAGOGASTRODUODENOSCOPY (EGD) WITH PROPOFOL N/A 05/08/2017   Procedure: ESOPHAGOGASTRODUODENOSCOPY (EGD) WITH PROPOFOL;  Surgeon: Toledo, Benay Pike, MD;  Location: [facility] ENDOSCOPY;  Service: Gastroenterology;  Laterality: N/A;  . keloid removal     right neck  . LEFT HEART CATH AND CORONARY ANGIOGRAPHY N/A 09/10/2016  Procedure: Left Heart Cath and Coronary Angiography;  Surgeon: Isaias Cowman, MD;  Location: [place] [facility];  Service: Cardiovascular;  Laterality: N/A;   HPI:  Pt is a 67 y.o. male with PMH including ESRD on dialysis, CHF, CKD, C-diff, HTN who presents primarily with generalized weakness, gradual onset over the last  several days, and persistent today.  The patient was seen in the ED yesterday for this as well as hypotension noted when he was going for dialysis.  After his vital signs and symptoms improved he was sent home, however he states he did not end up going to dialysis today because he felt too weak.  Pt also reports nausea and several episodes of vomiting, as well as nearly continuous belching.  He reports worsening abdominal distention as well as some lower abdominal pain as well.  He denies shortness of breath or chest pain.  Currently, pt reports an ongoing issue w/ BELCHING for ~2 years and stated that he feels something "stuck in my throat when I eat a lot" - Globus.  Pt is unsure if has had REFLUX or GERD dx'd but does endorse using TUMS and Rolaids and Gasx over the years.  He continues to eat a regular diet at home. Pt is Edentulous thus impacting thorough mastication.    Assessment / Plan / Recommendation Clinical Impression  Pt appears to present w/ adequate oropharyngeal phase swallowing function w/ reduced risk for aspiration when following general aspiration precautions. However, pt appears to have a baseline of GI/Esophageal dysmotility and discomfort. Pt reported an ongoing issue w/ BELCHING for ~2 years and stated that he feels something "stuck in my throat when I eat a lot" - Globus. Pt is unsure if has had REFLUX or GERD dx'd but does endorse using TUMS and Rolaids and Gasx over the years. He continues to eat a regular diet at home. Pt is Edentulous thus impacting thorough mastication. Pt fed self trials of thin liquids via Straw and popcicles w/ no overt s/s of aspiration noted; no decline in vocal quality or respiratory status during/post trials. During the oral phase, pt exhibited timely oral phase management, swallowing and oral clearing w/ trials of liquids. No trials of solids were taken d/t pt waiting for his lunch meal. Discussed that d/t his Edentulous status, he probably needed increased  oral phase time for full and thorough mastication of the solids b/f swallowing and clearing. Pt endorsed need for increased mastication effort/time "sometimes". Discussed foods/options and preparation including less meat and bread together and use of condiments, soups, gravies to moisten foods. Recommend a Dysphagia level 3(mech soft) diet w/ thin liquids; recommend general aspiration precautions. Recommend f/u w/ GI for evaluation and management of Esophageal dysmotility and addressing any potential REFLUX issues. Pt could be at increased risk for aspiration of REFLUX Material if experiencing regurgitation or Reflux on a regular basis thus impacting his Pulmonary status. SLP Visit Diagnosis: Dysphagia, unspecified (R13.10)(Esophageal dysmotility)    Aspiration Risk  (reduced from an oropharyngeal phase point)    Diet Recommendation     Medication Administration: Whole meds with liquid    Other  Recommendations Recommended Consults: Consider GI evaluation;Consider esophageal assessment(education, management) Oral Care Recommendations: Oral care BID;Patient independent with oral care Other Recommendations: (n/a)   Follow up Recommendations None      Frequency and Duration (n/a)  (n/a)       Prognosis Prognosis for Safe Diet Advancement: Fair(-Good) Barriers to Reach Goals: Time post onset  Swallow Study   General Date of Onset: 01/11/18 HPI: Pt is a 67 y.o. male with PMH including ESRD on dialysis, CHF, CKD, C-diff, HTN who presents primarily with generalized weakness, gradual onset over the last several days, and persistent today.  The patient was seen in the ED yesterday for this as well as hypotension noted when he was going for dialysis.  After his vital signs and symptoms improved he was sent home, however he states he did not end up going to dialysis today because he felt too weak.  Pt also reports nausea and several episodes of vomiting, as well as nearly continuous belching.   He reports worsening abdominal distention as well as some lower abdominal pain as well.  He denies shortness of breath or chest pain.  Currently, pt reports an ongoing issue w/ BELCHING for ~2 years and stated that he feels something "stuck in my throat when I eat a lot" - Globus.  Pt is unsure if has had REFLUX or GERD dx'd but does endorse using TUMS and Rolaids and Gasx over the years.  He continues to eat a regular diet at home. Pt is Edentulous thus impacting thorough mastication.  Type of Study: Bedside Swallow Evaluation Previous Swallow Assessment: none  Diet Prior to this Study: Regular;Thin liquids Temperature Spikes Noted: No(wbc 12.0) Respiratory Status: Room air History of Recent Intubation: No Behavior/Cognition: Alert;Cooperative;Pleasant mood Oral Cavity Assessment: Within Functional Limits Oral Care Completed by SLP: Recent completion by staff Oral Cavity - Dentition: Edentulous Vision: Functional for self-feeding Self-Feeding Abilities: Able to feed self Patient Positioning: Upright in bed(EOB) Baseline Vocal Quality: Normal Volitional Cough: Strong Volitional Swallow: Able to elicit    Oral/Motor/Sensory Function Overall Oral Motor/Sensory Function: Within functional limits   Ice Chips Ice chips: Not tested   Thin Liquid Thin Liquid: Within functional limits Presentation: Self Fed;Straw(5-6 trials) Other Comments: also eating 2 popcicles    Nectar Thick Nectar Thick Liquid: Not tested   Honey Thick Honey Thick Liquid: Not tested   Puree Puree: Not tested   Solid     Solid: Not tested Other Comments: he was waiting on his Lunch meal (ordered)       [name], MS, CCC-SLP Watson,Katherine 01/14/2018,5:48 PM

## 2018-01-15 ENCOUNTER — Ambulatory Visit: Admission: RE | Admit: 2018-01-15 | Payer: Medicare HMO | Source: Ambulatory Visit | Admitting: Vascular Surgery

## 2018-01-15 ENCOUNTER — Encounter
Admission: EM | Disposition: A | Discharge status: Skilled Nursing Facility | Payer: Self-pay | Source: Home / Self Care | Attending: Internal Medicine

## 2018-01-15 DIAGNOSIS — I70262 Atherosclerosis of native arteries of extremities with gangrene, left leg: Secondary | ICD-10-CM

## 2018-01-15 HISTORY — PX: LOWER EXTREMITY ANGIOGRAPHY: CATH118251

## 2018-01-15 LAB — GLUCOSE, CAPILLARY
Glucose-Capillary: 185 mg/dL — ABNORMAL HIGH (ref 70–99)
Glucose-Capillary: 159 mg/dL — ABNORMAL HIGH (ref 70–99)
Glucose-Capillary: 138 mg/dL — ABNORMAL HIGH (ref 70–99)
Glucose-Capillary: 124 mg/dL — ABNORMAL HIGH (ref 70–99)

## 2018-01-15 LAB — CBC
HCT: 28.2 % — ABNORMAL LOW (ref 40.0–52.0)
HEMOGLOBIN: 9.4 g/dL — AB (ref 13.0–18.0)
MCH: 26.9 pg (ref 26.0–34.0)
MCHC: 33.2 g/dL (ref 32.0–36.0)
MCV: 81 fL (ref 80.0–100.0)
Platelets: 178 10*3/uL (ref 150–440)
RBC: 3.48 MIL/uL — AB (ref 4.40–5.90)
RDW: 18.2 % — ABNORMAL HIGH (ref 11.5–14.5)
WBC: 11.6 10*3/uL — ABNORMAL HIGH (ref 3.8–10.6)

## 2018-01-15 LAB — PROTIME-INR
INR: 2.02
Prothrombin Time: 22.7 seconds — ABNORMAL HIGH (ref 11.4–15.2)

## 2018-01-15 SURGERY — LOWER EXTREMITY ANGIOGRAPHY
Anesthesia: Moderate Sedation | Laterality: Left

## 2018-01-15 MED ORDER — CEFAZOLIN SODIUM-DEXTROSE 1-4 GM/50ML-% IV SOLN
1.0000 g | Freq: Once | INTRAVENOUS | Status: AC
  Administered 2018-01-15: 1 g via INTRAVENOUS

## 2018-01-15 MED ORDER — LIDOCAINE-EPINEPHRINE (PF) 1 %-1:200000 IJ SOLN
INTRAMUSCULAR | Status: AC
  Filled 2018-01-15: qty 30

## 2018-01-15 MED ORDER — SODIUM CHLORIDE 0.9 % IV SOLN
INTRAVENOUS | Status: AC | PRN
  Administered 2018-01-15: 250 mL via INTRAVENOUS

## 2018-01-15 MED ORDER — FENTANYL CITRATE (PF) 100 MCG/2ML IJ SOLN
INTRAMUSCULAR | Status: DC | PRN
  Administered 2018-01-15: 50 ug via INTRAVENOUS

## 2018-01-15 MED ORDER — HEPARIN SODIUM (PORCINE) 1000 UNIT/ML IJ SOLN
INTRAMUSCULAR | Status: AC
  Filled 2018-01-15: qty 1

## 2018-01-15 MED ORDER — MIDAZOLAM HCL 5 MG/5ML IJ SOLN
INTRAMUSCULAR | Status: AC
  Filled 2018-01-15: qty 5

## 2018-01-15 MED ORDER — HEPARIN SODIUM (PORCINE) 1000 UNIT/ML IJ SOLN
INTRAMUSCULAR | Status: DC | PRN
  Administered 2018-01-15: 5000 [IU] via INTRAVENOUS

## 2018-01-15 MED ORDER — NEPRO/CARBSTEADY PO LIQD
237.0000 mL | Freq: Two times a day (BID) | ORAL | Status: DC
  Administered 2018-01-15 – 2018-01-16 (×2): 237 mL via ORAL

## 2018-01-15 MED ORDER — SODIUM CHLORIDE 0.9 % IV SOLN
INTRAVENOUS | Status: DC
  Administered 2018-01-15: 15:00:00 via INTRAVENOUS

## 2018-01-15 MED ORDER — MIDAZOLAM HCL 2 MG/2ML IJ SOLN
INTRAMUSCULAR | Status: DC | PRN
  Administered 2018-01-15: 2 mg via INTRAVENOUS

## 2018-01-15 MED ORDER — IOPAMIDOL (ISOVUE-300) INJECTION 61%
INTRAVENOUS | Status: DC | PRN
  Administered 2018-01-15: 50 mL via INTRA_ARTERIAL

## 2018-01-15 MED ORDER — FENTANYL CITRATE (PF) 100 MCG/2ML IJ SOLN
INTRAMUSCULAR | Status: AC
  Filled 2018-01-15: qty 2

## 2018-01-15 MED ORDER — HEPARIN (PORCINE) IN NACL 1000-0.9 UT/500ML-% IV SOLN
INTRAVENOUS | Status: AC
  Filled 2018-01-15: qty 1000

## 2018-01-15 SURGICAL SUPPLY — 23 items
BALLN DORADO 6X200X135 (BALLOONS) ×3
BALLN LUTONIX 5X220X130 (BALLOONS) ×3
BALLN LUTONIX DCB 4X80X130 (BALLOONS) ×3
BALLN ULTRVRSE 3X150X150 (BALLOONS) ×3
BALLOON DORADO 6X200X135 (BALLOONS) ×1 IMPLANT
BALLOON LUTONIX 5X220X130 (BALLOONS) ×1 IMPLANT
BALLOON LUTONIX DCB 4X80X130 (BALLOONS) ×1 IMPLANT
BALLOON ULTRVRSE 3X150X150 (BALLOONS) ×1 IMPLANT
CATH BEACON 5 .038 100 VERT TP (CATHETERS) ×3 IMPLANT
CATH CXI SUPP ANG 4FR 135 (CATHETERS) ×1 IMPLANT
CATH CXI SUPP ANG 4FR 135CM (CATHETERS) ×3
CATH PIG 70CM (CATHETERS) ×3 IMPLANT
DEVICE PRESTO INFLATION (MISCELLANEOUS) ×3 IMPLANT
DEVICE STARCLOSE SE CLOSURE (Vascular Products) ×3 IMPLANT
GLIDEWIRE ADV .035X260CM (WIRE) ×3 IMPLANT
PACK ANGIOGRAPHY (CUSTOM PROCEDURE TRAY) ×3 IMPLANT
SHEATH ANL2 6FRX45 HC (SHEATH) ×3 IMPLANT
SHEATH BRITE TIP 5FRX11 (SHEATH) ×3 IMPLANT
STENT VIABAHN 6X150X120 (Permanent Stent) ×3 IMPLANT
SYR MEDRAD MARK V 150ML (SYRINGE) ×3 IMPLANT
TUBING CONTRAST HIGH PRESS 72 (TUBING) ×3 IMPLANT
WIRE G V18X300CM (WIRE) ×3 IMPLANT
WIRE J 3MM .035X145CM (WIRE) ×3 IMPLANT

## 2018-01-15 NOTE — Progress Notes (Signed)
OT Cancellation Note  Patient Details Name: DRAKE WUERTZ MRN: [id number] DOB: 1950-06-19   Cancelled Treatment:    Reason Eval/Treat Not Completed: Patient at procedure or test/ unavailable. Order received, chart reviewed. Pt out of room for procedure. Will re-attempt at later date/time as pt is available and medically appropriate.   Jeni Salles, MPH, MS, OTR/L ascom [phone] 01/15/18, 3:17 PM

## 2018-01-15 NOTE — Op Note (Addendum)
[facility]  Percutaneous Study/Intervention Procedural Note   Date of Surgery:  01/15/2018  Surgeon(s):,    Assistants:none  Pre-operative Diagnosis: PAD with gangrene left foot  Post-operative diagnosis:  Same  Procedure(s) Performed:             1.  Ultrasound guidance for vascular access right femoral artery             2.  Catheter placement into left posterior tibial artery from right femoral approach             3.  Aortogram and selective left lower extremity angiogram             4.  Percutaneous transluminal angioplasty of of the left posterior tibial artery and tibioperoneal trunk with 3 mm diameter angioplasty balloon and then of the tibioperoneal trunk with a 4 mm diameter Lutonix drug-coated angioplasty balloon             5.   Percutaneous transluminal angioplasty of left mid to distal SFA and popliteal artery with 2 inflations with a 5 mm diameter by 22 cm length Lutonix drug-coated angioplasty balloon  6.  Viabahn stent placement to the distal SFA and proximal popliteal artery with 6 mm diameter by 15 cm length stent for greater than 50% residual stenosis after angioplasty             7.  StarClose closure device right femoral artery  EBL: 10 cc  Contrast: 50 cc  Fluoro Time: 6.4 minutes  Moderate Conscious Sedation Time: approximately 45 minutes using 2 mg of Versed and 50 Mcg of Fentanyl              Indications:  Patient is a 67 y.o.male with gangrene of multiple toes on the left foot with poorly palpable pedal pulses. The patient is brought in for angiography for further evaluation and potential treatment.  Due to the limb threatening nature of the situation, angiogram was performed for attempted limb salvage. The patient is aware that if the procedure fails, amputation would be expected.  The patient also understands that even with successful revascularization, amputation may still be required due to the severity of the situation.   Risks and benefits are discussed and informed consent is obtained.   Procedure:  The patient was identified and appropriate procedural time out was performed.  The patient was then placed supine on the table and prepped and draped in the usual sterile fashion. Moderate conscious sedation was administered during a face to face encounter with the patient throughout the procedure with my supervision of the RN administering medicines and monitoring the patient's vital signs, pulse oximetry, telemetry and mental status throughout from the start of the procedure until the patient was taken to the recovery room. Ultrasound was used to evaluate the right common femoral artery.  It was patent but calcific.  A digital ultrasound image was acquired.  A Seldinger needle was used to access the right common femoral artery under direct ultrasound guidance and a permanent image was performed.  A 0.035 J wire was advanced without resistance and a 5Fr sheath was placed.  Pigtail catheter was placed into the aorta and an AP aortogram was performed. This demonstrated normal renal arteries with a flow was somewhat sluggish and normal aorta and iliac segments without significant stenosis. I then crossed the aortic bifurcation and advanced to the left femoral head. Selective left lower extremity angiogram was then performed. This demonstrated a normal common femoral  artery and profunda femoris artery.  In the mid segment of the SFA began diffuse disease with multiple areas of high-grade stenosis or occlusion throughout the remainder of the SFA and popliteal artery down to the below-knee popliteal artery.  The below-knee popliteal artery normalized just below the knee.  The tibioperoneal trunk then occluded in the posterior tibial artery reconstituted in the proximal segment and was the best runoff to the foot.  The anterior tibial artery occluded in the midsegment with some faint reconstitution distally.  The peroneal artery appeared to  be chronically occluded. The patient was systemically heparinized and a 6 [place] Ansell sheath was then placed over the [employer] wire. I then used a Kumpe catheter and the advantage wire to navigate through the SFA and popliteal stenoses and occlusions and confirmed intraluminal flow in the below-knee popliteal artery.  I then exchanged for a CXI catheter and cross the tibioperoneal trunk occlusion and confirmed intraluminal flow in the posterior tibial artery in the midsegment I then exchanged for a 0.018 wire and proceeded with treatment.  A 3 mm diameter by 15 cm length angioplasty balloon was inflated in the proximal posterior tibial artery and tibioperoneal trunk.  This was taken to 14 atm for 1 minute.  The popliteal artery from just below the knee up through the mid SFA were then treated with 2 inflations with a 5 mm diameter by 22 cm length Lutonix drug-coated angioplasty balloon.  These inflations were 10 to 12 atm for 1 minute.  Completion angiogram showed the distal SFA to have a calcific lesion and irregularity with greater than 75% stenosis.  The tibioperoneal trunk also had a greater than 50% stenosis.  I treated the tibioperoneal trunk lesion with a 4 mm diameter by 8 cm length Lutonix drug-coated angioplasty balloon inflated to 12 atm for 1 minute with result and less than 20% residual stenosis in the tibioperoneal trunk and the posterior tibial artery being continuous down to the foot.  The distal SFA and proximal popliteal artery were then treated with a 6 mm diameter by 15 cm length Viabahn stent postdilated with a 6 mm diameter high-pressure balloon with less than 10% residual stenosis. I elected to terminate the procedure. The sheath was removed and StarClose closure device was deployed in the right femoral artery with excellent hemostatic result. The patient was taken to the recovery room in stable condition having tolerated the procedure well.  Findings:               Aortogram:   Renal artery flow was somewhat sluggish but no proximal stenosis was identified.  The aorta and iliac arteries were widely patent without stenosis             Left lower Extremity:  Normal common femoral artery and profunda femoris artery.  In the mid segment of the SFA began diffuse disease with multiple areas of high-grade stenosis or occlusion throughout the remainder of the SFA and popliteal artery down to the below-knee popliteal artery.  The below-knee popliteal artery normalized just below the knee.  The tibioperoneal trunk then occluded in the posterior tibial artery reconstituted in the proximal segment and was the best runoff to the foot.  The anterior tibial artery occluded in the midsegment with some faint reconstitution distally.  The peroneal artery appeared to be chronically occluded   Disposition: Patient was taken to the recovery room in stable condition having tolerated the procedure well.  Complications: None  [name] 01/15/2018 3:54 PM  This note was created with Dragon Medical transcription system. Any errors in dictation are purely unintentional.

## 2018-01-15 NOTE — Progress Notes (Signed)
[facility]  ROUNDING NOTE   Subjective:   Mother at bedside.   Patient complains of right lower extremity pain.   Scheduled for angiogram later today.   Hemodialysis scheduled for this afternoon.   Objective:  Vital signs in last 24 hours:  Temp:  [98.5 F (36.9 C)-98.9 F (37.2 C)] 98.5 F (36.9 C) (08/07 0751) Pulse Rate:  [77-86] 85 (08/07 0758) Resp:  [17-20] 17 (08/07 0751) BP: (85-95)/(61-72) 90/67 (08/07 0758) SpO2:  [94 %-99 %] 94 % (08/07 0751)  Weight change:  Filed Weights   01/13/18 0453 01/13/18 1127 01/13/18 1525  Weight: 94.8 kg (209 lb) 92.6 kg (204 lb 2.3 oz) 91.6 kg (201 lb 15.1 oz)    Intake/Output: I/O last 3 completed shifts: In: 240 [P.O.:237; I.V.:3] Out: 0    Intake/Output this shift:  No intake/output data recorded.  Physical Exam: General: No acute distress  Head: Normocephalic, atraumatic. Moist oral mucosal membranes  Eyes: Anicteric  Neck: Supple, trachea midline  Lungs:  Clear to auscultation, normal effort  Heart: S1S2 no rubs  Abdomen:  Soft, nontender, +ascites  Extremities: 1+ peripheral edema. Left foot in dressings  Neurologic: Awake, alert, following commands  Skin: No lesions  Access: LUE AVF    Basic Metabolic Panel: Recent Labs  Lab 01/10/18 0745 01/11/18 2118 01/12/18 0506 01/12/18 1316 01/13/18 0336 01/14/18 0354  NA 139 135 134*  --  135 136  K 4.7 5.1 4.5  --  3.8 4.2  CL 98 95* 96*  --  94* 97*  CO2 29 25 23   --  28 28  GLUCOSE 194* 234* 236*  --  175* 264*  BUN 30* 43* 45*  --  36* 26*  CREATININE 5.85* 7.58* 7.83*  --  6.34* 4.97*  CALCIUM 8.3* 8.6* 8.6*  --  8.5* 8.3*  PHOS  --   --   --  5.8* 4.4 3.7    Liver Function Tests: Recent Labs  Lab 01/10/18 0745 01/11/18 2118 01/14/18 0354  AST 47* 57*  --   ALT 26 22  --   ALKPHOS 294* 290*  --   BILITOT 2.5* 4.0*  --   PROT 7.9 8.6*  --   ALBUMIN 2.8* 2.9* 2.5*   Recent Labs  Lab 01/11/18 2118  LIPASE 26   No results  for input(s): AMMONIA in the last 168 hours.  CBC: Recent Labs  Lab 01/11/18 2118 01/12/18 0506 01/13/18 0336 01/14/18 0354 01/15/18 0400  WBC 14.0* 14.3* 12.2* 12.0* 11.6*  NEUTROABS 12.3*  --   --   --   --   HGB 10.1* 9.4* 9.2* 8.9* 9.4*  HCT 30.9* 28.1* 27.3* 26.9* 28.2*  MCV 82.0 81.3 80.3 80.9 81.0  PLT 175 191 172 185 178    Cardiac Enzymes: Recent Labs  Lab 01/10/18 0745 01/11/18 2118  TROPONINI 0.03* 0.05*    BNP: Invalid input(s): POCBNP  CBG: Recent Labs  Lab 01/14/18 0749 01/14/18 1145 01/14/18 1641 01/14/18 2115 01/15/18 0747  GLUCAP 240* 240* 273* 212* 185*    Microbiology: Results for orders placed or performed during the hospital encounter of 01/11/18  C difficile quick scan w PCR reflex     Status: None   Collection Time: 01/12/18  3:34 AM  Result Value Ref Range Status   C Diff antigen NEGATIVE NEGATIVE Final   C Diff toxin NEGATIVE NEGATIVE Final   C Diff interpretation No C. difficile detected.  Final    Comment: Performed at [facility], [address] [place], [place] [address]  MRSA PCR Screening     Status: None   Collection Time: 01/12/18  8:24 AM  Result Value Ref Range Status   MRSA by PCR NEGATIVE NEGATIVE Final    Comment:        The GeneXpert MRSA Assay (FDA approved for NASAL specimens only), is one component of a comprehensive MRSA colonization surveillance program. It is not intended to diagnose MRSA infection nor to guide or monitor treatment for MRSA infections. Performed at [facility], [address] [place], [place] [address]     Coagulation Studies: Recent Labs    01/13/18 0336 01/14/18 0354 01/15/18 0400  LABPROT 29.4* 26.3* 22.7*  INR 2.81 2.44 2.02    Urinalysis: No results for input(s): COLORURINE, LABSPEC, PHURINE, GLUCOSEU, HGBUR, BILIRUBINUR, KETONESUR, PROTEINUR, UROBILINOGEN, NITRITE, LEUKOCYTESUR in the last 72 hours.  Invalid input(s): APPERANCEUR     Imaging: US Paracentesis  Result Date: 01/13/2018 INDICATION: 67 year old with recurrent ascites. EXAM: ULTRASOUND GUIDED  PARACENTESIS MEDICATIONS: None. COMPLICATIONS: None immediate. PROCEDURE: Informed written consent was obtained from the patient after a discussion of the risks, benefits and alternatives to treatment. A timeout was performed prior to the initiation of the procedure. Initial ultrasound scanning demonstrates a large amount of ascites within the left lower abdominal quadrant. The left lower abdomen was prepped and draped in the usual sterile fashion. 1% lidocaine was used for local anesthesia. Following this, a 6 Fr Safe-T-Centesis catheter was introduced. An ultrasound image was saved for documentation purposes. The paracentesis was performed. The catheter was removed and a dressing was applied. The patient tolerated the procedure well without immediate post procedural complication. FINDINGS: A total of approximately 5 L of dark yellow fluid was removed. IMPRESSION: Successful ultrasound-guided paracentesis yielding 5 liters of peritoneal fluid. Electronically Signed   By: Markus Daft M.D.   On: 01/13/2018 12:46     Medications:   . sodium chloride     . amoxicillin-clavulanate  500 mg Oral Q24H  . aspirin EC  81 mg Oral QHS  . ciprofloxacin  500 mg Oral Q24H  . feeding supplement (NEPRO CARB STEADY)  237 mL Oral BID BM  . gabapentin  300 mg Oral TID  . insulin aspart  0-5 Units Subcutaneous QHS  . insulin aspart  0-9 Units Subcutaneous TID WC  . metoprolol tartrate  12.5 mg Oral BID  . multivitamin  1 tablet Oral Daily  . pantoprazole  40 mg Oral BID AC  . rosuvastatin  20 mg Oral QHS  . sodium chloride flush  3 mL Intravenous Q12H   sodium chloride, acetaminophen **OR** acetaminophen, albuterol, HYDROcodone-acetaminophen, ondansetron **OR** ondansetron (ZOFRAN) IV, sodium chloride flush  Assessment/ Plan:   Terry Tyler is a 67 y.o. black male with end  stage renal disease on hemodialysis, diabetes mellitus type II, CVA, hypertension, peripheral vascular disease, congestive heart failure,   MWF/[facility]/LUE AVF  1. End stage renal disease on hemodialysis: Hemodialysis for after vascular procedure. - Continue MWF schedule.   2.  Anemia of chronic kidney disease.  Hemoglobin 9.4.  - Mircera as an outpatient.  3.  Secondary hyperparathyroidism.  Not currently on binders as outpatient.   4.  Ascites.  Status post large volume paracentesis ultrasound guided on 8/5. Yield of 5 liters removed.   5.  Hypertension: blood pressure low. Monitor closely. Home regimen is metoprolol.    6. Peripheral vascular disease: Left lower extremity with gangrene on  examination - Appreciate vascular input. Angiogram for later today.    LOS: 2 Terry Tyler 8/7/201910:19 AM

## 2018-01-15 NOTE — Evaluation (Signed)
Physical Therapy Evaluation Patient Details Name: Terry Tyler MRN: [id number] DOB: Oct 24, 1950 Today's Date: 01/15/2018   History of Present Illness  Pt is a29 y.o.malewith a known history of C-diff, CHF, CKD on HD, MI, CAD, DM, HTN, HLD, TIA, and PVD. The patient presented to the ED with complaints of weakness. In addition, he complained of abdominal pain and worsening distention. Pt came to ED and was found with hypotension. He missedhemodialysis due to nausea and vomiting.  Assessment includes: suspected acute gastroenteritis s/p paracentesis 8/5, ascites, L foot wound, weakness, dysphagia, ESRD on HD, HTN, DM, and h/o DVT.     Clinical Impression  Pt presents with deficits in strength, transfers, mobility, gait, balance, and activity tolerance.  Pt required min A for all bed mobility tasks and was unable to stand from the EOB at standard height.  With the EOB elevated significantly the pt was able to stand with min A but was only able to take 2-3 very short, shuffling steps at the EOB before requiring to return to sitting.  Overall pt appears to be significantly weaker functionally than at stated baseline and will benefit from PT services in a SNF setting upon discharge to safely address above deficits for decreased caregiver assistance and eventual return to PLOF.      Follow Up Recommendations SNF;Supervision for mobility/OOB    Equipment Recommendations  None recommended by PT    Recommendations for Other Services       Precautions / Restrictions Precautions Precautions: Fall Restrictions Weight Bearing Restrictions: No      Mobility  Bed Mobility Overal bed mobility: Needs Assistance Bed Mobility: Supine to Sit;Sit to Supine     Supine to sit: Min assist Sit to supine: Min assist   General bed mobility comments: Min A for both BLEs and upper body during sup to sit and for BLEs during sit to sup  Transfers Overall transfer level: Needs assistance Equipment  used: Rolling walker (2 wheeled) Transfers: Sit to/from Stand Sit to Stand: Min assist;From elevated surface         General transfer comment: Pt unable to stand from bed at standard height but was able to stand with min A from an elevated surface   Ambulation/Gait Ambulation/Gait assistance: Min assist Gait Distance (Feet): 1 Feet Assistive device: Rolling walker (2 wheeled) Gait Pattern/deviations: Trunk flexed;Step-to pattern;Shuffle     General Gait Details: Pt only able to take 2-3 shuffling steps at the EOB with heavy use of the RW and flexed trunk posture  Stairs            Wheelchair Mobility    Modified Rankin (Stroke Patients Only)       Balance Overall balance assessment: Needs assistance Sitting-balance support: Feet supported;Bilateral upper extremity supported Sitting balance-Leahy Scale: Good     Standing balance support: Bilateral upper extremity supported Standing balance-Leahy Scale: Fair                               Pertinent Vitals/Pain Pain Assessment: No/denies pain    Home Living Family/patient expects to be discharged to:: Private residence Living Arrangements: Spouse/significant other Available Help at Discharge: Family;Available PRN/intermittently Type of Home: Mobile home Home Access: Stairs to enter Entrance Stairs-Rails: Right;Left;Can reach both Entrance Stairs-Number of Steps: 4 Home Layout: One level Home Equipment: Walker - 2 wheels;Walker - 4 wheels;Cane - quad;Electric scooter Additional Comments: Pt lives in mobile home w/ spouse (provides very little assist  and works), 4 steps to enter with bilat rails, tub shower, and elevated toilet.    Prior Function Level of Independence: Needs assistance   Gait / Transfers Assistance Needed: Pt Mod Ind with amb with a QC in the home and uses an electric scooter in the community, min A up/down stairs, history of 3-4 falls/month but only one fall since June   ADL's /  Homemaking Assistance Needed: Pt requires assist for sponge bathing, LB dressing, and all IADL including med mgt        Hand Dominance   Dominant Hand: Right    Extremity/Trunk Assessment   Upper Extremity Assessment Upper Extremity Assessment: Overall WFL for tasks assessed    Lower Extremity Assessment Lower Extremity Assessment: Generalized weakness       Communication   Communication: No difficulties  Cognition Arousal/Alertness: Awake/alert Behavior During Therapy: WFL for tasks assessed/performed Overall Cognitive Status: Within Functional Limits for tasks assessed                                        General Comments      Exercises Total Joint Exercises Ankle Circles/Pumps: AROM;Both;10 reps;5 reps Quad Sets: Strengthening;Both;5 reps;10 reps Gluteal Sets: Strengthening;Both;5 reps;10 reps Heel Slides: AAROM;Both;5 reps Hip ABduction/ADduction: AAROM;Both;10 reps Straight Leg Raises: AAROM;Both;10 reps Long Arc Quad: AROM;Both;5 reps Knee Flexion: AROM;Both;5 reps Other Exercises Other Exercises: HEP for BLE APs, QS, LAQ, and GS x 10 each 5-6x/day   Assessment/Plan    PT Assessment Patient needs continued PT services  PT Problem List Decreased strength;Decreased activity tolerance;Decreased balance;Decreased mobility       PT Treatment Interventions DME instruction;Gait training;Stair training;Functional mobility training;Therapeutic exercise;Therapeutic activities;Balance training;Patient/family education    PT Goals (Current goals can be found in the Care Plan section)  Acute Rehab PT Goals Patient Stated Goal: To get stronger PT Goal Formulation: With patient Time For Goal Achievement: 01/28/18 Potential to Achieve Goals: Good    Frequency Min 2X/week   Barriers to discharge Inaccessible home environment;Decreased caregiver support      Co-evaluation               AM-PAC PT "6 Clicks" Daily Activity  Outcome  Measure Difficulty turning over in bed (including adjusting bedclothes, sheets and blankets)?: Unable Difficulty moving from lying on back to sitting on the side of the bed? : Unable Difficulty sitting down on and standing up from a chair with arms (e.g., wheelchair, bedside commode, etc,.)?: Unable Help needed moving to and from a bed to chair (including a wheelchair)?: A Lot Help needed walking in hospital room?: Total Help needed climbing 3-5 steps with a railing? : Total 6 Click Score: 7    End of Session Equipment Utilized During Treatment: Gait belt Activity Tolerance: Patient tolerated treatment well Patient left: in bed;with bed alarm set;with call bell/phone within reach;with family/visitor present Nurse Communication: Mobility status PT Visit Diagnosis: Muscle weakness (generalized) (M62.81);Difficulty in walking, not elsewhere classified (R26.2)    Time: 1000-1025 PT Time Calculation (min) (ACUTE ONLY): 25 min   Charges:   PT Evaluation $PT Eval Low Complexity: 1 Low PT Treatments $Therapeutic Exercise: 8-22 mins        D. Scott Zia Najera PT, DPT 01/15/18, 12:30 PM

## 2018-01-15 NOTE — Progress Notes (Signed)
Inpatient Diabetes Program Recommendations  AACE/ADA: New Consensus Statement on Inpatient Glycemic Control (2019)  Target Ranges:  Prepandial:   less than 140 mg/dL      Peak postprandial:   less than 180 mg/dL (1-2 hours)      Critically ill patients:  140 - 180 mg/dL  Results for Terry Tyler, Terry Tyler (MRN [id number]) as of 01/15/2018 10:57  Ref. Range 01/14/2018 11:45 01/14/2018 16:41 01/14/2018 21:15 01/15/2018 07:47  Glucose-Capillary Latest Ref Range: 70 - 99 mg/dL 240 (H) 273 (H) 212 (H) 185 (H)   Results for Terry Tyler, Terry Tyler (MRN [id number]) as of 01/14/2018 09:15  Ref. Range 01/13/2018 07:24 01/13/2018 16:58 01/13/2018 21:53 01/14/2018 00:02 01/14/2018 07:49  Glucose-Capillary Latest Ref Range: 70 - 99 mg/dL 163 (H) 196 (H) 215 (H) 221 (H) 240 (H)   Results for Terry Tyler, Terry Tyler (MRN [id number]) as of 01/14/2018 09:15  Ref. Range 11/26/2017 03:51  Hemoglobin A1C Latest Ref Range: 4.8 - 5.6 % 6.7 (H)   Review of Glycemic Control  Diabetes history: DM2 Outpatient Diabetes medications: None Current orders for Inpatient glycemic control: Novolog 0-9 units TID with meals, Novolog 0-5 units QHS  Inpatient Diabetes Program Recommendations:  Insulin - Meal Coverage: Post prandial glucose is consistently elevated. While inpatient, please consider ordering Novolog 3 units TID with meals for meal coverage if patient eats at least 50% of meals. HgbA1C: A1C 6.7% on 11/26/17 indicating an average glucose of 146 mg/dl.  Thanks, Barnie Alderman, RN, MSN, CDE Diabetes Coordinator Inpatient Diabetes Program [phone] (Team Pager from 8am to 5pm)

## 2018-01-15 NOTE — Progress Notes (Signed)
Dr. Lucky Cowboy at bedside, speaking with pt. And his wife re: procedural results. Both verbalized understanding.

## 2018-01-15 NOTE — Progress Notes (Signed)
HD Treatment initiated.   01/15/18 2112  Vital Signs  Pulse Rate 89  Resp 12  BP 99/66  Oxygen Therapy  SpO2 95 %  During Hemodialysis Assessment  Blood Flow Rate (mL/min) 400 mL/min  Arterial Pressure (mmHg) -200 mmHg  Venous Pressure (mmHg) 210 mmHg  Transmembrane Pressure (mmHg) 60 mmHg  Ultrafiltration Rate (mL/min) 710 mL/min  Dialysate Flow Rate (mL/min) 600 ml/min  Conductivity: Machine  14.2  HD Safety Checks Performed Yes  Dialysis Fluid Bolus Normal Saline  Bolus Amount (mL) 250 mL  Intra-Hemodialysis Comments Tx initiated  Fistula / Graft Left Forearm  Placement Date/Time: 11/25/17 1307   Placed prior to admission: Yes  Orientation: Left  Access Location: Forearm  Status Accessed  Needle Size 15

## 2018-01-15 NOTE — Progress Notes (Signed)
Pre HD Treatment    01/15/18 2053  Vital Signs  Temp 99.9 F (37.7 C)  Temp Source Oral  Pulse Rate 89  Pulse Rate Source Monitor  Resp 15  BP 101/68  BP Location Right Arm  BP Method Automatic  Patient Position (if appropriate) Lying  Oxygen Therapy  SpO2 92 %  O2 Device Room Air  Pain Assessment  Pain Scale 0-10  Pain Score 0  POSS Scale ([place] Opioid Sedation Scale)  POSS *See Group Information* 1-Acceptable,Awake and alert  Dialysis Weight  Weight 96 kg (211 lb 10.3 oz)  Type of Weight Pre-Dialysis  Time-Out for Hemodialysis  What Procedure? HD  Pt Identifiers(min of two) First/Last Name;MRN/Account#  Correct Site? Yes  Correct Side? Yes  Correct Procedure? Yes  Consents Verified? Yes  Rad Studies Available? N/A  Safety Precautions Reviewed? Yes  Engineer, civil (consulting) Number  ([id number])  Station Number 1  UF/Alarm Test Passed  Conductivity: Meter 14  Conductivity: Machine  14.2  pH 7.4  Reverse Osmosis Main  Normal Saline Lot Number [id number]  Dialyzer Lot Number [id number]  Disposable Set Lot Number [id number]  Machine Temperature 96.8 F (36 C)  Musician and Audible Yes  Blood Lines Intact and Secured Yes  Pre Treatment Patient Checks  Vascular access used during treatment Fistula  Hepatitis B Surface Antigen Results Negative  Date Hepatitis B Surface Antigen Drawn 11/25/17  Hepatitis B Surface Antibody  (> 10 on 08/14/17)  Date Hepatitis B Surface Antibody Drawn 08/14/17  Hemodialysis Consent Verified Yes  Hemodialysis Standing Orders Initiated Yes  ECG (Telemetry) Monitor On Yes  Prime Ordered Normal Saline  Length of  DialysisTreatment -hour(s) 3.5 Hour(s)  Dialyzer Elisio 17H NR  Dialysate 3K, 2.5 Ca  Dialysis Anticoagulant None  Dialysate Flow Ordered 600  Blood Flow Rate Ordered 400 mL/min  Ultrafiltration Goal 2 Liters  Pre Treatment Labs Renal panel;CBC  Dialysis Blood Pressure Support Ordered Normal Saline  Education / Care Plan   Dialysis Education Provided Yes  Documented Education in Care Plan Yes  Fistula / Graft Left Forearm  Placement Date/Time: 11/25/17 1307   Placed prior to admission: Yes  Orientation: Left  Access Location: Forearm  Site Condition No complications  Fistula / Graft Assessment Present;Thrill;Bruit  Drainage Description None

## 2018-01-15 NOTE — Progress Notes (Signed)
[place] [facility] [place] NAME: Terry Tyler    MR#:  [id number]  DATE OF BIRTH:  09-27-1950  SUBJECTIVE:  States his abdominal pain is a 2/10 this morning. Still feeling very weak and says he is hardly able to stand. No nausea, no vomiting. He did have a BM yesterday that was normal.  REVIEW OF SYSTEMS:  Review of Systems  Constitutional: Positive for malaise/fatigue. Negative for chills, fever and weight loss.  HENT: Negative for nosebleeds and sore throat.   Eyes: Negative for blurred vision.  Respiratory: Negative for cough, shortness of breath and wheezing.   Cardiovascular: Negative for chest pain, orthopnea, leg swelling and PND.  Gastrointestinal: Positive for abdominal pain. Negative for constipation, diarrhea, heartburn, nausea and vomiting.  Genitourinary: Negative for dysuria and urgency.  Musculoskeletal: Negative for back pain.  Skin: Negative for rash.  Neurological: Positive for weakness. Negative for dizziness, speech change, focal weakness and headaches.  Endo/Heme/Allergies: Does not bruise/bleed easily.  Psychiatric/Behavioral: Negative for depression.    DRUG ALLERGIES:   Allergies  Allergen Reactions  . Lisinopril Swelling  . Atorvastatin Calcium Other (See Comments)    Muscle cramps  Muscle cramps  Other reaction(s): Other (See Comments), Other (See Comments), Unknown Muscle cramping Muscle cramps  . Ezetimibe Other (See Comments)    Cannot remember what the allergy was. Other reaction(s): Other (See Comments), Other (See Comments), Unknown Cannot remember what the allergy was.  . Lipitor [Atorvastatin] Other (See Comments)    Muscle cramping   VITALS:  Blood pressure 90/67, pulse 85, temperature 98.5 F (36.9 C), temperature source Oral, resp. rate 17, height 6' (1.829 m), weight 91.6 kg (201 lb 15.1 oz), SpO2 94 %. PHYSICAL EXAMINATION:  Physical Exam  Constitutional: He is oriented to person, place,  and time.  Sitting up on the side of the bed, in NAD  HENT:  Head: Normocephalic and atraumatic.  Eyes: Pupils are equal, round, and reactive to light. Conjunctivae and EOM are normal.  Neck: Normal range of motion. Neck supple. No tracheal deviation present. No thyromegaly present.  Cardiovascular: Normal rate, regular rhythm and normal heart sounds.  Pulmonary/Chest: Effort normal and breath sounds normal. No respiratory distress. He has no wheezes. He exhibits no tenderness.  Abdominal: Soft. Bowel sounds are normal. He exhibits no distension, no fluid wave and no ascites. There is no tenderness.  Musculoskeletal: Normal range of motion.  Neurological: He is alert and oriented to person, place, and time. No cranial nerve deficit.  Global weakness  Skin: Skin is warm and dry. No rash noted.   LABORATORY PANEL:  Male CBC Recent Labs  Lab 01/15/18 0400  WBC 11.6*  HGB 9.4*  HCT 28.2*  PLT 178   ------------------------------------------------------------------------------------------------------------------ Chemistries  Recent Labs  Lab 01/11/18 2118  01/14/18 0354  NA 135   < > 136  K 5.1   < > 4.2  CL 95*   < > 97*  CO2 25   < > 28  GLUCOSE 234*   < > 264*  BUN 43*   < > 26*  CREATININE 7.58*   < > 4.97*  CALCIUM 8.6*   < > 8.3*  AST 57*  --   --   ALT 22  --   --   ALKPHOS 290*  --   --   BILITOT 4.0*  --   --    < > = values in this interval not displayed.   RADIOLOGY:  No results found. ASSESSMENT AND PLAN:  20 y m admitted for abd pain, n/v  *Suspected acute gastroenteritis: abdominal pain has improved. Continues to have a lot of weakness and is unable to walk - s/p paracentesis 8/5 with improvement in abdominal pain. - c diff negative - norovirus pcr pending - zofran as needed  *Ascites- resolved after paracentesis. Concern for SBP given elevated WBC count (elevated WBC count could also be due to foot wound). Afebrile and hemodynamically stable. -  cipro daily for suspected SBP  *Left foot wound- located between 3rd and 4th toes with foul smell - continue augmentin and cipro - wound care following - plan for left lower extremity angiography today  *Hypotension with a history of hypertension- having low BPs, worse after HD - monitor overnight after HD, if stable overnight can likely d/c in the morning - orthostatics negative  *Generalized weakness- states he is unable to walk today - PT evaluation- recommend SNF - CSW consult for placement  *Dysphagia- patient states he has a lot of coughing with eating - SLP consulted- placed on dysphagia III diet - will need GI f/u for esophageal dysmotility  *ESRD on HD - HD m/w/f  * Diabetes: blood sugars well-controlled. - continue sliding scale.  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 Management plans discussed with the patient, family (Wife at bedside) and they are in agreement.  CODE STATUS: Full Code  TOTAL TIME TAKING CARE OF THIS PATIENT: 35 minutes.   More than 50% of the time was spent in counseling/coordination of care: YES  POSSIBLE D/C likely tomorrow after angiogram, DEPENDING ON CLINICAL CONDITION.   Berna Spare Mayo M.D on 01/15/2018 at 2:12 PM  Between 7am to 6pm - Pager [phone]  After 6pm go to [email or web address] - Proofreader  [facility]  Office  [phone]  CC: Primary care physician; Elisabeth Cara, NP  Note: This dictation was prepared with Dragon dictation along with smaller phrase technology. Any transcriptional errors that result from this process are unintentional.

## 2018-01-15 NOTE — Progress Notes (Signed)
HD Pre Assessment    01/15/18 2054  Neurological  Level of Consciousness Alert  Orientation Level Oriented X4  Respiratory  Respiratory Pattern Regular;Unlabored  Chest Assessment Chest expansion symmetrical  Cardiac  Pulse Regular  Jugular Venous Distention (JVD) No  ECG Monitor Yes  Cardiac Rhythm NSR  Ectopy Multifocal PVC's  Ectopy Frequency Frequent  Antiarrhythmic device No  Vascular  R Radial Pulse +2  L Radial Pulse +2  Integumentary  Integumentary (WDL) X  Skin Color Appropriate for ethnicity  Skin Condition Dry;Flaky  Musculoskeletal  Musculoskeletal (WDL) X  Generalized Weakness Yes  GU Assessment  Genitourinary (WDL) X  Genitourinary Symptoms  (HD patient)  Psychosocial  Psychosocial (WDL) WDL

## 2018-01-15 NOTE — H&P (Signed)
[facility] History & Physical Update  The patient was interviewed and re-examined.  The patient's previous History and Physical has been reviewed and is unchanged.  There is no change in the plan of care. We plan to proceed with the scheduled procedure.  [name], MD  01/15/2018, 2:23 PM

## 2018-01-15 NOTE — NC FL2 (Signed)
[employer] LEVEL OF CARE SCREENING TOOL     IDENTIFICATION  Patient Name: Terry Tyler Birthdate: Sep 22, 1950 Sex: male Admission Date (Current Location): 01/11/2018  [facility] and [place] Number:  Engineering geologist and Address:  [facility], [address], [place], [place] [address]      Provider Number: [id number]  Attending Physician Name and Address:  Sela Hua, MD  Relative Name and Phone Number:       Current Level of Care: Hospital Recommended Level of Care: [place] Prior Approval Number:    Date Approved/Denied:   PASRR Number:    Discharge Plan: SNF    Current Diagnoses: Patient Active Problem List   Diagnosis Date Noted  . Acute gastroenteritis 01/11/2018  . Acute CVA (cerebrovascular accident) ([place]) 11/25/2017  . Pneumonia 10/12/2017  . HCAP (healthcare-associated pneumonia) 10/11/2017  . CHF (congestive heart failure) ([place]) 07/16/2017  . GI bleed 05/07/2017  . 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 Hyperbilirubinemia 04/10/2017  . Diarrhea 02/20/2017  . C. difficile diarrhea 02/19/2017  . Diarrhea of presumed infectious origin 11/01/2016  . ESRD on dialysis ([place]) 10/16/2016  . Fatigue 10/02/2016  . Leukocytosis 10/02/2016  . Nausea & vomiting 10/02/2016  . 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 Elevated troponin 09/08/2016  . CVA (cerebral vascular accident) ([place]) 04/09/2016  . Hyperphosphatemia 08/09/2015  . Secondary hyperparathyroidism ([place]) 08/09/2015  . Chronic kidney disease (CKD) stage G4/A1, severely decreased glomerular filtration rate (GFR) between 15-29 mL/min/1.73 square meter and albuminuria creatinine ratio less than 30 mg/g (HCC) 05/31/2015  . Bilateral carotid artery stenosis 07/24/2014  . Hypertensive heart disease without heart failure 07/24/2014  . Nonrheumatic mitral valve insufficiency 07/24/2014  . Stenosis of carotid artery 07/24/2014  .  Anemia 03/30/2013  . Vitamin D deficiency 03/30/2013  . Obesity 08/28/2012  . PAD (peripheral artery disease) ([place]) 04/10/2012  . CAD (coronary artery disease)   . Hyperlipidemia   . Hypertension   . Proteinuria 06/19/2010  . Type II diabetes mellitus ([place]) 06/19/2010    Orientation RESPIRATION BLADDER Height & Weight     Self, Time, Place, Situation  Normal Incontinent Weight: 201 lb 15.1 oz (91.6 kg) Height:  6' (182.9 cm)  BEHAVIORAL SYMPTOMS/MOOD NEUROLOGICAL BOWEL NUTRITION STATUS  (none) (none) Incontinent Diet  AMBULATORY STATUS COMMUNICATION OF NEEDS Skin   Extensive Assist Verbally PU Stage and Appropriate Care                       Personal Care Assistance Level of Assistance  Bathing, Dressing Bathing Assistance: Limited assistance   Dressing Assistance: Limited assistance     Functional Limitations Info  (no issues)          SPECIAL CARE FACTORS FREQUENCY  PT (By licensed PT)                    Contractures Contractures Info: Not present    Additional Factors Info  Code Status               Current Medications (01/15/2018):  This is the current hospital active medication list Current Facility-Administered Medications  Medication Dose Route Frequency Provider Last Rate Last Dose  . 0.9 %  sodium chloride infusion  250 mL Intravenous PRN Demetrios Loll, MD      . acetaminophen (TYLENOL) tablet 650 mg  650 mg Oral Q6H PRN Demetrios Loll, MD   650 mg at 01/13/18 1158  Or  . acetaminophen (TYLENOL) suppository 650 mg  650 mg Rectal Q6H PRN Demetrios Loll, MD      . albuterol (PROVENTIL) (2.5 MG/3ML) 0.083% nebulizer solution 2.5 mg  2.5 mg Nebulization Q2H PRN Demetrios Loll, MD      . amoxicillin-clavulanate (AUGMENTIN) 500-125 MG per tablet 500 mg  500 mg Oral Q24H Mayo, Pete Pelt, MD   500 mg at 01/14/18 1724  . aspirin EC tablet 81 mg  81 mg Oral QHS Demetrios Loll, MD   81 mg at 01/14/18 2122  . ciprofloxacin (CIPRO) tablet 500 mg  500 mg Oral Q24H Max Sane, MD   500 mg at 01/14/18 2122  . feeding supplement (NEPRO CARB STEADY) liquid 237 mL  237 mL Oral BID BM Mayo, Pete Pelt, MD      . gabapentin (NEURONTIN) capsule 300 mg  300 mg Oral TID Demetrios Loll, MD   300 mg at 01/14/18 2123  . HYDROcodone-acetaminophen (NORCO/VICODIN) 5-325 MG per tablet 1-2 tablet  1-2 tablet Oral Q4H PRN Demetrios Loll, MD   2 tablet at 01/14/18 1115  . insulin aspart (novoLOG) injection 0-5 Units  0-5 Units Subcutaneous QHS Demetrios Loll, MD   2 Units at 01/14/18 2122  . insulin aspart (novoLOG) injection 0-9 Units  0-9 Units Subcutaneous TID WC Demetrios Loll, MD   2 Units at 01/15/18 1217  . metoprolol tartrate (LOPRESSOR) tablet 12.5 mg  12.5 mg Oral BID Sela Hua, MD   12.5 mg at 01/13/18 2052  . multivitamin (RENA-VIT) tablet 1 tablet  1 tablet Oral Daily Demetrios Loll, MD   1 tablet at 01/14/18 [phone]  . ondansetron (ZOFRAN) tablet 4 mg  4 mg Oral Q6H PRN Demetrios Loll, MD       Or  . ondansetron [facility]) injection 4 mg  4 mg Intravenous Q6H PRN Demetrios Loll, MD   4 mg at 01/14/18 0050  . pantoprazole (PROTONIX) EC tablet 40 mg  40 mg Oral BID Otho Perl, MD   40 mg at 01/14/18 1610  . rosuvastatin (CRESTOR) tablet 20 mg  20 mg Oral QHS Demetrios Loll, MD   20 mg at 01/14/18 2123  . sodium chloride flush (NS) 0.9 % injection 3 mL  3 mL Intravenous Q12H Demetrios Loll, MD   3 mL at 01/15/18 1027  . sodium chloride flush (NS) 0.9 % injection 3 mL  3 mL Intravenous PRN Demetrios Loll, MD         Discharge Medications: Please see discharge summary for a list of discharge medications.  Relevant Imaging Results:  Relevant Lab Results:   Additional Information ss: [id number]  Shela Leff, LCSW

## 2018-01-16 ENCOUNTER — Encounter: Payer: Self-pay | Admitting: Vascular Surgery

## 2018-01-16 LAB — CBC
HEMATOCRIT: 28.9 % — AB (ref 40.0–52.0)
HEMATOCRIT: 29.9 % — AB (ref 40.0–52.0)
HEMOGLOBIN: 9.6 g/dL — AB (ref 13.0–18.0)
HEMOGLOBIN: 10 g/dL — AB (ref 13.0–18.0)
MCH: 26.7 pg (ref 26.0–34.0)
MCH: 26.6 pg (ref 26.0–34.0)
MCHC: 33.2 g/dL (ref 32.0–36.0)
MCHC: 33.3 g/dL (ref 32.0–36.0)
MCV: 80.2 fL (ref 80.0–100.0)
MCV: 79.7 fL — ABNORMAL LOW (ref 80.0–100.0)
Platelets: 195 10*3/uL (ref 150–440)
Platelets: 177 10*3/uL (ref 150–440)
RBC: 3.6 MIL/uL — AB (ref 4.40–5.90)
RBC: 3.75 MIL/uL — ABNORMAL LOW (ref 4.40–5.90)
RDW: 17.4 % — ABNORMAL HIGH (ref 11.5–14.5)
RDW: 17.6 % — ABNORMAL HIGH (ref 11.5–14.5)
WBC: 13.4 10*3/uL — AB (ref 3.8–10.6)
WBC: 14.3 10*3/uL — ABNORMAL HIGH (ref 3.8–10.6)

## 2018-01-16 LAB — RENAL FUNCTION PANEL
ALBUMIN: 2.4 g/dL — AB (ref 3.5–5.0)
ANION GAP: 14 (ref 5–15)
Albumin: 2.5 g/dL — ABNORMAL LOW (ref 3.5–5.0)
Anion gap: 13 (ref 5–15)
BUN: 40 mg/dL — ABNORMAL HIGH (ref 8–23)
BUN: 24 mg/dL — AB (ref 8–23)
CHLORIDE: 96 mmol/L — AB (ref 98–111)
CO2: 24 mmol/L (ref 22–32)
CO2: 27 mmol/L (ref 22–32)
CREATININE: 4.81 mg/dL — AB (ref 0.61–1.24)
Calcium: 8.3 mg/dL — ABNORMAL LOW (ref 8.9–10.3)
Calcium: 8.4 mg/dL — ABNORMAL LOW (ref 8.9–10.3)
Chloride: 94 mmol/L — ABNORMAL LOW (ref 98–111)
Creatinine, Ser: 7.16 mg/dL — ABNORMAL HIGH (ref 0.61–1.24)
GFR calc Af Amer: 8 mL/min — ABNORMAL LOW (ref >60)
GFR calc Af Amer: 13 mL/min — ABNORMAL LOW (ref >60)
GFR, EST NON AFRICAN AMERICAN: 7 mL/min — AB (ref >60)
GFR, EST NON AFRICAN AMERICAN: 12 mL/min — AB (ref >60)
GLUCOSE: 197 mg/dL — AB (ref 70–99)
Glucose, Bld: 147 mg/dL — ABNORMAL HIGH (ref 70–99)
PHOSPHORUS: 5.4 mg/dL — AB (ref 2.5–4.6)
Phosphorus: 3.6 mg/dL (ref 2.5–4.6)
Potassium: 4.4 mmol/L (ref 3.5–5.1)
Potassium: 4.1 mmol/L (ref 3.5–5.1)
Sodium: 132 mmol/L — ABNORMAL LOW (ref 135–145)
Sodium: 136 mmol/L (ref 135–145)

## 2018-01-16 LAB — NOROVIRUS GROUP 1 & 2 BY PCR, STOOL
NOROVIRUS 1 BY PCR: NEGATIVE
Norovirus 2  by PCR: NEGATIVE

## 2018-01-16 LAB — GLUCOSE, CAPILLARY
GLUCOSE-CAPILLARY: 278 mg/dL — AB (ref 70–99)
Glucose-Capillary: 239 mg/dL — ABNORMAL HIGH (ref 70–99)
Glucose-Capillary: 247 mg/dL — ABNORMAL HIGH (ref 70–99)

## 2018-01-16 LAB — PROTIME-INR
INR: 1.92
PROTHROMBIN TIME: 21.8 s — AB (ref 11.4–15.2)

## 2018-01-16 MED ORDER — INSULIN ASPART 100 UNIT/ML ~~LOC~~ SOLN
3.0000 [IU] | Freq: Three times a day (TID) | SUBCUTANEOUS | Status: DC
  Filled 2018-01-16: qty 1

## 2018-01-16 NOTE — Progress Notes (Signed)
Post HD assessment. Pt tolerated tx well without c/o or complication. Net UF 1907, goal met.    01/16/18 0043  Vital Signs  Temp 98.7 F (37.1 C)  Temp Source Oral  Pulse Rate 93  Pulse Rate Source Monitor  Resp 13  BP 102/72  BP Location Right Arm  BP Method Automatic  Patient Position (if appropriate) Lying  Oxygen Therapy  SpO2 96 %  O2 Device Room Air  Dialysis Weight  Weight 94.8 kg (208 lb 15.9 oz)  Type of Weight Post-Dialysis  Post-Hemodialysis Assessment  Rinseback Volume (mL) 250 mL  KECN 73.6 V  Dialyzer Clearance Lightly streaked  Duration of HD Treatment -hour(s) 3.25 hour(s)  Hemodialysis Intake (mL) 500 mL  UF Total -Machine (mL) 2407 mL  Net UF (mL) 1907 mL  Tolerated HD Treatment Yes  AVG/AVF Arterial Site Held (minutes) 10 minutes  AVG/AVF Venous Site Held (minutes) 10 minutes  Education / Care Plan  Dialysis Education Provided Yes  Documented Education in Care Plan Yes  Fistula / Graft Left Forearm  Placement Date/Time: 11/25/17 1307   Placed prior to admission: Yes  Orientation: Left  Access Location: Forearm  Site Condition No complications  Fistula / Graft Assessment Present;Thrill;Bruit  Status Deaccessed  Drainage Description None

## 2018-01-16 NOTE — Progress Notes (Signed)
Post HD assessment    01/16/18 0042  Neurological  Level of Consciousness Alert  Orientation Level Oriented X4  Respiratory  Respiratory Pattern Regular;Unlabored  Chest Assessment Chest expansion symmetrical  Cardiac  ECG Monitor Yes  Ectopy Frequency Frequent  Antiarrhythmic device No  Vascular  R Radial Pulse +2  L Radial Pulse +2  Integumentary  Integumentary (WDL) X  Skin Color Appropriate for ethnicity  Musculoskeletal  Musculoskeletal (WDL) X  Generalized Weakness Yes  Assistive Device None  GU Assessment  Genitourinary (WDL) X  Genitourinary Symptoms  (HD)  Psychosocial  Psychosocial (WDL) WDL

## 2018-01-16 NOTE — Care Management (Signed)
[name] with [facility] notified that patient to discharge to [facility].  RNCM signing off

## 2018-01-16 NOTE — Progress Notes (Signed)
HD tx end    01/16/18 0038  Vital Signs  Pulse Rate 95  Pulse Rate Source Monitor  Resp 15  BP 98/67  BP Location Right Arm  BP Method Automatic  Patient Position (if appropriate) Lying  Oxygen Therapy  SpO2 100 %  O2 Device Room Air  During Hemodialysis Assessment  Dialysis Fluid Bolus Normal Saline  Bolus Amount (mL) 250 mL  Intra-Hemodialysis Comments Tx completed

## 2018-01-16 NOTE — Discharge Instructions (Signed)
It was a pleasure meeting you during this hospitalization!  You were hospitalized because you were having nausea, vomiting, diarrhea, and abdominal pain.  We think this was caused by an infection of your stomach and intestines.  You also had a lot of fluid buildup in your  stomach.  We drained 5 L of fluid off of your stomach. You also had your angiogram done while you were here to help bring good blood flow to your foot, which will help your wound heal.  We have made the following medication changes: 1.  We decreased your dose of metoprolol tartrate from 25 mg twice a day to 12.5 mg twice a day because your blood pressures were a little bit low. 3. We started you on 2 antibiotics for your foot infection.  Please take augmentin once a day and ciprofloxacin once a day for 4 more days.  If you start having any worsening abdominal pain, fevers, or feeling really sick, please come back to the emergency department.  -Dr. Brett Albino

## 2018-01-16 NOTE — Care Management Important Message (Signed)
Copy of signed IM left with patient in room.  

## 2018-01-16 NOTE — Clinical Social Work Note (Signed)
Clinical Social Work Assessment  Patient Details  Name: Terry Tyler MRN: [id number] Date of Birth: Jan 05, 1951  Date of referral:  01/16/18               Reason for consult:  Discharge Planning                Permission sought to share information with:  Chartered certified accountant granted to share information::  Yes, Verbal Permission Granted  Name::        Agency::     Relationship::     Contact Information:     Housing/Transportation Living arrangements for the past 2 months:  Single Family Home Source of Information:  Patient Patient Interpreter Needed:  None Criminal Activity/Legal Involvement Pertinent to Current Situation/Hospitalization:  No - Comment as needed Significant Relationships:  Adult Children, Spouse Lives with:    Do you feel safe going back to the place where you live?  Yes Need for family participation in patient care:  Yes (Comment)  Care giving concerns:  Patient resides at home with his wife.   Social Worker assessment / plan:  PT assessed patient and recommended short term rehab. Patient is agreeable. CSW informed of this yesterday. Patient went for a procedure yesterday afternoon so CSW was not able to follow up with patient until today. CSW initiated prior auth with [facility] yesterday early afternoon. CSW extended bed offers to patient this morning and he chose [facility]. CSW notified [facility]. Awaiting prior auth.  Employment status:  Disabled (Comment on whether or not currently receiving Disability) Insurance information:    PT Recommendations:  [place] / Referral to community resources:     Patient/Family's Response to care:  Patient expressed appreciation for CSW assistance.  Patient/Family's Understanding of and Emotional Response to Diagnosis, Current Treatment, and Prognosis:  Patient agrees with recommendation for short term rehab.  Emotional Assessment Appearance:   Appears stated age Attitude/Demeanor/Rapport:  (pleasant and cooperative) Affect (typically observed):  Accepting, Calm Orientation:  Oriented to Self, Oriented to Place, Oriented to  Time, Oriented to Situation Alcohol / Substance use:  Not Applicable Psych involvement (Current and /or in the community):  No (Comment)  Discharge Needs  Concerns to be addressed:  Care Coordination Readmission within the last 30 days:  No Current discharge risk:  None Barriers to Discharge:  No Barriers Identified   Shela Leff, LCSW 01/16/2018, 10:13 AM

## 2018-01-16 NOTE — Clinical Social Work Note (Signed)
Patient's daughter, Angela Nevin, called CSW and was upset because she did not want her father to go to [facility] because that is where she worked. She wanted him to go to [facility]. This was the first time that the daughter had attempted to make any contact with CSW. CSW explained that her father was given bed offers and that he chose [facility]. CSW explained that the insurance authorized him to go this afternoon and that her father has been discharged since this morning. Patient's daughter hung up on CSW. CSW contacted insurance company and worked out for the authorization to be transferred to [facility] and Tam[facility] at [facility] is aware and discharge information has been sent.  Shela Leff MSW,LCSW [phone]

## 2018-01-16 NOTE — Evaluation (Signed)
Occupational Therapy Evaluation Patient Details Name: Terry Tyler MRN: [id number] DOB: February 05, 1951 Today's Date: 01/16/2018    History of Present Illness Pt is a8 y.o.malewith a known history of C-diff, CHF, CKD on HD, MI, CAD, DM, HTN, HLD, TIA, and PVD. The patient presented to the ED with complaints of weakness. In addition, he complained of abdominal pain and worsening distention. Pt came to ED and was found with hypotension. He missedhemodialysis due to nausea and vomiting.  Assessment includes: suspected acute gastroenteritis s/p paracentesis 8/5, ascites, L foot wound, weakness, dysphagia, ESRD on HD, HTN, DM, and h/o DVT.    Clinical Impression   Pt seen for OT evaluation this date. Prior to hospital admission, pt was living in mobile home with spouse who assists him with bathing and dressing and all IADL, with pt endorsing several falls per month due to RLE "giving out." Currently pt demonstrates impairments in strength bilaterally (RLE<LLE), activity tolerance, edema in RLE, and balance requiring mod-max assist for LB ADL and min assist for all mobility. Pt instructed in hand/foot placement to improve functional transfers from EOB. Pt able to return demo but unable to tolerate standing for more than 1 minute 2/2 to RLE "giving out." Although, no buckling noted. Pt would benefit from skilled OT to address noted impairments and functional limitations (see below for any additional details) in order to maximize safety and independence while minimizing falls risk and caregiver burden.  Upon hospital discharge, recommend pt discharge to [facility].    Follow Up Recommendations  SNF    Equipment Recommendations  3 in 1 bedside commode    Recommendations for Other Services       Precautions / Restrictions Precautions Precautions: Fall Restrictions Weight Bearing Restrictions: No LLE Weight Bearing: Weight bearing as tolerated      Mobility Bed Mobility Overal bed mobility: Needs  Assistance Bed Mobility: Supine to Sit;Sit to Supine     Supine to sit: Min assist Sit to supine: Min assist   General bed mobility comments: Min A for both BLEs and upper body during sup to sit and for BLEs during sit to sup (RLE>LLE)  Transfers Overall transfer level: Needs assistance Equipment used: Rolling walker (2 wheeled) Transfers: Sit to/from Stand Sit to Stand: Min assist;From elevated surface         General transfer comment: bed elevated to support pt in transfer and cues for feet/hands placement to maximize success, still requiring min assist    Balance Overall balance assessment: Needs assistance Sitting-balance support: Feet supported;Bilateral upper extremity supported Sitting balance-Leahy Scale: Good     Standing balance support: Bilateral upper extremity supported Standing balance-Leahy Scale: Fair                             ADL either performed or assessed with clinical judgement   ADL Overall ADL's : Needs assistance/impaired Eating/Feeding: Bed level;Independent   Grooming: Bed level;Independent   Upper Body Bathing: Sitting;Minimal assistance;Moderate assistance   Lower Body Bathing: Sit to/from stand;Moderate assistance   Upper Body Dressing : Sitting;Minimal assistance   Lower Body Dressing: Sit to/from stand;Moderate assistance   Toilet Transfer: RW;Minimal assistance;Stand-pivot;BSC                   Vision Baseline Vision/History: No visual deficits Patient Visual Report: No change from baseline       Perception     Praxis      Pertinent Vitals/Pain Pain Assessment: No/denies  pain     Hand Dominance Right   Extremity/Trunk Assessment Upper Extremity Assessment Upper Extremity Assessment: Overall WFL for tasks assessed   Lower Extremity Assessment Lower Extremity Assessment: Generalized weakness(weak RLE>LLE, RLE with pitting edema (per pt this is baseline), wound w/ bandage on dorsal aspect of L  foot)       Communication Communication Communication: No difficulties   Cognition Arousal/Alertness: Awake/alert Behavior During Therapy: WFL for tasks assessed/performed Overall Cognitive Status: Within Functional Limits for tasks assessed                                     General Comments       Exercises     Shoulder Instructions      Home Living Family/patient expects to be discharged to:: Private residence Living Arrangements: Spouse/significant other Available Help at Discharge: Family;Available PRN/intermittently Type of Home: Mobile home Home Access: Stairs to enter Entrance Stairs-Number of Steps: 4 Entrance Stairs-Rails: Right;Left;Can reach both Home Layout: One level     Bathroom Shower/Tub: Teacher, early years/pre: Handicapped height     Home Equipment: Environmental consultant - 2 wheels;Walker - 4 wheels;Cane - quad;Electric scooter   Additional Comments: Pt lives in mobile home w/ spouse (provides very little assist and works), 4 steps to enter with bilat rails, tub shower, and elevated toilet.      Prior Functioning/Environment Level of Independence: Needs assistance  Gait / Transfers Assistance Needed: Pt Mod Ind with amb with a QC in the home and uses an electric scooter in the community, min A up/down stairs, history of 3-4 falls/month but only one fall since June (2/2 RLE "giving out") ADL's / Homemaking Assistance Needed: Pt requires assist for sponge bathing, LB dressing, and all IADL including med mgt including driving             OT Problem List: Decreased strength;Increased edema;Decreased knowledge of use of DME or AE;Decreased activity tolerance;Impaired balance (sitting and/or standing)      OT Treatment/Interventions: Self-care/ADL training;Balance training;Therapeutic exercise;Therapeutic activities;DME and/or AE instruction;Patient/family education    OT Goals(Current goals can be found in the care plan section) Acute  Rehab OT Goals Patient Stated Goal: To go to rehab and get stronger OT Goal Formulation: With patient Time For Goal Achievement: 01/30/18 Potential to Achieve Goals: Good ADL Goals Pt Will Perform Grooming: with min guard assist;standing(tolerate standing at sink for >2 min to perform grooming) Pt Will Perform Lower Body Dressing: with adaptive equipment;sit to/from stand;with min guard assist Pt Will Transfer to Toilet: with min guard assist;bedside commode;ambulating(LRAD for amb)  OT Frequency: Min 1X/week   Barriers to D/C:            Co-evaluation              AM-PAC PT "6 Clicks" Daily Activity     Outcome Measure Help from another person eating meals?: None Help from another person taking care of personal grooming?: None Help from another person toileting, which includes using toliet, bedpan, or urinal?: A Little Help from another person bathing (including washing, rinsing, drying)?: A Lot Help from another person to put on and taking off regular upper body clothing?: A Little Help from another person to put on and taking off regular lower body clothing?: A Lot 6 Click Score: 18   End of Session Equipment Utilized During Treatment: Gait belt;Rolling walker  Activity Tolerance: Patient tolerated treatment well  Patient left: in bed;with call bell/phone within reach;with bed alarm set  OT Visit Diagnosis: Other abnormalities of gait and mobility (R26.89);Repeated falls (R29.6);Muscle weakness (generalized) (M62.81)                Time: 9191-6606 OT Time Calculation (min): 21 min Charges:  OT General Charges $OT Visit: 1 Visit OT Evaluation $OT Eval Low Complexity: 1 Low OT Treatments $Self Care/Home Management : 8-22 mins  Jeni Salles, MPH, MS, OTR/L ascom [phone] 01/16/18, 9:26 AM

## 2018-01-16 NOTE — Progress Notes (Signed)
[facility]  ROUNDING NOTE   Subjective:   Angiogram with balloon angioplasty yesterday by Dr. Lucky Cowboy.   Hemodialysis yesterday evening. Tolerated procedure well. UF of 1941mL.   Working with physical therapy this morning.   Objective:  Vital signs in last 24 hours:  Temp:  [97.5 F (36.4 C)-99.9 F (37.7 C)] 98.5 F (36.9 C) (08/08 0436) Pulse Rate:  [87-96] 90 (08/08 0809) Resp:  [10-25] 19 (08/08 0436) BP: (91-108)/(55-75) 99/68 (08/08 0809) SpO2:  [89 %-100 %] 99 % (08/08 0436) Weight:  [94.8 kg-96 kg] 94.8 kg (08/08 0043)  Weight change:  Filed Weights   01/13/18 1525 01/15/18 2053 01/16/18 0043  Weight: 91.6 kg 96 kg 94.8 kg    Intake/Output: I/O last 3 completed shifts: In: 240 [P.O.:240] Out: 1907 [FKCLE:7517]   Intake/Output this shift:  No intake/output data recorded.  Physical Exam: General: No acute distress  Head: Normocephalic, atraumatic. Moist oral mucosal membranes  Eyes: Anicteric  Neck: Supple, trachea midline  Lungs:  Clear to auscultation, normal effort  Heart: S1S2 no rubs  Abdomen:  Soft, nontender, +ascites  Extremities: trace peripheral edema.    Neurologic: Awake, alert, following commands  Skin: No lesions  Access: LUE AVF    Basic Metabolic Panel: Recent Labs  Lab 01/12/18 0506 01/12/18 1316 01/13/18 0336 01/14/18 0354 01/15/18 2155 01/16/18 0344  NA 134*  --  135 136 132* 136  K 4.5  --  3.8 4.2 4.4 4.1  CL 96*  --  94* 97* 94* 96*  CO2 23  --  28 28 24 27   GLUCOSE 236*  --  175* 264* 147* 197*  BUN 45*  --  36* 26* 40* 24*  CREATININE 7.83*  --  6.34* 4.97* 7.16* 4.81*  CALCIUM 8.6*  --  8.5* 8.3* 8.3* 8.4*  PHOS  --  5.8* 4.4 3.7 5.4* 3.6    Liver Function Tests: Recent Labs  Lab 01/10/18 0745 01/11/18 2118 01/14/18 0354 01/15/18 2155 01/16/18 0344  AST 47* 57*  --   --   --   ALT 26 22  --   --   --   ALKPHOS 294* 290*  --   --   --   BILITOT 2.5* 4.0*  --   --   --   PROT 7.9 8.6*  --   --    --   ALBUMIN 2.8* 2.9* 2.5* 2.4* 2.5*   Recent Labs  Lab 01/11/18 2118  LIPASE 26   No results for input(s): AMMONIA in the last 168 hours.  CBC: Recent Labs  Lab 01/11/18 2118  01/13/18 0336 01/14/18 0354 01/15/18 0400 01/15/18 2155 01/16/18 0344  WBC 14.0*   < > 12.2* 12.0* 11.6* 13.4* 14.3*  NEUTROABS 12.3*  --   --   --   --   --   --   HGB 10.1*   < > 9.2* 8.9* 9.4* 9.6* 10.0*  HCT 30.9*   < > 27.3* 26.9* 28.2* 28.9* 29.9*  MCV 82.0   < > 80.3 80.9 81.0 80.2 79.7*  PLT 175   < > 172 185 178 195 177   < > = values in this interval not displayed.    Cardiac Enzymes: Recent Labs  Lab 01/10/18 0745 01/11/18 2118  TROPONINI 0.03* 0.05*    BNP: Invalid input(s): POCBNP  CBG: Recent Labs  Lab 01/15/18 0747 01/15/18 1137 01/15/18 1606 01/15/18 1652 01/16/18 0739  GLUCAP 185* 159* 138* 124* 239*    Microbiology:  Results for orders placed or performed during the hospital encounter of 01/11/18  C difficile quick scan w PCR reflex     Status: None   Collection Time: 01/12/18  3:34 AM  Result Value Ref Range Status   C Diff antigen NEGATIVE NEGATIVE Final   C Diff toxin NEGATIVE NEGATIVE Final   C Diff interpretation No C. difficile detected.  Final    Comment: Performed at [facility], [address] [place], [place] [address]  MRSA PCR Screening     Status: None   Collection Time: 01/12/18  8:24 AM  Result Value Ref Range Status   MRSA by PCR NEGATIVE NEGATIVE Final    Comment:        The GeneXpert MRSA Assay (FDA approved for NASAL specimens only), is one component of a comprehensive MRSA colonization surveillance program. It is not intended to diagnose MRSA infection nor to guide or monitor treatment for MRSA infections. Performed at [facility], [address] [place], [place] [address]     Coagulation Studies: Recent Labs    01/14/18 0354 01/15/18 0400 01/16/18 0344  LABPROT 26.3* 22.7* 21.8*  INR 2.44 2.02 1.92     Urinalysis: No results for input(s): COLORURINE, LABSPEC, PHURINE, GLUCOSEU, HGBUR, BILIRUBINUR, KETONESUR, PROTEINUR, UROBILINOGEN, NITRITE, LEUKOCYTESUR in the last 72 hours.  Invalid input(s): APPERANCEUR    Imaging: No results found.   Medications:   . sodium chloride     . amoxicillin-clavulanate  500 mg Oral Q24H  . aspirin EC  81 mg Oral QHS  . ciprofloxacin  500 mg Oral Q24H  . feeding supplement (NEPRO CARB STEADY)  237 mL Oral BID BM  . gabapentin  300 mg Oral TID  . insulin aspart  0-5 Units Subcutaneous QHS  . insulin aspart  0-9 Units Subcutaneous TID WC  . insulin aspart  3 Units Subcutaneous TID WC  . metoprolol tartrate  12.5 mg Oral BID  . multivitamin  1 tablet Oral Daily  . pantoprazole  40 mg Oral BID AC  . rosuvastatin  20 mg Oral QHS  . sodium chloride flush  3 mL Intravenous Q12H   sodium chloride, acetaminophen **OR** acetaminophen, albuterol, HYDROcodone-acetaminophen, ondansetron **OR** ondansetron (ZOFRAN) IV, sodium chloride flush  Assessment/ Plan:   [name] is a 67 y.o. black male with end stage renal disease on hemodialysis, diabetes mellitus type II, CVA, hypertension, peripheral vascular disease, congestive heart failure,   MWF/[facility]/LUE AVF  1. End stage renal disease on hemodialysis:  - Continue MWF schedule. Hemodialysis for tomorrow.   2.  Anemia of chronic kidney disease.  Hemoglobin 10 - Mircera as an outpatient.  3.  Secondary hyperparathyroidism.  Not currently on binders as outpatient.   4.  Ascites.  Status post large volume paracentesis ultrasound guided on 8/5. Yield of 5 liters removed.   5.  Hypertension: blood pressure is low. Monitor closely. Home regimen is metoprolol.    6. Peripheral vascular disease: Left lower extremity with gangrene status post left lower extremity angiogram with balloon angioplasty by Dr. Lucky Cowboy on 8/8   LOS: 3 Strider Vallance 8/8/201910:05 AM

## 2018-01-16 NOTE — Progress Notes (Signed)
Pt prepared for d/c to SNF. IV d/c'd. Skin intact except as charted in most recent assessments. Wound dressing was changed to left foot. Vitals are stable. Report called to receiving facility. Pt to be transported by ambulance service.  Terry Tyler [employer]

## 2018-01-20 ENCOUNTER — Ambulatory Visit: Payer: Medicare HMO | Admitting: Podiatry

## 2018-01-23 ENCOUNTER — Telehealth (INDEPENDENT_AMBULATORY_CARE_PROVIDER_SITE_OTHER): Payer: Self-pay

## 2018-01-24 NOTE — Telephone Encounter (Signed)
Nurse at [facility] was inform with JD medical advise

## 2018-01-27 ENCOUNTER — Telehealth (INDEPENDENT_AMBULATORY_CARE_PROVIDER_SITE_OTHER): Payer: Self-pay

## 2018-01-28 ENCOUNTER — Encounter (INDEPENDENT_AMBULATORY_CARE_PROVIDER_SITE_OTHER): Payer: Self-pay | Admitting: Vascular Surgery

## 2018-01-28 ENCOUNTER — Ambulatory Visit (INDEPENDENT_AMBULATORY_CARE_PROVIDER_SITE_OTHER): Payer: Medicare HMO | Admitting: Vascular Surgery

## 2018-01-28 VITALS — BP 116/75 | HR 77 | Resp 16 | Ht 72.0 in | Wt 210.0 lb

## 2018-01-28 DIAGNOSIS — I10 Essential (primary) hypertension: Secondary | ICD-10-CM

## 2018-01-28 DIAGNOSIS — Z794 Long term (current) use of insulin: Secondary | ICD-10-CM

## 2018-01-28 DIAGNOSIS — Z992 Dependence on renal dialysis: Secondary | ICD-10-CM

## 2018-01-28 DIAGNOSIS — E1152 Type 2 diabetes mellitus with diabetic peripheral angiopathy with gangrene: Secondary | ICD-10-CM | POA: Diagnosis not present

## 2018-01-28 DIAGNOSIS — N186 End stage renal disease: Secondary | ICD-10-CM

## 2018-01-28 DIAGNOSIS — E785 Hyperlipidemia, unspecified: Secondary | ICD-10-CM

## 2018-01-28 DIAGNOSIS — I70262 Atherosclerosis of native arteries of extremities with gangrene, left leg: Secondary | ICD-10-CM

## 2018-01-28 DIAGNOSIS — I70269 Atherosclerosis of native arteries of extremities with gangrene, unspecified extremity: Secondary | ICD-10-CM | POA: Insufficient documentation

## 2018-01-28 NOTE — Patient Instructions (Signed)

## 2018-01-28 NOTE — Assessment & Plan Note (Signed)
lipid control important in reducing the progression of atherosclerotic disease. Continue statin therapy  

## 2018-01-28 NOTE — Progress Notes (Signed)
MRN : [id number]  Terry Tyler is a 67 y.o. (03/30/1951) male who presents with chief complaint of  Chief Complaint  Patient presents with  . Follow-up    1week check with left foot  .  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  He underwent revascularization about a week ago and his foot has begun to demarcate.  On the plantar aspect of the foot, the foot appears viable all the way to the base of the toes.  On the dorsal aspect of the foot, there are fixed skin changes to the midfoot.  The toes are all gangrenous.  There is a foul odor.  No fever or chills.  His dialysis access is working well.  He does have some swelling in both legs but actually has more swelling on the right leg than the left.  No periprocedural complications.  Current Outpatient Medications  Medication Sig Dispense Refill  . acetaminophen (TYLENOL) 500 MG tablet Take 500 mg by mouth every 6 (six) hours as needed.    . Amino Acids-Protein Hydrolys (FEEDING SUPPLEMENT, PRO-STAT SUGAR FREE 64,) LIQD Take 30 mLs by mouth 3 (three) times daily with meals.    Marland Kitchen amoxicillin-clavulanate (AUGMENTIN) 500-125 MG tablet Take 1 tablet (500 mg total) by mouth daily. 4 tablet 0  . aspirin EC 81 MG EC tablet Take 1 tablet (81 mg total) by mouth daily. (Patient taking differently: Take 81 mg by mouth at bedtime. ) 180 tablet 0  . ciprofloxacin (CIPRO) 500 MG tablet Take 1 tablet (500 mg total) by mouth daily. 4 tablet 0  . gabapentin (NEURONTIN) 300 MG capsule Take 300 mg by mouth 3 (three) times daily.    . insulin glargine (LANTUS) 100 UNIT/ML injection Inject 8 Units into the skin daily.     Marland Kitchen loperamide (IMODIUM) 2 MG capsule Take by mouth as needed for diarrhea or loose stools.    . metoprolol tartrate (LOPRESSOR) 25 MG tablet Take 0.5 tablets (12.5 mg total) by mouth 2 (two) times daily. 60 tablet 0  . multivitamin (RENA-VIT) TABS tablet Take 1 tablet by mouth daily.   4  . ondansetron (ZOFRAN) 4 MG tablet Take 1 tablet (4 mg total) by mouth every 6 (six) hours as needed for nausea. 20 tablet 0  . pantoprazole (PROTONIX) 40 MG tablet Take 1 tablet (40 mg total) by mouth 2 (two) times daily before a meal. 60 tablet 1  . rosuvastatin (CRESTOR) 20 MG tablet Take 20 mg by mouth at bedtime.     . vitamin C (ASCORBIC ACID) 500 MG tablet Take 500 mg by mouth 2 (two) times daily.    Marland Kitchen warfarin (COUMADIN) 5 MG tablet Take 1 tablet (5 mg total) by mouth at bedtime. 30 tablet 0  . zinc sulfate 220 (50 Zn) MG capsule Take 220 mg by mouth daily.     No current facility-administered medications for this visit.     Past Medical History:  Diagnosis Date  . C. difficile diarrhea   . CHF (congestive heart failure) ([place])   . CKD (chronic kidney disease)   . Coronary atherosclerosis of unspecified type of vessel, native or graft    Myocardial infarction in 2006. Cardiac catheterization showed significant three-vessel coronary artery disease. He underwent CABG at [facility]. Most recent cardiac catheterization in 2010 showed normal LV systolic function, patent grafts to OM1, RCA and LAD. Occluded SVG to diagonal.  . Diabetes  mellitus without complication ([place])    type I  . Dialysis patient [facility])    Mon. Wed. Fri. for dialysis  . Hyperlipidemia   . Hypertension   . Hypertension, renal disease   . Impotence of organic origin   . Keloid scar   . Myocardial infarction ([place]) 2006  . Neoplasm of uncertain behavior, site unspecified   . Peripheral vascular disease, unspecified ([place])   . Proteinuria   . Renal insufficiency   . Stroke [facility]) 04/2016   TIA  . Unspecified vitamin D deficiency     Past Surgical History:  Procedure Laterality Date  . A/V FISTULAGRAM Left 01/03/2017   Procedure: A/V Fistulagram;  Surgeon: Algernon Huxley, MD;  Location: [place] [facility];  Service: Cardiovascular;  Laterality: Left;  . A/V FISTULAGRAM Left 06/06/2017   Procedure: A/V  FISTULAGRAM;  Surgeon: Algernon Huxley, MD;  Location: [place] [facility];  Service: Cardiovascular;  Laterality: Left;  . A/V FISTULAGRAM Left 09/02/2017   Procedure: A/V FISTULAGRAM;  Surgeon: Algernon Huxley, MD;  Location: [place] [facility];  Service: Cardiovascular;  Laterality: Left;  . A/V SHUNT INTERVENTION N/A 01/03/2017   Procedure: A/V Shunt Intervention;  Surgeon: Algernon Huxley, MD;  Location: [place] [facility];  Service: Cardiovascular;  Laterality: N/A;  . A/V SHUNT INTERVENTION N/A 06/06/2017   Procedure: A/V SHUNT INTERVENTION;  Surgeon: Algernon Huxley, MD;  Location: [place] [facility];  Service: Cardiovascular;  Laterality: N/A;  . AV FISTULA PLACEMENT Left 10/25/2016   Procedure: ARTERIOVENOUS (AV) FISTULA CREATION ( RADIOCEPHALIC );  Surgeon: Algernon Huxley, MD;  Location: [facility] OR[facility];  Service: Vascular;  Laterality: Left;  . CARDIAC CATHETERIZATION  12/2004   [facility]  . CARDIAC CATHETERIZATION  09/2008   [facility]  . COLONOSCOPY WITH PROPOFOL N/A 05/09/2017   Procedure: COLONOSCOPY WITH PROPOFOL;  Surgeon: Toledo, Benay Pike, MD;  Location: [facility] END[facility];  Service: Gastroenterology;  Laterality: N/A;  . CORONARY ANGIOPLASTY    . CORONARY ARTERY BYPASS GRAFT  2006   [facility]  . CORONARY STENT INTERVENTION N/A 09/10/2016   Procedure: Coronary Stent Intervention;  Surgeon: Isaias Cowman, MD;  Location: [place] [facility];  Service: Cardiovascular;  Laterality: N/A;  . DIALYSIS/PERMA CATHETER REMOVAL N/A 02/14/2017   Procedure: DIALYSIS/PERMA CATHETER REMOVAL;  Surgeon: Algernon Huxley, MD;  Location: [place] [facility];  Service: Cardiovascular;  Laterality: N/A;  . ESOPHAGOGASTRODUODENOSCOPY (EGD) WITH PROPOFOL N/A 05/08/2017   Procedure: ESOPHAGOGASTRODUODENOSCOPY (EGD) WITH PROPOFOL;  Surgeon: Toledo, Benay Pike, MD;  Location: [facility];  Service: Gastroenterology;  Laterality: N/A;  . keloid removal     right neck  . LEFT HEART CATH AND CORONARY ANGIOGRAPHY N/A  09/10/2016   Procedure: Left Heart Cath and Coronary Angiography;  Surgeon: Isaias Cowman, MD;  Location: [place] [facility];  Service: Cardiovascular;  Laterality: N/A;  . LOWER EXTREMITY ANGIOGRAPHY Left 01/15/2018   Procedure: LOWER EXTREMITY ANGIOGRAPHY;  Surgeon: Algernon Huxley, MD;  Location: [place] [facility];  Service: Cardiovascular;  Laterality: Left;    Social History Social History   Tobacco Use  . Smoking status: Former Smoker    Packs/day: 0.50    Years: 0.00    Pack years: 0.00    Types: Cigarettes    Last attempt to quit: 06/11/1980    Years since quitting: 37.6  . Smokeless tobacco: Never Used  Substance Use Topics  . Alcohol use: No    Comment: socially  . Drug use: No  Family History Family History  Problem Relation Age of Onset  . Hypertension Mother   . Hyperlipidemia Mother   . Heart disease Mother   . Heart disease Father   . Hyperlipidemia Father   . Hypertension Father   . Kidney disease Sister   . Diabetes Sister   . Hypertension Sister   . Diabetes Brother   . Hypertension Brother   . HIV/AIDS Brother      Allergies  Allergen Reactions  . Lisinopril Swelling  . Atorvastatin Calcium Other (See Comments)    Muscle cramps  Muscle cramps  Other reaction(s): Other (See Comments), Other (See Comments), Unknown Muscle cramping Muscle cramps  . Ezetimibe Other (See Comments)    Cannot remember what the allergy was. Other reaction(s): Other (See Comments), Other (See Comments), Unknown Cannot remember what the allergy was.  . Lipitor [Atorvastatin] Other (See Comments)    Muscle cramping     REVIEW OF SYSTEMS (Negative unless checked)  Constitutional: '[]'$ Weight loss  '[]'$ Fever  '[]'$ Chills Cardiac: '[]'$ Chest pain   '[]'$ Chest pressure   '[]'$ Palpitations   '[]'$ Shortness of breath when laying flat   '[]'$ Shortness of breath at rest   '[x]'$ Shortness of breath with exertion. Vascular:  '[]'$ Pain in legs with walking   '[]'$ Pain in legs at rest   '[]'$ Pain in  legs when laying flat   '[]'$ Claudication   '[]'$ Pain in feet when walking  '[]'$ Pain in feet at rest  '[]'$ Pain in feet when laying flat   '[]'$ History of DVT   '[]'$ Phlebitis   '[]'$ Swelling in legs   '[]'$ Varicose veins   '[x]'$ Non-healing ulcers Pulmonary:   '[]'$ Uses home oxygen   '[]'$ Productive cough   '[]'$ Hemoptysis   '[]'$ Wheeze  '[]'$ COPD   '[]'$ Asthma Neurologic:  '[]'$ Dizziness  '[]'$ Blackouts   '[]'$ Seizures   '[]'$ History of stroke   '[]'$ History of TIA  '[]'$ Aphasia   '[]'$ Temporary blindness   '[]'$ Dysphagia   '[]'$ Weakness or numbness in arms   '[]'$ Weakness or numbness in legs Musculoskeletal:  '[]'$ Arthritis   '[]'$ Joint swelling   '[]'$ Joint pain   '[]'$ Low back pain Hematologic:  '[]'$ Easy bruising  '[]'$ Easy bleeding   '[]'$ Hypercoagulable state   '[]'$ Anemic   Gastrointestinal:  '[]'$ Blood in stool   '[]'$ Vomiting blood  '[]'$ Gastroesophageal reflux/heartburn   '[]'$ Abdominal pain Genitourinary:  '[x]'$ Chronic kidney disease   '[]'$ Difficult urination  '[]'$ Frequent urination  '[]'$ Burning with urination   '[]'$ Hematuria Skin:  '[]'$ Rashes   '[x]'$ Ulcers   '[x]'$ Wounds Psychological:  '[]'$ History of anxiety   '[]'$  History of major depression.  Physical Examination  BP 116/75 (BP Location: Right Arm)   Pulse 77   Resp 16   Ht 6' (1.829 m)   Wt 210 lb (95.3 kg)   BMI 28.48 kg/m  Gen:  WD/WN, NAD Head: [place]/AT, No temporalis wasting. Ear/Nose/Throat: Hearing grossly intact, nares w/o erythema or drainage Eyes: Conjunctiva clear. Sclera non-icteric Neck: Supple.  Trachea midline Pulmonary:  Good air movement, no use of accessory muscles.  Cardiac: Irregular Vascular: Good thrill in left arm AV fistula Vessel Right Left  Radial Palpable Palpable                          PT  not palpable  not palpable  DP  1+ palpable  not palpable    Musculoskeletal: M/S 5/5 throughout. Fixed skin changes to the dorsal aspect of the forefoot and all the toes are clearly gangrenous.  No surrounding erythema.  Motor and sensory function are surprisingly intact.  2+ bilateral lower extremity edema. Neurologic:  Sensation grossly intact in extremities.  Symmetrical.  Speech is fluent.  Psychiatric: Judgment intact, Mood & affect appropriate for pt's clinical situation. Dermatologic: Left foot as above       Labs Recent Results (from the past 2160 hour(s))  Comprehensive metabolic panel     Status: Abnormal   Collection Time: 11/04/17  2:21 PM  Result Value Ref Range   Sodium 135 135 - 145 mmol/L   Potassium 3.5 3.5 - 5.1 mmol/L   Chloride 93 (L) 101 - 111 mmol/L   CO2 30 22 - 32 mmol/L   Glucose, Bld 135 (H) 65 - 99 mg/dL   BUN 25 (H) 6 - 20 mg/dL   Creatinine, Ser 4.54 (H) 0.61 - 1.24 mg/dL   Calcium 8.1 (L) 8.9 - 10.3 mg/dL   Total Protein 8.5 (H) 6.5 - 8.1 g/dL   Albumin 3.0 (L) 3.5 - 5.0 g/dL   AST 44 (H) 15 - 41 U/L   ALT 23 17 - 63 U/L   Alkaline Phosphatase 381 (H) 38 - 126 U/L   Total Bilirubin 1.6 (H) 0.3 - 1.2 mg/dL   GFR calc non Af Amer 12 (L) >60 mL/min   GFR calc Af Amer 14 (L) >60 mL/min    Comment: (NOTE) The eGFR has been calculated using the CKD EPI equation. This calculation has not been validated in all clinical situations. eGFR's persistently <60 mL/min signify possible Chronic Kidney Disease.    Anion gap 12 5 - 15    Comment: Performed at [facility], [address] [place], [place] [address]  Lipase, blood     Status: None   Collection Time: 11/04/17  2:21 PM  Result Value Ref Range   Lipase 36 11 - 51 U/L    Comment: Performed at [facility], [address] [place], [place] [address]  CBC with Differential     Status: Abnormal   Collection Time: 11/04/17  2:21 PM  Result Value Ref Range   WBC 7.9 3.8 - 10.6 K/uL   RBC 3.87 (L) 4.40 - 5.90 MIL/uL   Hemoglobin 10.5 (L) 13.0 - 18.0 g/dL   HCT 31.2 (L) 40.0 - 52.0 %   MCV 80.8 80.0 - 100.0 fL   MCH 27.2 26.0 - 34.0 pg   MCHC 33.6 32.0 - 36.0 g/dL   RDW 19.0 (H) 11.5 - 14.5 %   Platelets 117 (L) 150 - 440 K/uL   Neutrophils Relative % 78 %   Neutro Abs 6.1 1.4 - 6.5 K/uL    Lymphocytes Relative 8 %   Lymphs Abs 0.7 (L) 1.0 - 3.6 K/uL   Monocytes Relative 8 %   Monocytes Absolute 0.6 0.2 - 1.0 K/uL   Eosinophils Relative 5 %   Eosinophils Absolute 0.4 0 - 0.7 K/uL   Basophils Relative 1 %   Basophils Absolute 0.1 0 - 0.1 K/uL    Comment: Performed at [facility], [address] [place], [place] [address]  Troponin I     Status: Abnormal   Collection Time: 11/04/17  2:21 PM  Result Value Ref Range   Troponin I 0.06 (HH) <0.03 ng/mL    Comment: CRITICAL RESULT CALLED TO, READ BACK BY AND VERIFIED WITH DEVETTA MCCLEAN AT 1546 ON 11/04/2017 JJB Performed at [facility], [address], [place], [place] [address]   Brain natriuretic peptide     Status: Abnormal   Collection Time: 11/04/17  2:21 PM  Result Value Ref Range  B Natriuretic Peptide 1,468.0 (H) 0.0 - 100.0 pg/mL    Comment: Performed at [facility], [address] [place], [place] [address]  Basic metabolic panel     Status: Abnormal   Collection Time: 11/25/17 11:44 AM  Result Value Ref Range   Sodium 136 135 - 145 mmol/L   Potassium 3.4 (L) 3.5 - 5.1 mmol/L   Chloride 93 (L) 101 - 111 mmol/L   CO2 27 22 - 32 mmol/L   Glucose, Bld 143 (H) 65 - 99 mg/dL   BUN 14 6 - 20 mg/dL   Creatinine, Ser 3.40 (H) 0.61 - 1.24 mg/dL   Calcium 7.9 (L) 8.9 - 10.3 mg/dL   GFR calc non Af Amer 18 (L) >60 mL/min   GFR calc Af Amer 20 (L) >60 mL/min    Comment: (NOTE) The eGFR has been calculated using the CKD EPI equation. This calculation has not been validated in all clinical situations. eGFR's persistently <60 mL/min signify possible Chronic Kidney Disease.    Anion gap 16 (H) 5 - 15    Comment: Performed at [facility], [address] [place], [place] [address]  CBC     Status: Abnormal   Collection Time: 11/25/17 11:44 AM  Result Value Ref Range   WBC 8.8 3.8 - 10.6 K/uL   RBC 3.90 (L) 4.40 - 5.90 MIL/uL   Hemoglobin 10.4 (L) 13.0 - 18.0 g/dL   HCT  30.3 (L) 40.0 - 52.0 %   MCV 77.7 (L) 80.0 - 100.0 fL   MCH 26.5 26.0 - 34.0 pg   MCHC 34.1 32.0 - 36.0 g/dL   RDW 16.6 (H) 11.5 - 14.5 %   Platelets 251 150 - 440 K/uL    Comment: Performed at [facility], [address] [place], [place] [address]  Hepatic function panel     Status: Abnormal   Collection Time: 11/25/17 11:44 AM  Result Value Ref Range   Total Protein 8.2 (H) 6.5 - 8.1 g/dL   Albumin 2.7 (L) 3.5 - 5.0 g/dL   AST 29 15 - 41 U/L   ALT 16 (L) 17 - 63 U/L   Alkaline Phosphatase 285 (H) 38 - 126 U/L   Total Bilirubin 1.2 0.3 - 1.2 mg/dL   Bilirubin, Direct 0.6 (H) 0.1 - 0.5 mg/dL   Indirect Bilirubin 0.6 0.3 - 0.9 mg/dL    Comment: Performed at [facility], [address] [place], [place] [address]  Hepatitis panel, acute     Status: None   Collection Time: 11/25/17  7:52 PM  Result Value Ref Range   Hepatitis B Surface Ag Negative Negative   HCV Ab <0.1 0.0 - 0.9 s/co ratio    Comment: (NOTE)                                  Negative:     < 0.8                             Indeterminate: 0.8 - 0.9                                  Positive:     > 0.9 The CDC recommends that a positive HCV antibody result be followed up with a HCV Nucleic Acid  Amplification test (962836). Performed At: [facility] [place], [place] [id number] Rush Farmer MD [id number]    Hep A IgM Negative Negative   Hep B C IgM Negative Negative    Comment: Performed at [facility], [address] [place], [place] [address]  Protime-INR     Status: Abnormal   Collection Time: 11/25/17  7:52 PM  Result Value Ref Range   Prothrombin Time 18.8 (H) 11.4 - 15.2 seconds   INR 1.59     Comment: Performed at [facility], [address] [place], [place] [address]  HIV antibody (Routine Testing)     Status: None   Collection Time: 11/25/17  7:52 PM  Result Value Ref Range   HIV Screen 4th Generation wRfx Non Reactive Non  Reactive    Comment: (NOTE) Performed At: [facility] [place], [place] [id number] Rush Farmer MD [id number] Performed at [facility], [address] [place], [place] [address]   Glucose, capillary     Status: Abnormal   Collection Time: 11/25/17  9:36 PM  Result Value Ref Range   Glucose-Capillary 173 (H) 65 - 99 mg/dL  Hemoglobin A1c     Status: Abnormal   Collection Time: 11/26/17  3:51 AM  Result Value Ref Range   Hgb A1c MFr Bld 6.7 (H) 4.8 - 5.6 %    Comment: (NOTE) Pre diabetes:          5.7%-6.4% Diabetes:              >6.4% Glycemic control for   <7.0% adults with diabetes    Mean Plasma Glucose 145.59 mg/dL    Comment: Performed at [facility], [place] [address] [place], [place] [address]  Lipid panel     Status: Abnormal   Collection Time: 11/26/17  3:51 AM  Result Value Ref Range   Cholesterol 82 0 - 200 mg/dL   Triglycerides 84 <150 mg/dL   HDL 34 (L) >40 mg/dL   Total CHOL/HDL Ratio 2.4 RATIO   VLDL 17 0 - 40 mg/dL   LDL Cholesterol 31 0 - 99 mg/dL    Comment:        Total Cholesterol/HDL:CHD Risk Coronary Heart Disease Risk Table                     Men   Women  1/2 Average Risk   3.4   3.3  Average Risk       5.0   4.4  2 X Average Risk   9.6   7.1  3 X Average Risk  23.4   11.0        Use the calculated Patient Ratio above and the CHD Risk Table to determine the patient's CHD Risk.        ATP III CLASSIFICATION (LDL):  <100     mg/dL   Optimal  100-129  mg/dL   Near or Above                    Optimal  130-159  mg/dL   Borderline  160-189  mg/dL   High  >190     mg/dL   Very High Performed at [facility], [address] [place], [place] [address]   Protime-INR     Status: Abnormal   Collection Time: 11/26/17  3:51 AM  Result Value Ref Range   Prothrombin Time 20.0 (H) 11.4 - 15.2 seconds  INR 1.72     Comment: Performed at [facility], [address] [place], [place]  [address]  Glucose, capillary     Status: Abnormal   Collection Time: 11/26/17  7:42 AM  Result Value Ref Range   Glucose-Capillary 189 (H) 65 - 99 mg/dL  Lactate dehydrogenase (pleural or peritoneal fluid)     Status: Abnormal   Collection Time: 11/26/17 10:30 AM  Result Value Ref Range   LD, Fluid 119 (H) 3 - 23 U/L   Fluid Type-FLDH PERITONEAL     Comment: Performed at [facility], [address] [place], [place] [address] CORRECTED ON 06/18 AT 3903: PREVIOUSLY REPORTED AS CYTOPERI   Body fluid cell count with differential     Status: Abnormal   Collection Time: 11/26/17 10:30 AM  Result Value Ref Range   Fluid Type-FCT PERITONEAL     Comment: CORRECTED ON 06/18 AT 1111: PREVIOUSLY REPORTED AS CYTOPERI   Color, Fluid YELLOW YELLOW   Appearance, Fluid CLOUDY (A) CLEAR   WBC, Fluid 723 cu mm   Neutrophil Count, Fluid 42 %   Lymphs, Fluid 19 %   Monocyte-Macrophage-Serous Fluid 39 %   Eos, Fluid 0 %    Comment: Performed at [facility], [address] [place], [place] [address]  Albumin, pleural or peritoneal fluid     Status: None   Collection Time: 11/26/17 10:30 AM  Result Value Ref Range   Albumin, Fluid 1.8 g/dL   Fluid Type-FALB PERITONEAL     Comment: Performed at [facility], [address] [place], [place] [address] CORRECTED ON 06/18 AT 2263: PREVIOUSLY REPORTED AS CYTOPERI   Protein, pleural or peritoneal fluid     Status: None   Collection Time: 11/26/17 10:30 AM  Result Value Ref Range   Total protein, fluid 4.7 g/dL   Fluid Type-FTP PERITONEAL     Comment: Performed at [facility], [address] [place], [place] [address] CORRECTED ON 06/18 AT 6256: PREVIOUSLY REPORTED AS CYTOPERI   Acid Fast Culture with reflexed sensitivities     Status: None   Collection Time: 11/26/17 10:30 AM  Result Value Ref Range   Acid Fast Culture Negative     Comment: (NOTE) No acid fast bacilli isolated after 6 weeks. Performed At:  [facility] [place], [place] [id number] Rush Farmer MD [id number]    Source of Sample PERITONEAL     Comment: Performed at [facility], [address] [place], [place] [address]  Acid Fast Smear (AFB)     Status: None   Collection Time: 11/26/17 10:30 AM  Result Value Ref Range   AFB Specimen Processing Concentration    Acid Fast Smear Negative     Comment: (NOTE) Performed At: [facility] [address] [place], [place] [id number] Rush Farmer MD [id number]    Source (AFB) PERITONEAL     Comment: Performed at [facility], [address] [place], [place] [address]  Lipase, Fluid     Status: None   Collection Time: 11/26/17 10:30 AM  Result Value Ref Range   Lipase-Fluid 12 U/L    Comment: (NOTE) INTERPRETIVE INFORMATION: Lipase, Fluid For information on body fluid reference ranges and/or interpretive guidance visit [email or web address] Test developed and characteristics determined by [facility]. See Compliance Statement B: [email or web address] Performed At: [facility] [address] [place], [place] [id number] Esau Grew MD [id number]    Source of Sample PERITONEAL  Comment: Performed at [facility], [address] [place], [place] [address]  Body fluid culture     Status: None   Collection Time: 11/26/17 10:30 AM  Result Value Ref Range   Specimen Description      PERITONEAL Performed at [facility], [address] [place], [place] [address]    Special Requests      PERITONEAL Performed at [facility], [place], [place] [address]    Gram Stain      MODERATE WBC PRESENT,BOTH PMN AND MONONUCLEAR NO ORGANISMS SEEN    Culture      NO GROWTH 3 DAYS Performed at [facility], [place] [address], [place], [place] [address]    Report Status 11/29/2017 FINAL   Pathologist smear review     Status: None   Collection  Time: 11/26/17 10:30 AM  Result Value Ref Range   Path Review Cytospin of peritoneal fluid is reviewed.     Comment: Patient with ESRD on dialysis who presents with abdominal distension. Negative for malignancy. Absolute neutrophil count 304 cells per cu mm. Microbiology studies are pending. Reviewed by Dellia Nims Reuel Derby, M.D. Performed at [facility], [address] [place], [place] [address]   Glucose, capillary     Status: Abnormal   Collection Time: 11/26/17 12:22 PM  Result Value Ref Range   Glucose-Capillary 171 (H) 65 - 99 mg/dL  ECHOCARDIOGRAM COMPLETE     Status: None   Collection Time: 11/26/17  1:20 PM  Result Value Ref Range   Weight 3,200 oz   Height 72 in   BP 130/77 mmHg  Glucose, capillary     Status: Abnormal   Collection Time: 11/26/17  4:37 PM  Result Value Ref Range   Glucose-Capillary 135 (H) 65 - 99 mg/dL  Glucose, capillary     Status: Abnormal   Collection Time: 11/26/17  9:06 PM  Result Value Ref Range   Glucose-Capillary 118 (H) 65 - 99 mg/dL  Protime-INR     Status: Abnormal   Collection Time: 11/27/17  5:52 AM  Result Value Ref Range   Prothrombin Time 22.2 (H) 11.4 - 15.2 seconds   INR 1.96     Comment: Performed at [facility], [address] [place], [place] [address]  Glucose, capillary     Status: Abnormal   Collection Time: 11/27/17  7:34 AM  Result Value Ref Range   Glucose-Capillary 112 (H) 65 - 99 mg/dL  Glucose, capillary     Status: Abnormal   Collection Time: 11/27/17 11:56 AM  Result Value Ref Range   Glucose-Capillary 155 (H) 65 - 99 mg/dL  Phosphorus     Status: None   Collection Time: 11/27/17  4:46 PM  Result Value Ref Range   Phosphorus 3.4 2.5 - 4.6 mg/dL    Comment: Performed at [facility], [address] [place], [place] [address]  Protime-INR     Status: Abnormal   Collection Time: 11/28/17  3:55 AM  Result Value Ref Range   Prothrombin Time 21.5 (H) 11.4 - 15.2 seconds   INR 1.89       Comment: Performed at [facility], [address] [place], [place] [address]  Glucose, capillary     Status: Abnormal   Collection Time: 11/28/17  7:31 AM  Result Value Ref Range   Glucose-Capillary 175 (H) 65 - 99 mg/dL  Glucose, capillary     Status: Abnormal   Collection Time:  11/28/17  9:37 AM  Result Value Ref Range   Glucose-Capillary 159 (H) 65 - 99 mg/dL  Glucose, capillary     Status: Abnormal   Collection Time: 11/28/17 12:30 PM  Result Value Ref Range   Glucose-Capillary 173 (H) 65 - 99 mg/dL  Glucose, capillary     Status: Abnormal   Collection Time: 11/28/17  3:31 PM  Result Value Ref Range   Glucose-Capillary 185 (H) 65 - 99 mg/dL  Glucose, capillary     Status: Abnormal   Collection Time: 11/28/17  5:09 PM  Result Value Ref Range   Glucose-Capillary 178 (H) 65 - 99 mg/dL  Glucose, capillary     Status: Abnormal   Collection Time: 11/28/17  9:16 PM  Result Value Ref Range   Glucose-Capillary 142 (H) 65 - 99 mg/dL  Protime-INR     Status: Abnormal   Collection Time: 11/29/17  5:38 AM  Result Value Ref Range   Prothrombin Time 23.9 (H) 11.4 - 15.2 seconds   INR 2.16     Comment: Performed at [facility], [address] [place], [place] [address]  Phosphorus     Status: Abnormal   Collection Time: 11/29/17  5:38 AM  Result Value Ref Range   Phosphorus 5.2 (H) 2.5 - 4.6 mg/dL    Comment: Performed at [facility], [address] [place], [place] [address]  Glucose, capillary     Status: Abnormal   Collection Time: 11/29/17  7:41 AM  Result Value Ref Range   Glucose-Capillary 125 (H) 65 - 99 mg/dL  Glucose, capillary     Status: Abnormal   Collection Time: 11/29/17  3:48 PM  Result Value Ref Range   Glucose-Capillary 151 (H) 65 - 99 mg/dL  CBC     Status: Abnormal   Collection Time: 01/10/18  7:45 AM  Result Value Ref Range   WBC 11.3 (H) 3.8 - 10.6 K/uL   RBC 3.56 (L) 4.40 - 5.90 MIL/uL   Hemoglobin 9.5 (L) 13.0 - 18.0  g/dL   HCT 28.7 (L) 40.0 - 52.0 %   MCV 80.8 80.0 - 100.0 fL   MCH 26.6 26.0 - 34.0 pg   MCHC 32.9 32.0 - 36.0 g/dL   RDW 18.1 (H) 11.5 - 14.5 %   Platelets 178 150 - 440 K/uL    Comment: Performed at [facility], [address] [place], [place] [address]  Comprehensive metabolic panel     Status: Abnormal   Collection Time: 01/10/18  7:45 AM  Result Value Ref Range   Sodium 139 135 - 145 mmol/L   Potassium 4.7 3.5 - 5.1 mmol/L   Chloride 98 98 - 111 mmol/L   CO2 29 22 - 32 mmol/L   Glucose, Bld 194 (H) 70 - 99 mg/dL   BUN 30 (H) 8 - 23 mg/dL   Creatinine, Ser 5.85 (H) 0.61 - 1.24 mg/dL   Calcium 8.3 (L) 8.9 - 10.3 mg/dL   Total Protein 7.9 6.5 - 8.1 g/dL   Albumin 2.8 (L) 3.5 - 5.0 g/dL   AST 47 (H) 15 - 41 U/L   ALT 26 0 - 44 U/L   Alkaline Phosphatase 294 (H) 38 - 126 U/L   Total Bilirubin 2.5 (H) 0.3 - 1.2 mg/dL   GFR calc non Af Amer 9 (L) >60 mL/min   GFR calc Af Amer 11 (L) >60 mL/min    Comment: (NOTE) The eGFR has been calculated using the CKD EPI equation.  This calculation has not been validated in all clinical situations. eGFR's persistently <60 mL/min signify possible Chronic Kidney Disease.    Anion gap 12 5 - 15    Comment: Performed at [facility], [address] [place], [place] [address]  Troponin I     Status: Abnormal   Collection Time: 01/10/18  7:45 AM  Result Value Ref Range   Troponin I 0.03 (HH) <0.03 ng/mL    Comment: CRITICAL RESULT CALLED TO, READ BACK BY AND VERIFIED WITH  TERESA CLAPP AT 0831 01/10/18 SDR Performed at [facility], [address] [place], [place] [address]   CBC with Differential     Status: Abnormal   Collection Time: 01/11/18  9:18 PM  Result Value Ref Range   WBC 14.0 (H) 3.8 - 10.6 K/uL   RBC 3.76 (L) 4.40 - 5.90 MIL/uL   Hemoglobin 10.1 (L) 13.0 - 18.0 g/dL   HCT 30.9 (L) 40.0 - 52.0 %   MCV 82.0 80.0 - 100.0 fL   MCH 26.9 26.0 - 34.0 pg   MCHC 32.8 32.0 - 36.0 g/dL   RDW 18.0 (H)  11.5 - 14.5 %   Platelets 175 150 - 440 K/uL   Neutrophils Relative % 88 %   Neutro Abs 12.3 (H) 1.4 - 6.5 K/uL   Lymphocytes Relative 5 %   Lymphs Abs 0.7 (L) 1.0 - 3.6 K/uL   Monocytes Relative 7 %   Monocytes Absolute 1.0 0.2 - 1.0 K/uL   Eosinophils Relative 1 %   Eosinophils Absolute 0.1 0 - 0.7 K/uL   Basophils Relative 1 %   Basophils Absolute 0.1 0 - 0.1 K/uL    Comment: Performed at [facility], [address] [place], [place] [address]  Troponin I     Status: Abnormal   Collection Time: 01/11/18  9:18 PM  Result Value Ref Range   Troponin I 0.05 (HH) <0.03 ng/mL    Comment: CRITICAL RESULT CALLED TO, READ BACK BY AND VERIFIED WITH JOHN HOFFMASTER ON 01/11/18 AT 2234 [facility] Performed at [facility], [address] [place], [place] [address]   Basic metabolic panel     Status: Abnormal   Collection Time: 01/11/18  9:18 PM  Result Value Ref Range   Sodium 135 135 - 145 mmol/L   Potassium 5.1 3.5 - 5.1 mmol/L    Comment: HEMOLYSIS AT THIS LEVEL MAY AFFECT RESULT   Chloride 95 (L) 98 - 111 mmol/L   CO2 25 22 - 32 mmol/L   Glucose, Bld 234 (H) 70 - 99 mg/dL   BUN 43 (H) 8 - 23 mg/dL   Creatinine, Ser 7.58 (H) 0.61 - 1.24 mg/dL   Calcium 8.6 (L) 8.9 - 10.3 mg/dL   GFR calc non Af Amer 7 (L) >60 mL/min   GFR calc Af Amer 8 (L) >60 mL/min    Comment: (NOTE) The eGFR has been calculated using the CKD EPI equation. This calculation has not been validated in all clinical situations. eGFR's persistently <60 mL/min signify possible Chronic Kidney Disease.    Anion gap 15 5 - 15    Comment: Performed at [facility], [address] [place], [place] [address]  Hepatic function panel     Status: Abnormal   Collection Time: 01/11/18  9:18 PM  Result Value Ref Range   Total Protein 8.6 (H) 6.5 - 8.1 g/dL   Albumin 2.9 (L) 3.5 - 5.0 g/dL   AST 57 (H) 15 -  41 U/L   ALT 22 0 - 44 U/L   Alkaline Phosphatase 290 (H) 38 - 126 U/L   Total Bilirubin 4.0  (H) 0.3 - 1.2 mg/dL   Bilirubin, Direct 2.3 (H) 0.0 - 0.2 mg/dL   Indirect Bilirubin 1.7 (H) 0.3 - 0.9 mg/dL    Comment: Performed at [facility], [address] [place], [place] [address]  Lipase, blood     Status: None   Collection Time: 01/11/18  9:18 PM  Result Value Ref Range   Lipase 26 11 - 51 U/L    Comment: Performed at [facility], [address] [place], [place] [address]  Brain natriuretic peptide     Status: Abnormal   Collection Time: 01/11/18 10:52 PM  Result Value Ref Range   B Natriuretic Peptide 2,375.0 (H) 0.0 - 100.0 pg/mL    Comment: Performed at [facility], [address] [place], [place] [address]  Glucose, capillary     Status: Abnormal   Collection Time: 01/12/18  1:18 AM  Result Value Ref Range   Glucose-Capillary 210 (H) 70 - 99 mg/dL  C difficile quick scan w PCR reflex     Status: None   Collection Time: 01/12/18  3:34 AM  Result Value Ref Range   C Diff antigen NEGATIVE NEGATIVE   C Diff toxin NEGATIVE NEGATIVE   C Diff interpretation No C. difficile detected.     Comment: Performed at [facility], [address] [place], [place] [address]  Norovirus group 1 & 2 by PCR, stool     Status: None   Collection Time: 01/12/18  3:34 AM  Result Value Ref Range   Norovirus 1 by PCR Negative Negative   Norovirus 2  by PCR Negative Negative    Comment: (NOTE) This test was developed and its performance characteristics determined by [facility].  It has not been cleared or approved by the Food and Drug Administration.  The FDA has determined that such clearance or approval is not necessary. Performed At: [facility] [place], [place] [id number] Rush Farmer MD OV:[id number]   Protime-INR     Status: Abnormal   Collection Time: 01/12/18  5:06 AM  Result Value Ref Range   Prothrombin Time 30.4 (H) 11.4 - 15.2 seconds   INR 2.94     Comment: Performed at [facility], [address] [place], [place] [address]  Basic metabolic panel     Status: Abnormal   Collection Time: 01/12/18  5:06 AM  Result Value Ref Range   Sodium 134 (L) 135 - 145 mmol/L   Potassium 4.5 3.5 - 5.1 mmol/L   Chloride 96 (L) 98 - 111 mmol/L   CO2 23 22 - 32 mmol/L   Glucose, Bld 236 (H) 70 - 99 mg/dL   BUN 45 (H) 8 - 23 mg/dL   Creatinine, Ser 7.83 (H) 0.61 - 1.24 mg/dL   Calcium 8.6 (L) 8.9 - 10.3 mg/dL   GFR calc non Af Amer 6 (L) >60 mL/min   GFR calc Af Amer 7 (L) >60 mL/min    Comment: (NOTE) The eGFR has been calculated using the CKD EPI equation. This calculation has not been validated in all clinical situations. eGFR's persistently <60 mL/min signify possible Chronic Kidney Disease.    Anion gap 15 5 - 15    Comment: Performed at [facility], [address] [place],  [address]  CBC     Status: Abnormal  Collection Time: 01/12/18  5:06 AM  Result Value Ref Range   WBC 14.3 (H) 3.8 - 10.6 K/uL   RBC 3.46 (L) 4.40 - 5.90 MIL/uL   Hemoglobin 9.4 (L) 13.0 - 18.0 g/dL   HCT 28.1 (L) 40.0 - 52.0 %   MCV 81.3 80.0 - 100.0 fL   MCH 27.2 26.0 - 34.0 pg   MCHC 33.5 32.0 - 36.0 g/dL   RDW 18.6 (H) 11.5 - 14.5 %   Platelets 191 150 - 440 K/uL    Comment: Performed at [facility], [address] [place], [place] [address]  Glucose, capillary     Status: Abnormal   Collection Time: 01/12/18  7:53 AM  Result Value Ref Range   Glucose-Capillary 239 (H) 70 - 99 mg/dL  MRSA PCR Screening     Status: None   Collection Time: 01/12/18  8:24 AM  Result Value Ref Range   MRSA by PCR NEGATIVE NEGATIVE    Comment:        The GeneXpert MRSA Assay (FDA approved for NASAL specimens only), is one component of a comprehensive MRSA colonization surveillance program. It is not intended to diagnose MRSA infection nor to guide or monitor treatment for MRSA infections. Performed at [facility], [address] [place], [place] [address]   Glucose,  capillary     Status: Abnormal   Collection Time: 01/12/18 12:06 PM  Result Value Ref Range   Glucose-Capillary 207 (H) 70 - 99 mg/dL  Phosphorus     Status: Abnormal   Collection Time: 01/12/18  1:16 PM  Result Value Ref Range   Phosphorus 5.8 (H) 2.5 - 4.6 mg/dL    Comment: Performed at [facility], [address] [place], [place] [address]  Parathyroid hormone, intact (no Ca)     Status: Abnormal   Collection Time: 01/12/18  1:16 PM  Result Value Ref Range   PTH 378 (H) 15 - 65 pg/mL    Comment: (NOTE) Performed At: [facility] [address] [place], [place] [id number] Rush Farmer MD LY:[id number]   Glucose, capillary     Status: Abnormal   Collection Time: 01/12/18  5:12 PM  Result Value Ref Range   Glucose-Capillary 131 (H) 70 - 99 mg/dL  Glucose, capillary     Status: Abnormal   Collection Time: 01/12/18  9:42 PM  Result Value Ref Range   Glucose-Capillary 192 (H) 70 - 99 mg/dL   Comment 1 Notify RN   Protime-INR     Status: Abnormal   Collection Time: 01/13/18  3:36 AM  Result Value Ref Range   Prothrombin Time 29.4 (H) 11.4 - 15.2 seconds   INR 2.81     Comment: Performed at [facility], [address] [place], [place] [address]  CBC     Status: Abnormal   Collection Time: 01/13/18  3:36 AM  Result Value Ref Range   WBC 12.2 (H) 3.8 - 10.6 K/uL   RBC 3.39 (L) 4.40 - 5.90 MIL/uL   Hemoglobin 9.2 (L) 13.0 - 18.0 g/dL   HCT 27.3 (L) 40.0 - 52.0 %   MCV 80.3 80.0 - 100.0 fL   MCH 27.3 26.0 - 34.0 pg   MCHC 33.9 32.0 - 36.0 g/dL   RDW 18.5 (H) 11.5 - 14.5 %   Platelets 172 150 - 440 K/uL    Comment: Performed at [facility], [address] [place], [place] [address]  Basic metabolic panel  Status: Abnormal   Collection Time: 01/13/18  3:36 AM  Result Value Ref Range   Sodium 135 135 - 145 mmol/L   Potassium 3.8 3.5 - 5.1 mmol/L   Chloride 94 (L) 98 - 111 mmol/L   CO2 28 22 - 32 mmol/L   Glucose, Bld 175 (H) 70  - 99 mg/dL   BUN 36 (H) 8 - 23 mg/dL   Creatinine, Ser 6.34 (H) 0.61 - 1.24 mg/dL   Calcium 8.5 (L) 8.9 - 10.3 mg/dL   GFR calc non Af Amer 8 (L) >60 mL/min   GFR calc Af Amer 10 (L) >60 mL/min    Comment: (NOTE) The eGFR has been calculated using the CKD EPI equation. This calculation has not been validated in all clinical situations. eGFR's persistently <60 mL/min signify possible Chronic Kidney Disease.    Anion gap 13 5 - 15    Comment: Performed at [facility], [address] [place], [place] [address]  Phosphorus     Status: None   Collection Time: 01/13/18  3:36 AM  Result Value Ref Range   Phosphorus 4.4 2.5 - 4.6 mg/dL    Comment: Performed at [facility], [address] [place], [place] [address]  Glucose, capillary     Status: Abnormal   Collection Time: 01/13/18  7:24 AM  Result Value Ref Range   Glucose-Capillary 163 (H) 70 - 99 mg/dL  Glucose, capillary     Status: Abnormal   Collection Time: 01/13/18  4:58 PM  Result Value Ref Range   Glucose-Capillary 196 (H) 70 - 99 mg/dL  Glucose, capillary     Status: Abnormal   Collection Time: 01/13/18  9:53 PM  Result Value Ref Range   Glucose-Capillary 215 (H) 70 - 99 mg/dL  Glucose, capillary     Status: Abnormal   Collection Time: 01/14/18 12:02 AM  Result Value Ref Range   Glucose-Capillary 221 (H) 70 - 99 mg/dL  Protime-INR     Status: Abnormal   Collection Time: 01/14/18  3:54 AM  Result Value Ref Range   Prothrombin Time 26.3 (H) 11.4 - 15.2 seconds   INR 2.44     Comment: Performed at [facility], [address] [place], [place] [address]  Renal function panel     Status: Abnormal   Collection Time: 01/14/18  3:54 AM  Result Value Ref Range   Sodium 136 135 - 145 mmol/L   Potassium 4.2 3.5 - 5.1 mmol/L   Chloride 97 (L) 98 - 111 mmol/L   CO2 28 22 - 32 mmol/L   Glucose, Bld 264 (H) 70 - 99 mg/dL   BUN 26 (H) 8 - 23 mg/dL   Creatinine, Ser 4.97 (H) 0.61 - 1.24 mg/dL    Calcium 8.3 (L) 8.9 - 10.3 mg/dL   Phosphorus 3.7 2.5 - 4.6 mg/dL   Albumin 2.5 (L) 3.5 - 5.0 g/dL   GFR calc non Af Amer 11 (L) >60 mL/min   GFR calc Af Amer 13 (L) >60 mL/min    Comment: (NOTE) The eGFR has been calculated using the CKD EPI equation. This calculation has not been validated in all clinical situations. eGFR's persistently <60 mL/min signify possible Chronic Kidney Disease.    Anion gap 11 5 - 15    Comment: Performed at [facility], [address] [place], [place] [address]  CBC     Status: Abnormal   Collection Time: 01/14/18  3:54 AM  Result Value Ref Range  WBC 12.0 (H) 3.8 - 10.6 K/uL   RBC 3.32 (L) 4.40 - 5.90 MIL/uL   Hemoglobin 8.9 (L) 13.0 - 18.0 g/dL   HCT 26.9 (L) 40.0 - 52.0 %   MCV 80.9 80.0 - 100.0 fL   MCH 26.8 26.0 - 34.0 pg   MCHC 33.2 32.0 - 36.0 g/dL   RDW 18.0 (H) 11.5 - 14.5 %   Platelets 185 150 - 440 K/uL    Comment: Performed at [facility], [address] [place], [place] [address]  Glucose, capillary     Status: Abnormal   Collection Time: 01/14/18  7:49 AM  Result Value Ref Range   Glucose-Capillary 240 (H) 70 - 99 mg/dL  Glucose, capillary     Status: Abnormal   Collection Time: 01/14/18 11:45 AM  Result Value Ref Range   Glucose-Capillary 240 (H) 70 - 99 mg/dL  Glucose, capillary     Status: Abnormal   Collection Time: 01/14/18  4:41 PM  Result Value Ref Range   Glucose-Capillary 273 (H) 70 - 99 mg/dL  Glucose, capillary     Status: Abnormal   Collection Time: 01/14/18  9:15 PM  Result Value Ref Range   Glucose-Capillary 212 (H) 70 - 99 mg/dL  Protime-INR     Status: Abnormal   Collection Time: 01/15/18  4:00 AM  Result Value Ref Range   Prothrombin Time 22.7 (H) 11.4 - 15.2 seconds   INR 2.02     Comment: Performed at [facility], [address] [place], [place] [address]  CBC     Status: Abnormal   Collection Time: 01/15/18  4:00 AM  Result Value Ref Range   WBC 11.6 (H) 3.8 - 10.6  K/uL   RBC 3.48 (L) 4.40 - 5.90 MIL/uL   Hemoglobin 9.4 (L) 13.0 - 18.0 g/dL   HCT 28.2 (L) 40.0 - 52.0 %   MCV 81.0 80.0 - 100.0 fL   MCH 26.9 26.0 - 34.0 pg   MCHC 33.2 32.0 - 36.0 g/dL   RDW 18.2 (H) 11.5 - 14.5 %   Platelets 178 150 - 440 K/uL    Comment: Performed at [facility], [address] [place], [place] [address]  Glucose, capillary     Status: Abnormal   Collection Time: 01/15/18  7:47 AM  Result Value Ref Range   Glucose-Capillary 185 (H) 70 - 99 mg/dL   Comment 1 Notify RN   Glucose, capillary     Status: Abnormal   Collection Time: 01/15/18 11:37 AM  Result Value Ref Range   Glucose-Capillary 159 (H) 70 - 99 mg/dL   Comment 1 Notify RN   Glucose, capillary     Status: Abnormal   Collection Time: 01/15/18  4:06 PM  Result Value Ref Range   Glucose-Capillary 138 (H) 70 - 99 mg/dL  Glucose, capillary     Status: Abnormal   Collection Time: 01/15/18  4:52 PM  Result Value Ref Range   Glucose-Capillary 124 (H) 70 - 99 mg/dL   Comment 1 Notify RN   Renal function panel     Status: Abnormal   Collection Time: 01/15/18  9:55 PM  Result Value Ref Range   Sodium 132 (L) 135 - 145 mmol/L   Potassium 4.4 3.5 - 5.1 mmol/L   Chloride 94 (L) 98 - 111 mmol/L   CO2 24 22 - 32 mmol/L   Glucose, Bld 147 (H) 70 - 99 mg/dL   BUN 40 (H) 8 - 23  mg/dL   Creatinine, Ser 7.16 (H) 0.61 - 1.24 mg/dL   Calcium 8.3 (L) 8.9 - 10.3 mg/dL   Phosphorus 5.4 (H) 2.5 - 4.6 mg/dL   Albumin 2.4 (L) 3.5 - 5.0 g/dL   GFR calc non Af Amer 7 (L) >60 mL/min   GFR calc Af Amer 8 (L) >60 mL/min    Comment: (NOTE) The eGFR has been calculated using the CKD EPI equation. This calculation has not been validated in all clinical situations. eGFR's persistently <60 mL/min signify possible Chronic Kidney Disease.    Anion gap 14 5 - 15    Comment: Performed at [facility], [address] [place], [place] [address]  CBC     Status: Abnormal   Collection Time: 01/15/18  9:55 PM   Result Value Ref Range   WBC 13.4 (H) 3.8 - 10.6 K/uL   RBC 3.60 (L) 4.40 - 5.90 MIL/uL   Hemoglobin 9.6 (L) 13.0 - 18.0 g/dL   HCT 28.9 (L) 40.0 - 52.0 %   MCV 80.2 80.0 - 100.0 fL   MCH 26.7 26.0 - 34.0 pg   MCHC 33.2 32.0 - 36.0 g/dL   RDW 17.4 (H) 11.5 - 14.5 %   Platelets 195 150 - 440 K/uL    Comment: Performed at [facility], [address] [place], [place] [address]  Protime-INR     Status: Abnormal   Collection Time: 01/16/18  3:44 AM  Result Value Ref Range   Prothrombin Time 21.8 (H) 11.4 - 15.2 seconds   INR 1.92     Comment: Performed at [facility], [address] [place], [place] [address]  Renal function panel     Status: Abnormal   Collection Time: 01/16/18  3:44 AM  Result Value Ref Range   Sodium 136 135 - 145 mmol/L   Potassium 4.1 3.5 - 5.1 mmol/L   Chloride 96 (L) 98 - 111 mmol/L   CO2 27 22 - 32 mmol/L   Glucose, Bld 197 (H) 70 - 99 mg/dL   BUN 24 (H) 8 - 23 mg/dL   Creatinine, Ser 4.81 (H) 0.61 - 1.24 mg/dL   Calcium 8.4 (L) 8.9 - 10.3 mg/dL   Phosphorus 3.6 2.5 - 4.6 mg/dL   Albumin 2.5 (L) 3.5 - 5.0 g/dL   GFR calc non Af Amer 12 (L) >60 mL/min   GFR calc Af Amer 13 (L) >60 mL/min    Comment: (NOTE) The eGFR has been calculated using the CKD EPI equation. This calculation has not been validated in all clinical situations. eGFR's persistently <60 mL/min signify possible Chronic Kidney Disease.    Anion gap 13 5 - 15    Comment: Performed at [facility], [address] [place], [place] [address]  CBC     Status: Abnormal   Collection Time: 01/16/18  3:44 AM  Result Value Ref Range   WBC 14.3 (H) 3.8 - 10.6 K/uL   RBC 3.75 (L) 4.40 - 5.90 MIL/uL   Hemoglobin 10.0 (L) 13.0 - 18.0 g/dL   HCT 29.9 (L) 40.0 - 52.0 %   MCV 79.7 (L) 80.0 - 100.0 fL   MCH 26.6 26.0 - 34.0 pg   MCHC 33.3 32.0 - 36.0 g/dL   RDW 17.6 (H) 11.5 - 14.5 %   Platelets 177 150 - 440 K/uL    Comment: Performed at [facility],  [address], [place], [place] [address]  Glucose, capillary     Status:  Abnormal   Collection Time: 01/16/18  7:39 AM  Result Value Ref Range   Glucose-Capillary 239 (H) 70 - 99 mg/dL   Comment 1 Notify RN   Glucose, capillary     Status: Abnormal   Collection Time: 01/16/18 11:40 AM  Result Value Ref Range   Glucose-Capillary 247 (H) 70 - 99 mg/dL   Comment 1 Notify RN   Glucose, capillary     Status: Abnormal   Collection Time: 01/16/18  4:27 PM  Result Value Ref Range   Glucose-Capillary 278 (H) 70 - 99 mg/dL   Comment 1 Notify RN     Radiology Ct Abdomen Pelvis W Contrast  Result Date: 01/11/2018 CLINICAL DATA:  Weakness.  Acute abdominal pain.  Fever. EXAM: CT ABDOMEN AND PELVIS WITH CONTRAST TECHNIQUE: Multidetector CT imaging of the abdomen and pelvis was performed using the standard protocol following bolus administration of intravenous contrast. CONTRAST:  184m OMNIPAQUE IOHEXOL 300 MG/ML  SOLN COMPARISON:  Most recent abdominal CT 11/25/2017 FINDINGS: Lower chest: The heart is enlarged. Coronary artery calcifications versus stents. Small left pleural effusion. Scattered linear atelectasis in both lower lobes. Hepatobiliary: Liver is enlarged. Tiny hypodensity in the right hepatic lobe is unchanged and may represent a cyst, too small to characterize. No suspicious enhancing lesion. Gallstone within the gallbladder without abnormal gallbladder distention. No biliary dilatation. Pancreas: Unremarkable. No pancreatic ductal dilatation or surrounding inflammatory changes. Spleen: Splenomegaly, spleen measures 15.9 cm cranial caudal. No focal abnormality. Adrenals/Urinary Tract: Bilateral adrenal thickening. Bilateral renal atrophy without hydronephrosis. Cyst in the posterior right kidney with additional partially calcified lesion, incompletely characterize but likely cyst. Cyst in the medial left kidney. Urinary bladder completely empty. Stomach/Bowel: Prominent intra-abdominal ascites  and lack of enteric contrast limits bowel evaluation. Patulous distal esophagus. Stomach distended with air in fluid. Increased air throughout nondilated small bowel. Small volume of colonic stool. Normal appendix. Vascular/Lymphatic: Advanced aortic and branch atherosclerosis. No aneurysm. No bulky adenopathy. Multiple small retroperitoneal nodes are unchanged from prior exam. Reproductive: Prostate is unremarkable. Other: Large volume intra-abdominal ascites. Diffuse mesenteric edema and body wall edema. No free air. Musculoskeletal: Multilevel degenerative change in the spine. There are no acute or suspicious osseous abnormalities. IMPRESSION: 1. Large volume ascites, slightly increased in degree compared to CT 6 weeks prior. 2. Slight increased air within nondistended small bowel, nonspecific as to ileus versus enteritis. No evidence of primary bowel inflammation, however lack contrast and presence of ascites limits assessment. No bowel obstruction. 3. Mild splenomegaly. 4. Unchanged chronic findings include calcified gallstone, atrophic kidneys consistent with chronic renal disease, and advanced Aortic Atherosclerosis (ICD10-I70.0). Electronically Signed   By: MJeb LeveringM.D.   On: 01/11/2018 23:04   [place]  Result Date: 01/13/2018 INDICATION: 67year old with recurrent ascites. EXAM: ULTRASOUND GUIDED  PARACENTESIS MEDICATIONS: None. COMPLICATIONS: None immediate. PROCEDURE: Informed written consent was obtained from the patient after a discussion of the risks, benefits and alternatives to treatment. A timeout was performed prior to the initiation of the procedure. Initial ultrasound scanning demonstrates a large amount of ascites within the left lower abdominal quadrant. The left lower abdomen was prepped and draped in the usual sterile fashion. 1% lidocaine was used for local anesthesia. Following this, a 6 Fr Safe-T-Centesis catheter was introduced. An ultrasound image was saved for  documentation purposes. The paracentesis was performed. The catheter was removed and a dressing was applied. The patient tolerated the procedure well without immediate post procedural complication. FINDINGS: A total of approximately 5 L of  dark yellow fluid was removed. IMPRESSION: Successful ultrasound-guided paracentesis yielding 5 liters of peritoneal fluid. Electronically Signed   By: Markus Daft M.D.   On: 01/13/2018 12:46   Dg Chest Portable 1 View  Result Date: 01/11/2018 CLINICAL DATA:  Weakness, end-stage renal disease on dialysis, hypotensive episode yesterday treated with IV fluids, persistent weakness today EXAM: PORTABLE CHEST 1 VIEW COMPARISON:  Portable exam 2313 hours compared to 11/04/2017 FINDINGS: Enlargement of cardiac silhouette post CABG. Mediastinal contours and pulmonary vascularity normal. Improved pulmonary vascular congestion since previous exam. Minimal RIGHT basilar atelectasis. No definite infiltrate, pleural effusion or pneumothorax. IMPRESSION: Enlargement of cardiac silhouette post CABG. Minimal RIGHT basilar atelectasis. Electronically Signed   By: Lavonia Dana M.D.   On: 01/11/2018 23:41   Dg Foot 2 Views Left  Result Date: 01/10/2018 CLINICAL DATA:  Ulcer to the dorsal surface of the foot. EXAM: LEFT FOOT - 2 VIEW COMPARISON:  None. FINDINGS: Diffuse soft tissue swelling is present over the distal foot and extending into the digits. No underlying erosions are present. Microvascular calcifications are compatible with diabetes. IMPRESSION: Diffuse soft tissue swelling over the dorsum of the foot and into the digits. This may reflect cellulitis. 1. No focal osseous destruction. 2. Microvascular calcifications compatible with the given diagnosis of diabetes. Electronically Signed   By: San Morelle M.D.   On: 01/10/2018 09:03    Assessment/Plan  Hyperlipidemia lipid control important in reducing the progression of atherosclerotic disease. 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  This does clearly contribute to the gangrenous changes and slow his wound healing.  He is clearly a high risk patient.  Type II diabetes mellitus (HCC) blood glucose control important in reducing the progression of atherosclerotic disease. Also, involved in wound healing. On appropriate medications.   Hypertension blood pressure control important in reducing the progression of atherosclerotic disease. On appropriate oral medications.   Atherosclerosis of native arteries of the extremities with gangrene [facility]) The patient has undergone successful revascularization of the left lower extremity but has fixed skin changes and will require at least an extended transmetatarsal amputation.  With the bottom of his foot appearing viable, I am hopeful we will have enough of the flap to get bone coverage and give him a chance to heal the wound had a transmetatarsal level.  He is at extremely high risk for a more proximal amputation and I have tried to stress that he and his family today.  We will plan on a transmetatarsal amputation next week.    [name], MD  01/28/2018 3:53 PM    This note was created with Dragon medical transcription system.  Any errors from dictation are purely unintentional

## 2018-01-28 NOTE — Assessment & Plan Note (Signed)
blood glucose control important in reducing the progression of atherosclerotic disease. Also, involved in wound healing. On appropriate medications.  

## 2018-01-28 NOTE — Assessment & Plan Note (Signed)
The patient has undergone successful revascularization of the left lower extremity but has fixed skin changes and will require at least an extended transmetatarsal amputation.  With the bottom of his foot appearing viable, I am hopeful we will have enough of the flap to get bone coverage and give him a chance to heal the wound had a transmetatarsal level.  He is at extremely high risk for a more proximal amputation and I have tried to stress that he and his family today.  We will plan on a transmetatarsal amputation next week.

## 2018-01-28 NOTE — Assessment & Plan Note (Signed)
blood pressure control important in reducing the progression of atherosclerotic disease. On appropriate oral medications.  

## 2018-01-28 NOTE — Assessment & Plan Note (Signed)
Access is working well.  This does clearly contribute to the gangrenous changes and slow his wound healing.  He is clearly a high risk patient.

## 2018-01-29 ENCOUNTER — Encounter (INDEPENDENT_AMBULATORY_CARE_PROVIDER_SITE_OTHER): Payer: Self-pay

## 2018-01-30 ENCOUNTER — Other Ambulatory Visit (INDEPENDENT_AMBULATORY_CARE_PROVIDER_SITE_OTHER): Payer: Self-pay | Admitting: Nurse Practitioner

## 2018-02-04 ENCOUNTER — Telehealth (INDEPENDENT_AMBULATORY_CARE_PROVIDER_SITE_OTHER): Payer: Self-pay

## 2018-02-04 ENCOUNTER — Inpatient Hospital Stay: Admission: RE | Admit: 2018-02-04 | Payer: Medicare HMO | Source: Ambulatory Visit

## 2018-02-04 NOTE — Telephone Encounter (Signed)
I called the patient's daughter to find out why he missed his surgical pre-op and per the daughter the patient is an in pt at [facility] and his surgery and pre-op needs to be canceled.

## 2018-02-06 ENCOUNTER — Encounter: Admission: RE | Payer: Self-pay | Source: Ambulatory Visit

## 2018-02-06 ENCOUNTER — Ambulatory Visit: Admission: RE | Admit: 2018-02-06 | Payer: Medicare HMO | Source: Ambulatory Visit | Admitting: Vascular Surgery

## 2018-02-06 SURGERY — AMPUTATION, FOOT, TRANSMETATARSAL
Anesthesia: General | Laterality: Left

## 2018-02-12 ENCOUNTER — Encounter (INDEPENDENT_AMBULATORY_CARE_PROVIDER_SITE_OTHER): Payer: Medicare HMO

## 2018-03-10 IMAGING — CR DG CHEST 2V
2 series · 2 of 2 positions shown · non-contrast
Comparison: 04/09/2016

CLINICAL DATA: Generalized weakness.  On dialysis.

EXAM:
CHEST  2 VIEW

[chest pa]
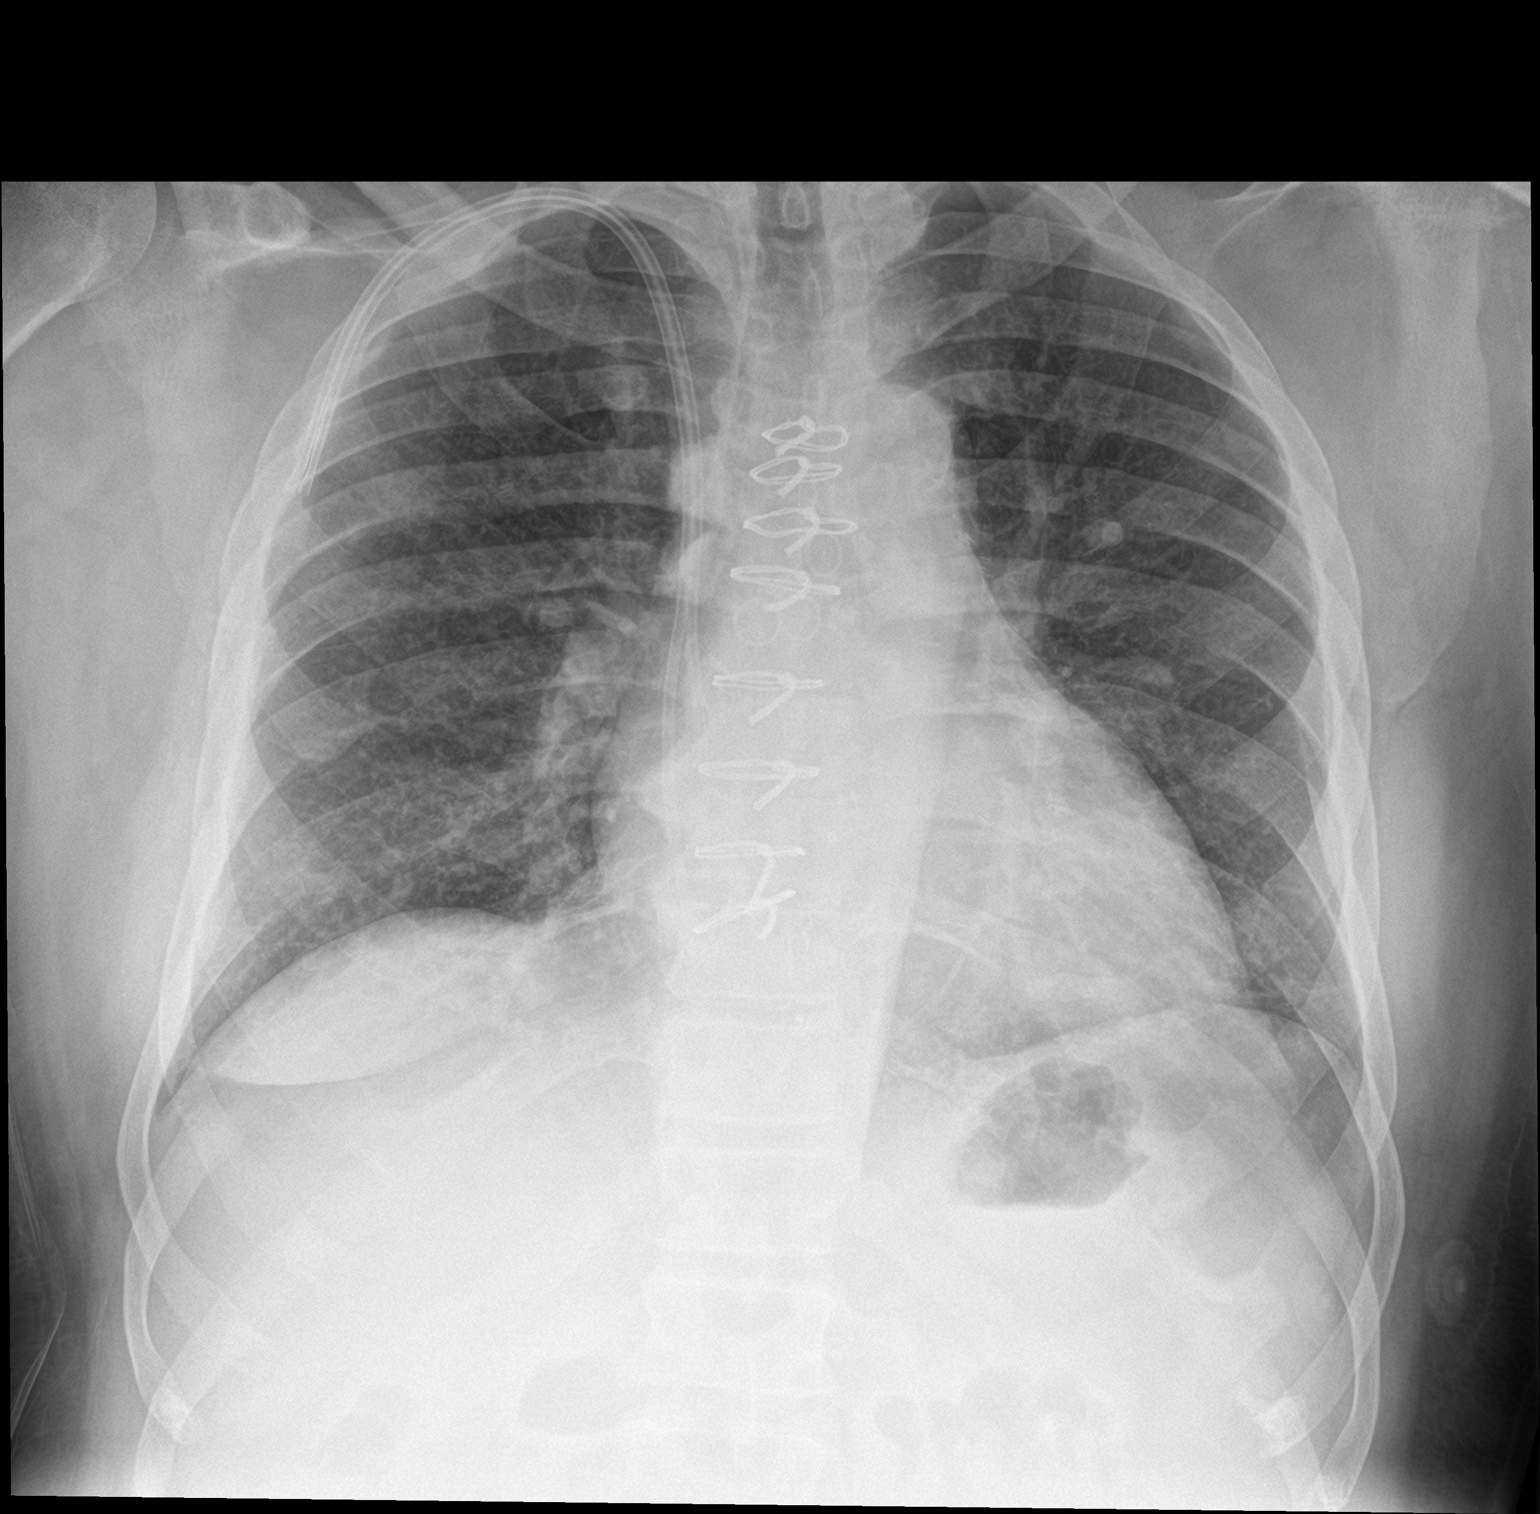

[chest lat]
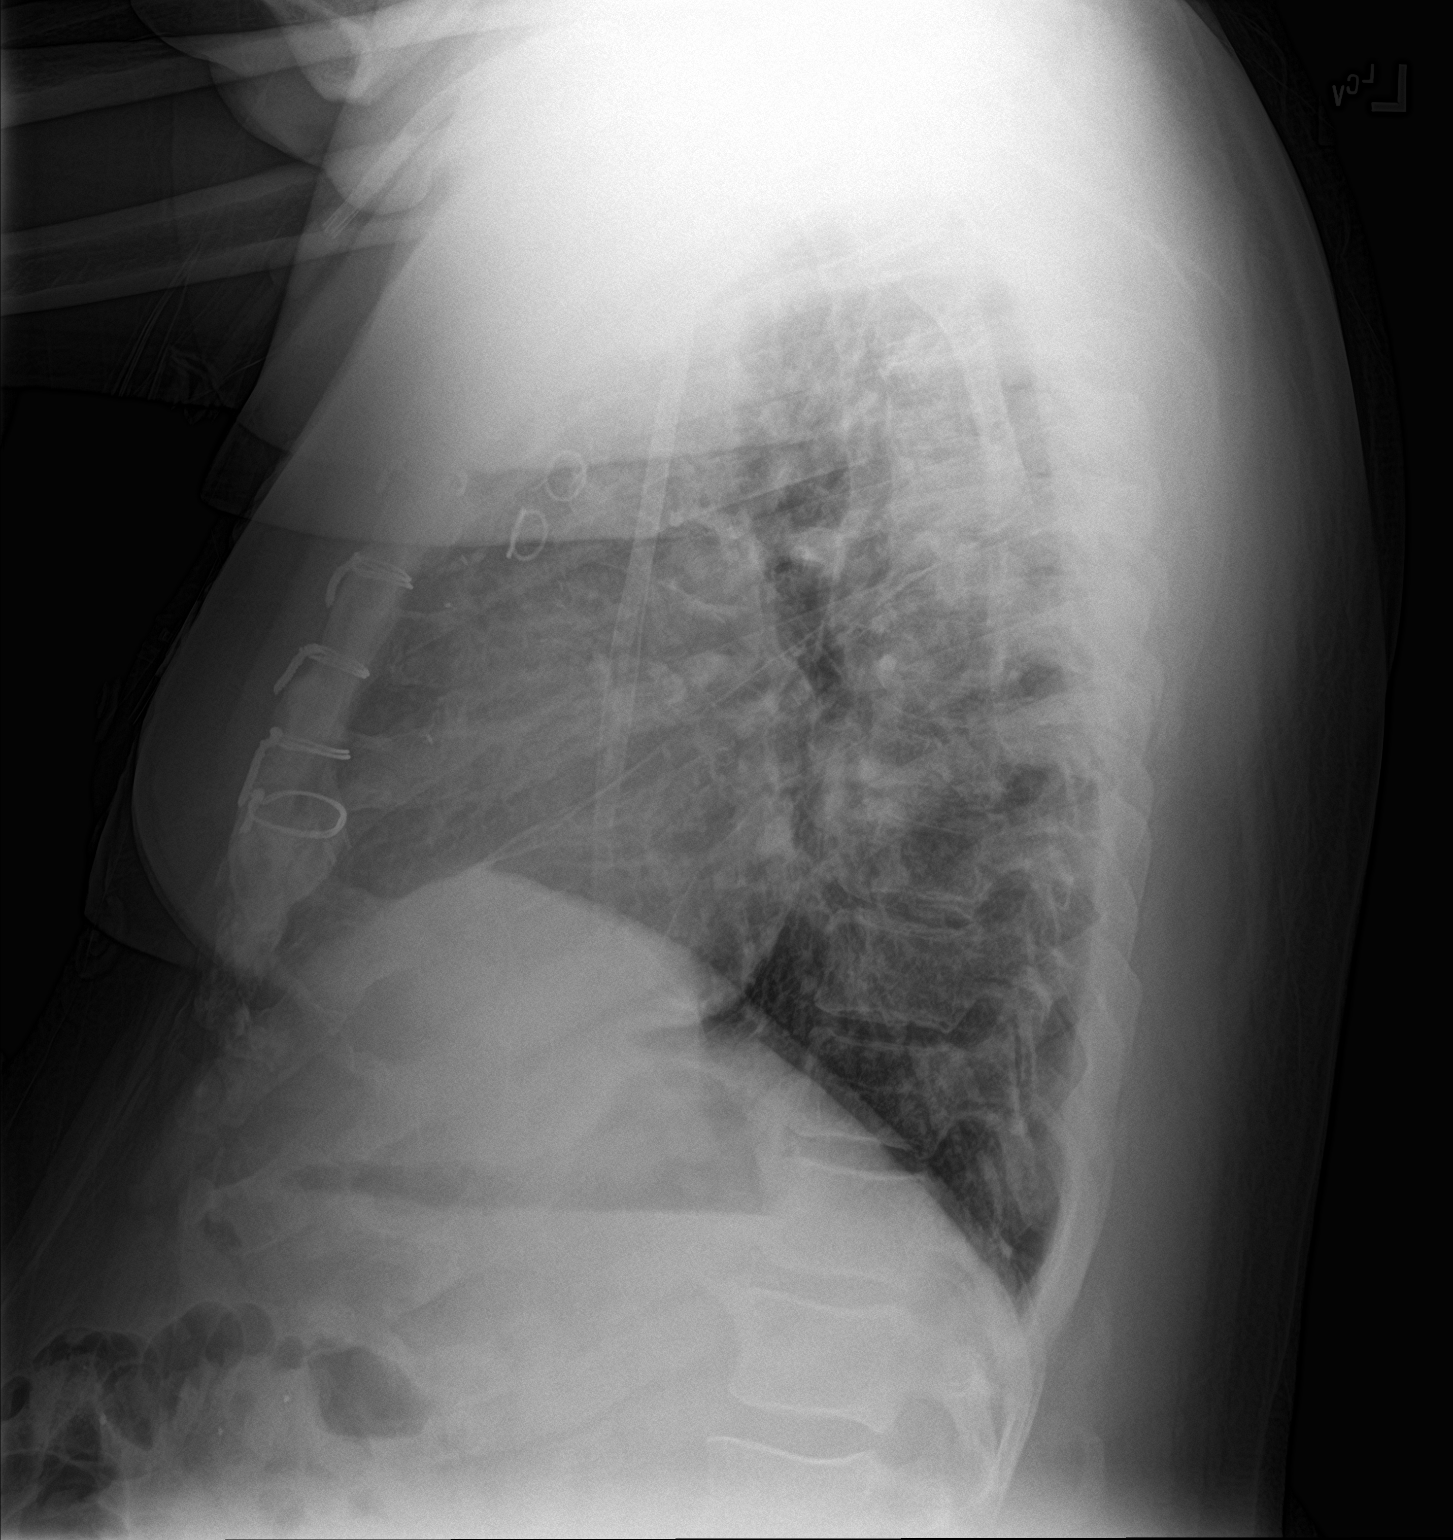

[2 of 2 positions shown; findings below may reference images not displayed]

FINDINGS: Right jugular dialysis catheter terminates over the right atrium.
The cardiac silhouette is mildly enlarged, less prominent than on
the prior study. Sequelae of prior CABG are again identified. There
is mild peribronchial cuffing. No confluent airspace opacity, overt
alveolar edema, pleural effusion, or pneumothorax is identified. No
acute osseous abnormality is seen.
IMPRESSION: Mild cardiomegaly and mild peribronchial cuffing which may reflect
minimal edema.

## 2018-03-11 DEATH — deceased

## 2018-03-13 ENCOUNTER — Encounter

## 2018-03-13 ENCOUNTER — Encounter (INDEPENDENT_AMBULATORY_CARE_PROVIDER_SITE_OTHER): Payer: Medicare HMO

## 2018-03-13 ENCOUNTER — Ambulatory Visit (INDEPENDENT_AMBULATORY_CARE_PROVIDER_SITE_OTHER): Payer: Medicare HMO | Admitting: Vascular Surgery

## 2018-04-24 ENCOUNTER — Ambulatory Visit (INDEPENDENT_AMBULATORY_CARE_PROVIDER_SITE_OTHER): Payer: Medicare HMO | Admitting: Vascular Surgery

## 2018-04-24 ENCOUNTER — Encounter

## 2018-04-24 ENCOUNTER — Encounter (INDEPENDENT_AMBULATORY_CARE_PROVIDER_SITE_OTHER): Payer: Medicare HMO

## 2018-07-17 IMAGING — US US PARACENTESIS
1 series · 8 of 8 positions shown · non-contrast
Comparison: none

CLINICAL DATA: End-stage renal disease, residual/recurrent
symptomatic abdominal ascites

EXAM:
ULTRASOUND GUIDED PARACENTESIS
TECHNIQUE: The procedure, risks (including but not limited to bleeding,
infection, organ damage ), benefits, and alternatives were explained
to the patient. Questions regarding the procedure were encouraged
and answered. The patient understands and consents to the procedure.
Survey ultrasound of the abdomen was performed and an appropriate
skin entry site in the left lower abdomen was selected. Skin site
was marked, prepped with chlorhexadine, draped in usual sterile
fashion, and infiltrated locally with 1% lidocaine. A
Safe-T-Centesis needle was advanced into the peritoneal space until
fluid could be aspirated. The sheath was advanced and the needle
removed. 3.6 L of dark yellow ascites were aspirated.
The patient tolerated the procedure well.
COMPLICATIONS:
COMPLICATIONS
none

[Series 1: us paracentesis · 0.26mm/px · 8 of 8 slices shown]
[im 1/8]
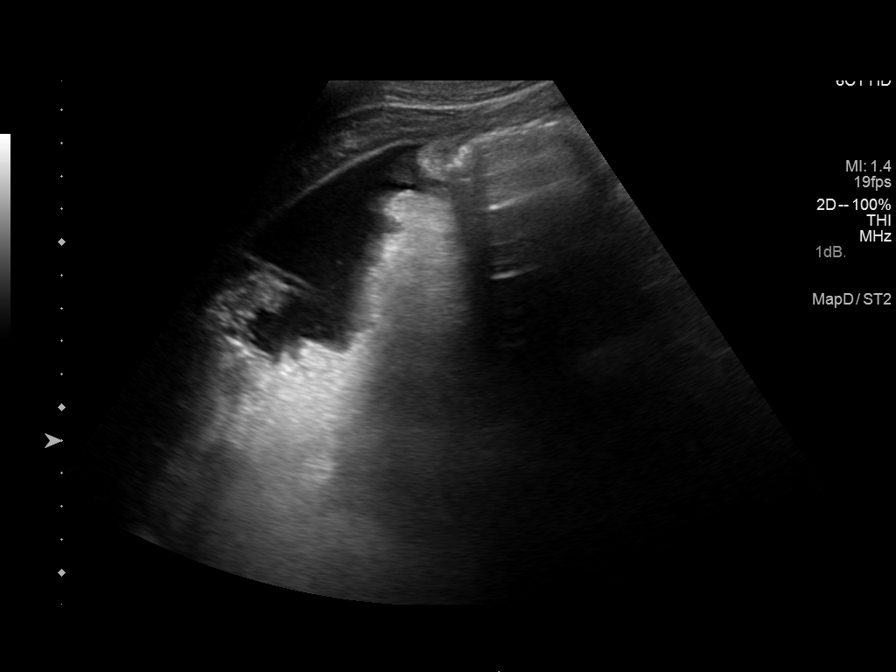
[im 2/8]
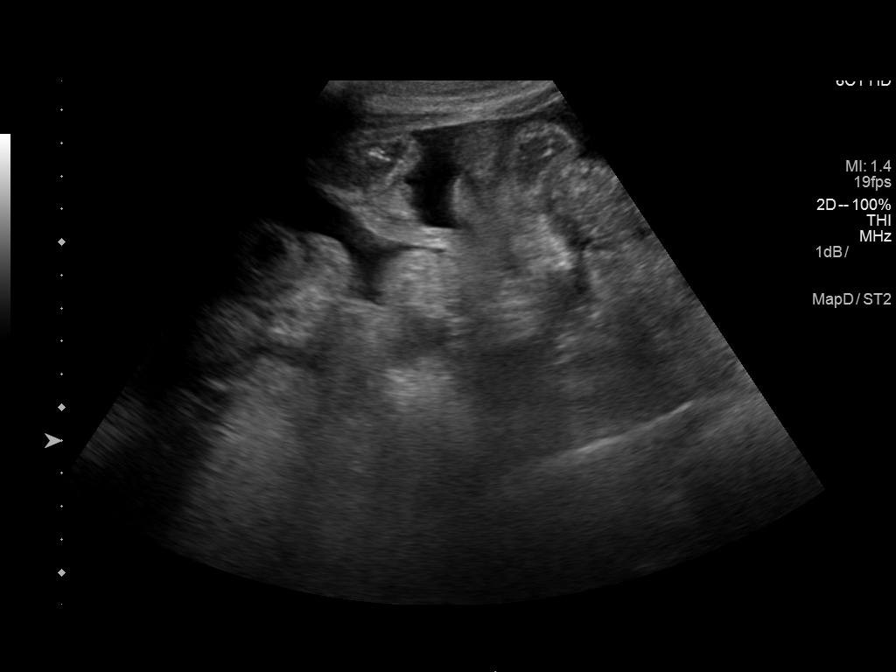
[im 3/8]
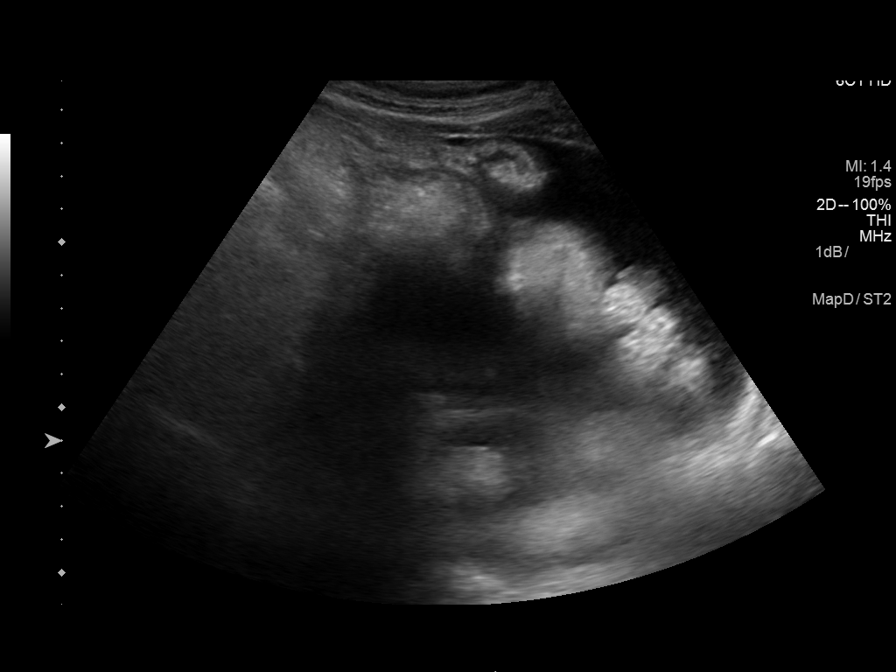
[im 4/8]
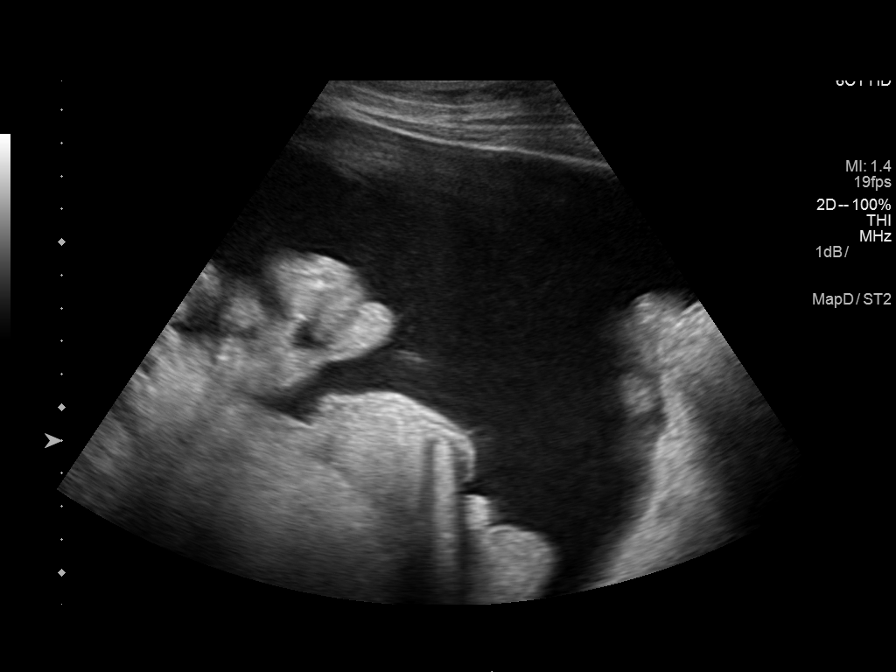
[im 5/8]
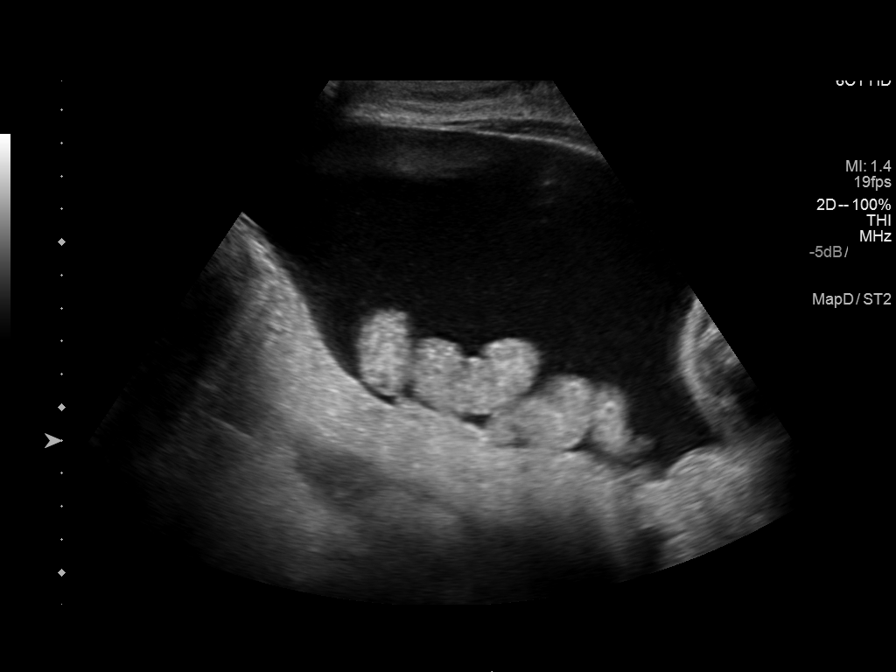
[im 6/8]
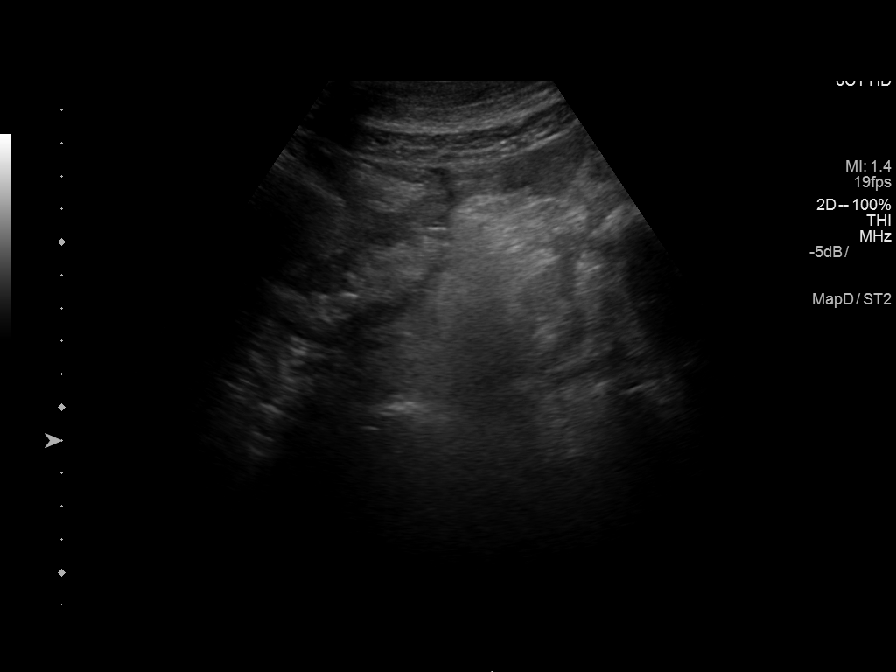
[im 7/8]
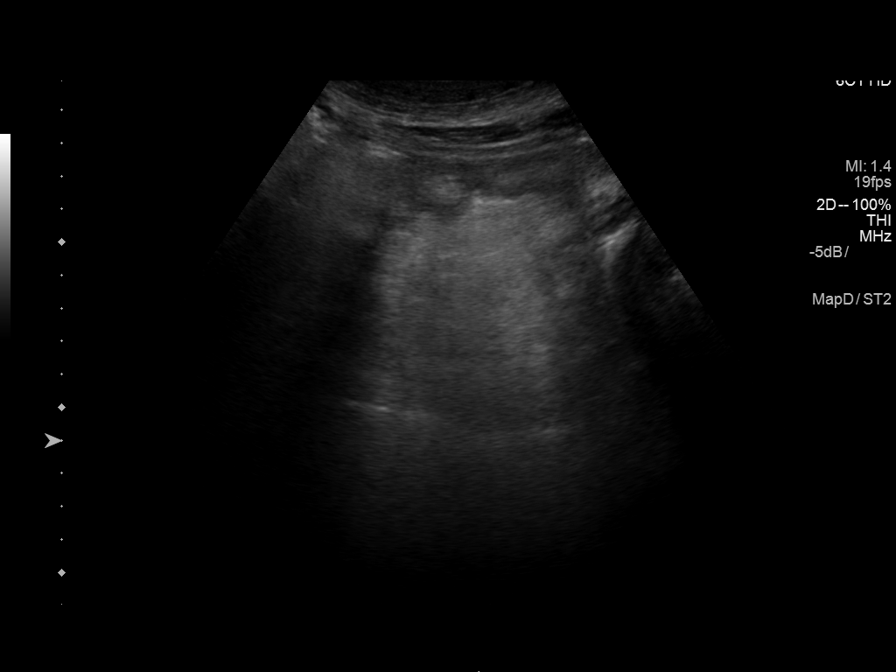
[im 8/8]
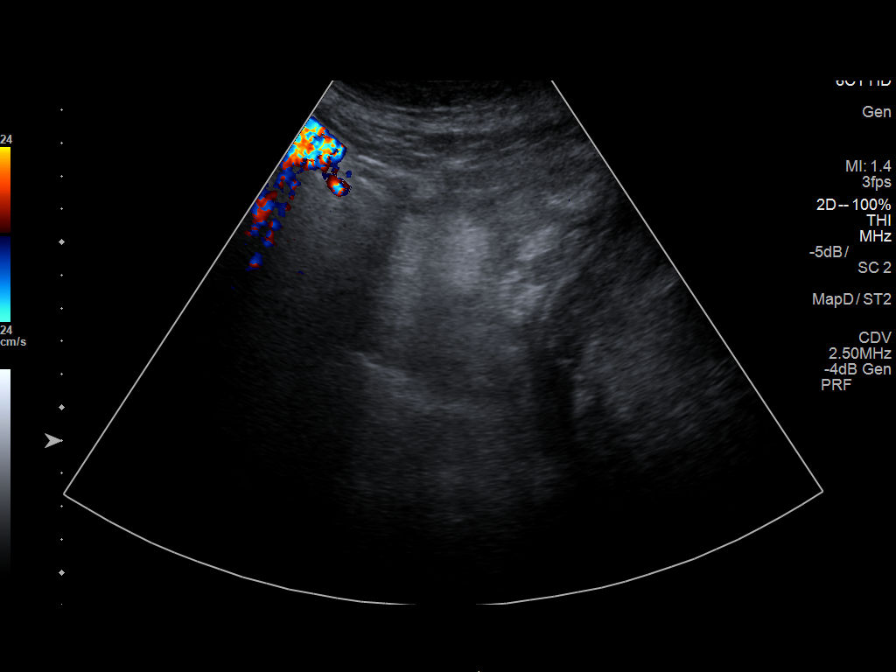

[8 of 8 positions shown; findings below may reference images not displayed]

IMPRESSION: Technically successful ultrasound guided paracentesis, removing
L ascites.

## 2019-05-28 IMAGING — US US CAROTID DUPLEX BILAT
1 series · 13 of 24 positions shown · non-contrast
Comparison: None.

CLINICAL DATA: Hypertension, stroke syncope

EXAM:
BILATERAL CAROTID DUPLEX ULTRASOUND
TECHNIQUE: Gray scale imaging, color Doppler and duplex ultrasound were
performed of bilateral carotid and vertebral arteries in the neck.

[Series 1: us carotid duplex bilat · 13 of 72 slices shown]
[im 1/72]
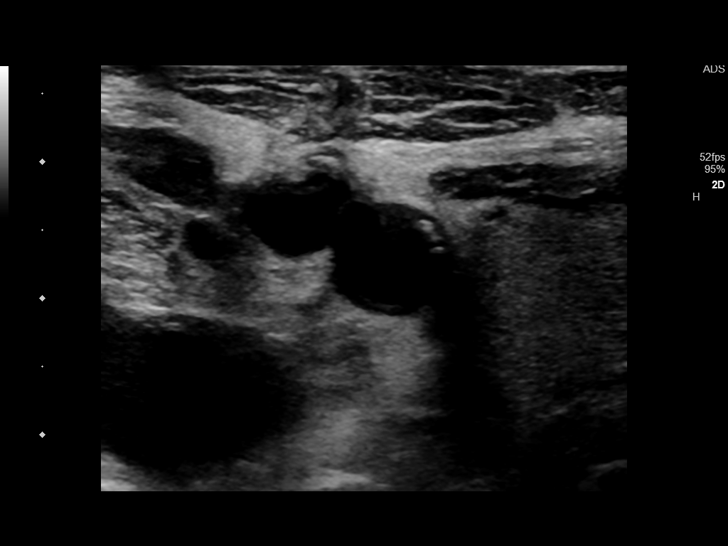
[im 7/72]
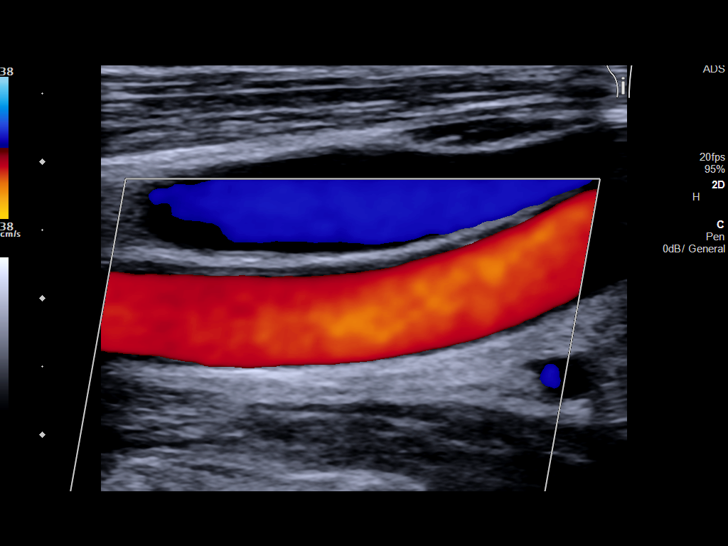
[im 13/72]
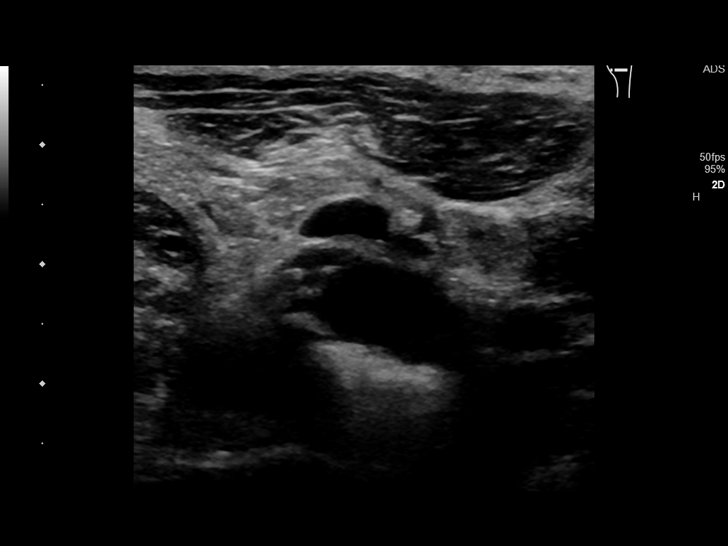
[im 19/72]
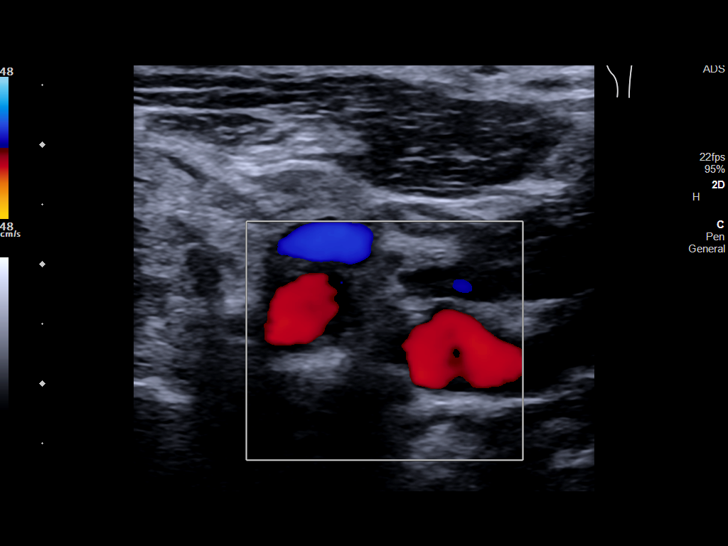
[im 25/72]
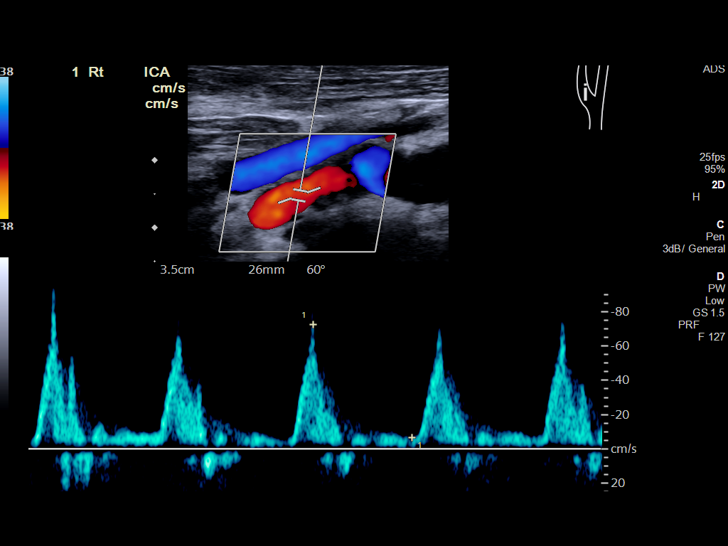
[im 31/72]
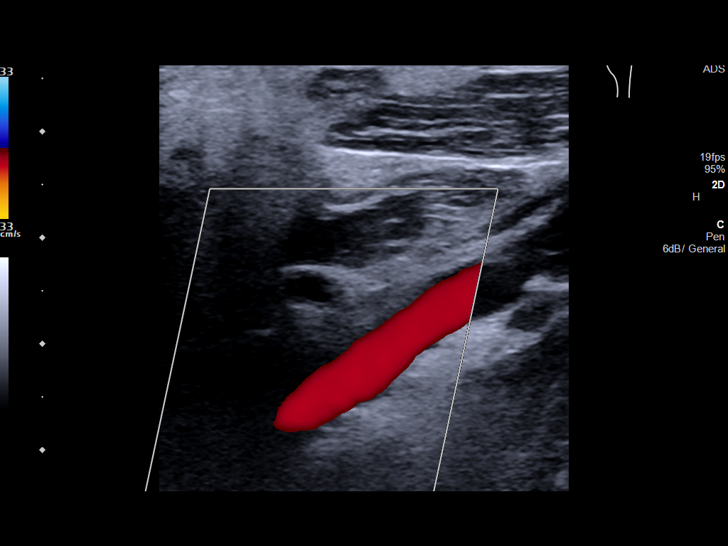
[im 38/72]
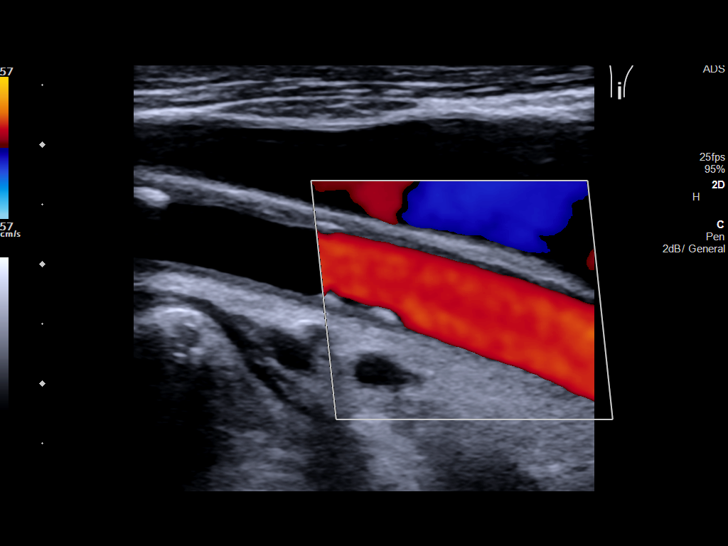
[im 41/72]
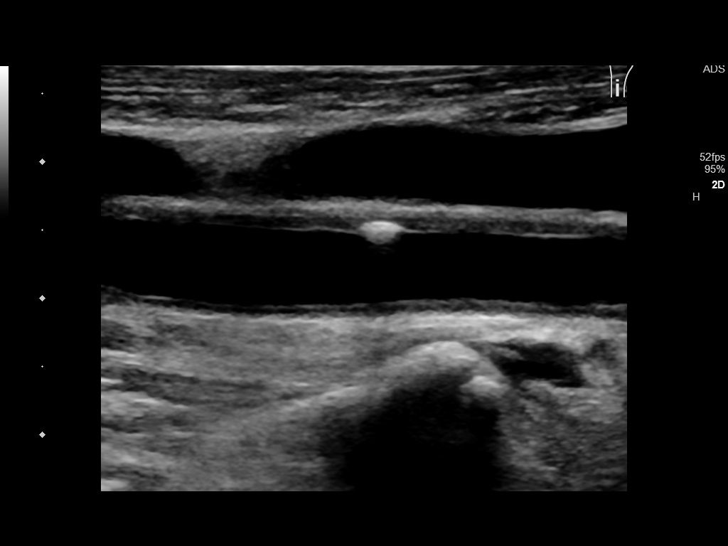
[im 47/72]
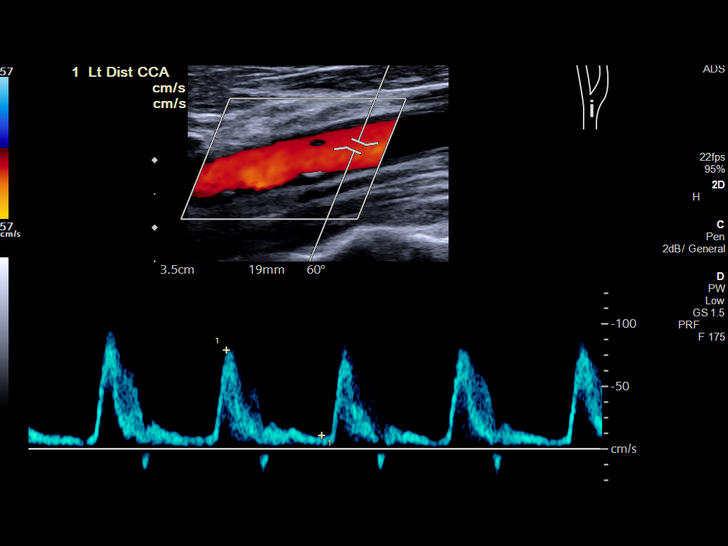
[im 53/72]
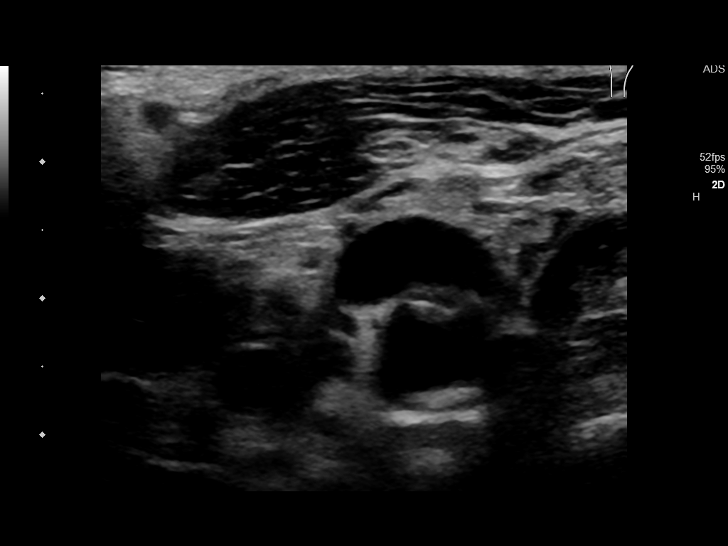
[im 59/72]
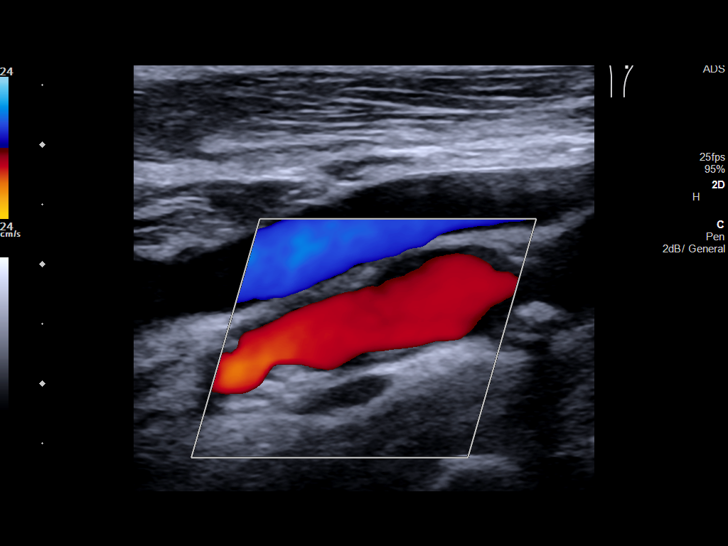
[im 65/72]
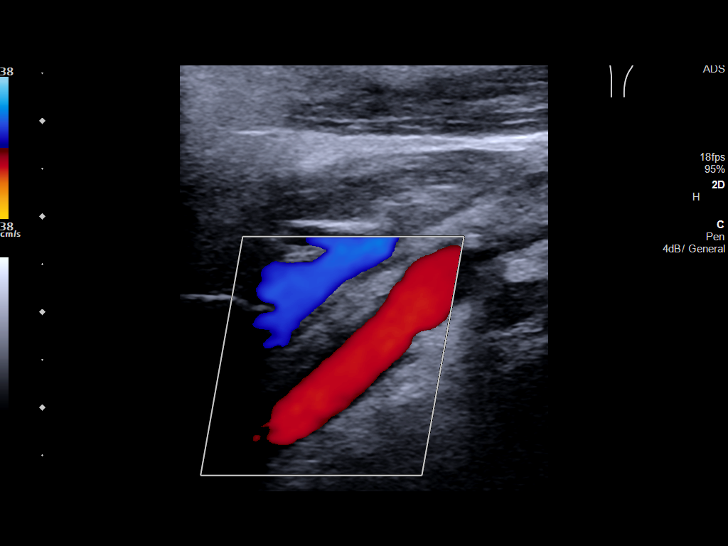
[im 72/72]
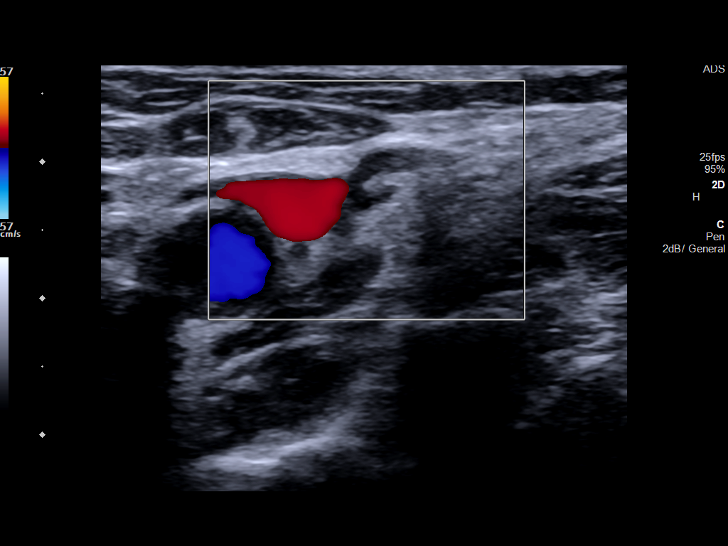

[13 of 24 positions shown; findings below may reference images not displayed]

FINDINGS: Criteria: Quantification of carotid stenosis is based on velocity
parameters that correlate the residual internal carotid diameter
with NASCET-based stenosis levels, using the diameter of the distal
internal carotid lumen as the denominator for stenosis measurement.

The following velocity measurements were obtained:

RIGHT
ICA: 69/12 cm/sec
CCA: 101/11 cm/sec

SYSTOLIC ICA/CCA RATIO:

ECA:  45 cm/sec

LEFT

ICA: 60/14 cm/sec

CCA: 109/10 cm/sec

SYSTOLIC ICA/CCA RATIO:

ECA:  57 cm/sec

RIGHT CAROTID ARTERY: Moderate heterogeneous calcified right carotid
bifurcation atherosclerosis. Despite this, no hemodynamically
significant right ICA stenosis, velocity elevation, turbulent flow.
Degree of narrowing less than 50% bilaterally by ultrasound
criteria.

RIGHT VERTEBRAL ARTERY:  Antegrade

LEFT CAROTID ARTERY: Similar scattered left carotid bifurcation
atherosclerosis. Despite this, no hemodynamically significant left
ICA stenosis, velocity elevation, or turbulent flow. Degree of
narrowing also less than 50%.

LEFT VERTEBRAL ARTERY:  Antegrade
IMPRESSION: Moderate bilateral carotid atherosclerosis. No hemodynamically
significant ICA stenosis. Degree of narrowing less than 50%
bilaterally by ultrasound criteria.

Patent antegrade vertebral flow bilaterally

## 2019-07-15 IMAGING — US US PARACENTESIS
1 series · 5 of 5 positions shown · non-contrast
Comparison: none

INDICATION: 67-year-old with recurrent ascites.

[Series 1: us paracentesis · 5 of 5 slices shown]
[im 1/5]
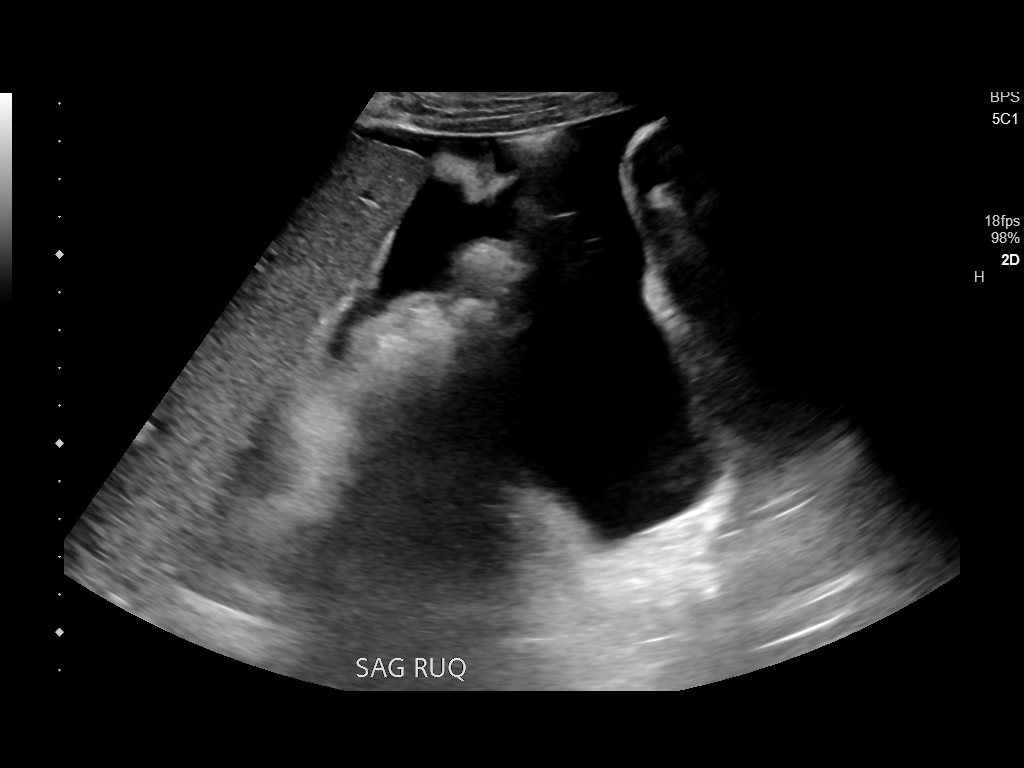
[im 2/5]
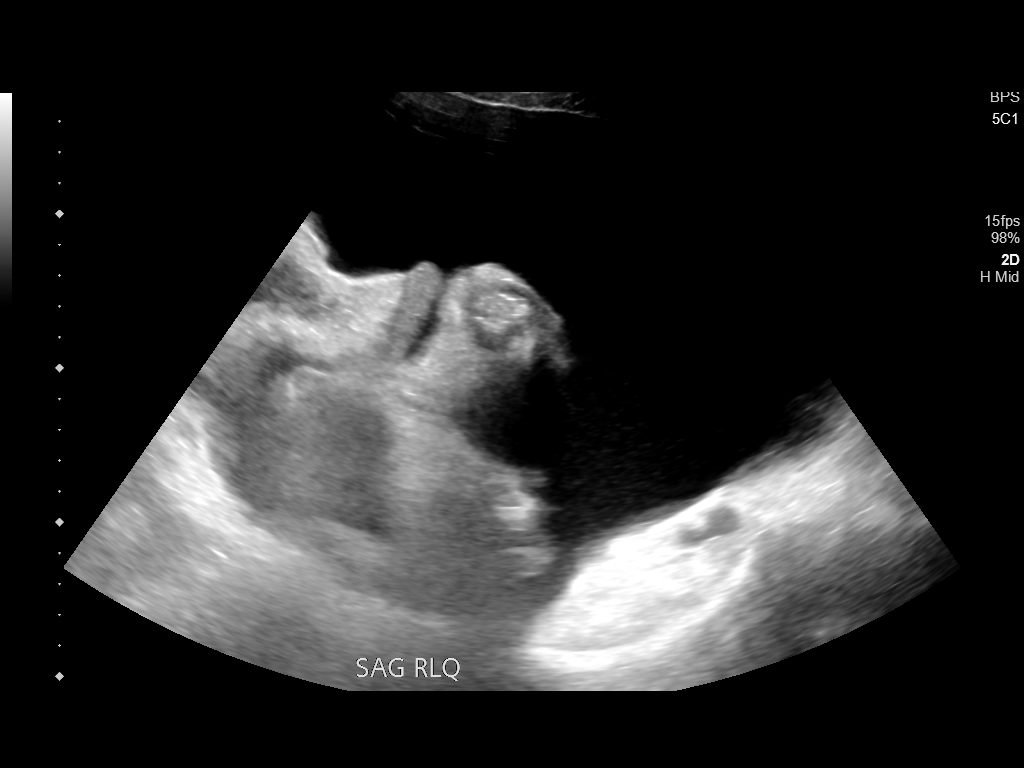
[im 3/5]
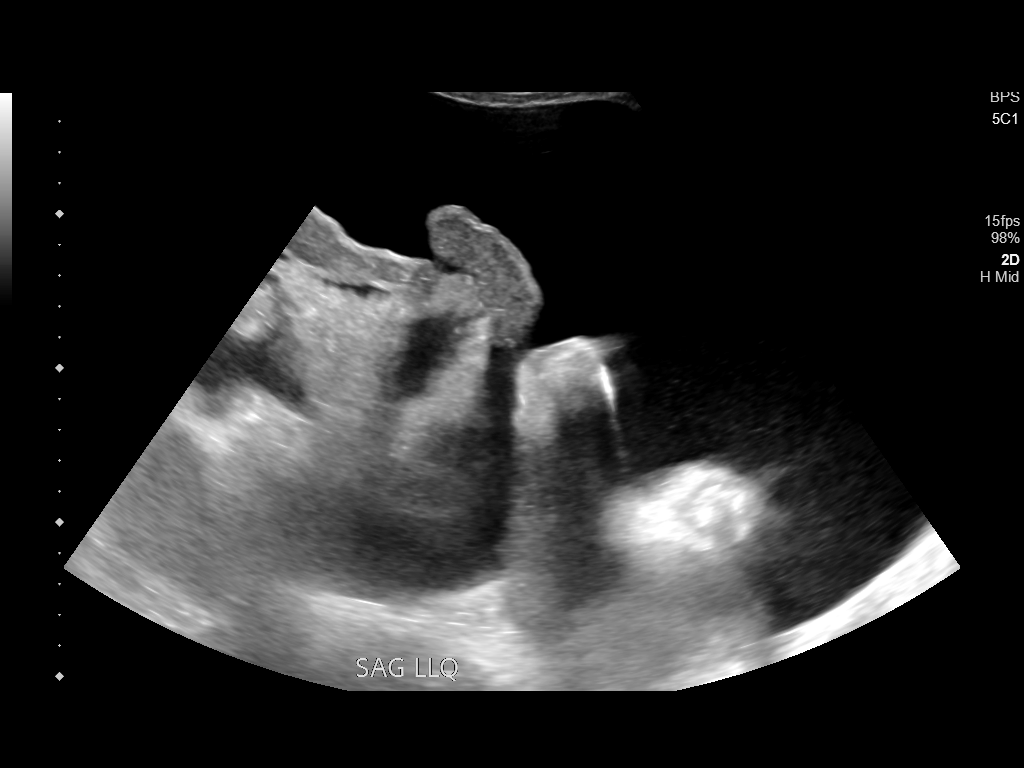
[im 4/5]
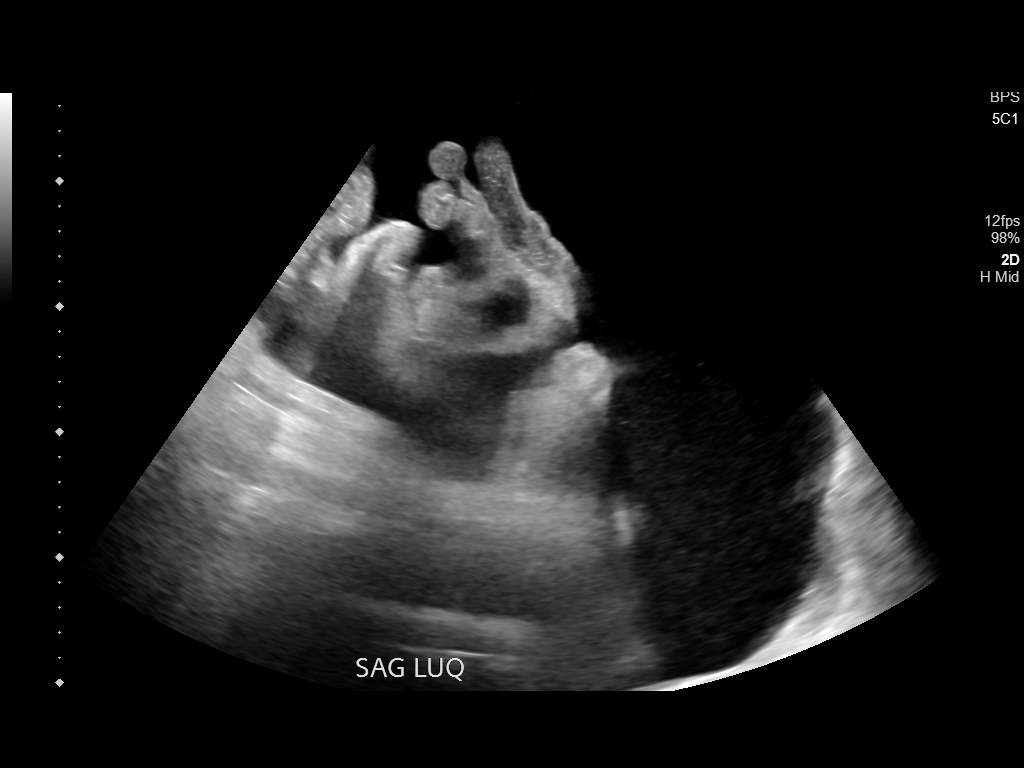
[im 5/5]
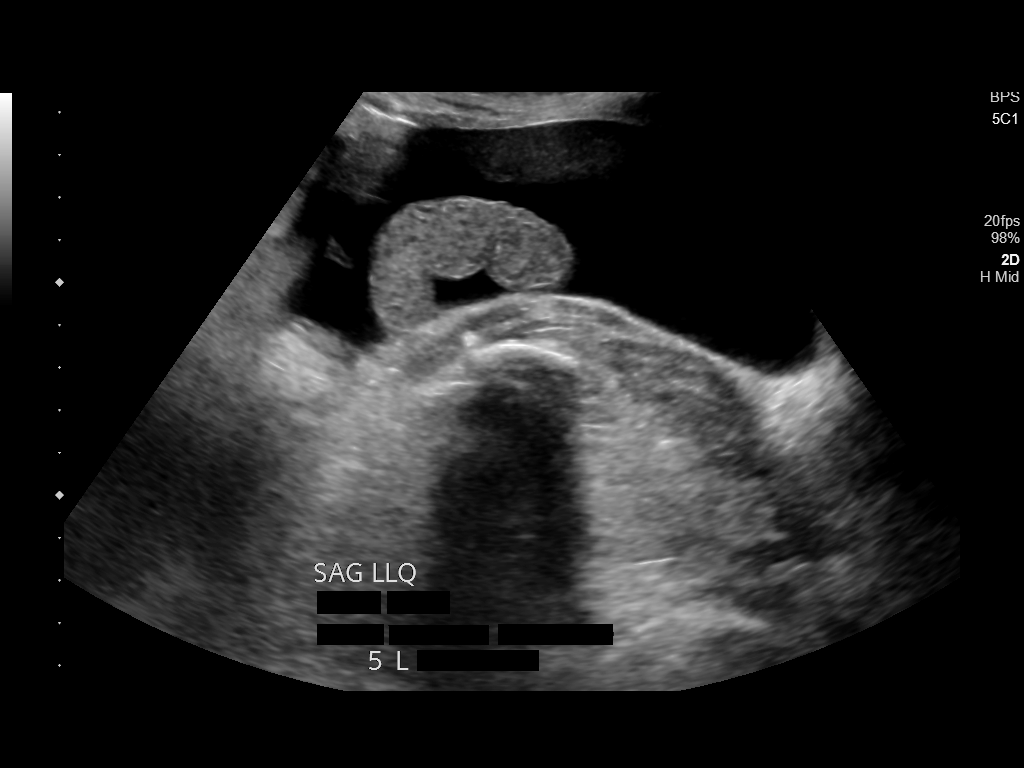

[5 of 5 positions shown; findings below may reference images not displayed]

EXAM:
ULTRASOUND GUIDED  PARACENTESIS

MEDICATIONS:
None.

COMPLICATIONS:
None immediate.

PROCEDURE:
Informed written consent was obtained from the patient after a
discussion of the risks, benefits and alternatives to treatment. A
timeout was performed prior to the initiation of the procedure.

Initial ultrasound scanning demonstrates a large amount of ascites
within the left lower abdominal quadrant. The left lower abdomen was
prepped and draped in the usual sterile fashion. 1% lidocaine was
used for local anesthesia.

Following this, a 6 Fr Safe-T-Centesis catheter was introduced. An
ultrasound image was saved for documentation purposes. The
paracentesis was performed. The catheter was removed and a dressing
was applied. The patient tolerated the procedure well without
immediate post procedural complication.
FINDINGS: A total of approximately 5 L of dark yellow fluid was removed.
IMPRESSION: Successful ultrasound-guided paracentesis yielding 5 liters of
peritoneal fluid.
